# Patient Record
Sex: Male | Born: 1970 | Race: Black or African American | Hispanic: No | Marital: Married | Smoking: Former smoker
Health system: Southern US, Community
[De-identification: ages and names within clinical notes are randomized; demographics above are authoritative.]

## PROBLEM LIST (undated history)

## (undated) DIAGNOSIS — C801 Malignant (primary) neoplasm, unspecified: Secondary | ICD-10-CM

## (undated) DIAGNOSIS — Z9049 Acquired absence of other specified parts of digestive tract: Secondary | ICD-10-CM

## (undated) DIAGNOSIS — F329 Major depressive disorder, single episode, unspecified: Secondary | ICD-10-CM

## (undated) DIAGNOSIS — F32A Depression, unspecified: Secondary | ICD-10-CM

## (undated) DIAGNOSIS — D649 Anemia, unspecified: Secondary | ICD-10-CM

## (undated) DIAGNOSIS — C189 Malignant neoplasm of colon, unspecified: Secondary | ICD-10-CM

## (undated) HISTORY — PX: ABDOMINAL SURGERY: SHX537

## (undated) SURGERY — Surgical Case
Anesthesia: *Unknown

---

## 2002-02-07 ENCOUNTER — Emergency Department (HOSPITAL_COMMUNITY): Admission: EM | Admit: 2002-02-07 | Discharge: 2002-02-08 | Payer: Self-pay | Admitting: Emergency Medicine

## 2002-07-18 ENCOUNTER — Emergency Department (HOSPITAL_COMMUNITY): Admission: EM | Admit: 2002-07-18 | Discharge: 2002-07-19 | Payer: Self-pay

## 2012-06-14 ENCOUNTER — Inpatient Hospital Stay (HOSPITAL_COMMUNITY)
Admission: EM | Admit: 2012-06-14 | Discharge: 2012-06-25 | DRG: 331 | Disposition: A | Discharge status: Home / Self Care | Payer: Medicaid Other | Attending: Internal Medicine | Admitting: Internal Medicine

## 2012-06-14 ENCOUNTER — Emergency Department (HOSPITAL_COMMUNITY): Payer: Medicaid Other

## 2012-06-14 ENCOUNTER — Encounter (HOSPITAL_COMMUNITY): Payer: Self-pay | Admitting: Adult Health

## 2012-06-14 DIAGNOSIS — Z800 Family history of malignant neoplasm of digestive organs: Secondary | ICD-10-CM

## 2012-06-14 DIAGNOSIS — D630 Anemia in neoplastic disease: Secondary | ICD-10-CM | POA: Diagnosis present

## 2012-06-14 DIAGNOSIS — D509 Iron deficiency anemia, unspecified: Secondary | ICD-10-CM | POA: Diagnosis present

## 2012-06-14 DIAGNOSIS — C189 Malignant neoplasm of colon, unspecified: Secondary | ICD-10-CM

## 2012-06-14 DIAGNOSIS — J029 Acute pharyngitis, unspecified: Secondary | ICD-10-CM | POA: Diagnosis not present

## 2012-06-14 DIAGNOSIS — Z720 Tobacco use: Secondary | ICD-10-CM | POA: Diagnosis present

## 2012-06-14 DIAGNOSIS — K6389 Other specified diseases of intestine: Secondary | ICD-10-CM

## 2012-06-14 DIAGNOSIS — R911 Solitary pulmonary nodule: Secondary | ICD-10-CM | POA: Diagnosis present

## 2012-06-14 DIAGNOSIS — C182 Malignant neoplasm of ascending colon: Principal | ICD-10-CM | POA: Diagnosis present

## 2012-06-14 DIAGNOSIS — D649 Anemia, unspecified: Secondary | ICD-10-CM | POA: Diagnosis present

## 2012-06-14 DIAGNOSIS — E876 Hypokalemia: Secondary | ICD-10-CM | POA: Diagnosis not present

## 2012-06-14 DIAGNOSIS — F17200 Nicotine dependence, unspecified, uncomplicated: Secondary | ICD-10-CM | POA: Diagnosis present

## 2012-06-14 DIAGNOSIS — R0609 Other forms of dyspnea: Secondary | ICD-10-CM | POA: Diagnosis present

## 2012-06-14 HISTORY — DX: Anemia, unspecified: D64.9

## 2012-06-14 LAB — URINALYSIS, MICROSCOPIC ONLY
Bilirubin Urine: NEGATIVE
Hgb urine dipstick: NEGATIVE
Ketones, ur: 40 mg/dL — AB
Nitrite: NEGATIVE
Protein, ur: NEGATIVE mg/dL
Specific Gravity, Urine: 1.027 (ref 1.005–1.030)
Urobilinogen, UA: 1 mg/dL (ref 0.0–1.0)

## 2012-06-14 LAB — CBC WITH DIFFERENTIAL/PLATELET
Basophils Absolute: 0 10*3/uL (ref 0.0–0.1)
Eosinophils Absolute: 0.5 10*3/uL (ref 0.0–0.7)
HCT: 34.6 % — ABNORMAL LOW (ref 39.0–52.0)
Lymphs Abs: 1.6 10*3/uL (ref 0.7–4.0)
MCH: 20 pg — ABNORMAL LOW (ref 26.0–34.0)
MCHC: 30.6 g/dL (ref 30.0–36.0)
MCV: 65.3 fL — ABNORMAL LOW (ref 78.0–100.0)
Monocytes Absolute: 1 10*3/uL (ref 0.1–1.0)
Neutro Abs: 4.3 10*3/uL (ref 1.7–7.7)
Platelets: 438 10*3/uL — ABNORMAL HIGH (ref 150–400)
RDW: 17.3 % — ABNORMAL HIGH (ref 11.5–15.5)
WBC: 7.4 10*3/uL (ref 4.0–10.5)

## 2012-06-14 LAB — COMPREHENSIVE METABOLIC PANEL
ALT: 14 U/L (ref 0–53)
Albumin: 3.5 g/dL (ref 3.5–5.2)
Alkaline Phosphatase: 81 U/L (ref 39–117)
BUN: 12 mg/dL (ref 6–23)
Chloride: 98 mEq/L (ref 96–112)
Potassium: 3.5 mEq/L (ref 3.5–5.1)
Sodium: 135 mEq/L (ref 135–145)
Total Bilirubin: 0.3 mg/dL (ref 0.3–1.2)
Total Protein: 8 g/dL (ref 6.0–8.3)

## 2012-06-14 MED ORDER — ONDANSETRON HCL 4 MG/2ML IJ SOLN
4.0000 mg | Freq: Once | INTRAMUSCULAR | Status: AC
  Administered 2012-06-14: 4 mg via INTRAVENOUS
  Filled 2012-06-14: qty 2

## 2012-06-14 MED ORDER — SODIUM CHLORIDE 0.9 % IV SOLN
1000.0000 mL | Freq: Once | INTRAVENOUS | Status: AC
  Administered 2012-06-14: 1000 mL via INTRAVENOUS

## 2012-06-14 MED ORDER — IOHEXOL 300 MG/ML  SOLN
100.0000 mL | Freq: Once | INTRAMUSCULAR | Status: AC | PRN
  Administered 2012-06-14: 100 mL via INTRAVENOUS

## 2012-06-14 MED ORDER — HYDROMORPHONE HCL PF 1 MG/ML IJ SOLN
1.0000 mg | INTRAMUSCULAR | Status: DC | PRN
  Administered 2012-06-14: 1 mg via INTRAVENOUS
  Filled 2012-06-14: qty 1

## 2012-06-14 MED ORDER — SODIUM CHLORIDE 0.9 % IV SOLN
1000.0000 mL | INTRAVENOUS | Status: DC
  Administered 2012-06-14 – 2012-06-17 (×5): 1000 mL via INTRAVENOUS

## 2012-06-14 NOTE — ED Notes (Signed)
Returned from CT.

## 2012-06-14 NOTE — ED Provider Notes (Signed)
History     CSN: [id number] Arrival date & time 06/14/12  1911 First MD Initiated Contact with Patient 06/14/12 2000     Chief Complaint  Patient presents with  . Flank Pain    Patient is a 41 y.o. male presenting with flank pain. The history is provided by the patient.  Flank Pain This is a recurrent problem. Episode onset: one year ago. The problem occurs daily. The problem has been gradually worsening. Associated symptoms include abdominal pain. Pertinent negatives include no chest pain, no headaches and no shortness of breath. The symptoms are aggravated by eating (pasta, fried foods). Nothing relieves the symptoms. He has tried ASA for the symptoms. The treatment provided mild relief.  Pt has noticed decreased caliber of his stools and constipation.  He has not seen anyone for this problem.  History reviewed. No pertinent past medical history.  History reviewed. No pertinent past surgical history.  History reviewed. No pertinent family history.  History  Substance Use Topics  . Smoking status: Current Every Day Smoker  . Smokeless tobacco: Not on file  . Alcohol Use: No      Review of Systems  Respiratory: Negative for shortness of breath.   Cardiovascular: Negative for chest pain.  Gastrointestinal: Positive for abdominal pain and constipation. Negative for vomiting and diarrhea.  Genitourinary: Positive for flank pain.  Neurological: Negative for headaches.    Allergies  Review of patient's allergies indicates no known allergies.  Home Medications  No current outpatient prescriptions on file.  BP 122/82  Pulse 107  Temp 98.6 F (37 C) (Oral)  Resp 16  SpO2 99%  Physical Exam  Nursing note and vitals reviewed. Constitutional: He appears well-developed and well-nourished. No distress.  HENT:  Head: Normocephalic and atraumatic.  Right Ear: External ear normal.  Left Ear: External ear normal.  Eyes: Conjunctivae normal are normal. Right eye exhibits no  discharge. Left eye exhibits no discharge. No scleral icterus.  Neck: Neck supple. No tracheal deviation present.  Cardiovascular: Normal rate, regular rhythm and intact distal pulses.   Pulmonary/Chest: Effort normal and breath sounds normal. No stridor. No respiratory distress. He has no wheezes. He has no rales.  Abdominal: Soft. Bowel sounds are normal. He exhibits no distension, no ascites and no mass. There is tenderness. There is guarding. There is no rigidity and no rebound.  Musculoskeletal: He exhibits no edema and no tenderness.  Neurological: He is alert. He has normal strength. No sensory deficit. Cranial nerve deficit:  no gross defecits noted. He exhibits normal muscle tone. He displays no seizure activity. Coordination normal.  Skin: Skin is warm and dry. No rash noted.  Psychiatric: He has a normal mood and affect.    ED Course  Procedures (including critical care time)  Labs Reviewed  CBC WITH DIFFERENTIAL - Abnormal; Notable for the following:    Hemoglobin 10.6 (*)     HCT 34.6 (*)     MCV 65.3 (*)     MCH 20.0 (*)     RDW 17.3 (*)     Platelets 438 (*)     Monocytes Relative 13 (*)     Eosinophils Relative 7 (*)     All other components within normal limits  URINALYSIS, MICROSCOPIC ONLY - Abnormal; Notable for the following:    APPearance TURBID (*)     Ketones, ur 40 (*)     All other components within normal limits  LIPASE, BLOOD  COMPREHENSIVE METABOLIC PANEL   Ct Abdomen  Pelvis W Contrast  06/14/2012  *RADIOLOGY REPORT*  Clinical Data: Right abdominal pain  CT ABDOMEN AND PELVIS WITH CONTRAST  Technique:  Multidetector CT imaging of the abdomen and pelvis was performed following the standard protocol during bolus administration of intravenous contrast.  Contrast: OMNIPAQUE IOHEXOL 300 MG/ML  SOLN  Comparison: None.  Findings: Linear scarring/atelectasis in the bilateral lower lobes.  11 mm probable cyst in the posterior segment right hepatic lobe  (series 2/image 28).  Spleen, pancreas, and adrenal glands are within normal limits.  Gallbladder is unremarkable.  No intrahepatic or extrahepatic ductal dilatation.  Kidneys are within normal limits.  No hydronephrosis.  No evidence of bowel obstruction.  7.8 x 5.6 cm right colonic mass (series 2/image 43).  Suspected local extension into the surrounding pericolonic fat.  Associated matted ileocolic lymphadenopathy measuring up to 3.8 x 6.3 cm (series 2/image 47).  No evidence of abdominal aortic aneurysm.  No abdominopelvic ascites.  Prostate is top normal in size and indents the base of the bladder.  Bladder is unremarkable.  Mild degenerative changes of the visualized thoracolumbar spine.  IMPRESSION: 7.8 x 5.6 cm right colonic mass, compatible with primary colonic adenocarcinoma.  Suspected local extension into the surrounding pericolonic fat.  Associated ileocolic lymphadenopathy measuring up to 3.8 x 6.3 cm.  These results were called by telephone on 06/14/2012 at 1144 hrs to Dr. Linwood Dibbles, who verbally acknowledged these results.   Original Report Authenticated By: Charline Bills, M.D.      1. Colon cancer       MDM  Pt with unfortunate finding of colonic mass most likely colon ca.  He has no outpatient follow up.  Will admit for observation to arrange for pain management and further workup treatment of his probable colon ca.        Celene Kras, MD 06/14/12 [phone]

## 2012-06-14 NOTE — ED Notes (Signed)
CT notified pt finished drinking contrast  

## 2012-06-14 NOTE — ED Notes (Addendum)
Pt reports one year of right side pain that is located on right flank and around to right side and generalized abdominal pain reports constipation denies nausea, diarrhea. Denies difficulty urinating.  Pain reliever has made the pain better, certain foods make pain worse such as fried foods and pasta.

## 2012-06-15 ENCOUNTER — Observation Stay (HOSPITAL_COMMUNITY): Payer: Medicaid Other

## 2012-06-15 ENCOUNTER — Encounter (HOSPITAL_COMMUNITY): Payer: Self-pay | Admitting: Internal Medicine

## 2012-06-15 DIAGNOSIS — F17200 Nicotine dependence, unspecified, uncomplicated: Secondary | ICD-10-CM

## 2012-06-15 DIAGNOSIS — C182 Malignant neoplasm of ascending colon: Secondary | ICD-10-CM | POA: Diagnosis present

## 2012-06-15 DIAGNOSIS — D649 Anemia, unspecified: Secondary | ICD-10-CM | POA: Diagnosis present

## 2012-06-15 DIAGNOSIS — C189 Malignant neoplasm of colon, unspecified: Secondary | ICD-10-CM

## 2012-06-15 DIAGNOSIS — Z720 Tobacco use: Secondary | ICD-10-CM | POA: Diagnosis present

## 2012-06-15 DIAGNOSIS — K6389 Other specified diseases of intestine: Secondary | ICD-10-CM

## 2012-06-15 DIAGNOSIS — I428 Other cardiomyopathies: Secondary | ICD-10-CM

## 2012-06-15 DIAGNOSIS — R0609 Other forms of dyspnea: Secondary | ICD-10-CM | POA: Diagnosis present

## 2012-06-15 LAB — CEA: CEA: 77.4 ng/mL — ABNORMAL HIGH (ref 0.0–5.0)

## 2012-06-15 LAB — OCCULT BLOOD X 1 CARD TO LAB, STOOL
Fecal Occult Bld: NEGATIVE
Fecal Occult Bld: NEGATIVE

## 2012-06-15 LAB — COMPREHENSIVE METABOLIC PANEL
Albumin: 2.9 g/dL — ABNORMAL LOW (ref 3.5–5.2)
BUN: 10 mg/dL (ref 6–23)
Chloride: 102 mEq/L (ref 96–112)
Creatinine, Ser: 0.9 mg/dL (ref 0.50–1.35)
GFR calc Af Amer: >90 mL/min (ref >90)
GFR calc non Af Amer: >90 mL/min (ref >90)
Glucose, Bld: 89 mg/dL (ref 70–99)
Total Bilirubin: 0.3 mg/dL (ref 0.3–1.2)

## 2012-06-15 LAB — PHOSPHORUS: Phosphorus: 3.5 mg/dL (ref 2.3–4.6)

## 2012-06-15 LAB — RETICULOCYTES: Retic Count, Absolute: 39.5 10*3/uL (ref 19.0–186.0)

## 2012-06-15 LAB — CBC
MCV: 65 fL — ABNORMAL LOW (ref 78.0–100.0)
Platelets: 443 10*3/uL — ABNORMAL HIGH (ref 150–400)
RBC: 4.94 MIL/uL (ref 4.22–5.81)
RDW: 17.4 % — ABNORMAL HIGH (ref 11.5–15.5)
WBC: 7 10*3/uL (ref 4.0–10.5)

## 2012-06-15 LAB — IRON AND TIBC
Iron: 13 ug/dL — ABNORMAL LOW (ref 42–135)
TIBC: 273 ug/dL (ref 215–435)

## 2012-06-15 LAB — FOLATE: Folate: 19.9 ng/mL

## 2012-06-15 LAB — D-DIMER, QUANTITATIVE: D-Dimer, Quant: 2.51 ug/mL-FEU — ABNORMAL HIGH (ref 0.00–0.48)

## 2012-06-15 LAB — VITAMIN B12: Vitamin B-12: 524 pg/mL (ref 211–911)

## 2012-06-15 MED ORDER — ENOXAPARIN SODIUM 40 MG/0.4ML ~~LOC~~ SOLN
40.0000 mg | SUBCUTANEOUS | Status: DC
  Filled 2012-06-15 (×6): qty 0.4

## 2012-06-15 MED ORDER — IOHEXOL 350 MG/ML SOLN
80.0000 mL | Freq: Once | INTRAVENOUS | Status: AC | PRN
  Administered 2012-06-15: 80 mL via INTRAVENOUS

## 2012-06-15 MED ORDER — PEG 3350-KCL-NA BICARB-NACL 420 G PO SOLR
4000.0000 mL | Freq: Once | ORAL | Status: AC
  Administered 2012-06-15: 4000 mL via ORAL
  Filled 2012-06-15: qty 4000

## 2012-06-15 MED ORDER — ONDANSETRON HCL 4 MG/2ML IJ SOLN: 4.0000 mg | Freq: Three times a day (TID) | INTRAMUSCULAR | Status: AC | PRN

## 2012-06-15 MED ORDER — HYDROMORPHONE HCL PF 1 MG/ML IJ SOLN
1.0000 mg | INTRAMUSCULAR | Status: DC | PRN
Start: 2012-06-15 — End: 2012-06-15
  Administered 2012-06-15 (×2): 1 mg via INTRAVENOUS
  Filled 2012-06-15 (×2): qty 1

## 2012-06-15 MED ORDER — ONDANSETRON HCL 4 MG/2ML IJ SOLN: 4.0000 mg | Freq: Four times a day (QID) | INTRAMUSCULAR | Status: DC | PRN

## 2012-06-15 MED ORDER — HYDROCODONE-ACETAMINOPHEN 5-325 MG PO TABS
1.0000 | ORAL_TABLET | ORAL | Status: DC | PRN
  Administered 2012-06-15 – 2012-06-16 (×4): 1 via ORAL
  Administered 2012-06-18: 2 via ORAL
  Filled 2012-06-15 (×2): qty 1
  Filled 2012-06-15: qty 2
  Filled 2012-06-15: qty 1
  Filled 2012-06-15: qty 2

## 2012-06-15 MED ORDER — DOCUSATE SODIUM 100 MG PO CAPS
100.0000 mg | ORAL_CAPSULE | Freq: Two times a day (BID) | ORAL | Status: DC
  Administered 2012-06-15 – 2012-06-16 (×3): 100 mg via ORAL
  Filled 2012-06-15 (×4): qty 1

## 2012-06-15 MED ORDER — ZOLPIDEM TARTRATE 5 MG PO TABS
5.0000 mg | ORAL_TABLET | Freq: Every evening | ORAL | Status: DC | PRN
  Administered 2012-06-18 (×2): 5 mg via ORAL
  Filled 2012-06-15 (×2): qty 1

## 2012-06-15 MED ORDER — HYDROMORPHONE HCL PF 1 MG/ML IJ SOLN: 0.5000 mg | INTRAMUSCULAR | Status: DC | PRN

## 2012-06-15 MED ORDER — ONDANSETRON HCL 4 MG PO TABS
4.0000 mg | ORAL_TABLET | Freq: Four times a day (QID) | ORAL | Status: DC | PRN
  Administered 2012-06-15 – 2012-06-16 (×2): 4 mg via ORAL
  Filled 2012-06-15 (×2): qty 1

## 2012-06-15 MED ORDER — PANTOPRAZOLE SODIUM 40 MG PO TBEC
40.0000 mg | DELAYED_RELEASE_TABLET | Freq: Every day | ORAL | Status: DC
  Administered 2012-06-15 – 2012-06-18 (×4): 40 mg via ORAL
  Filled 2012-06-15 (×4): qty 1

## 2012-06-15 MED ORDER — ACETAMINOPHEN 325 MG PO TABS: 650.0000 mg | ORAL_TABLET | Freq: Four times a day (QID) | ORAL | Status: DC | PRN

## 2012-06-15 MED ORDER — NICOTINE 14 MG/24HR TD PT24
14.0000 mg | MEDICATED_PATCH | Freq: Every day | TRANSDERMAL | Status: DC
  Administered 2012-06-15 – 2012-06-25 (×10): 14 mg via TRANSDERMAL
  Filled 2012-06-15 (×11): qty 1

## 2012-06-15 MED ORDER — ACETAMINOPHEN 650 MG RE SUPP: 650.0000 mg | Freq: Four times a day (QID) | RECTAL | Status: DC | PRN

## 2012-06-15 NOTE — Progress Notes (Signed)
  Echocardiogram 2D Echocardiogram has been performed.  Georgian Co 06/15/2012, 2:45 PM

## 2012-06-15 NOTE — H&P (Signed)
PCP:  NONE   Chief Complaint:   Abdominal pain fatigue  HPI: David Conrad is a 41 y.o. male   has no past medical history on file.   Presented with  1 year hx of intermittent abdominal worse after eating. He also noted thinner stools. He has had fatigue and some dyspnea on exertion. No chest pain. NO blood in stool. What brought him into ER was severe fatigue and worsening abdominal pain. He denies any nausea vomiting. Patient's denying any family history of colonic cancer.  Review of Systems:     Pertinent positives include: fatigue, weight loss abdominal pain,  Constitutional:  No weight loss, night sweats, Fevers, chills,  HEENT:  No headaches, Difficulty swallowing,Tooth/dental problems,Sore throat,  No sneezing, itching, ear ache, nasal congestion, post nasal drip,  Cardio-vascular:  No chest pain, Orthopnea, PND, anasarca, dizziness, palpitations.no Bilateral lower extremity swelling  GI:  No heartburn, indigestion,  nausea, vomiting, diarrhea, change in bowel habits, loss of appetite, melena, blood in stool, hematemesis Resp:  no shortness of breath at rest. No dyspnea on exertion, No excess mucus, no productive cough, No non-productive cough, No coughing up of blood.No change in color of mucus.No wheezing. Skin:  no rash or lesions. No jaundice GU:  no dysuria, change in color of urine, no urgency or frequency. No straining to urinate.  No flank pain.  Musculoskeletal:  No joint pain or no joint swelling. No decreased range of motion. No back pain.  Psych:  No change in mood or affect. No depression or anxiety. No memory loss.  Neuro: no localizing neurological complaints, no tingling, no weakness, no double vision, no gait abnormality, no slurred speech, no confusion  Otherwise ROS are negative except for above, 10 systems were reviewed  Past Medical History: History reviewed. No pertinent past medical history. History reviewed. No pertinent past surgical  history.   Medications: Prior to Admission medications   Not on File    Allergies:  No Known Allergies  Social History:  Ambulatory independently   Lives at   Home with 2 young children his wife is currently pregnant with their third.    reports that he has been smoking.  He does not have any smokeless tobacco history on file. He reports that he does not drink alcohol or use illicit drugs.   Family History: family history includes Alcohol abuse in his father.    Physical Exam: Patient Vitals for the past 24 hrs:  BP Temp Temp src Pulse Resp SpO2  06/14/12 2245 144/69 mmHg - - 88  - 99 %  06/14/12 2230 108/63 mmHg - - 82  - 100 %  06/14/12 2215 120/70 mmHg - - 81  - 100 %  06/14/12 2200 106/62 mmHg - - 85  - 100 %  06/14/12 2145 128/84 mmHg - - 91  - 99 %  06/14/12 2130 121/77 mmHg - - 86  - 99 %  06/14/12 2115 124/76 mmHg - - 86  - 99 %  06/14/12 2100 127/69 mmHg - - 84  - 99 %  06/14/12 1936 122/82 mmHg 98.6 F (37 C) Oral 107  16  99 %    1. General:  in No Acute distress but tearful 2. Psychological: Alert and   Oriented 3. Head/ENT:    Dry Mucous Membranes                          Head Non traumatic, neck supple  Normal Dentition 4. SKIN: normal   Skin turgor,  Skin clean Dry and intact no rash 5. Heart: Regular rate and rhythm no Murmur, Rub or gallop 6. Lungs:  no wheezes bibasilar crackles  noted 7. Abdomen: Soft, and right upper quadrant tenderness and some fullness, Non distended 8. Lower extremities: no clubbing, cyanosis, or edema 9. Neurologically Grossly intact, moving all 4 extremities equally 10. MSK: Normal range of motion  body mass index is unknown because there is no height or weight on file.   Labs on Admission:   [facility] 06/14/12 2018  NA 135  K 3.5  CL 98  CO2 26  GLUCOSE 94  BUN 12  CREATININE 0.95  CALCIUM 9.7  MG --  PHOS --    Basename 06/14/12 2018  AST 14  ALT 14  ALKPHOS 81  BILITOT 0.3    PROT 8.0  ALBUMIN 3.5    Basename 06/14/12 2018  LIPASE 17  AMYLASE --    Basename 06/14/12 2018  WBC 7.4  NEUTROABS 4.3  HGB 10.6*  HCT 34.6*  MCV 65.3*  PLT 438*   No results found for this basename: CKTOTAL:3,CKMB:3,CKMBINDEX:3,TROPONINI:3 in the last 72 hours No results found for this basename: TSH,T4TOTAL,FREET3,T3FREE,THYROIDAB in the last 72 hours No results found for this basename: VITAMINB12:2,FOLATE:2,FERRITIN:2,TIBC:2,IRON:2,RETICCTPCT:2 in the last 72 hours No results found for this basename: HGBA1C    CrCl is unknown because there is no height on file for the current visit. ABG No results found for this basename: phart, pco2, po2, hco3, tco2, acidbasedef, o2sat     No results found for this basename: DDIMER     UA no evidence of infection  Cultures: No results found for this basename: sdes, specrequest, cult, reptstatus       Radiological Exams on Admission: Ct Abdomen Pelvis W Contrast  06/14/2012  *RADIOLOGY REPORT*  Clinical Data: Right abdominal pain  CT ABDOMEN AND PELVIS WITH CONTRAST  Technique:  Multidetector CT imaging of the abdomen and pelvis was performed following the standard protocol during bolus administration of intravenous contrast.  Contrast: OMNIPAQUE IOHEXOL 300 MG/ML  SOLN  Comparison: None.  Findings: Linear scarring/atelectasis in the bilateral lower lobes.  11 mm probable cyst in the posterior segment right hepatic lobe (series 2/image 28).  Spleen, pancreas, and adrenal glands are within normal limits.  Gallbladder is unremarkable.  No intrahepatic or extrahepatic ductal dilatation.  Kidneys are within normal limits.  No hydronephrosis.  No evidence of bowel obstruction.  7.8 x 5.6 cm right colonic mass (series 2/image 43).  Suspected local extension into the surrounding pericolonic fat.  Associated matted ileocolic lymphadenopathy measuring up to 3.8 x 6.3 cm (series 2/image 47).  No evidence of abdominal aortic aneurysm.  No  abdominopelvic ascites.  Prostate is top normal in size and indents the base of the bladder.  Bladder is unremarkable.  Mild degenerative changes of the visualized thoracolumbar spine.  IMPRESSION: 7.8 x 5.6 cm right colonic mass, compatible with primary colonic adenocarcinoma.  Suspected local extension into the surrounding pericolonic fat.  Associated ileocolic lymphadenopathy measuring up to 3.8 x 6.3 cm.  These results were called by telephone on 06/14/2012 at 1144 hrs to Dr. Linwood Dibbles, who verbally acknowledged these results.   Original Report Authenticated By: Charline Bills, M.D.     Chart has been reviewed  Assessment/Plan  41 year old gentleman with pneumonia diagnosed likely primary colonic adenocarcinoma by CAT scan is being admitted to father workup.  Present on Admission:  .Colonic mass -  we'll admit for observation would recommend GI consult in a.m. keep clear liquid diet for now. Tissue biopsy would be prudent to obtain.  .Anemia - likely secondary to anemia of chronic disease versus iron deficiency anemia secondary to slow blood loss to obtain anemia panel and Hemoccult stool  .Dyspnea on exertion - possibly secondary to anemia but will also obtain  chest films. Denies any chest pain which would suggest PE. No leg swelling. Patient is not hypoxic or tachycardic.   Prophylaxis:  Lovenox, Protonix  CODE STATUS: FULL CODE  Other plan as per orders.  I have spent a total of 55 min on this admission  David Conrad 06/15/2012, 12:44 AM

## 2012-06-15 NOTE — Consult Note (Signed)
Reason for Consult:Right colon mass on CT. Referring Physician: [facility]-Dr. Ricke Hey  David Conrad is an 41 y.o. male.  HPI: Patient gives a 1 year history of anorexia with abdominal pain, change in bowel habits and weight loss. His symptoms were intermittent at first but worsened over the last few months. He claims he weighs about 220 lbs at his baseline but no one has weighed him since admission. He denies having any melena or hematochezia. CT scan on admission revealed a large right colon mass and lymphadenopathy.    History reviewed. No pertinent past medical history.  History reviewed. No pertinent past surgical history.  Family History  Problem Relation Age of Onset  . Alcohol abuse Father   His paternal grandfather was diagnosed with colon cancer at the age of 76.   Social History:  reports that he has been smoking.  He has never used smokeless tobacco. He reports that he does not drink alcohol or use illicit drugs.  Allergies: No Known Allergies  Medications: I have reviewed the patient's current medications.  Results for orders placed during the hospital encounter of 06/14/12 (from the past 48 hour(s))  LIPASE, BLOOD     Status: Normal   Collection Time   06/14/12  8:18 PM      Component Value Range Comment   Lipase 17  11 - 59 U/L   COMPREHENSIVE METABOLIC PANEL     Status: Normal   Collection Time   06/14/12  8:18 PM      Component Value Range Comment   Sodium 135  135 - 145 mEq/L    Potassium 3.5  3.5 - 5.1 mEq/L    Chloride 98  96 - 112 mEq/L    CO2 26  19 - 32 mEq/L    Glucose, Bld 94  70 - 99 mg/dL    BUN 12  6 - 23 mg/dL    Creatinine, Ser 4.78  0.50 - 1.35 mg/dL    Calcium 9.7  8.4 - 29.5 mg/dL    Total Protein 8.0  6.0 - 8.3 g/dL    Albumin 3.5  3.5 - 5.2 g/dL    AST 14  0 - 37 U/L    ALT 14  0 - 53 U/L    Alkaline Phosphatase 81  39 - 117 U/L    Total Bilirubin 0.3  0.3 - 1.2 mg/dL    GFR calc non Af Amer >90  >90 mL/min    GFR calc Af Amer >90  >90  mL/min   CBC WITH DIFFERENTIAL     Status: Abnormal   Collection Time   06/14/12  8:18 PM      Component Value Range Comment   WBC 7.4  4.0 - 10.5 K/uL    RBC 5.30  4.22 - 5.81 MIL/uL    Hemoglobin 10.6 (*) 13.0 - 17.0 g/dL    HCT 62.1 (*) 30.8 - 52.0 %    MCV 65.3 (*) 78.0 - 100.0 fL    MCH 20.0 (*) 26.0 - 34.0 pg    MCHC 30.6  30.0 - 36.0 g/dL    RDW 65.7 (*) 84.6 - 15.5 %    Platelets 438 (*) 150 - 400 K/uL    Neutrophils Relative 58  43 - 77 %    Lymphocytes Relative 22  12 - 46 %    Monocytes Relative 13 (*) 3 - 12 %    Eosinophils Relative 7 (*) 0 - 5 %    Basophils Relative  0  0 - 1 %    Neutro Abs 4.3  1.7 - 7.7 K/uL    Lymphs Abs 1.6  0.7 - 4.0 K/uL    Monocytes Absolute 1.0  0.1 - 1.0 K/uL    Eosinophils Absolute 0.5  0.0 - 0.7 K/uL    Basophils Absolute 0.0  0.0 - 0.1 K/uL    RBC Morphology POLYCHROMASIA PRESENT     URINALYSIS, MICROSCOPIC ONLY     Status: Abnormal   Collection Time   06/14/12 10:50 PM      Component Value Range Comment   Color, Urine YELLOW  YELLOW    APPearance TURBID (*) CLEAR    Specific Gravity, Urine 1.027  1.005 - 1.030    pH 5.5  5.0 - 8.0    Glucose, UA NEGATIVE  NEGATIVE mg/dL    Hgb urine dipstick NEGATIVE  NEGATIVE    Bilirubin Urine NEGATIVE  NEGATIVE    Ketones, ur 40 (*) NEGATIVE mg/dL    Protein, ur NEGATIVE  NEGATIVE mg/dL    Urobilinogen, UA 1.0  0.0 - 1.0 mg/dL    Nitrite NEGATIVE  NEGATIVE    Leukocytes, UA NEGATIVE  NEGATIVE    WBC, UA 0-2  <3 WBC/hpf    RBC / HPF 0-2  <3 RBC/hpf    Bacteria, UA RARE  RARE    Urine-Other MUCOUS PRESENT     MAGNESIUM     Status: Normal   Collection Time   06/15/12  5:00 AM      Component Value Range Comment   Magnesium 2.0  1.5 - 2.5 mg/dL   PHOSPHORUS     Status: Normal   Collection Time   06/15/12  5:00 AM      Component Value Range Comment   Phosphorus 3.5  2.3 - 4.6 mg/dL   TSH     Status: Normal   Collection Time   06/15/12  5:00 AM      Component Value Range Comment   TSH  3.655  0.350 - 4.500 uIU/mL   COMPREHENSIVE METABOLIC PANEL     Status: Abnormal   Collection Time   06/15/12  5:00 AM      Component Value Range Comment   Sodium 138  135 - 145 mEq/L    Potassium 3.9  3.5 - 5.1 mEq/L    Chloride 102  96 - 112 mEq/L    CO2 25  19 - 32 mEq/L    Glucose, Bld 89  70 - 99 mg/dL    BUN 10  6 - 23 mg/dL    Creatinine, Ser 1.61  0.50 - 1.35 mg/dL    Calcium 9.3  8.4 - 09.6 mg/dL    Total Protein 7.1  6.0 - 8.3 g/dL    Albumin 2.9 (*) 3.5 - 5.2 g/dL    AST 13  0 - 37 U/L    ALT 11  0 - 53 U/L    Alkaline Phosphatase 72  39 - 117 U/L    Total Bilirubin 0.3  0.3 - 1.2 mg/dL    GFR calc non Af Amer >90  >90 mL/min    GFR calc Af Amer >90  >90 mL/min   CBC     Status: Abnormal   Collection Time   06/15/12  5:00 AM      Component Value Range Comment   WBC 7.0  4.0 - 10.5 K/uL    RBC 4.94  4.22 - 5.81 MIL/uL    Hemoglobin 10.0 (*)  13.0 - 17.0 g/dL    HCT 16.1 (*) 09.6 - 52.0 %    MCV 65.0 (*) 78.0 - 100.0 fL    MCH 20.2 (*) 26.0 - 34.0 pg    MCHC 31.2  30.0 - 36.0 g/dL    RDW 04.5 (*) 40.9 - 15.5 %    Platelets 443 (*) 150 - 400 K/uL   VITAMIN B12     Status: Normal   Collection Time   06/15/12  5:00 AM      Component Value Range Comment   Vitamin B-12 524  211 - 911 pg/mL   FOLATE     Status: Normal   Collection Time   06/15/12  5:00 AM      Component Value Range Comment   Folate 19.9     IRON AND TIBC     Status: Abnormal   Collection Time   06/15/12  5:00 AM      Component Value Range Comment   Iron 13 (*) 42 - 135 ug/dL    TIBC 811  914 - 782 ug/dL    Saturation Ratios 5 (*) 20 - 55 %    UIBC 260  125 - 400 ug/dL   FERRITIN     Status: Normal   Collection Time   06/15/12  5:00 AM      Component Value Range Comment   Ferritin 22  22 - 322 ng/mL   RETICULOCYTES     Status: Normal   Collection Time   06/15/12  5:00 AM      Component Value Range Comment   Retic Ct Pct 0.8  0.4 - 3.1 %    RBC. 4.94  4.22 - 5.81 MIL/uL    Retic Count,  Manual 39.5  19.0 - 186.0 K/uL   CEA     Status: Abnormal   Collection Time   06/15/12  9:28 AM      Component Value Range Comment   CEA 77.4 (*) 0.0 - 5.0 ng/mL   D-DIMER, QUANTITATIVE     Status: Abnormal   Collection Time   06/15/12  9:28 AM      Component Value Range Comment   D-Dimer, Quant 2.51 (*) 0.00 - 0.48 ug/mL-FEU   OCCULT BLOOD X 1 CARD TO LAB, STOOL     Status: Normal   Collection Time   06/15/12 11:21 AM      Component Value Range Comment   Fecal Occult Bld NEGATIVE     OCCULT BLOOD X 1 CARD TO LAB, STOOL     Status: Normal   Collection Time   06/15/12  4:19 PM      Component Value Range Comment   Fecal Occult Bld NEGATIVE       Dg Chest 2 View  06/15/2012  *RADIOLOGY REPORT*  Clinical Data: Shortness of breath.  CHEST - 2 VIEW  Comparison: None.  Findings: Shallow inspiration.  Normal heart size and pulmonary vascularity.  Infiltration or atelectasis in the left lung base. No blunting of costophrenic angles.  No pneumothorax.  Mediastinal contours appear intact.  IMPRESSION: Shallow inspiration with infiltration or atelectasis in the left lung base.   Original Report Authenticated By: Marlon Pel, M.D.    Ct Angio Chest Pe W/cm &/or Wo Cm  06/15/2012  *RADIOLOGY REPORT*  Clinical Data: Dyspnea on exertion, elevated D-dimer  CT ANGIOGRAPHY CHEST  Technique:  Multidetector CT imaging of the chest using the standard protocol during bolus administration of intravenous contrast. Multiplanar reconstructed images  including MIPs were obtained and reviewed to evaluate the vascular anatomy.  Contrast:  80 ml Omnipaque-300 IV  Comparison: Chest radiographs dated 06/15/2012  Findings: No evidence of pulmonary embolism.  Linear scarring/atelectasis in the bilateral lower lobes.   3 mm nodule along the right major fissure (series 5/image 38).  No pleural effusion or pneumothorax.  Visualized thyroid is unremarkable.  The heart is normal in size.  No pericardial effusion.  No  suspicious mediastinal, hilar, or axillary lymphadenopathy.  Visualized upper abdomen is unremarkable.  Visualized osseous structures are within normal limits.  IMPRESSION: No evidence of pulmonary embolism.  No evidence of acute cardiopulmonary disease.  3 mm nodule along the right major fissure, likely benign.  In the setting of suspected colonic malignancy, attention on follow-up is suggested.   Original Report Authenticated By: Charline Bills, M.D.    Ct Abdomen Pelvis W Contrast  06/14/2012  *RADIOLOGY REPORT*  Clinical Data: Right abdominal pain  CT ABDOMEN AND PELVIS WITH CONTRAST  Technique:  Multidetector CT imaging of the abdomen and pelvis was performed following the standard protocol during bolus administration of intravenous contrast.  Contrast: OMNIPAQUE IOHEXOL 300 MG/ML  SOLN  Comparison: None.  Findings: Linear scarring/atelectasis in the bilateral lower lobes.  11 mm probable cyst in the posterior segment right hepatic lobe (series 2/image 28).  Spleen, pancreas, and adrenal glands are within normal limits.  Gallbladder is unremarkable.  No intrahepatic or extrahepatic ductal dilatation.  Kidneys are within normal limits.  No hydronephrosis.  No evidence of bowel obstruction.  7.8 x 5.6 cm right colonic mass (series 2/image 43).  Suspected local extension into the surrounding pericolonic fat.  Associated matted ileocolic lymphadenopathy measuring up to 3.8 x 6.3 cm (series 2/image 47).  No evidence of abdominal aortic aneurysm.  No abdominopelvic ascites.  Prostate is top normal in size and indents the base of the bladder.  Bladder is unremarkable.  Mild degenerative changes of the visualized thoracolumbar spine.  IMPRESSION: 7.8 x 5.6 cm right colonic mass, compatible with primary colonic adenocarcinoma.  Suspected local extension into the surrounding pericolonic fat.  Associated ileocolic lymphadenopathy measuring up to 3.8 x 6.3 cm.  These results were called by telephone on  06/14/2012 at 1144 hrs to Dr. Linwood Dibbles, who verbally acknowledged these results.   Original Report Authenticated By: Charline Bills, M.D.     Review of Systems  Constitutional: Positive for weight loss and malaise/fatigue. Negative for fever, chills and diaphoresis.  HENT: Negative.   Eyes: Negative.   Respiratory: Positive for shortness of breath. Negative for cough, hemoptysis, sputum production and wheezing.   Cardiovascular: Negative for chest pain, palpitations, orthopnea, claudication, leg swelling and PND.  Gastrointestinal: Positive for abdominal pain. Negative for heartburn, nausea, vomiting, blood in stool and melena.  Genitourinary: Negative.   Musculoskeletal: Negative.   Skin: Negative.   Neurological: Positive for weakness.  Psychiatric/Behavioral: Negative.    Blood pressure 117/70, pulse 88, temperature 97.7 F (36.5 C), temperature source Oral, resp. rate 18, height 5\' 7"  (1.702 m), weight 79.3 kg (174 lb 13.2 oz), SpO2 99.00%. Physical Exam  Constitutional: He is oriented to person, place, and time. He appears well-developed and well-nourished.  HENT:  Head: Normocephalic and atraumatic.  Eyes: Conjunctivae normal and EOM are normal. Pupils are equal, round, and reactive to light.  Neck: Normal range of motion. Neck supple.  Cardiovascular: Normal rate and regular rhythm.   Respiratory: Effort normal and breath sounds normal.  GI: Soft. He exhibits no  mass. There is tenderness. There is guarding. There is no rebound.  Musculoskeletal: Normal range of motion.  Neurological: He is alert and oriented to person, place, and time.  Skin: Skin is warm and dry.  Psychiatric: He has a normal mood and affect. His behavior is normal. Judgment and thought content normal.    Assessment/Plan: 1) Severe iron deficiency anemia with a mass on CT and an elevated CEA at 77.4 ng/ml is very worrisome for metastatic colon cancer. A colonoscopy is planned for tomorrow. Will prep  tonight.  David Conrad 06/15/2012, 5:54 PM

## 2012-06-15 NOTE — ED Notes (Signed)
Returned from x-ray.  Pt transported to floor by Candise Bowens, EMT.

## 2012-06-15 NOTE — Progress Notes (Signed)
[facility] PROGRESS NOTE  David Conrad ZOX:[id number] DOB: 1971-03-10 DOA: 06/14/2012 PCP: Sheila Oats, MD  Assessment/Plan: Principal Problem:  *Colonic mass Active Problems:  Anemia  Dyspnea on exertion    1. Colonic mass: Patient presented with gradually progressive abdominal pain, change in bowel habit and fatigue. Abdominal/pelvic CT demonstrated a 7.8 x 5.6 cm right colonic mass, compatible with primary colonic adenocarcinoma, and suspected local extension into the surrounding pericolonic fat. There was associated ileocolic lymphadenopathy measuring up to 3.8 x 6.3 cm. GI consultation has been requested for endoscopic evaluation and biopsy. Tumor markers are pending. Surgical consultation will be needed in due course.  2. Anemia: This is mild, microcytic, likely due to chronic disease versus iron deficiency anemia from occult GI blood loss. Anemia panel and FOBT are pending.  3. Dyspnea on exertion: Possibly secondary to anemia. CXR Shows possible atelectasis vs infiltrate left base. Patient is apyrexial, wcc is normal, ad he has no respiratory tract symptoms. Patient is not hypoxic or tachycardic. Will check D-Dimer and do 2D Echocardiogram.  4. Tobacco abuse: Patient smokes about a half-pack of cigarettes per day. He has been counseled appropriately, and placed on Nicoderm CQ.   Code Status: Full Code.  Family Communication:  Disposition Plan: To be determined.    Brief narrative: 41 y.o. male smoker, presenting with 1 year history of intermittent abdominal pain, worse after eating, thinner stools, fatigue and dyspnea on exertion. He came to ED because of worsening symptoms, and abdominal imaging revealed a colonic mass. His grandfather had colonic cancer at age 40 rears. .   Consultants:  GI.  Procedures:  Abdominal/Pelvic CT scan  Antibiotics:  N/A.  HPI/Subjective: No new issues.   Objective: Vital signs in last 24 hours: Temp:  [98.4 F (36.9  C)-98.8 F (37.1 C)] 98.8 F (37.1 C) (10/26 0508) Pulse Rate:  [81-107] 88  (10/26 0508) Resp:  [16-20] 18  (10/26 0508) BP: (105-144)/(62-84) 105/66 mmHg (10/26 0508) SpO2:  [98 %-100 %] 98 % (10/26 0508) Weight:  [79.3 kg (174 lb 13.2 oz)] 79.3 kg (174 lb 13.2 oz) (10/26 0248) Weight change:  Last BM Date: 06/14/12  Intake/Output from previous day:       Physical Exam: General: Comfortable, alert, communicative, fully oriented, not short of breath at rest.  HEENT:  Mild clinical pallor, no jaundice, no conjunctival injection or discharge. NECK:  Supple, JVP not seen, no carotid bruits, no palpable lymphadenopathy, no palpable goiter. CHEST:  Clinically clear to auscultation, no wheezes, no crackles. HEART:  Sounds 1 and 2 heard, normal, regular, no murmurs. ABDOMEN:  Full, soft, non-tender, no palpable organomegaly, no palpable masses, normal bowel sounds. GENITALIA:  Not examined. LOWER EXTREMITIES:  No pitting edema, palpable peripheral pulses. MUSCULOSKELETAL SYSTEM:  Unremarkable. CENTRAL NERVOUS SYSTEM:  No focal neurologic deficit on gross examination.  Lab Results:  Basename 06/15/12 0500 06/14/12 2018  WBC 7.0 7.4  HGB 10.0* 10.6*  HCT 32.1* 34.6*  PLT 443* 438*    Basename 06/15/12 0500 06/14/12 2018  NA 138 135  K 3.9 3.5  CL 102 98  CO2 25 26  GLUCOSE 89 94  BUN 10 12  CREATININE 0.90 0.95  CALCIUM 9.3 9.7   No results found for this or any previous visit (from the past 240 hour(s)).   Studies/Results: Dg Chest 2 View  06/15/2012  *RADIOLOGY REPORT*  Clinical Data: Shortness of breath.  CHEST - 2 VIEW  Comparison: None.  Findings: Shallow inspiration.  Normal heart size and pulmonary  vascularity.  Infiltration or atelectasis in the left lung base. No blunting of costophrenic angles.  No pneumothorax.  Mediastinal contours appear intact.  IMPRESSION: Shallow inspiration with infiltration or atelectasis in the left lung base.   Original Report  Authenticated By: Marlon Pel, M.D.    Ct Abdomen Pelvis W Contrast  06/14/2012  *RADIOLOGY REPORT*  Clinical Data: Right abdominal pain  CT ABDOMEN AND PELVIS WITH CONTRAST  Technique:  Multidetector CT imaging of the abdomen and pelvis was performed following the standard protocol during bolus administration of intravenous contrast.  Contrast: OMNIPAQUE IOHEXOL 300 MG/ML  SOLN  Comparison: None.  Findings: Linear scarring/atelectasis in the bilateral lower lobes.  11 mm probable cyst in the posterior segment right hepatic lobe (series 2/image 28).  Spleen, pancreas, and adrenal glands are within normal limits.  Gallbladder is unremarkable.  No intrahepatic or extrahepatic ductal dilatation.  Kidneys are within normal limits.  No hydronephrosis.  No evidence of bowel obstruction.  7.8 x 5.6 cm right colonic mass (series 2/image 43).  Suspected local extension into the surrounding pericolonic fat.  Associated matted ileocolic lymphadenopathy measuring up to 3.8 x 6.3 cm (series 2/image 47).  No evidence of abdominal aortic aneurysm.  No abdominopelvic ascites.  Prostate is top normal in size and indents the base of the bladder.  Bladder is unremarkable.  Mild degenerative changes of the visualized thoracolumbar spine.  IMPRESSION: 7.8 x 5.6 cm right colonic mass, compatible with primary colonic adenocarcinoma.  Suspected local extension into the surrounding pericolonic fat.  Associated ileocolic lymphadenopathy measuring up to 3.8 x 6.3 cm.  These results were called by telephone on 06/14/2012 at 1144 hrs to Dr. Linwood Dibbles, who verbally acknowledged these results.   Original Report Authenticated By: Charline Bills, M.D.     Medications: Scheduled Meds:   . sodium chloride  1,000 mL Intravenous Once  . docusate sodium  100 mg Oral BID  . enoxaparin (LOVENOX) injection  40 mg Subcutaneous Q24H  . ondansetron  4 mg Intravenous Once  . pantoprazole  40 mg Oral Q1200   Continuous Infusions:     . sodium chloride 1,000 mL (06/14/12 2127)   PRN Meds:.acetaminophen, acetaminophen, HYDROcodone-acetaminophen, HYDROmorphone (DILAUDID) injection, iohexol, ondansetron (ZOFRAN) IV, ondansetron, zolpidem, DISCONTD: HYDROmorphone, DISCONTD: ondansetron (ZOFRAN) IV    LOS: 1 day   Magdalen Cabana,CHRISTOPHER  [facility] Pager [phone]. If 8PM-8AM, please contact night-coverage at [email or web address], password [facility] 06/15/2012, 8:08 AM  LOS: 1 day

## 2012-06-15 NOTE — ED Notes (Signed)
Pt ambulatory to bathroom for BM.  X-ray here to transport pt for CXR.  Pt to go to CT and then to floor.  RN notified of same.

## 2012-06-15 NOTE — Plan of Care (Signed)
Problem: Phase II Progression Outcomes Goal: Other Phase II Outcomes/Goals Outcome: Progressing Bowel prep for colonoscopy on 06/16/12

## 2012-06-15 NOTE — Progress Notes (Signed)
David Conrad [id number] Code Status: FULL Admission Data: 06/15/2012 3:44 AM Attending Provider:  Adela Glimpse WUJ:[id number], MD Consults/ Treatment Team:    David Conrad is a 41 y.o. male patient admitted from ED awake, alert - oriented  X 3 - no acute distress noted.  VSS - Blood pressure 120/76, pulse 81, temperature 98.4 F (36.9 C), temperature source Oral, resp. rate 20, height 5\' 7"  (1.702 m), weight 79.3 kg (174 lb 13.2 oz), SpO2 99.00%.  no c/o shortness of breath, no c/o chest pain. IV Fluids:  IV in place, occlusive dsg intact without redness, IV cath antecubital right, condition patent and no redness normal saline.  Allergies:  No Known Allergies   History reviewed. No pertinent past medical history. No prescriptions prior to admission   History:  obtained from the patient. Tobacco/alcohol: Smoked 1 packs per day for 15 years none  Orientation to room, and floor completed with information packet given to patient/family.  Patient declined safety video at this time.  Admission INP armband ID verified with patient/family, and in place.   SR up x 2, fall assessment complete, with patient and family able to verbalize understanding of risk associated with falls, and verbalized understanding to call nsg before up out of bed.  Call light within reach, patient able to voice, and demonstrate understanding.  Skin, clean-dry- intact without evidence of bruising, or skin tears.   No evidence of skin break down noted on exam.     Will cont to eval and treat per MD orders.  Orvan Seen, RN 06/15/2012 3:44 AM

## 2012-06-16 ENCOUNTER — Encounter (HOSPITAL_COMMUNITY)
Admission: EM | Disposition: A | Discharge status: Home / Self Care | Payer: Self-pay | Source: Home / Self Care | Attending: Internal Medicine

## 2012-06-16 LAB — COMPREHENSIVE METABOLIC PANEL
ALT: 8 U/L (ref 0–53)
AST: 10 U/L (ref 0–37)
Albumin: 2.7 g/dL — ABNORMAL LOW (ref 3.5–5.2)
Alkaline Phosphatase: 72 U/L (ref 39–117)
Calcium: 8.9 mg/dL (ref 8.4–10.5)
GFR calc Af Amer: >90 mL/min (ref >90)
Glucose, Bld: 76 mg/dL (ref 70–99)
Potassium: 3.8 mEq/L (ref 3.5–5.1)
Sodium: 138 mEq/L (ref 135–145)
Total Protein: 6.5 g/dL (ref 6.0–8.3)

## 2012-06-16 LAB — CBC
Hemoglobin: 9.3 g/dL — ABNORMAL LOW (ref 13.0–17.0)
MCH: 20.1 pg — ABNORMAL LOW (ref 26.0–34.0)
MCHC: 30.7 g/dL (ref 30.0–36.0)
Platelets: 415 10*3/uL — ABNORMAL HIGH (ref 150–400)
RDW: 17.5 % — ABNORMAL HIGH (ref 11.5–15.5)

## 2012-06-16 SURGERY — COLONOSCOPY
Anesthesia: Moderate Sedation

## 2012-06-16 MED ORDER — LORAZEPAM 2 MG/ML IJ SOLN
1.0000 mg | Freq: Once | INTRAMUSCULAR | Status: AC
  Administered 2012-06-16: 1 mg via INTRAVENOUS
  Filled 2012-06-16: qty 1

## 2012-06-16 MED ORDER — DOCUSATE SODIUM 50 MG/5ML PO LIQD
100.0000 mg | Freq: Two times a day (BID) | ORAL | Status: DC
  Administered 2012-06-16 – 2012-06-18 (×4): 100 mg via ORAL
  Filled 2012-06-16 (×7): qty 10

## 2012-06-16 MED ORDER — PEG 3350-KCL-NA BICARB-NACL 420 G PO SOLR
4000.0000 mL | Freq: Once | ORAL | Status: AC
  Administered 2012-06-16: 4000 mL via ORAL
  Filled 2012-06-16: qty 4000

## 2012-06-16 MED ORDER — ONDANSETRON HCL 4 MG/2ML IJ SOLN
4.0000 mg | Freq: Four times a day (QID) | INTRAMUSCULAR | Status: DC | PRN
  Administered 2012-06-16: 4 mg via INTRAVENOUS

## 2012-06-16 MED ORDER — PHENOL 1.4 % MT LIQD
1.0000 | OROMUCOSAL | Status: DC | PRN
  Administered 2012-06-16 – 2012-06-20 (×2): 1 via OROMUCOSAL
  Filled 2012-06-16 (×3): qty 177

## 2012-06-16 MED ORDER — ONDANSETRON HCL 4 MG/2ML IJ SOLN
INTRAMUSCULAR | Status: AC
  Administered 2012-06-16: 4 mg via INTRAVENOUS
  Filled 2012-06-16: qty 2

## 2012-06-16 NOTE — Progress Notes (Signed)
[facility] PROGRESS NOTE  David Conrad ZOX:[id number] DOB: 1970/11/02 DOA: 06/14/2012 PCP: Sheila Oats, MD  Assessment/Plan: Principal Problem:  *Colonic mass Active Problems:  Anemia  Dyspnea on exertion  Tobacco abuse    1. Colonic mass: Patient presented with gradually progressive abdominal pain, change in bowel habit and fatigue. Abdominal/pelvic CT demonstrated a 7.8 x 5.6 cm right colonic mass, compatible with primary colonic adenocarcinoma, and suspected local extension into the surrounding pericolonic fat. There was associated ileocolic lymphadenopathy measuring up to 3.8 x 6.3 cm. Dr Charna Elizabeth provided GI consultation, and colonoscopic evaluation/biopsy is scheduled for 06/17/12. CEA tumor marker is elevated at 77. Surgical consultation will be needed in due course.  2. Anemia: This is mild, microcytic, likely due to chronic disease/iron deficiency anemia from occult GI blood loss. Anemia panel has confirmed iron deficiency, with iron of 13 and ferritin of 5. FOBT is positive.  3. Dyspnea on exertion: Likely secondary to anemia. CXR Shows possible atelectasis vs infiltrate left base. D-Dimer was elevated at 2.51, but CTA showed no pulmonary embolism or pneumonic consolidation. Patient is apyrexial, wcc is normal, he has no respiratory tract symptoms and is not hypoxic or tachycardic. 2D Echocardiogram revealed normal LV cavity size, EF of 55% to 60% and no regional wall motion abnormalities. PA peak pressure: 35mm Hg (S). Patient has been reassured accordingly.  4. Tobacco abuse: Patient smokes about a half-pack of cigarettes per day. He has been counseled appropriately, and placed on Nicoderm CQ.   Code Status: Full Code.  Family Communication:  Disposition Plan: To be determined.    Brief narrative: 41 y.o. male smoker, presenting with 1 year history of intermittent abdominal pain, worse after eating, thinner stools, fatigue and dyspnea on exertion. He came to  ED because of worsening symptoms, and abdominal imaging revealed a colonic mass. His grandfather had colonic cancer at age 40 rears. .   Consultants:  GI.  Procedures:  Abdominal/Pelvic CT scan  Antibiotics:  N/A.  HPI/Subjective: No new issues.   Objective: Vital signs in last 24 hours: Temp:  [97.5 F (36.4 C)-99.1 F (37.3 C)] 97.5 F (36.4 C) (10/27 1342) Pulse Rate:  [77-89] 77  (10/27 1342) Resp:  [16] 16  (10/27 1342) BP: (97-117)/(62-80) 117/80 mmHg (10/27 1342) SpO2:  [98 %-99 %] 99 % (10/27 1342) Weight:  [79 kg (174 lb 2.6 oz)] 79 kg (174 lb 2.6 oz) (10/27 0536) Weight change: -0.3 kg (-10.6 oz) Last BM Date: 06/16/12  Intake/Output from previous day: 10/26 0701 - 10/27 0700 In: 2197.9 [P.O.:700; I.V.:1497.9] Out: -      Physical Exam: General: Comfortable, alert, communicative, fully oriented, not short of breath at rest.  HEENT:  Mild clinical pallor, no jaundice, no conjunctival injection or discharge. NECK:  Supple, JVP not seen, no carotid bruits, no palpable lymphadenopathy, no palpable goiter. CHEST:  Clinically clear to auscultation, no wheezes, no crackles. HEART:  Sounds 1 and 2 heard, normal, regular, no murmurs. ABDOMEN:  Full, soft, non-tender, no palpable organomegaly, no palpable masses, normal bowel sounds. GENITALIA:  Not examined. LOWER EXTREMITIES:  No pitting edema, palpable peripheral pulses. MUSCULOSKELETAL SYSTEM:  Unremarkable. CENTRAL NERVOUS SYSTEM:  No focal neurologic deficit on gross examination.  Lab Results:  Basename 06/16/12 0555 06/15/12 0500  WBC 6.6 7.0  HGB 9.3* 10.0*  HCT 30.3* 32.1*  PLT 415* 443*    Basename 06/16/12 0555 06/15/12 0500  NA 138 138  K 3.8 3.9  CL 102 102  CO2 25 25  GLUCOSE  76 89  BUN 6 10  CREATININE 1.04 0.90  CALCIUM 8.9 9.3   No results found for this or any previous visit (from the past 240 hour(s)).   Studies/Results: Dg Chest 2 View  06/15/2012  *RADIOLOGY REPORT*   Clinical Data: Shortness of breath.  CHEST - 2 VIEW  Comparison: None.  Findings: Shallow inspiration.  Normal heart size and pulmonary vascularity.  Infiltration or atelectasis in the left lung base. No blunting of costophrenic angles.  No pneumothorax.  Mediastinal contours appear intact.  IMPRESSION: Shallow inspiration with infiltration or atelectasis in the left lung base.   Original Report Authenticated By: Marlon Pel, M.D.    Ct Angio Chest Pe W/cm &/or Wo Cm  06/15/2012  *RADIOLOGY REPORT*  Clinical Data: Dyspnea on exertion, elevated D-dimer  CT ANGIOGRAPHY CHEST  Technique:  Multidetector CT imaging of the chest using the standard protocol during bolus administration of intravenous contrast. Multiplanar reconstructed images including MIPs were obtained and reviewed to evaluate the vascular anatomy.  Contrast:  80 ml Omnipaque-300 IV  Comparison: Chest radiographs dated 06/15/2012  Findings: No evidence of pulmonary embolism.  Linear scarring/atelectasis in the bilateral lower lobes.   3 mm nodule along the right major fissure (series 5/image 38).  No pleural effusion or pneumothorax.  Visualized thyroid is unremarkable.  The heart is normal in size.  No pericardial effusion.  No suspicious mediastinal, hilar, or axillary lymphadenopathy.  Visualized upper abdomen is unremarkable.  Visualized osseous structures are within normal limits.  IMPRESSION: No evidence of pulmonary embolism.  No evidence of acute cardiopulmonary disease.  3 mm nodule along the right major fissure, likely benign.  In the setting of suspected colonic malignancy, attention on follow-up is suggested.   Original Report Authenticated By: Charline Bills, M.D.    Ct Abdomen Pelvis W Contrast  06/14/2012  *RADIOLOGY REPORT*  Clinical Data: Right abdominal pain  CT ABDOMEN AND PELVIS WITH CONTRAST  Technique:  Multidetector CT imaging of the abdomen and pelvis was performed following the standard protocol during bolus  administration of intravenous contrast.  Contrast: OMNIPAQUE IOHEXOL 300 MG/ML  SOLN  Comparison: None.  Findings: Linear scarring/atelectasis in the bilateral lower lobes.  11 mm probable cyst in the posterior segment right hepatic lobe (series 2/image 28).  Spleen, pancreas, and adrenal glands are within normal limits.  Gallbladder is unremarkable.  No intrahepatic or extrahepatic ductal dilatation.  Kidneys are within normal limits.  No hydronephrosis.  No evidence of bowel obstruction.  7.8 x 5.6 cm right colonic mass (series 2/image 43).  Suspected local extension into the surrounding pericolonic fat.  Associated matted ileocolic lymphadenopathy measuring up to 3.8 x 6.3 cm (series 2/image 47).  No evidence of abdominal aortic aneurysm.  No abdominopelvic ascites.  Prostate is top normal in size and indents the base of the bladder.  Bladder is unremarkable.  Mild degenerative changes of the visualized thoracolumbar spine.  IMPRESSION: 7.8 x 5.6 cm right colonic mass, compatible with primary colonic adenocarcinoma.  Suspected local extension into the surrounding pericolonic fat.  Associated ileocolic lymphadenopathy measuring up to 3.8 x 6.3 cm.  These results were called by telephone on 06/14/2012 at 1144 hrs to Dr. Linwood Dibbles, who verbally acknowledged these results.   Original Report Authenticated By: Charline Bills, M.D.     Medications: Scheduled Meds:    . docusate sodium  100 mg Oral BID  . enoxaparin (LOVENOX) injection  40 mg Subcutaneous Q24H  . LORazepam  1 mg Intravenous  Once  . nicotine  14 mg Transdermal Daily  . pantoprazole  40 mg Oral Q1200  . polyethylene glycol-electrolytes  4,000 mL Oral Once  . polyethylene glycol-electrolytes  4,000 mL Oral Once  . DISCONTD: ondansetron       Continuous Infusions:    . sodium chloride 1,000 mL (06/16/12 0122)   PRN Meds:.acetaminophen, acetaminophen, HYDROcodone-acetaminophen, HYDROmorphone (DILAUDID) injection, iohexol,  ondansetron, zolpidem, DISCONTD: ondansetron    LOS: 2 days   David Conrad,David Conrad  [facility] Pager [phone]. If 8PM-8AM, please contact night-coverage at [email or web address], password [facility] 06/16/2012, 3:59 PM  LOS: 2 days

## 2012-06-16 NOTE — Progress Notes (Signed)
Provider contacted RN. RN made Provider aware that patient was not clear and was unable to finish prep throughout the night due to getting nauseous. Provider ordered pt to finish the rest of the current prep and for another prep to be completed since patient is not clear and pieces of formed bowel can still be seen coming out. Will continue to monitor.   Marcelyn Bruins RN BSN

## 2012-06-17 ENCOUNTER — Encounter (HOSPITAL_COMMUNITY)
Admission: EM | Disposition: A | Discharge status: Home / Self Care | Payer: Self-pay | Source: Home / Self Care | Attending: Internal Medicine

## 2012-06-17 ENCOUNTER — Encounter (HOSPITAL_COMMUNITY): Payer: Self-pay | Admitting: Gastroenterology

## 2012-06-17 HISTORY — PX: COLONOSCOPY: SHX5424

## 2012-06-17 LAB — COMPREHENSIVE METABOLIC PANEL
ALT: 8 U/L (ref 0–53)
AST: 10 U/L (ref 0–37)
CO2: 22 mEq/L (ref 19–32)
Chloride: 103 mEq/L (ref 96–112)
Creatinine, Ser: 0.97 mg/dL (ref 0.50–1.35)
GFR calc Af Amer: >90 mL/min (ref >90)
GFR calc non Af Amer: >90 mL/min (ref >90)
Glucose, Bld: 64 mg/dL — ABNORMAL LOW (ref 70–99)
Total Bilirubin: 0.3 mg/dL (ref 0.3–1.2)

## 2012-06-17 LAB — CBC
Hemoglobin: 9.3 g/dL — ABNORMAL LOW (ref 13.0–17.0)
MCH: 19.5 pg — ABNORMAL LOW (ref 26.0–34.0)
MCV: 65.1 fL — ABNORMAL LOW (ref 78.0–100.0)
Platelets: 409 10*3/uL — ABNORMAL HIGH (ref 150–400)
RBC: 4.78 MIL/uL (ref 4.22–5.81)
WBC: 7.2 10*3/uL (ref 4.0–10.5)

## 2012-06-17 SURGERY — COLONOSCOPY
Anesthesia: Moderate Sedation

## 2012-06-17 MED ORDER — MIDAZOLAM HCL 5 MG/5ML IJ SOLN
INTRAMUSCULAR | Status: DC | PRN
  Administered 2012-06-17 (×3): 2 mg via INTRAVENOUS

## 2012-06-17 MED ORDER — MIDAZOLAM HCL 5 MG/ML IJ SOLN
INTRAMUSCULAR | Status: AC
  Filled 2012-06-17: qty 2

## 2012-06-17 MED ORDER — DIPHENHYDRAMINE HCL 50 MG/ML IJ SOLN
INTRAMUSCULAR | Status: DC | PRN
  Administered 2012-06-17: 12.5 mg via INTRAVENOUS

## 2012-06-17 MED ORDER — DIPHENHYDRAMINE HCL 50 MG/ML IJ SOLN
INTRAMUSCULAR | Status: AC
  Filled 2012-06-17: qty 1

## 2012-06-17 MED ORDER — FENTANYL CITRATE 0.05 MG/ML IJ SOLN
INTRAMUSCULAR | Status: AC
  Filled 2012-06-17: qty 2

## 2012-06-17 MED ORDER — FENTANYL CITRATE 0.05 MG/ML IJ SOLN
INTRAMUSCULAR | Status: DC | PRN
  Administered 2012-06-17 (×4): 25 ug via INTRAVENOUS

## 2012-06-17 NOTE — Op Note (Signed)
[facility] [address] [place], [address]   OPERATIVE PROCEDURE REPORT  PATIENT: David Conrad, David Conrad  MR#: [id number] BIRTHDATE: Dec 18, 1970 GENDER: Male ENDOSCOPIST: Dr.  Lorenza Burton, MD ASSISTANT:   Karie Soda, technician & Darral Dash, RN. PROCEDURE DATE: 06/17/2012  PRE-PROCEDURE PREPARATION: The patient was prepped with 2 gallons of N[place] the night prior to the procedure.  The patient was fasted for 8 hours prior to the procedure. PRE-PROCEDURE PHYSICAL: Patient has stable vital signs.  Neck is supple.  There is no JVD, thyromegaly or LAD.  Chest clear to auscultation.  S1 and S2 regular.  Abdomen soft, non-distended, non-tender with NABS. PROCEDURE:     Colonoscopy with multiple cold biopsies. ASA CLASS:     Class II INDICATIONS:     1.  Change in bowel habits.   2.  Iron deficiency anemia 3) Melena 4) Abnormal weight loss 5) Family history of colon cancer.Marland Kitchen MEDICATIONS:     Fentanyl 100 mcg, Versed 6 mg, and Benadryl 12.5 gm IV.  DESCRIPTION OF PROCEDURE: After the risks, benefits, and alternatives of the procedure were thoroughly explained [including a 10% missed rate of cancer and polyps], informed consent was obtained.  Digital rectal exam was performed.  The Pentax Colonoscope [id number]  was introduced through the anus  and advanced to the cecum, which was identified by both the appendix and ileocecal valve , limited by No adverse events experienced. The quality of the prep was fair. . Multiple washes were done. Small lesions could be missed. The instrument was then slowly withdrawn as the colon was fully examined.     COLON FINDINGS: A large 7 cm circumferential mass was noted in the proximal right colon-multiple biopsies were done. The rest of the colonic mucosa appeared healthy with a normal vascular pattern. No polyps, diverticula or AVMs were noted.  The ICV were identified and photographed. The base of the cecum was  not visualized. Retroflexed views revealed small internal hemorrhoids. The patient tolerated the procedure without immediate complications.  The scope was then withdrawn from the patient and the procedure terminated.  TIME TO CECUM:   8 minutes 0 seconds WITHDRAW TIME:  8 minutes 0 seconds  IMPRESSION:      Large 7 cm circumferential mass in the proximal right colon-biopsies done.      RECOMMENDATIONS:     1.  Avoid all NSAIDS for the now. 2.  Await pathology results. 3) Surgical evaluation ASAP-[facility] called.   REPEAT EXAM:      Not decided yet; will be planned depending on the surgical pathology.  If the patient has any abnormal GI symptoms in the interim, they have been advised to contact the office as soon as possible for further recommendations.   CPT CODES:     [id number], Colonoscopy with Biopsy   DIAGNOSIS CODES:     569.3, 783.21, V16.0, V76.51   REFERRED BY: [facility]-Dr. Ricke Hey.  eSigned:  Dr. Lorenza Burton, MD 06/17/2012 4:36 PM   PATIENT NAME:  Jaedin, Vanderschaaf MR#: [id number]

## 2012-06-17 NOTE — Progress Notes (Signed)
[facility] PROGRESS NOTE  David Conrad ZOX:[id number] DOB: 1971/01/05 DOA: 06/14/2012 PCP: David Oats, MD  Assessment/Plan: Principal Problem:  *Colonic mass Active Problems:  Anemia  Dyspnea on exertion  Tobacco abuse    1. Colonic mass: Patient presented with gradually progressive abdominal pain, change in bowel habit and fatigue. Abdominal/pelvic CT demonstrated a 7.8 x 5.6 cm right colonic mass, compatible with primary colonic adenocarcinoma, and suspected local extension into the surrounding pericolonic fat. There was associated ileocolic lymphadenopathy measuring up to 3.8 x 6.3 cm. CEA tumor marker is elevated at 77. Dr Charna Elizabeth provided GI consultation, and colonoscopic evaluation/biopsy was carried out on 06/17/12. Surgical consultation has been requested by Dr Loreta Ave.  2. Anemia: This is mild, microcytic, likely due to chronic disease/iron deficiency anemia from occult GI blood loss. Anemia panel has confirmed iron deficiency, with iron of 13 and ferritin of 5. FOBT is positive.  3. Dyspnea on exertion: Likely secondary to anemia. CXR Shows possible atelectasis vs infiltrate left base. D-Dimer was elevated at 2.51, but CTA showed no pulmonary embolism or pneumonic consolidation. Patient is apyrexial, wcc is normal, he has no respiratory tract symptoms and is not hypoxic or tachycardic. 2D Echocardiogram revealed normal LV cavity size, EF of 55% to 60% and no regional wall motion abnormalities. PA peak pressure: 35mm Hg (S). Patient has been reassured accordingly.  4. Tobacco abuse: Patient smokes about a half-pack of cigarettes per day. He has been counseled appropriately, and placed on Nicoderm CQ.   Code Status: Full Code.  Family Communication:  Disposition Plan: To be determined.    Brief narrative: 41 y.o. male smoker, presenting with 1 year history of intermittent abdominal pain, worse after eating, thinner stools, fatigue and dyspnea on exertion. He came  to ED because of worsening symptoms, and abdominal imaging revealed a colonic mass. His grandfather had colonic cancer at age 74 rears. .   Consultants:  GI.  Procedures:  Abdominal/Pelvic CT scan  Antibiotics:  N/A.  HPI/Subjective: No new issues.   Objective: Vital signs in last 24 hours: Temp:  [98 F (36.7 C)-98.9 F (37.2 C)] 98.8 F (37.1 C) (10/28 1722) Pulse Rate:  [68-85] 85  (10/28 1617) Resp:  [10-87] 20  (10/28 1722) BP: (103-142)/(61-96) 125/85 mmHg (10/28 1722) SpO2:  [96 %-100 %] 98 % (10/28 1722) Weight:  [79.9 kg (176 lb 2.4 oz)] 79.9 kg (176 lb 2.4 oz) (10/28 0436) Weight change: 0.9 kg (1 lb 15.8 oz) Last BM Date: 06/17/12  Intake/Output from previous day: 10/27 0701 - 10/28 0700 In: 4156.7 [I.V.:1466.7; NG/GT:2690] Out: 1600 [Urine:1600]     Physical Exam: General: Comfortable, alert, communicative, not short of breath at rest.  HEENT:  Mild clinical pallor, no jaundice, no conjunctival injection or discharge. NECK:  Supple, JVP not seen, no carotid bruits, no palpable lymphadenopathy, no palpable goiter. CHEST:  Clinically clear to auscultation, no wheezes, no crackles. HEART:  Sounds 1 and 2 heard, normal, regular, no murmurs. ABDOMEN:  Full, soft, non-tender, no palpable organomegaly, no palpable masses, normal bowel sounds. GENITALIA:  Not examined. LOWER EXTREMITIES:  No pitting edema, palpable peripheral pulses. MUSCULOSKELETAL SYSTEM:  Unremarkable. CENTRAL NERVOUS SYSTEM:  No focal neurologic deficit on gross examination.  Lab Results:  [facility] 06/17/12 0615 06/16/12 0555  WBC 7.2 6.6  HGB 9.3* 9.3*  HCT 31.1* 30.3*  PLT 409* 415*    Basename 06/17/12 0615 06/16/12 0555  NA 138 138  K 3.8 3.8  CL 103 102  CO2 22 25  GLUCOSE 64* 76  BUN 6 6  CREATININE 0.97 1.04  CALCIUM 9.1 8.9   No results found for this or any previous visit (from the past 240 hour(s)).   Studies/Results: No results  found.  Medications: Scheduled Meds:    . docusate  100 mg Oral BID  . enoxaparin (LOVENOX) injection  40 mg Subcutaneous Q24H  . nicotine  14 mg Transdermal Daily  . pantoprazole  40 mg Oral Q1200  . DISCONTD: docusate sodium  100 mg Oral BID   Continuous Infusions:    . sodium chloride 1,000 mL (06/17/12 1033)   PRN Meds:.acetaminophen, acetaminophen, diphenhydrAMINE, fentaNYL, HYDROcodone-acetaminophen, HYDROmorphone (DILAUDID) injection, midazolam, ondansetron, phenol, zolpidem    LOS: 3 days   Chenika Nevils,CHRISTOPHER  [facility] Pager [phone]. If 8PM-8AM, please contact night-coverage at [email or web address], password [facility] 06/17/2012, 6:10 PM  LOS: 3 days

## 2012-06-17 NOTE — Progress Notes (Signed)
   CARE MANAGEMENT NOTE 06/17/2012  Patient:  David Conrad, David Conrad   Account Number:  [id number]  Date Initiated:  06/17/2012  Documentation initiated by:  [facility]  Subjective/Objective Assessment:   colon mass, EGD with biopsy     Action/Plan:   lives at home with wife and small sons   Anticipated DC Date:     Anticipated DC Plan:  HOME/SELF CARE      DC Planning Services  CM consult  Medication Assistance  [facility]      Choice offered to / List presented to:             Status of service:  In process, will continue to follow Medicare Important Message given?   (If response is "NO", the following Medicare IM given date fields will be blank) Date Medicare IM given:   Date Additional Medicare IM given:    Discharge Disposition:    Per UR Regulation:    If discussed at Long Length of Stay Meetings, dates discussed:    Comments:  06/17/2012 1030 NCM spoke to pt and states he was working a temp job. The job had potential for full-time with benefits but it ended. He is currently not working. His wife is not working. His children have Medicaid but he has not applied. NCM contacted main pharmacy. Will need to verify pt did not live on [address] If he has not lived on [address], he is eligible. NCM contacted financial counselor, Arline Asp. They will follow up to see if pt qualifies for Medicaid. Isidoro Donning RN CCM Case Mgmt phone [phone]

## 2012-06-17 NOTE — Progress Notes (Signed)
Golytely completed at 2345, he stated " they told me the tube was only being put to give me the prep." MD Orders checked to verify and orders state to place NG to administer golytely due to intolerance of drinking the fluid. NG tube pulled at 2355 per patients request after completion of golytely. Pt tolerated golytely and stools are clear brownish-yellow this AM. Will continue to monitor and assist as needed. David Conrad, [facility]

## 2012-06-17 NOTE — Progress Notes (Signed)
Pt had another tan liquid stool this AM.

## 2012-06-17 NOTE — Progress Notes (Signed)
Utilization review completed.  

## 2012-06-18 ENCOUNTER — Encounter (HOSPITAL_COMMUNITY): Payer: Self-pay | Admitting: Gastroenterology

## 2012-06-18 ENCOUNTER — Encounter (HOSPITAL_COMMUNITY): Payer: Self-pay

## 2012-06-18 DIAGNOSIS — D490 Neoplasm of unspecified behavior of digestive system: Secondary | ICD-10-CM

## 2012-06-18 LAB — BASIC METABOLIC PANEL
BUN: 5 mg/dL — ABNORMAL LOW (ref 6–23)
Creatinine, Ser: 1.02 mg/dL (ref 0.50–1.35)
GFR calc Af Amer: >90 mL/min (ref >90)
GFR calc non Af Amer: >90 mL/min (ref >90)
Potassium: 4 mEq/L (ref 3.5–5.1)

## 2012-06-18 LAB — SURGICAL PCR SCREEN: MRSA, PCR: NEGATIVE

## 2012-06-18 MED ORDER — SODIUM CHLORIDE 0.9 % IV SOLN
1.0000 g | INTRAVENOUS | Status: AC
  Administered 2012-06-19: 1 g via INTRAVENOUS
  Filled 2012-06-18: qty 1

## 2012-06-18 MED ORDER — SODIUM CHLORIDE 0.9 % IV SOLN
INTRAVENOUS | Status: DC
  Administered 2012-06-18: 15:00:00 via INTRAVENOUS

## 2012-06-18 NOTE — Progress Notes (Signed)
Patient ID: David Conrad, male   DOB: 07/16/1971, 41 y.o.   MRN: [id number] 1 Day Post-Op  Subjective: No complaints, denies n/v, +flatus very little BM  Objective: Vital signs in last 24 hours: Temp:  [97.6 F (36.4 C)-99.5 F (37.5 C)] 97.6 F (36.4 C) (10/29 0444) Pulse Rate:  [76-85] 76  (10/29 0444) Resp:  [10-87] 18  (10/29 0444) BP: (99-142)/(61-96) 99/62 mmHg (10/29 0444) SpO2:  [96 %-100 %] 100 % (10/29 0444) Weight:  [174 lb 9.7 oz (79.2 kg)] 174 lb 9.7 oz (79.2 kg) (10/29 0444) Last BM Date: 06/17/12  Intake/Output from previous day:   Intake/Output this shift:    PE: Abd: soft, non tender, +BS  Lab Results:   [facility] 06/17/12 0615 06/16/12 0555  WBC 7.2 6.6  HGB 9.3* 9.3*  HCT 31.1* 30.3*  PLT 409* 415*   BMET  Basename 06/18/12 0555 06/17/12 0615  NA 138 138  K 4.0 3.8  CL 100 103  CO2 24 22  GLUCOSE 83 64*  BUN 5* 6  CREATININE 1.02 0.97  CALCIUM 9.6 9.1   PT/INR No results found for this basename: LABPROT:2,INR:2 in the last 72 hours CMP     Component Value Date/Time   NA 138 06/18/2012 0555   K 4.0 06/18/2012 0555   CL 100 06/18/2012 0555   CO2 24 06/18/2012 0555   GLUCOSE 83 06/18/2012 0555   BUN 5* 06/18/2012 0555   CREATININE 1.02 06/18/2012 0555   CALCIUM 9.6 06/18/2012 0555   PROT 6.7 06/17/2012 0615   ALBUMIN 2.9* 06/17/2012 0615   AST 10 06/17/2012 0615   ALT 8 06/17/2012 0615   ALKPHOS 69 06/17/2012 0615   BILITOT 0.3 06/17/2012 0615   GFRNONAA >90 06/18/2012 0555   GFRAA >90 06/18/2012 0555   Lipase     Component Value Date/Time   LIPASE 17 06/14/2012 2018       Studies/Results: No results found.  Anti-infectives: Anti-infectives    None       Assessment/Plan  1.  Partially obstructing colonic mass:   Pt will need surgical resection but will need timing for this.  Dr. Derrell Lolling will see the patient later today to discuss details of the surgery and timing  --keep on liquid diet for now   LOS: 4 days     WHITE, ELIZABETH 06/18/2012

## 2012-06-18 NOTE — Progress Notes (Signed)
Utilization review complete 

## 2012-06-18 NOTE — Progress Notes (Signed)
06/18/2012 0845 NCM spoke to financial counselor, Arline Asp. They will follow up with pt this am for possible Medicaid. Isidoro Donning RN CCM Case Mgmt phone [phone]

## 2012-06-18 NOTE — Progress Notes (Signed)
[facility] PROGRESS NOTE  David Conrad ZOX:[id number] DOB: May 04, 1971 DOA: 06/14/2012 PCP: Sheila Oats, MD  Assessment/Plan: Principal Problem:  *Colonic mass Active Problems:  Anemia  Dyspnea on exertion  Tobacco abuse    1. Colonic mass: Patient presented with gradually progressive abdominal pain, change in bowel habit and fatigue. Abdominal/pelvic CT demonstrated a 7.8 x 5.6 cm right colonic mass, compatible with primary colonic adenocarcinoma, and suspected local extension into the surrounding pericolonic fat. There was associated ileocolic lymphadenopathy measuring up to 3.8 x 6.3 cm. CEA tumor marker is elevated at 77. Dr Charna Elizabeth provided GI consultation, and colonoscopic evaluation/biopsy was carried out on 06/17/12. Pathology report is pending. Dr Axel Filler provided surgical consultation, and surgery is scheduled for 06/19/12. 2. Anemia: This is mild, microcytic, likely due to chronic disease/iron deficiency anemia from occult GI blood loss. Anemia panel has confirmed iron deficiency, with iron of 13 and ferritin of 5. FOBT is positive.  3. Dyspnea on exertion: Likely secondary to anemia. CXR Shows possible atelectasis vs infiltrate left base. D-Dimer was elevated at 2.51, but CTA showed no pulmonary embolism or pneumonic consolidation. Patient is apyrexial, wcc is normal, he has no respiratory tract symptoms and is not hypoxic or tachycardic. 2D Echocardiogram revealed normal LV cavity size, EF of 55% to 60% and no regional wall motion abnormalities. PA peak pressure: 35mm Hg (S). Patient has been reassured accordingly.  4. Tobacco abuse: Patient smokes about a half-pack of cigarettes per day. He has been counseled appropriately, and placed on Nicoderm CQ.   Code Status: Full Code.  Family Communication:  Disposition Plan: To be determined.    Brief narrative: 41 y.o. male smoker, presenting with 1 year history of intermittent abdominal pain, worse after  eating, thinner stools, fatigue and dyspnea on exertion. He came to ED because of worsening symptoms, and abdominal imaging revealed a colonic mass. His grandfather had colonic cancer at age 81 rears. .   Consultants:  GI.  Procedures:  Abdominal/Pelvic CT scan  Antibiotics:  N/A.  HPI/Subjective: No new issues.   Objective: Vital signs in last 24 hours: Temp:  [97.6 F (36.4 C)-99.5 F (37.5 C)] 97.6 F (36.4 C) (10/29 0444) Pulse Rate:  [76-85] 76  (10/29 0444) Resp:  [10-87] 18  (10/29 0444) BP: (99-142)/(61-96) 99/62 mmHg (10/29 0444) SpO2:  [96 %-100 %] 100 % (10/29 0444) Weight:  [79.2 kg (174 lb 9.7 oz)] 79.2 kg (174 lb 9.7 oz) (10/29 0444) Weight change: -0.7 kg (-1 lb 8.7 oz) Last BM Date: 06/17/12  Intake/Output from previous day:       Physical Exam: General: Comfortable, alert, communicative, not short of breath at rest.  HEENT:  Mild clinical pallor, no jaundice, no conjunctival injection or discharge. NECK:  Supple, JVP not seen, no carotid bruits, no palpable lymphadenopathy, no palpable goiter. CHEST:  Clinically clear to auscultation, no wheezes, no crackles. HEART:  Sounds 1 and 2 heard, normal, regular, no murmurs. ABDOMEN:  Full, soft, non-tender, no palpable organomegaly, no palpable masses, normal bowel sounds. GENITALIA:  Not examined. LOWER EXTREMITIES:  No pitting edema, palpable peripheral pulses. MUSCULOSKELETAL SYSTEM:  Unremarkable. CENTRAL NERVOUS SYSTEM:  No focal neurologic deficit on gross examination.  Lab Results:  [facility] 06/17/12 0615 06/16/12 0555  WBC 7.2 6.6  HGB 9.3* 9.3*  HCT 31.1* 30.3*  PLT 409* 415*    Basename 06/18/12 0555 06/17/12 0615  NA 138 138  K 4.0 3.8  CL 100 103  CO2 24 22  GLUCOSE 83 64*  BUN 5* 6  CREATININE 1.02 0.97  CALCIUM 9.6 9.1   No results found for this or any previous visit (from the past 240 hour(s)).   Studies/Results: No results found.  Medications: Scheduled Meds:    .  docusate  100 mg Oral BID  . enoxaparin (LOVENOX) injection  40 mg Subcutaneous Q24H  . ertapenem  1 g Intravenous On Call to OR  . nicotine  14 mg Transdermal Daily  . pantoprazole  40 mg Oral Q1200   Continuous Infusions:    . DISCONTD: sodium chloride 1,000 mL (06/17/12 1033)   PRN Meds:.acetaminophen, acetaminophen, diphenhydrAMINE, fentaNYL, HYDROcodone-acetaminophen, HYDROmorphone (DILAUDID) injection, midazolam, ondansetron, phenol, zolpidem    LOS: 4 days   David Conrad,David Conrad  [facility] Pager [phone]. If 8PM-8AM, please contact night-coverage at [email or web address], password [facility] 06/18/2012, 1:38 PM  LOS: 4 days

## 2012-06-18 NOTE — Progress Notes (Signed)
Agree with above.  D/w the patient the need for surgery.  Will post for Wed.  i d/w pt all risks and benefits and pt understand and wishes to proceed.

## 2012-06-19 ENCOUNTER — Inpatient Hospital Stay (HOSPITAL_COMMUNITY): Payer: Medicaid Other | Admitting: Anesthesiology

## 2012-06-19 ENCOUNTER — Encounter (HOSPITAL_COMMUNITY): Payer: Self-pay | Admitting: Anesthesiology

## 2012-06-19 ENCOUNTER — Encounter (HOSPITAL_COMMUNITY)
Admission: EM | Disposition: A | Discharge status: Home / Self Care | Payer: Self-pay | Source: Home / Self Care | Attending: Internal Medicine

## 2012-06-19 DIAGNOSIS — C189 Malignant neoplasm of colon, unspecified: Secondary | ICD-10-CM

## 2012-06-19 HISTORY — PX: COLOSTOMY REVISION: SHX5232

## 2012-06-19 HISTORY — PX: COLON RESECTION: SHX5231

## 2012-06-19 LAB — CBC
Hemoglobin: 10 g/dL — ABNORMAL LOW (ref 13.0–17.0)
MCH: 19.7 pg — ABNORMAL LOW (ref 26.0–34.0)
Platelets: 394 10*3/uL (ref 150–400)
RBC: 5.08 MIL/uL (ref 4.22–5.81)
WBC: 10.2 10*3/uL (ref 4.0–10.5)

## 2012-06-19 LAB — BASIC METABOLIC PANEL
BUN: 6 mg/dL (ref 6–23)
Calcium: 9.4 mg/dL (ref 8.4–10.5)
GFR calc non Af Amer: >90 mL/min (ref >90)
Glucose, Bld: 86 mg/dL (ref 70–99)

## 2012-06-19 LAB — CREATININE, SERUM
Creatinine, Ser: 0.78 mg/dL (ref 0.50–1.35)
GFR calc Af Amer: >90 mL/min (ref >90)

## 2012-06-19 SURGERY — COLON RESECTION LAPAROSCOPIC
Anesthesia: General | Site: Abdomen | Laterality: Right | Wound class: Clean Contaminated

## 2012-06-19 MED ORDER — NALOXONE HCL 0.4 MG/ML IJ SOLN: 0.4000 mg | INTRAMUSCULAR | Status: DC | PRN

## 2012-06-19 MED ORDER — FENTANYL CITRATE 0.05 MG/ML IJ SOLN
INTRAMUSCULAR | Status: DC | PRN
  Administered 2012-06-19: 50 ug via INTRAVENOUS
  Administered 2012-06-19: 250 ug via INTRAVENOUS

## 2012-06-19 MED ORDER — GLYCOPYRROLATE 0.2 MG/ML IJ SOLN
INTRAMUSCULAR | Status: DC | PRN
  Administered 2012-06-19: .8 mg via INTRAVENOUS

## 2012-06-19 MED ORDER — OXYCODONE HCL 5 MG PO TABS: 5.0000 mg | ORAL_TABLET | Freq: Once | ORAL | Status: DC | PRN

## 2012-06-19 MED ORDER — OXYCODONE HCL 5 MG/5ML PO SOLN: 5.0000 mg | Freq: Once | ORAL | Status: DC | PRN

## 2012-06-19 MED ORDER — BUPIVACAINE-EPINEPHRINE (PF) 0.25% -1:200000 IJ SOLN
INTRAMUSCULAR | Status: DC | PRN
  Administered 2012-06-19: 8 mL

## 2012-06-19 MED ORDER — DIPHENHYDRAMINE HCL 50 MG/ML IJ SOLN: 12.5000 mg | Freq: Four times a day (QID) | INTRAMUSCULAR | Status: DC | PRN

## 2012-06-19 MED ORDER — LIDOCAINE HCL (CARDIAC) 20 MG/ML IV SOLN
INTRAVENOUS | Status: DC | PRN
  Administered 2012-06-19: 70 mg via INTRAVENOUS

## 2012-06-19 MED ORDER — 0.9 % SODIUM CHLORIDE (POUR BTL) OPTIME
TOPICAL | Status: DC | PRN
  Administered 2012-06-19 (×2): 1000 mL

## 2012-06-19 MED ORDER — ONDANSETRON HCL 4 MG/2ML IJ SOLN
INTRAMUSCULAR | Status: DC | PRN
  Administered 2012-06-19: 4 mg via INTRAVENOUS

## 2012-06-19 MED ORDER — DEXTROSE IN LACTATED RINGERS 5 % IV SOLN
INTRAVENOUS | Status: DC
  Administered 2012-06-19 (×2): via INTRAVENOUS
  Administered 2012-06-20: 1000 mL via INTRAVENOUS
  Administered 2012-06-20 (×2): via INTRAVENOUS
  Administered 2012-06-21: 1000 mL via INTRAVENOUS

## 2012-06-19 MED ORDER — HYDROMORPHONE HCL PF 1 MG/ML IJ SOLN
INTRAMUSCULAR | Status: AC
  Filled 2012-06-19: qty 1

## 2012-06-19 MED ORDER — LACTATED RINGERS IV SOLN
INTRAVENOUS | Status: DC | PRN
  Administered 2012-06-19 (×3): via INTRAVENOUS

## 2012-06-19 MED ORDER — MEPERIDINE HCL 25 MG/ML IJ SOLN: 6.2500 mg | INTRAMUSCULAR | Status: DC | PRN

## 2012-06-19 MED ORDER — NEOSTIGMINE METHYLSULFATE 1 MG/ML IJ SOLN
INTRAMUSCULAR | Status: DC | PRN
  Administered 2012-06-19: 4 mg via INTRAVENOUS

## 2012-06-19 MED ORDER — PROMETHAZINE HCL 25 MG/ML IJ SOLN: 6.2500 mg | INTRAMUSCULAR | Status: DC | PRN

## 2012-06-19 MED ORDER — VECURONIUM BROMIDE 10 MG IV SOLR
INTRAVENOUS | Status: DC | PRN
  Administered 2012-06-19: 2 mg via INTRAVENOUS

## 2012-06-19 MED ORDER — HYDROMORPHONE 0.3 MG/ML IV SOLN
INTRAVENOUS | Status: DC
  Administered 2012-06-19: 0.6 mg via INTRAVENOUS
  Administered 2012-06-19: 21:00:00 via INTRAVENOUS
  Administered 2012-06-19: 1.8 mg via INTRAVENOUS
  Administered 2012-06-19: 13:00:00 via INTRAVENOUS
  Administered 2012-06-20: 2.1 mg via INTRAVENOUS
  Administered 2012-06-20: 10:00:00 via INTRAVENOUS
  Administered 2012-06-20: 0.9 mg via INTRAVENOUS
  Administered 2012-06-20: 2.4 mg via INTRAVENOUS
  Administered 2012-06-20: 2.1 mg via INTRAVENOUS
  Administered 2012-06-20: 1.5 mg via INTRAVENOUS
  Administered 2012-06-20 – 2012-06-21 (×2): 0.9 mg via INTRAVENOUS
  Administered 2012-06-21: 1.5 mg via INTRAVENOUS
  Filled 2012-06-19 (×2): qty 25

## 2012-06-19 MED ORDER — DIPHENHYDRAMINE HCL 12.5 MG/5ML PO ELIX
12.5000 mg | ORAL_SOLUTION | Freq: Four times a day (QID) | ORAL | Status: DC | PRN
  Filled 2012-06-19: qty 5

## 2012-06-19 MED ORDER — ONDANSETRON HCL 4 MG/2ML IJ SOLN: 4.0000 mg | Freq: Four times a day (QID) | INTRAMUSCULAR | Status: DC | PRN

## 2012-06-19 MED ORDER — MIDAZOLAM HCL 2 MG/2ML IJ SOLN: 0.5000 mg | Freq: Once | INTRAMUSCULAR | Status: DC | PRN

## 2012-06-19 MED ORDER — SODIUM CHLORIDE 0.9 % IR SOLN
Status: DC | PRN
  Administered 2012-06-19: 1000 mL

## 2012-06-19 MED ORDER — BUPIVACAINE HCL (PF) 0.25 % IJ SOLN
INTRAMUSCULAR | Status: AC
  Filled 2012-06-19: qty 30

## 2012-06-19 MED ORDER — ENOXAPARIN SODIUM 40 MG/0.4ML ~~LOC~~ SOLN
40.0000 mg | SUBCUTANEOUS | Status: DC
  Administered 2012-06-20 – 2012-06-22 (×3): 40 mg via SUBCUTANEOUS
  Filled 2012-06-19 (×4): qty 0.4

## 2012-06-19 MED ORDER — ROCURONIUM BROMIDE 100 MG/10ML IV SOLN
INTRAVENOUS | Status: DC | PRN
  Administered 2012-06-19: 50 mg via INTRAVENOUS
  Administered 2012-06-19 (×2): 10 mg via INTRAVENOUS
  Administered 2012-06-19: 20 mg via INTRAVENOUS

## 2012-06-19 MED ORDER — PHENYLEPHRINE HCL 10 MG/ML IJ SOLN
INTRAMUSCULAR | Status: DC | PRN
  Administered 2012-06-19 (×2): 80 ug via INTRAVENOUS
  Administered 2012-06-19: 120 ug via INTRAVENOUS
  Administered 2012-06-19: 80 ug via INTRAVENOUS
  Administered 2012-06-19: 40 ug via INTRAVENOUS

## 2012-06-19 MED ORDER — SODIUM CHLORIDE 0.9 % IJ SOLN: 9.0000 mL | INTRAMUSCULAR | Status: DC | PRN

## 2012-06-19 MED ORDER — ALBUMIN HUMAN 5 % IV SOLN
INTRAVENOUS | Status: DC | PRN
  Administered 2012-06-19: 10:00:00 via INTRAVENOUS

## 2012-06-19 MED ORDER — HYDROMORPHONE 0.3 MG/ML IV SOLN
INTRAVENOUS | Status: AC
  Filled 2012-06-19: qty 25

## 2012-06-19 MED ORDER — PROPOFOL 10 MG/ML IV BOLUS
INTRAVENOUS | Status: DC | PRN
  Administered 2012-06-19: 200 mg via INTRAVENOUS

## 2012-06-19 MED ORDER — MIDAZOLAM HCL 5 MG/5ML IJ SOLN
INTRAMUSCULAR | Status: DC | PRN
  Administered 2012-06-19: 2 mg via INTRAVENOUS

## 2012-06-19 MED ORDER — HYDROMORPHONE HCL PF 1 MG/ML IJ SOLN
0.2500 mg | INTRAMUSCULAR | Status: DC | PRN
  Administered 2012-06-19 (×2): 0.5 mg via INTRAVENOUS

## 2012-06-19 MED ORDER — WHITE PETROLATUM GEL
Status: AC
  Administered 2012-06-19: 21:00:00
  Filled 2012-06-19: qty 5

## 2012-06-19 MED ORDER — SODIUM CHLORIDE 0.9 % IV SOLN
10.0000 mg | INTRAVENOUS | Status: DC | PRN
  Administered 2012-06-19: 40 ug/min via INTRAVENOUS

## 2012-06-19 SURGICAL SUPPLY — 76 items
APPLIER CLIP ROT 10 11.4 M/L (STAPLE)
BLADE SURG 10 STRL SS (BLADE) ×2 IMPLANT
BLADE SURG ROTATE 9660 (MISCELLANEOUS) ×2 IMPLANT
CANISTER SUCTION 2500CC (MISCELLANEOUS) ×2 IMPLANT
CELLS DAT CNTRL 66122 CELL SVR (MISCELLANEOUS) IMPLANT
CHLORAPREP W/TINT 26ML (MISCELLANEOUS) ×2 IMPLANT
CLIP APPLIE ROT 10 11.4 M/L (STAPLE) IMPLANT
CLOTH BEACON ORANGE TIMEOUT ST (SAFETY) ×2 IMPLANT
COVER SURGICAL LIGHT HANDLE (MISCELLANEOUS) ×2 IMPLANT
DECANTER SPIKE VIAL GLASS SM (MISCELLANEOUS) IMPLANT
DEVICE TROCAR PUNCTURE CLOSURE (ENDOMECHANICALS) ×2 IMPLANT
DRAPE PROXIMA HALF (DRAPES) ×4 IMPLANT
DRAPE UTILITY 15X26 W/TAPE STR (DRAPE) ×4 IMPLANT
DRAPE WARM FLUID 44X44 (DRAPE) ×4 IMPLANT
ELECT BLADE 6.5 EXT (BLADE) ×2 IMPLANT
ELECT CAUTERY BLADE 6.4 (BLADE) ×2 IMPLANT
ELECT REM PT RETURN 9FT ADLT (ELECTROSURGICAL) ×2
ELECTRODE REM PT RTRN 9FT ADLT (ELECTROSURGICAL) ×1 IMPLANT
GEL ULTRASOUND 20GR AQUASONIC (MISCELLANEOUS) IMPLANT
GLOVE BIO SURGEON STRL SZ7.5 (GLOVE) ×4 IMPLANT
GLOVE BIOGEL PI IND STRL 7.0 (GLOVE) ×2 IMPLANT
GLOVE BIOGEL PI IND STRL 7.5 (GLOVE) ×2 IMPLANT
GLOVE BIOGEL PI IND STRL 8 (GLOVE) ×2 IMPLANT
GLOVE BIOGEL PI INDICATOR 7.0 (GLOVE) ×2
GLOVE BIOGEL PI INDICATOR 7.5 (GLOVE) ×2
GLOVE BIOGEL PI INDICATOR 8 (GLOVE) ×2
GLOVE ECLIPSE 7.5 STRL STRAW (GLOVE) ×8 IMPLANT
GLOVE SURG SS PI 7.0 STRL IVOR (GLOVE) ×6 IMPLANT
GOWN STRL NON-REIN LRG LVL3 (GOWN DISPOSABLE) ×8 IMPLANT
HANDLE UNIV ENDO GIA (ENDOMECHANICALS) ×4 IMPLANT
KIT BASIN OR (CUSTOM PROCEDURE TRAY) ×2 IMPLANT
KIT ROOM TURNOVER OR (KITS) ×2 IMPLANT
LEGGING LITHOTOMY PAIR STRL (DRAPES) ×2 IMPLANT
LIGASURE IMPACT 36 18CM CVD LR (INSTRUMENTS) IMPLANT
LIGASURE LAP ATLAS 10MM 37CM (INSTRUMENTS) ×2 IMPLANT
NEEDLE INSUFFLATION 14GA 120MM (NEEDLE) ×2 IMPLANT
NS IRRIG 1000ML POUR BTL (IV SOLUTION) ×4 IMPLANT
PAD ARMBOARD 7.5X6 YLW CONV (MISCELLANEOUS) ×4 IMPLANT
PENCIL BUTTON HOLSTER BLD 10FT (ELECTRODE) ×2 IMPLANT
RELOAD EGIA 45 MED/THCK PURPLE (STAPLE) IMPLANT
RELOAD EGIA 60 MED/THCK PURPLE (STAPLE) ×6 IMPLANT
RTRCTR WOUND ALEXIS 18CM MED (MISCELLANEOUS)
SCISSORS LAP 5X35 DISP (ENDOMECHANICALS) ×2 IMPLANT
SET IRRIG TUBING LAPAROSCOPIC (IRRIGATION / IRRIGATOR) ×2 IMPLANT
SLEEVE ENDOPATH XCEL 5M (ENDOMECHANICALS) ×4 IMPLANT
SPECIMEN JAR LARGE (MISCELLANEOUS) ×2 IMPLANT
SPONGE GAUZE 4X4 12PLY (GAUZE/BANDAGES/DRESSINGS) ×2 IMPLANT
SPONGE LAP 18X18 X RAY DECT (DISPOSABLE) ×6 IMPLANT
STAPLER VISISTAT 35W (STAPLE) ×2 IMPLANT
SURGILUBE 2OZ TUBE FLIPTOP (MISCELLANEOUS) IMPLANT
SUT PDS AB 1 CT  36 (SUTURE)
SUT PDS AB 1 CT 36 (SUTURE) IMPLANT
SUT PDS AB 1 TP1 96 (SUTURE) ×4 IMPLANT
SUT PROLENE 2 0 CT2 30 (SUTURE) IMPLANT
SUT SILK 0 TIES 10X30 (SUTURE) ×2 IMPLANT
SUT SILK 2 0 (SUTURE) ×1
SUT SILK 2-0 18XBRD TIE 12 (SUTURE) ×1 IMPLANT
SUT SILK 3 0 (SUTURE) ×1
SUT SILK 3-0 18XBRD TIE 12 (SUTURE) ×1 IMPLANT
SUT VIC AB 1 CT1 27 (SUTURE) ×1
SUT VIC AB 1 CT1 27XBRD ANBCTR (SUTURE) ×1 IMPLANT
SYS LAPSCP GELPORT 120MM (MISCELLANEOUS)
SYSTEM LAPSCP GELPORT 120MM (MISCELLANEOUS) IMPLANT
TAPE CLOTH SURG 6X10 WHT LF (GAUZE/BANDAGES/DRESSINGS) ×2 IMPLANT
TOWEL OR 17X24 6PK STRL BLUE (TOWEL DISPOSABLE) ×2 IMPLANT
TOWEL OR 17X26 10 PK STRL BLUE (TOWEL DISPOSABLE) ×2 IMPLANT
TRAY FOLEY CATH 14FRSI W/METER (CATHETERS) ×2 IMPLANT
TRAY LAPAROSCOPIC (CUSTOM PROCEDURE TRAY) ×2 IMPLANT
TRAY PROCTOSCOPIC FIBER OPTIC (SET/KITS/TRAYS/PACK) IMPLANT
TROCAR XCEL 12X100 BLDLESS (ENDOMECHANICALS) ×2 IMPLANT
TROCAR XCEL NON-BLD 11X100MML (ENDOMECHANICALS) ×2 IMPLANT
TROCAR XCEL NON-BLD 5MMX100MML (ENDOMECHANICALS) ×2 IMPLANT
TUBE CONNECTING 12X1/4 (SUCTIONS) ×2 IMPLANT
TUBING FILTER THERMOFLATOR (ELECTROSURGICAL) ×2 IMPLANT
WATER STERILE IRR 1000ML POUR (IV SOLUTION) IMPLANT
YANKAUER SUCT BULB TIP NO VENT (SUCTIONS) ×2 IMPLANT

## 2012-06-19 NOTE — Progress Notes (Signed)
[facility] PROGRESS NOTE  David Conrad [id number]:[id number] DOB: Jul 15, 1971 DOA: 06/14/2012 PCP: Sheila Oats, MD  Assessment/Plan: 1. Colonic mass: Patient presented with gradually progressive abdominal pain, change in bowel habit and fatigue. Abdominal/pelvic CT demonstrated a 7.8 x 5.6 cm right colonic mass, compatible with primary colonic adenocarcinoma, and suspected local extension into the surrounding pericolonic fat. There was associated ileocolic lymphadenopathy measuring up to 3.8 x 6.3 cm. CEA tumor marker is elevated at 77. Dr Charna Elizabeth provided GI consultation, and colonoscopic evaluation/biopsy was carried out on 06/17/12. Pathology report is pending. Dr Axel Filler provided surgical consultation, and surgery is scheduled for today . 2. Anemia: This is mild, microcytic, likely due to chronic disease/iron deficiency anemia from occult GI blood loss. Anemia panel has confirmed iron deficiency, with iron of 13 and ferritin of 5. FOBT is positive.   3. Dyspnea on exertion: Likely secondary to anemia. Improved somewhat.  CXR Shows possible atelectasis vs infiltrate left base. D-Dimer was elevated at 2.51, but CTA showed no pulmonary embolism or pneumonic consolidation. Patient is apyrexial, wcc is normal, he has no respiratory tract symptoms and is not hypoxic or tachycardic. 2D Echocardiogram revealed normal LV cavity size, EF of 55% to 60% and no regional wall motion abnormalities. PA peak pressure: 35mm Hg (S). Patient has been reassured accordingly.   4. Tobacco abuse: Patient smokes about a half-pack of cigarettes per day. He has been counseled appropriately, and placed on Nicoderm CQ.     Code Status: full Family Communication: pt at bedside Disposition Plan: home when stable   Consultants:  Surgery  GI  Procedures:  Abdominal/pelvic CT scan  Antibiotics:  N/A  HPI/Subjective: Awake, ambulating in room. Verbalizes anxiety about procedure. Several  questions. Encouraged him to speak to surgeon prior to procedure.   Objective: Filed Vitals:   06/18/12 0444 06/18/12 1407 06/18/12 2244 06/19/12 0451  BP: 99/62 115/73 126/75 114/70  Pulse: 76 82 81 84  Temp: 97.6 F (36.4 C) 97.9 F (36.6 C) 98.5 F (36.9 C) 98.6 F (37 C)  TempSrc: Oral Oral Oral Oral  Resp: 18 18 18 20   Height:      Weight: 79.2 kg (174 lb 9.7 oz)   78.5 kg (173 lb 1 oz)  SpO2: 100% 100% 98% 99%    Intake/Output Summary (Last 24 hours) at 06/19/12 0736 Last data filed at 06/18/12 2100  Gross per 24 hour  Intake    440 ml  Output      0 ml  Net    440 ml   Filed Weights   06/17/12 0436 06/18/12 0444 06/19/12 0451  Weight: 79.9 kg (176 lb 2.4 oz) 79.2 kg (174 lb 9.7 oz) 78.5 kg (173 lb 1 oz)    Exam:   General:  Awake, alert oriented  Cardiovascular: RRR No MGR No LEE PPP  Respiratory: normal effort. No wheeze No rhonchi  Abdomen: round, soft, non-tender to palpation  Data Reviewed: Basic Metabolic Panel:  Lab 06/18/12 6295 06/17/12 0615 06/16/12 0555 06/15/12 0500 06/14/12 2018  NA 138 138 138 138 135  K 4.0 3.8 3.8 3.9 3.5  CL 100 103 102 102 98  CO2 24 22 25 25 26   GLUCOSE 83 64* 76 89 94  BUN 5* 6 6 10 12   CREATININE 1.02 0.97 1.04 0.90 0.95  CALCIUM 9.6 9.1 8.9 9.3 9.7  MG -- -- -- 2.0 --  PHOS -- -- -- 3.5 --   Liver Function Tests:  Lab 06/17/12 0615 06/16/12 2841  06/15/12 0500 06/14/12 2018  AST 10 10 13 14   ALT 8 8 11 14   ALKPHOS 69 72 72 81  BILITOT 0.3 0.3 0.3 0.3  PROT 6.7 6.5 7.1 8.0  ALBUMIN 2.9* 2.7* 2.9* 3.5    Lab 06/14/12 2018  LIPASE 17  AMYLASE --   No results found for this basename: AMMONIA:5 in the last 168 hours CBC:  Lab 06/17/12 0615 06/16/12 0555 06/15/12 0500 06/14/12 2018  WBC 7.2 6.6 7.0 7.4  NEUTROABS -- -- -- 4.3  HGB 9.3* 9.3* 10.0* 10.6*  HCT 31.1* 30.3* 32.1* 34.6*  MCV 65.1* 65.4* 65.0* 65.3*  PLT 409* 415* 443* 438*   Cardiac Enzymes: No results found for this basename:  CKTOTAL:5,CKMB:5,CKMBINDEX:5,TROPONINI:5 in the last 168 hours BNP (last 3 results) No results found for this basename: PROBNP:3 in the last 8760 hours CBG: No results found for this basename: GLUCAP:5 in the last 168 hours  Recent Results (from the past 240 hour(s))  SURGICAL PCR SCREEN     Status: Normal   Collection Time   06/18/12  3:40 PM      Component Value Range Status Comment   MRSA, PCR NEGATIVE  NEGATIVE Final    Staphylococcus aureus NEGATIVE  NEGATIVE Final      Studies: No results found.  Scheduled Meds:   . docusate  100 mg Oral BID  . enoxaparin (LOVENOX) injection  40 mg Subcutaneous Q24H  . ertapenem  1 g Intravenous On Call to OR  . nicotine  14 mg Transdermal Daily  . pantoprazole  40 mg Oral Q1200   Continuous Infusions:   . sodium chloride 100 mL/hr at 06/18/12 1516    Principal Problem:  *Colonic mass Active Problems:  Anemia  Dyspnea on exertion  Tobacco abuse    Time spent: 30 minutes    Gwenyth Bender NP [facility]  If 8PM-8AM, please contact night-coverage at [email or web address], password [facility] 06/19/2012, 7:36 AM  LOS: 5 days

## 2012-06-19 NOTE — Preoperative (Signed)
Beta Blockers   Reason not to administer Beta Blockers:Not Applicable 

## 2012-06-19 NOTE — Anesthesia Postprocedure Evaluation (Signed)
  Anesthesia Post-op Note  Patient: David Conrad  Procedure(s) Performed: Procedure(s) (LRB) with comments: COLON RESECTION LAPAROSCOPIC (Right) COLON RESECTION RIGHT (Right)  Patient Location: PACU  Anesthesia Type:General  Level of Consciousness: awake, alert , oriented and patient cooperative  Airway and Oxygen Therapy: Patient Spontanous Breathing and Patient connected to nasal cannula oxygen  Post-op Pain: mild  Post-op Assessment: Post-op Vital signs reviewed, Patient's Cardiovascular Status Stable, Respiratory Function Stable, Patent Airway, No signs of Nausea or vomiting and Pain level controlled  Post-op Vital Signs: Reviewed and stable  Complications: No apparent anesthesia complications

## 2012-06-19 NOTE — Op Note (Signed)
Pre Operative Diagnosis:  Right colon mass  Post Operative Diagnosis: same  Procedure: Diagnostic laparoscopy, exploratory laparotomy, lysis of adhesions, right hemicolectomy with ileotransverse colon anastomosis.  Surgeon: Dr. Axel Filler  Assistant: Dr. Lindie Spruce  Anesthesia: GETA  EBL: 75 cc  Complications: none  Counts: reported as correct x 2  Findings:  The patient had a large colonic mass. The lateral abdominal wall. There was a plane between the kidney and the mass as well as the duodenum and the mass. There is no palpable nodules palpated.  Indications for procedure:  The patient is a 41 year old male with a history of right colonic pain. Patient was evaluated CT scan found to have a large right-sided colon mass with associated lymphadenopathy. His hematocrit for right colectomy.  Details of the procedure: after the patient was consented the patient was taken back to the operating room. The patient was placed in supine position bilateral SCDs in place. After appropriate antibiotics were confirmed a timeout was called all facts were verified.  A new peritoneum a 14 mm of mercury Veress needletechnique in the left subcostal margin Palmer's point. After this was obtained afebrile and her trochars and placed intra-abdominally as was a 5 mm trocar there was no injury to any intra-abdominal organs. A working port in the left lower quadrant as well as infraumbilical and right lower quadrant and placed in correct position. The mass was seen in the right colon seemed to be very adherent to the lateral abdominal wall. Patient had a mobilized lateral attachments of the mass as well as the colon and was not able to secondary to adhesions and mass effect. At this time I decided to proceed with exploratory laparotomy. A 10 blade was used to make a midline incision superior to the umbilicus both are used to make a hemostasis dissection was carried down the fascia the fascia was incised and was  entered. A Bookwalter system was then placed to help with retraction. The right colonic mass was palpated and seen to be very bulky as well as very bulky lymph nodes. I proceeded to release the right colon from its retroperitoneal via the white line of Toldt.  A portion of the hepatic flexure was involved the abdominal wall perineum and this was resected en bloc.  At this point and reflecting the colon medially the splenic and palpated between the kidney and the mesentery of the colon this was dissected. The duodenum was also freed from the mass this was also dissected away. At this point we were able to find good margins proximally and distally the transverse colon was divided using a GIA stapler in a standard fashion. A portion of the terminal ileum was identified and transected the Endo GIA stapler standard fashion. Mesentery was then taken with the LigaSure. The right ileocolonic vessels were doubly ligated. The specimen was sent off to pathology. At this point we created our anastomosis to be a standard stapled technique. There was no bleeding within the lumen. The lumen was very adequate. We then reapproximated the mesenteric peritoneum with interrupted 3-0 silk in a figure-of-eight fashion. The abdomen was then irrigated out to a total square. The fascias are reapproximated using #1 PDS in a running standard fashion. The skin was then stapled all port sites as well as the midline with staples. The patient was awakened from general anesthesia was taken to recovery in stable condition.

## 2012-06-19 NOTE — Anesthesia Procedure Notes (Signed)
Procedure Name: Intubation Date/Time: 06/19/2012 8:55 AM Performed by: Rogelia Boga Pre-anesthesia Checklist: Patient identified, Emergency Drugs available, Suction available, Patient being monitored and Timeout performed Patient Re-evaluated:Patient Re-evaluated prior to inductionOxygen Delivery Method: Circle system utilized Preoxygenation: Pre-oxygenation with 100% oxygen Intubation Type: IV induction Ventilation: Mask ventilation without difficulty Laryngoscope Size: Mac and 4 Grade View: Grade I Tube type: Oral Tube size: 7.5 mm Number of attempts: 1 Airway Equipment and Method: Stylet Placement Confirmation: ETT inserted through vocal cords under direct vision,  positive ETCO2 and breath sounds checked- equal and bilateral Secured at: 21 cm Tube secured with: Tape Dental Injury: Teeth and Oropharynx as per pre-operative assessment

## 2012-06-19 NOTE — Progress Notes (Signed)
repopr tgiven to [employer] as caregiver

## 2012-06-19 NOTE — Care Management Note (Unsigned)
    Page 1 of 1   06/19/2012     10:47:59 AM   CARE MANAGEMENT NOTE 06/19/2012  Patient:  David Conrad, David Conrad   Account Number:  [id number]  Date Initiated:  06/17/2012  Documentation initiated by:  [facility]  Subjective/Objective Assessment:   colon mass, EGD with biopsy     Action/Plan:   lives at home with wife, Kenna Gilbert # [phone] and small sons   Anticipated DC Date:  06/24/2012   Anticipated DC Plan:  HOME/SELF CARE      DC Planning Services  CM consult  Medication Assistance  [facility]      Choice offered to / List presented to:             Status of service:  In process, will continue to follow Medicare Important Message given?   (If response is "NO", the following Medicare IM given date fields will be blank) Date Medicare IM given:   Date Additional Medicare IM given:    Discharge Disposition:    Per UR Regulation:  Reviewed for med. necessity/level of care/duration of stay  If discussed at Long Length of Stay Meetings, dates discussed:    Comments:  06/19/12 110:45 Letha Cape RN, BSN [phone] patient in surgery for colon mass for colon resection today.  NCM will continue to follow for dc needs.  06/18/2012 0845 NCM spoke to financial counselor, Arline Asp. They will follow up with pt this am for possible Medicaid. Isidoro Donning RN CCM Case Mgmt phone [phone]  06/17/2012 1030 NCM spoke to pt and states he was working a temp job. The job had potential for full-time with benefits but it ended. He is currently not working. His wife is not working. His children have Medicaid but he has not applied. NCM contacted main pharmacy. Will need to verify pt did not live on [address] If he has not lived on [place], he is eligible. NCM contacted financial counselor, Arline Asp. They will follow up to see if pt qualifies for Medicaid. Isidoro Donning RN CCM Case Mgmt phone [phone]

## 2012-06-19 NOTE — Transfer of Care (Signed)
Immediate Anesthesia Transfer of Care Note  Patient: David Conrad  Procedure(s) Performed: Procedure(s) (LRB) with comments: COLON RESECTION LAPAROSCOPIC (Right) COLON RESECTION RIGHT (Right)  Patient Location: PACU  Anesthesia Type:General  Level of Consciousness: awake, alert , oriented and patient cooperative  Airway & Oxygen Therapy: Patient Spontanous Breathing and Patient connected to nasal cannula oxygen  Post-op Assessment: Report given to PACU RN, Post -op Vital signs reviewed and stable and Patient moving all extremities X 4  Post vital signs: Reviewed and stable  Complications: No apparent anesthesia complications

## 2012-06-19 NOTE — Anesthesia Preprocedure Evaluation (Addendum)
Anesthesia Evaluation  Patient identified by MRN, date of birth, ID band Patient awake    Reviewed: Allergy & Precautions, H&P , NPO status , Patient's Chart, lab work & pertinent test results  History of Anesthesia Complications Negative for: history of anesthetic complications  Airway Mallampati: II TM Distance: >3 FB Neck ROM: Full    Dental No notable dental hx. (+) Teeth Intact and Dental Advisory Given   Pulmonary Current Smoker,  breath sounds clear to auscultation  Pulmonary exam normal       Cardiovascular negative cardio ROS  Rhythm:Regular Rate:Normal     Neuro/Psych negative neurological ROS     GI/Hepatic negative GI ROS, Neg liver ROS,   Endo/Other    Renal/GU negative Renal ROS     Musculoskeletal   Abdominal   Peds  Hematology  (+) Blood dyscrasia, anemia ,   Anesthesia Other Findings   Reproductive/Obstetrics                           Anesthesia Physical Anesthesia Plan  ASA: II  Anesthesia Plan: General   Post-op Pain Management:    Induction: Intravenous  Airway Management Planned: Oral ETT  Additional Equipment:   Intra-op Plan:   Post-operative Plan: Extubation in OR  Informed Consent: I have reviewed the patients History and Physical, chart, labs and discussed the procedure including the risks, benefits and alternatives for the proposed anesthesia with the patient or authorized representative who has indicated his/her understanding and acceptance.   Dental advisory given  Plan Discussed with: CRNA, Surgeon and Anesthesiologist  Anesthesia Plan Comments: (Plan routine monitors, GETA)       Anesthesia Quick Evaluation

## 2012-06-19 NOTE — Progress Notes (Signed)
Patient seen and agree with plan.  Just back from surgery.  Sleepy.    Marlin Canary DO

## 2012-06-20 ENCOUNTER — Encounter (HOSPITAL_COMMUNITY): Payer: Self-pay | Admitting: General Surgery

## 2012-06-20 LAB — CBC
Platelets: 440 10*3/uL — ABNORMAL HIGH (ref 150–400)
RBC: 5.02 MIL/uL (ref 4.22–5.81)
RDW: 17.2 % — ABNORMAL HIGH (ref 11.5–15.5)
WBC: 9.1 10*3/uL (ref 4.0–10.5)

## 2012-06-20 LAB — BASIC METABOLIC PANEL
CO2: 30 mEq/L (ref 19–32)
Chloride: 96 mEq/L (ref 96–112)
Creatinine, Ser: 0.75 mg/dL (ref 0.50–1.35)
GFR calc Af Amer: >90 mL/min (ref >90)
Potassium: 3.6 mEq/L (ref 3.5–5.1)
Sodium: 134 mEq/L — ABNORMAL LOW (ref 135–145)

## 2012-06-20 MED ORDER — PANTOPRAZOLE SODIUM 40 MG IV SOLR
40.0000 mg | INTRAVENOUS | Status: DC
  Administered 2012-06-20 – 2012-06-21 (×2): 40 mg via INTRAVENOUS
  Filled 2012-06-20 (×3): qty 40

## 2012-06-20 MED ORDER — CHLORHEXIDINE GLUCONATE 0.12 % MT SOLN
15.0000 mL | Freq: Two times a day (BID) | OROMUCOSAL | Status: DC
  Administered 2012-06-21 – 2012-06-23 (×5): 15 mL via OROMUCOSAL
  Filled 2012-06-20 (×7): qty 15

## 2012-06-20 MED ORDER — MENTHOL 3 MG MT LOZG
1.0000 | LOZENGE | OROMUCOSAL | Status: DC | PRN
  Filled 2012-06-20: qty 9

## 2012-06-20 MED ORDER — BIOTENE DRY MOUTH MT LIQD
15.0000 mL | Freq: Two times a day (BID) | OROMUCOSAL | Status: DC
  Administered 2012-06-20 – 2012-06-21 (×3): 15 mL via OROMUCOSAL

## 2012-06-20 NOTE — Progress Notes (Signed)
Agree with PA-Osborne's PN -Con't NGT -Dc foley -await bowel function

## 2012-06-20 NOTE — Progress Notes (Signed)
Patient ID: David Conrad, male   DOB: March 31, 1971, 41 y.o.   MRN: [id number] 1 Day Post-Op  Subjective: Pt c/o sore and hurts to cough.  Otherwise no flatus yet.  Objective: Vital signs in last 24 hours: Temp:  [97.7 F (36.5 C)-99.1 F (37.3 C)] 98.4 F (36.9 C) (10/31 0612) Pulse Rate:  [68-92] 88  (10/31 0612) Resp:  [6-27] 18  (10/31 0805) BP: (97-174)/(64-160) 109/68 mmHg (10/31 0612) SpO2:  [98 %-100 %] 100 % (10/31 0805) Last BM Date: 06/19/12  Intake/Output from previous day: 10/30 0701 - 10/31 0700 In: 5189.9 [I.V.:4879.9; NG/GT:60; IV Piggyback:250] Out: 2750 [Urine:2600; Emesis/NG output:150] Intake/Output this shift:    PE: Abd: soft, appropriately tender, few BS, NGT with minimal watered down output.  Incisions are c/d/i with minimal old drainage at superior aspect of midline wound.  Lab Results:   Basename 06/20/12 0510 06/19/12 1518  WBC 9.1 10.2  HGB 9.6* 10.0*  HCT 31.9* 32.3*  PLT 440* 394   BMET  Basename 06/20/12 0510 06/19/12 1518 06/19/12 0640  NA 134* -- 132*  K 3.6 -- 3.5  CL 96 -- 95*  CO2 30 -- 21  GLUCOSE 169* -- 86  BUN 3* -- 6  CREATININE 0.75 0.78 --  CALCIUM 9.0 -- 9.4   PT/INR No results found for this basename: LABPROT:2,INR:2 in the last 72 hours CMP     Component Value Date/Time   NA 134* 06/20/2012 0510   K 3.6 06/20/2012 0510   CL 96 06/20/2012 0510   CO2 30 06/20/2012 0510   GLUCOSE 169* 06/20/2012 0510   BUN 3* 06/20/2012 0510   CREATININE 0.75 06/20/2012 0510   CALCIUM 9.0 06/20/2012 0510   PROT 6.7 06/17/2012 0615   ALBUMIN 2.9* 06/17/2012 0615   AST 10 06/17/2012 0615   ALT 8 06/17/2012 0615   ALKPHOS 69 06/17/2012 0615   BILITOT 0.3 06/17/2012 0615   GFRNONAA >90 06/20/2012 0510   GFRAA >90 06/20/2012 0510   Lipase     Component Value Date/Time   LIPASE 17 06/14/2012 2018       Studies/Results: No results found.  Anti-infectives: Anti-infectives     Start     Dose/Rate Route Frequency  Ordered Stop   06/19/12 0600   ertapenem (INVANZ) 1 g in sodium chloride 0.9 % 50 mL IVPB        1 g 100 mL/hr over 30 Minutes Intravenous On call to O.R. 06/18/12 1305 06/19/12 4098           Assessment/Plan  1. S/p ileocecectomy with anastomosis  Plan: 1. Cont NGT and NPO, although, I think this may be able to be dc tomorrow as he does have a few BS and minimal output. 2. Cont PCA 3. Dc foley today 4. Mobilize and pulm toilet   LOS: 6 days    Deeanne Deininger E 06/20/2012, 8:13 AM Pager: [phone]

## 2012-06-20 NOTE — Progress Notes (Signed)
[facility] PROGRESS NOTE  David Conrad ZOX:[id number] DOB: Jul 31, 1971 DOA: 06/14/2012 PCP: Sheila Oats, MD  Assessment/Plan: 1. Colonic mass: Patient presented with gradually progressive abdominal pain, change in bowel habit and fatigue. Abdominal/pelvic CT demonstrated a 7.8 x 5.6 cm right colonic mass, compatible with primary colonic adenocarcinoma, and suspected local extension into the surrounding pericolonic fat. There was associated ileocolic lymphadenopathy measuring up to 3.8 x 6.3 cm. CEA tumor marker is elevated at 77. Dr Charna Elizabeth provided GI consultation, and colonoscopic evaluation/biopsy was carried out on 06/17/12. Pathology report is pending. Dr Axel Filler provided surgical consultation, and surgery done on 10/30- on PCA pump for pain control . 2. Anemia: This is mild, microcytic, likely due to chronic disease/iron deficiency anemia from occult GI blood loss. Anemia panel has confirmed iron deficiency, with iron of 13 and ferritin of 5. FOBT is positive.   3. Dyspnea on exertion: Likely secondary to anemia. Improved somewhat.  CXR Shows possible atelectasis vs infiltrate left base. D-Dimer was elevated at 2.51, but CTA showed no pulmonary embolism or pneumonic consolidation. Patient is apyrexial, wcc is normal, he has no respiratory tract symptoms and is not hypoxic or tachycardic. 2D Echocardiogram revealed normal LV cavity size, EF of 55% to 60% and no regional wall motion abnormalities. PA peak pressure: 35mm Hg (S). Patient has been reassured accordingly.   4. Tobacco abuse: Patient smokes about a half-pack of cigarettes per day. He has been counseled appropriately, and placed on Nicoderm CQ.     Code Status: full Family Communication: pt at bedside Disposition Plan: home when stable   Consultants:  Surgery  GI  Procedures:  Abdominal/pelvic CT scan -    right hemicolectomy with ileotransverse colon anastomosis.  10/30   Antibiotics:  N/A  HPI/Subjective: Sleepy, no nausea, no vomiting   Objective: Filed Vitals:   06/20/12 0150 06/20/12 0350 06/20/12 0612 06/20/12 0805  BP: 97/66  109/68   Pulse: 86  88   Temp: 98.4 F (36.9 C)  98.4 F (36.9 C)   TempSrc: Oral  Oral   Resp: 20 19 20 18   Height:      Weight:      SpO2: 100% 100% 100% 100%    Intake/Output Summary (Last 24 hours) at 06/20/12 0911 Last data filed at 06/20/12 0649  Gross per 24 hour  Intake 5189.86 ml  Output   2750 ml  Net 2439.86 ml   Filed Weights   06/17/12 0436 06/18/12 0444 06/19/12 0451  Weight: 79.9 kg (176 lb 2.4 oz) 79.2 kg (174 lb 9.7 oz) 78.5 kg (173 lb 1 oz)    Exam:   General:   alert oriented  Cardiovascular: RRR No MGR No LEE PPP  Respiratory: normal effort. No wheeze No rhonchi  Abdomen: covered, tender, dec BS  Data Reviewed: Basic Metabolic Panel:  Lab 06/20/12 8119 06/19/12 1518 06/19/12 0640 06/18/12 0555 06/17/12 0615 06/16/12 0555 06/15/12 0500  NA 134* -- 132* 138 138 138 --  K 3.6 -- 3.5 4.0 3.8 3.8 --  CL 96 -- 95* 100 103 102 --  CO2 30 -- 21 24 22 25  --  GLUCOSE 169* -- 86 83 64* 76 --  BUN 3* -- 6 5* 6 6 --  CREATININE 0.75 0.78 0.89 1.02 0.97 -- --  CALCIUM 9.0 -- 9.4 9.6 9.1 8.9 --  MG -- -- -- -- -- -- 2.0  PHOS -- -- -- -- -- -- 3.5   Liver Function Tests:  Lab 06/17/12 0615 06/16/12  4540 06/15/12 0500 06/14/12 2018  AST 10 10 13 14   ALT 8 8 11 14   ALKPHOS 69 72 72 81  BILITOT 0.3 0.3 0.3 0.3  PROT 6.7 6.5 7.1 8.0  ALBUMIN 2.9* 2.7* 2.9* 3.5    Lab 06/14/12 2018  LIPASE 17  AMYLASE --   No results found for this basename: AMMONIA:5 in the last 168 hours CBC:  Lab 06/20/12 0510 06/19/12 1518 06/17/12 0615 06/16/12 0555 06/15/12 0500 06/14/12 2018  WBC 9.1 10.2 7.2 6.6 7.0 --  NEUTROABS -- -- -- -- -- 4.3  HGB 9.6* 10.0* 9.3* 9.3* 10.0* --  HCT 31.9* 32.3* 31.1* 30.3* 32.1* --  MCV 63.5* 63.6* 65.1* 65.4* 65.0* --  PLT 440* 394 409* 415* 443*  --   Cardiac Enzymes: No results found for this basename: CKTOTAL:5,CKMB:5,CKMBINDEX:5,TROPONINI:5 in the last 168 hours BNP (last 3 results) No results found for this basename: PROBNP:3 in the last 8760 hours CBG: No results found for this basename: GLUCAP:5 in the last 168 hours  Recent Results (from the past 240 hour(s))  SURGICAL PCR SCREEN     Status: Normal   Collection Time   06/18/12  3:40 PM      Component Value Range Status Comment   MRSA, PCR NEGATIVE  NEGATIVE Final    Staphylococcus aureus NEGATIVE  NEGATIVE Final      Studies: No results found.  Scheduled Meds:    . enoxaparin  40 mg Subcutaneous Q24H  . HYDROmorphone      . HYDROmorphone PCA 0.3 mg/mL   Intravenous Q4H  . nicotine  14 mg Transdermal Daily  . pantoprazole  40 mg Oral Q1200  . white petrolatum      . DISCONTD: docusate  100 mg Oral BID  . DISCONTD: enoxaparin (LOVENOX) injection  40 mg Subcutaneous Q24H   Continuous Infusions:    . dextrose 5% lactated ringers 1,000 mL (06/20/12 0804)  . DISCONTD: sodium chloride 100 mL/hr at 06/18/12 1516    Principal Problem:  *Colonic mass Active Problems:  Anemia  Dyspnea on exertion  Tobacco abuse    Time spent: 30 minutes    Ramondo Dietze  [facility] [phone]  If 8PM-8AM, please contact night-coverage at [email or web address], password [facility] 06/20/2012, 9:11 AM  LOS: 6 days

## 2012-06-21 DIAGNOSIS — R911 Solitary pulmonary nodule: Secondary | ICD-10-CM

## 2012-06-21 DIAGNOSIS — C182 Malignant neoplasm of ascending colon: Principal | ICD-10-CM

## 2012-06-21 MED ORDER — DIPHENHYDRAMINE HCL 50 MG/ML IJ SOLN
12.5000 mg | Freq: Four times a day (QID) | INTRAMUSCULAR | Status: DC | PRN
  Administered 2012-06-24: 02:00:00 via INTRAVENOUS
  Filled 2012-06-21: qty 1

## 2012-06-21 MED ORDER — ONDANSETRON HCL 4 MG/2ML IJ SOLN: 4.0000 mg | Freq: Four times a day (QID) | INTRAMUSCULAR | Status: DC | PRN

## 2012-06-21 MED ORDER — SODIUM CHLORIDE 0.9 % IJ SOLN: 9.0000 mL | INTRAMUSCULAR | Status: DC | PRN

## 2012-06-21 MED ORDER — HYDROMORPHONE 0.3 MG/ML IV SOLN
INTRAVENOUS | Status: DC
  Administered 2012-06-21: 1.59 mg via INTRAVENOUS
  Administered 2012-06-21: 0.8 mg via INTRAVENOUS
  Administered 2012-06-21: 0.2 mg via INTRAVENOUS
  Administered 2012-06-22: 0.599 mg via INTRAVENOUS
  Administered 2012-06-22: 0.999 mg via INTRAVENOUS
  Filled 2012-06-21: qty 25

## 2012-06-21 MED ORDER — DIPHENHYDRAMINE HCL 12.5 MG/5ML PO ELIX
12.5000 mg | ORAL_SOLUTION | Freq: Four times a day (QID) | ORAL | Status: DC | PRN
  Filled 2012-06-21: qty 5

## 2012-06-21 MED ORDER — NALOXONE HCL 0.4 MG/ML IJ SOLN: 0.4000 mg | INTRAMUSCULAR | Status: DC | PRN

## 2012-06-21 NOTE — Consult Note (Signed)
HEMATOLOGY ONCOLOGY CONSULTATION   David Conrad  DOB: 1971/06/28  MR#: [id number]  CSN#: [id number]    Requesting Physician: [facility] Dr. Derrell Lolling Primary MD: none  History of present illness: Colon Cancer        41 y.o. AA  male admitted on 06/14/2012 via ED with one year history of postprandial abdominal pain, fatigue and dyspnea on exertion, worse upon presentation. Workup included a CBC, remarkable for H/H 10.6/34.6, low MCV at 65.3, with platelets at 438 and normal WBC. CMET was normal. FOB was positive. CT  of the abdomen and pelvis with contrast on 10 / 26 /2013 showed a  7.8 x 5.6 cm right colonic mass, compatible with primary colonic adenocarcinoma, with  suspected local extension into the surrounding pericolonic fat. Associated ileocolic lymphadenopathy measuring up to 3.8 x 6.3 cm was noted. A11 mm probable cyst in the posterior segment right hepatic lobe was seen.  Colonoscopy performed on 10 /28  /2013 by Dr. Loreta Ave showed a large  7 cm circumferential mass at the proximal Right colon, with multiple samples biopsied, with results consistent with invasive adenocarcinoma. CEA  Was 77.4 He underwent R ileocolectomy on 06/19/2012 by Dr. 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 Negative proximal and distal mucosal margins. Lymphovascular invasion identified. Negative perineural invasion.  6 of 18 lymph nodes positive. Incidental benign lymphoid hyperplasia of the terminal ileum seen. G1, low grade. He is  pT3, pN2a, pMX . Based on these findings, we were kindly requested to evaluate the patient, anticipating that he will need oncological follow up. Of note, patient las limited understanding of the diagnosis due to limited education, thus, his clinical status has  been explained to him at elementary school level, with ease of his anxiety.   Past medical history:      Past Medical History  Diagnosis Date  . Anemia     Tobacco Habituation  Past surgical history:      Past Surgical History  Procedure Date  . Colonoscopy 06/17/2012    Procedure: COLONOSCOPY;  Surgeon: Charna Elizabeth, MD;  Location: [facility] ENDOSCOPY;  Service: Endoscopy;  Laterality: N/A;  . Colon resection 06/19/2012    Procedure: COLON RESECTION LAPAROSCOPIC;  Surgeon: Axel Filler, MD;  Location: [facility] OR;  Service: General;  Laterality: Right;  . Colostomy revision 06/19/2012    Procedure: COLON RESECTION RIGHT;  Surgeon: Axel Filler, MD;  Location: [facility] OR;  Service: General;  Laterality: Right;    Medications:  Prior to Admission:  No prescriptions prior to admission    PPI:[id number], diphenhydrAMINE, diphenhydrAMINE, diphenhydrAMINE, menthol-cetylpyridinium, naloxone, naloxone, ondansetron, ondansetron (ZOFRAN) IV, ondansetron (ZOFRAN) IV, phenol, sodium chloride, sodium chloride  Allergies: No Known Allergies  Family history:     Family History  Problem Relation Age of Onset  . Alcohol abuse Father     Mother alive and well. His paternal grandfather was diagnosed with colon cancer at the age of 58, deceased..                                           Social history:   Married . Children, wife expecting third child. Lives in [place]. [employer].Elementary school education.  Smokes 1 Ppd for 15  years.  No  ETOH.  No Recreational drugs. Full Code. 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 He had another short lived job . He is worried about inability to pay the bills associated with his new diagnosis. Care Management is in the process of arranging this.    Review of systems:  See HPI for significant positives.  Constitutional: Positive  for  45 lb unintentional weight loss over the last year as well as fatigue.  Negative for fever,night sweats, or chills  Eyes: Negative for blurred vision and double vision.  Respiratory:Positive for cough, afraid to expectorate due to fear that "stitches will come out" . No hemoptysis Positive for shortness of breath on exertion.  Cardiovascular: Negative for chest pain. Negative for palpitations.  GI:  As per HPI. No nausea, vomiting, diarrhea, or constipation. Change in bowel caliber, thinner stools. Incisional pain JY:NWGNFAOZ for hematuria. No loss of urinary control. No Urinary retention.  Skin: Negative for itching. No rash. No petechia. No bruising.  Neurological: No headaches. No motor or sensory deficits.No confusion.  Psych: patient is very anxious about diagnosis, agitated at times.   Allergies:    No Known Allergies  Physical exam:      Filed Vitals:   06/21/12 0820  BP:   Pulse:   Temp:   Resp: 15     Body mass index is 27.76 kg/(m^2).  Filed Weights   06/18/12 0444 06/19/12 0451 06/21/12 0607  Weight: 174 lb 9.7 oz (79.2 kg) 173 lb 1 oz (78.5 kg) 177 lb 4 oz (80.4 kg)    General: 41 y.o. male  in no acute distress A. and O. x3  well-developed, ill appearing.  HEENT: Normocephalic, atraumatic, PERRLA. Oral cavity without thrush or lesions. NG tube present. NO blood visible.  Neck supple. no thyromegaly, no cervical or supraclavicular adenopathy  Lungs clear bilaterally . No wheezing, rhonchi or rales. No axillary masses. Breasts: not examined. Cardiac regular rate and rhythm normal S1-S2, no murmur , rubs or gallops Abdomen: incisional tenderness, with the presence of staples.  bowel sounds x4. No HSM. No masses palpable.  GU/rectal: deferred. Extremities no clubbing, no  cyanosis or edema. No bruising or petechial rash Musculoskeletal: no spinal tenderness.  Neuro: Non Focal   Lab results:       CBC  Lab 06/20/12 0510 06/19/12 1518 06/17/12 0615 06/16/12 0555 06/15/12 0500 06/14/12 2018  WBC 9.1 10.2 7.2 6.6 7.0 --  HGB 9.6* 10.0*  9.3* 9.3* 10.0* --  HCT 31.9* 32.3* 31.1* 30.3* 32.1* --  PLT 440* 394 409* 415* 443* --  MCV 63.5* 63.6* 65.1* 65.4* 65.0* --  MCH 19.1* 19.7* 19.5* 20.1* 20.2* --  MCHC 30.1 31.0 29.9* 30.7 31.2 --  RDW 17.2* 17.0* 17.1* 17.5* 17.4* --  LYMPHSABS -- -- -- -- -- 1.6  MONOABS -- -- -- -- -- 1.0  EOSABS -- -- -- -- -- 0.5  BASOSABS -- -- -- -- -- 0.0  BANDABS -- -- -- -- -- --    Anemia panel:  No results found for this basename: VITAMINB12:2,FOLATE:2,FERRITIN:2,TIBC:2,IRON:2,RETICCTPCT:2 in the last 72 hours   Chemistries   Lab 06/20/12 0510 06/19/12 1518 06/19/12 0640 06/18/12 0555 06/17/12 0615 06/16/12 0555 06/15/12 0500  NA 134* -- 132* 138 138 138 --  K 3.6 -- 3.5 4.0 3.8 3.8 --  CL 96 -- 95* 100 103 102 --  CO2 30 -- 21 24 22 25  --  GLUCOSE 169* -- 86 83 64*  76 --  BUN 3* -- 6 5* 6 6 --  CREATININE 0.75 0.78 0.89 1.02 0.97 -- --  CALCIUM 9.0 -- 9.4 9.6 9.1 8.9 --  MG -- -- -- -- -- -- 2.0     Studies:      Dg Chest 2 View  06/15/2012  Comparison: None.  Findings: Shallow inspiration.  Normal heart size and pulmonary vascularity.  Infiltration or atelectasis in the left lung base. No blunting of costophrenic angles.  No pneumothorax.  Mediastinal contours appear intact.  IMPRESSION: Shallow inspiration with infiltration or atelectasis in the left lung base.   Original Report Authenticated By: Marlon Pel, M.D.    Ct Angio Chest Pe W/cm &/or Wo Cm  06/15/2012 Comparison: Chest radiographs dated 06/15/2012  Findings: No evidence of pulmonary embolism.  Linear scarring/atelectasis in the bilateral lower lobes.   3 mm nodule along the right major fissure (series 5/image 38).  No pleural effusion or pneumothorax.  Visualized thyroid is unremarkable.  The heart is normal in size.  No pericardial effusion.  No suspicious mediastinal, hilar, or axillary lymphadenopathy.  Visualized upper abdomen is unremarkable.  Visualized osseous structures are within normal limits.   IMPRESSION: No evidence of pulmonary embolism.  No evidence of acute cardiopulmonary disease.  3 mm nodule along the right major fissure, likely benign.  In the setting of suspected colonic malignancy, attention on follow-up is suggested.   Original Report Authenticated By: Charline Bills, M.D.    Ct Abdomen Pelvis W Contrast  06/14/2012 Comparison: None.  Findings: Linear scarring/atelectasis in the bilateral lower lobes.  11 mm probable cyst in the posterior segment right hepatic lobe (series 2/image 28).  Spleen, pancreas, and adrenal glands are within normal limits.  Gallbladder is unremarkable.  No intrahepatic or extrahepatic ductal dilatation.  Kidneys are within normal limits.  No hydronephrosis.  No evidence of bowel obstruction.  7.8 x 5.6 cm right colonic mass (series 2/image 43).  Suspected local extension into the surrounding pericolonic fat.  Associated matted ileocolic lymphadenopathy measuring up to 3.8 x 6.3 cm (series 2/image 47).  No evidence of abdominal aortic aneurysm.  No abdominopelvic ascites.  Prostate is top normal in size and indents the base of the bladder.  Bladder is unremarkable.  Mild degenerative changes of the visualized thoracolumbar spine.  IMPRESSION: 7.8 x 5.6 cm right colonic mass, compatible with primary colonic adenocarcinoma.  Suspected local extension into the surrounding pericolonic fat.  Associated ileocolic lymphadenopathy measuring up to 3.8 x 6.3 cm.  These results were called by telephone on 06/14/2012 at 1144 hrs to Dr. Linwood Dibbles, who verbally acknowledged these results.   Original Report Authenticated By: Charline Bills, M.D.     Assessmnent/Plan:40 y.o. male found to have a large colonic mass requiring  R ileocolectomy on 06/19/2012 by Dr. Derrell Lolling, with pathology positive for  pT3, pN2a, pMX  ulcerated and mucinous adenocarcinoma, with 6 of 18 lymp hnodes positive.    We were requested to see the patient with recommendations. Dr.Marsh Heckler is to see the  patient following this consult with recommendations regarding diagnosis, treatment options and further workup studies.  An addendum to this note is to be written. Thank you for the referral.   [facility] 06/21/2012    ADDENDUM:  I agree with the assessment by Huntley Dec.  He had IIIC colon ca.  Very young for this.  No FHx except his grandfather.  Had 6 (+) nodes.  Large primary.  Surprisingly. Not a lot of symptoms.  He CLEARLY will need adjuvant chemo for this.  In my mind, this cancer is a "bad actor."  Is mucinous in nature.  I think the "gray area" is whether he will need XRT given the path report with 0.24mm from visceral peritoneum.  The primary was 10cm.  I will speak with Rad Onc about this.  He will need a port-a-cath, but he does NOT want this put in while in the hospital.  He feels overwhelmed by everything and couldn't handle another surgical procedure right now.  I would plan for chemo to tentatively start within 4 weeks.  I would use FOLFOX.  Again, the "?" is whether we will need to incorporate XRT into the "mix."  If so, I would do a "sandwhich" approach with 4 cycles of FOLFOX - 5/FU + XRT - 4 cycles of FOLFOX.  I told he and his wife that without any therapy, I felt that the cancer would have 100% chance of recurrence.  Again, I really think that this is a nasty cancer and that he requires aggressive adjuvant therapy the improve his chances. Even with aggressive adjuvant therapy, there is still a 50-60% of recurrence.  I did not see where a CEA was drawn pre-op.  The CT scan shows a 3mm nodule in the lung, but I think this is NOT a met as he has no liver mets.  He is a real nice guy. I will pray hard for him!!!   Thanks for the consult!!!!  Pete E.  Isaiah 41:10

## 2012-06-21 NOTE — Progress Notes (Signed)
[facility] PROGRESS NOTE  David Conrad WUJ:[id number] DOB: Oct 30, 1970 DOA: 06/14/2012 PCP: Sheila Oats, MD  Assessment/Plan: 1. Colonic mass: Patient presented with gradually progressive abdominal pain, change in bowel habit and fatigue. Abdominal/pelvic CT demonstrated a 7.8 x 5.6 cm right colonic mass, compatible with primary colonic adenocarcinoma, and suspected local extension into the surrounding pericolonic fat. There was associated ileocolic lymphadenopathy measuring up to 3.8 x 6.3 cm. CEA tumor marker is elevated at 77. Dr Charna Elizabeth provided GI consultation, and colonoscopic evaluation/biopsy was carried out on 06/17/12. Pathology shows adenocarcinoma. Dr Axel Filler provided surgical consultation, and surgery done on 10/30.  Heme onc consulted for chemo . 2. Anemia: This is mild, microcytic, likely due to chronic disease/iron deficiency anemia from occult GI blood loss. Anemia panel has confirmed iron deficiency, with iron of 13 and ferritin of 5. FOBT is positive.   3. Dyspnea on exertion: Likely secondary to anemia. Improved somewhat.  CXR Shows possible atelectasis vs infiltrate left base. D-Dimer was elevated at 2.51, but CTA showed no pulmonary embolism or pneumonic consolidation. Patient is apyrexial, wcc is normal, he has no respiratory tract symptoms and is not hypoxic or tachycardic. 2D Echocardiogram revealed normal LV cavity size, EF of 55% to 60% and no regional wall motion abnormalities. PA peak pressure: 35mm Hg (S). Patient has been reassured accordingly.   4. Tobacco abuse: Patient smokes about a half-pack of cigarettes per day. He has been counseled appropriately, and placed on Nicoderm CQ.     Code Status: full Family Communication: pt at bedside Disposition Plan: home when stable   Consultants:  Surgery  GI  Procedures:  Abdominal/pelvic CT scan -    right hemicolectomy with ileotransverse colon anastomosis.  10/30   Antibiotics:  N/A  HPI/Subjective: Up getting a bath No flatus but good UOP   Objective: Filed Vitals:   06/21/12 0820 06/21/12 0918 06/21/12 1053 06/21/12 1116  BP:  115/70    Pulse:  96    Temp:  97.4 F (36.3 C)    TempSrc:  Oral    Resp: 15 17 29 24   Height:      Weight:      SpO2: 95% 96% 98% 100%    Intake/Output Summary (Last 24 hours) at 06/21/12 1140 Last data filed at 06/21/12 9562  Gross per 24 hour  Intake 1853.1 ml  Output   2975 ml  Net -1121.9 ml   Filed Weights   06/18/12 0444 06/19/12 0451 06/21/12 0607  Weight: 79.2 kg (174 lb 9.7 oz) 78.5 kg (173 lb 1 oz) 80.4 kg (177 lb 4 oz)    Exam:   General:   alert oriented  Cardiovascular: RRR No MGR No LEE PPP  Respiratory: normal effort. No wheeze No rhonchi  Abdomen: covered, tender, dec BS  Data Reviewed: Basic Metabolic Panel:  Lab 06/20/12 1308 06/19/12 1518 06/19/12 0640 06/18/12 0555 06/17/12 0615 06/16/12 0555 06/15/12 0500  NA 134* -- 132* 138 138 138 --  K 3.6 -- 3.5 4.0 3.8 3.8 --  CL 96 -- 95* 100 103 102 --  CO2 30 -- 21 24 22 25  --  GLUCOSE 169* -- 86 83 64* 76 --  BUN 3* -- 6 5* 6 6 --  CREATININE 0.75 0.78 0.89 1.02 0.97 -- --  CALCIUM 9.0 -- 9.4 9.6 9.1 8.9 --  MG -- -- -- -- -- -- 2.0  PHOS -- -- -- -- -- -- 3.5   Liver Function Tests:  Lab 06/17/12 0615 06/16/12  9811 06/15/12 0500 06/14/12 2018  AST 10 10 13 14   ALT 8 8 11 14   ALKPHOS 69 72 72 81  BILITOT 0.3 0.3 0.3 0.3  PROT 6.7 6.5 7.1 8.0  ALBUMIN 2.9* 2.7* 2.9* 3.5    Lab 06/14/12 2018  LIPASE 17  AMYLASE --   No results found for this basename: AMMONIA:5 in the last 168 hours CBC:  Lab 06/20/12 0510 06/19/12 1518 06/17/12 0615 06/16/12 0555 06/15/12 0500 06/14/12 2018  WBC 9.1 10.2 7.2 6.6 7.0 --  NEUTROABS -- -- -- -- -- 4.3  HGB 9.6* 10.0* 9.3* 9.3* 10.0* --  HCT 31.9* 32.3* 31.1* 30.3* 32.1* --  MCV 63.5* 63.6* 65.1* 65.4* 65.0* --  PLT 440* 394 409* 415* 443* --   Cardiac  Enzymes: No results found for this basename: CKTOTAL:5,CKMB:5,CKMBINDEX:5,TROPONINI:5 in the last 168 hours BNP (last 3 results) No results found for this basename: PROBNP:3 in the last 8760 hours CBG: No results found for this basename: GLUCAP:5 in the last 168 hours  Recent Results (from the past 240 hour(s))  SURGICAL PCR SCREEN     Status: Normal   Collection Time   06/18/12  3:40 PM      Component Value Range Status Comment   MRSA, PCR NEGATIVE  NEGATIVE Final    Staphylococcus aureus NEGATIVE  NEGATIVE Final      Studies: No results found.  Scheduled Meds:    . antiseptic oral rinse  15 mL Mouth Rinse q12n4p  . chlorhexidine  15 mL Mouth Rinse BID  . enoxaparin  40 mg Subcutaneous Q24H  . HYDROmorphone PCA 0.3 mg/mL   Intravenous Q4H  . nicotine  14 mg Transdermal Daily  . pantoprazole (PROTONIX) IV  40 mg Intravenous Q24H  . DISCONTD: HYDROmorphone PCA 0.3 mg/mL   Intravenous Q4H   Continuous Infusions:    . dextrose 5% lactated ringers 1,000 mL (06/21/12 0826)    Principal Problem:  *Colonic mass Active Problems:  Anemia  Dyspnea on exertion  Tobacco abuse    Time spent: 30 minutes    Debbra Digiulio  [facility] [phone]  If 8PM-8AM, please contact night-coverage at [email or web address], password [facility] 06/21/2012, 11:40 AM  LOS: 7 days

## 2012-06-21 NOTE — Progress Notes (Signed)
Patient ID: Germaine Shenker, male   DOB: [birth year], 41 y.o.   MRN: [id number] 2 Days Post-Op  Subjective: Pt sore, but feeling ok.  No flatus yet.  Ambulated yesterday  Objective: Vital signs in last 24 hours: Temp:  [97.9 F (36.6 C)-99.7 F (37.6 C)] 97.9 F (36.6 C) (11/01 0607) Pulse Rate:  [84-106] 95  (11/01 0607) Resp:  [15-22] 15  (11/01 0820) BP: (96-123)/(51-82) 113/56 mmHg (11/01 0607) SpO2:  [95 %-100 %] 95 % (11/01 0820) Weight:  [177 lb 4 oz (80.4 kg)] 177 lb 4 oz (80.4 kg) (11/01 0607) Last BM Date: 06/19/12  Intake/Output from previous day: 10/31 0701 - 11/01 0700 In: 1853.1 [I.V.:1853.1] Out: 3725 [Urine:3075; Emesis/NG output:650] Intake/Output this shift:    PE: Abd: soft, hypoactive BS, NGT with only saliva-like output.  No bilious output, tender appropriately, incisions c/d/i  Lab Results:   Basename 06/20/12 0510 06/19/12 1518  WBC 9.1 10.2  HGB 9.6* 10.0*  HCT 31.9* 32.3*  PLT 440* 394   BMET  Basename 06/20/12 0510 06/19/12 1518 06/19/12 0640  NA 134* -- 132*  K 3.6 -- 3.5  CL 96 -- 95*  CO2 30 -- 21  GLUCOSE 169* -- 86  BUN 3* -- 6  CREATININE 0.75 0.78 --  CALCIUM 9.0 -- 9.4   PT/INR No results found for this basename: LABPROT:2,INR:2 in the last 72 hours CMP     Component Value Date/Time   NA 134* 06/20/2012 0510   K 3.6 06/20/2012 0510   CL 96 06/20/2012 0510   CO2 30 06/20/2012 0510   GLUCOSE 169* 06/20/2012 0510   BUN 3* 06/20/2012 0510   CREATININE 0.75 06/20/2012 0510   CALCIUM 9.0 06/20/2012 0510   PROT 6.7 06/17/2012 0615   ALBUMIN 2.9* 06/17/2012 0615   AST 10 06/17/2012 0615   ALT 8 06/17/2012 0615   ALKPHOS 69 06/17/2012 0615   BILITOT 0.3 06/17/2012 0615   GFRNONAA >90 06/20/2012 0510   GFRAA >90 06/20/2012 0510   Lipase     Component Value Date/Time   LIPASE 17 06/14/2012 2018       Studies/Results: No results found.  Anti-infectives: Anti-infectives     Start     Dose/Rate Route Frequency  Ordered Stop   06/19/12 0600   ertapenem (INVANZ) 1 g in sodium chloride 0.9 % 50 mL IVPB        1 g 100 mL/hr over 30 Minutes Intravenous On call to O.R. 06/18/12 1305 06/19/12 1478           Assessment/Plan  1. S/p ileocecectomy for colon CA 2. Stage 3C adenoCA of colon  Plan: 1. Cont NGT per MD, will plan on DC tomorrow 2. RN reports patient with some slight confusion on regular dose PCA, will decrease to reduced dose 3. Cont mobilization and pulm toilet 4. Have spoken with Dr. Myna Hidalgo who will come see the patient.    LOS: 7 days    Shermaine Brigham E 06/21/2012, 8:23 AM Pager: [phone]

## 2012-06-21 NOTE — Progress Notes (Addendum)
INITIAL ADULT NUTRITION ASSESSMENT Date: 06/21/2012   Time: 11:45 AM Reason for Assessment: NPO x7 days  ASSESSMENT: Male 41 y.o.  Dx: Colonic mass  Hx:  Past Medical History  Diagnosis Date  . Anemia    Past Surgical History  Procedure Date  . Colonoscopy 06/17/2012    Procedure: COLONOSCOPY;  Surgeon: Charna Elizabeth, MD;  Location: [facility] ENDOSCOPY;  Service: Endoscopy;  Laterality: N/A;  . Colon resection 06/19/2012    Procedure: COLON RESECTION LAPAROSCOPIC;  Surgeon: Axel Filler, MD;  Location: [facility] OR;  Service: General;  Laterality: Right;  . Colostomy revision 06/19/2012    Procedure: COLON RESECTION RIGHT;  Surgeon: Axel Filler, MD;  Location: [facility] OR;  Service: General;  Laterality: Right;    Related Meds:  Scheduled Meds:   . antiseptic oral rinse  15 mL Mouth Rinse q12n4p  . chlorhexidine  15 mL Mouth Rinse BID  . enoxaparin  40 mg Subcutaneous Q24H  . HYDROmorphone PCA 0.3 mg/mL   Intravenous Q4H  . nicotine  14 mg Transdermal Daily  . pantoprazole (PROTONIX) IV  40 mg Intravenous Q24H  . DISCONTD: HYDROmorphone PCA 0.3 mg/mL   Intravenous Q4H   Continuous Infusions:   . dextrose 5% lactated ringers 1,000 mL (06/21/12 0826)   PRN Meds:.diphenhydrAMINE, diphenhydrAMINE, menthol-cetylpyridinium, naloxone, ondansetron, ondansetron (ZOFRAN) IV, phenol, sodium chloride, DISCONTD: diphenhydrAMINE, DISCONTD: diphenhydrAMINE, DISCONTD: naloxone, DISCONTD: ondansetron (ZOFRAN) IV, DISCONTD: sodium chloride   Ht: 5\' 7"  (170.2 cm)  Wt: 177 lb 4 oz (80.4 kg)  Ideal Wt: 67.2 kg % Ideal Wt: 119%  Usual Wt: 220 lbs % Usual Wt: 80%  Body mass index is 27.76 kg/(m^2).  Food/Nutrition Related Hx: wt loss PTA, fatigue  Labs:  CMP     Component Value Date/Time   NA 134* 06/20/2012 0510   K 3.6 06/20/2012 0510   CL 96 06/20/2012 0510   CO2 30 06/20/2012 0510   GLUCOSE 169* 06/20/2012 0510   BUN 3* 06/20/2012 0510   CREATININE 0.75 06/20/2012 0510   CALCIUM 9.0  06/20/2012 0510   PROT 6.7 06/17/2012 0615   ALBUMIN 2.9* 06/17/2012 0615   AST 10 06/17/2012 0615   ALT 8 06/17/2012 0615   ALKPHOS 69 06/17/2012 0615   BILITOT 0.3 06/17/2012 0615   GFRNONAA >90 06/20/2012 0510   GFRAA >90 06/20/2012 0510    CBC    Component Value Date/Time   WBC 9.1 06/20/2012 0510   RBC 5.02 06/20/2012 0510   HGB 9.6* 06/20/2012 0510   HCT 31.9* 06/20/2012 0510   PLT 440* 06/20/2012 0510   MCV 63.5* 06/20/2012 0510   MCH 19.1* 06/20/2012 0510   MCHC 30.1 06/20/2012 0510   RDW 17.2* 06/20/2012 0510   LYMPHSABS 1.6 06/14/2012 2018   MONOABS 1.0 06/14/2012 2018   EOSABS 0.5 06/14/2012 2018   BASOSABS 0.0 06/14/2012 2018    Intake: NPO, sips of liquid Output:   Intake/Output Summary (Last 24 hours) at 06/21/12 1202 Last data filed at 06/21/12 4098  Gross per 24 hour  Intake 1853.1 ml  Output   2975 ml  Net -1121.9 ml   No BM or flatus   Diet Order: NPO  Supplements/Tube Feeding:  None at this time  IVF:    dextrose 5% lactated ringers Last Rate: 1,000 mL (06/21/12 0826)    Estimated Nutritional Needs:   Kcal: 1191-4782 Protein: 91-105g Fluid: >2.1 L/day  Pt up walking in halls at time of visit. His usual wt is 220 lbs, currently weighs 177 lbs (  19.5% wt change).  Pt states this wt loss occurred over 2 years since his initial symptoms started. He also states that he lost from 210 to 190 lbs intentionally with exercise.  Pt reports he has been eating ~2 meals/day more recently- has not had an appetite for lunch. Pt with NGT, awaiting return of bowel function.  Pt has been NPO x7 days. Pt with 2 yr hx of abdominal pain and worsening fatigue.  Pt found to have Stage 3 colon cancer.  Pt underwent R ileocolectomy (10/30).  Pt to meet with oncology to discuss treatment options.  Wt loss is concerning.  Pt does not meet criteria for malnutrition at this time.  RD to follow for diet advancement and reassessments.    NUTRITION DIAGNOSIS: Inadequate  oral intake, ongoing  RELATED TO: omission of energy dense foods  AS EVIDENCE BY: pt NPO/clear liquids x7 days, post-op awaiting return of bowel function  MONITORING/EVALUATION(Goals): 1.  Food/Beverage; diet advancement with tolerance 2.  Wt/wt change; monitor trends  EDUCATION NEEDS: -No education needs identified at this time  INTERVENTION: 1.  Modify diet; per MD discretion and per pt tolerance. 2.  Nutrition support; if warranted please consult RD.  Parenteral nutrition not appropriate at this time as diet advancement expected within a few days and hopeful for removal of NGT tomorrow.  If care plans changes, or pt remains NPO >48 hrs without diet initiation, consider nutrition support.   DOCUMENTATION CODES Per approved criteria  -Not Applicable    Loyce Dys [facility] 06/21/2012, 11:45 AM

## 2012-06-21 NOTE — Progress Notes (Signed)
Pt with stg IIIC colon cancer.  Pt will require chemo.  WIll have H/O d/w pt options and answer questions. -Con't NGT -mobilize -await bowel function

## 2012-06-22 DIAGNOSIS — J029 Acute pharyngitis, unspecified: Secondary | ICD-10-CM | POA: Diagnosis not present

## 2012-06-22 LAB — CREATININE, SERUM
Creatinine, Ser: 0.74 mg/dL (ref 0.50–1.35)
GFR calc Af Amer: >90 mL/min (ref >90)
GFR calc non Af Amer: >90 mL/min (ref >90)

## 2012-06-22 MED ORDER — LACTATED RINGERS IV BOLUS (SEPSIS)
1000.0000 mL | Freq: Three times a day (TID) | INTRAVENOUS | Status: AC | PRN
  Administered 2012-06-22: 1000 mL via INTRAVENOUS

## 2012-06-22 MED ORDER — PROMETHAZINE HCL 25 MG/ML IJ SOLN: 12.5000 mg | Freq: Four times a day (QID) | INTRAMUSCULAR | Status: DC | PRN

## 2012-06-22 MED ORDER — PHENOL 1.4 % MT LIQD
2.0000 | OROMUCOSAL | Status: DC | PRN
  Filled 2012-06-22: qty 177

## 2012-06-22 MED ORDER — SODIUM CHLORIDE 0.9 % IJ SOLN: 9.0000 mL | INTRAMUSCULAR | Status: DC | PRN

## 2012-06-22 MED ORDER — DIPHENHYDRAMINE HCL 50 MG/ML IJ SOLN: 12.5000 mg | Freq: Four times a day (QID) | INTRAMUSCULAR | Status: DC | PRN

## 2012-06-22 MED ORDER — NALOXONE HCL 0.4 MG/ML IJ SOLN: 0.4000 mg | INTRAMUSCULAR | Status: DC | PRN

## 2012-06-22 MED ORDER — ONDANSETRON HCL 4 MG/2ML IJ SOLN: 4.0000 mg | Freq: Four times a day (QID) | INTRAMUSCULAR | Status: DC | PRN

## 2012-06-22 MED ORDER — MAGIC MOUTHWASH: 15.0000 mL | Freq: Four times a day (QID) | ORAL | Status: DC | PRN

## 2012-06-22 MED ORDER — MENTHOL 3 MG MT LOZG: 1.0000 | LOZENGE | OROMUCOSAL | Status: DC | PRN

## 2012-06-22 MED ORDER — KETOROLAC TROMETHAMINE 15 MG/ML IJ SOLN
INTRAMUSCULAR | Status: AC
  Filled 2012-06-22: qty 1

## 2012-06-22 MED ORDER — MAGIC MOUTHWASH
15.0000 mL | Freq: Four times a day (QID) | ORAL | Status: DC
  Administered 2012-06-22 – 2012-06-23 (×2): 15 mL via ORAL
  Filled 2012-06-22 (×21): qty 15

## 2012-06-22 MED ORDER — HYDROMORPHONE 0.3 MG/ML IV SOLN: INTRAVENOUS | Status: DC

## 2012-06-22 MED ORDER — KETOROLAC TROMETHAMINE 15 MG/ML IJ SOLN
30.0000 mg | Freq: Four times a day (QID) | INTRAMUSCULAR | Status: DC
  Administered 2012-06-22 – 2012-06-24 (×5): 30 mg via INTRAVENOUS
  Filled 2012-06-22: qty 1
  Filled 2012-06-22 (×6): qty 2
  Filled 2012-06-22: qty 1
  Filled 2012-06-22 (×6): qty 2

## 2012-06-22 MED ORDER — ALUM & MAG HYDROXIDE-SIMETH 200-200-20 MG/5ML PO SUSP: 30.0000 mL | Freq: Four times a day (QID) | ORAL | Status: DC | PRN

## 2012-06-22 MED ORDER — LIP MEDEX EX OINT
1.0000 "application " | TOPICAL_OINTMENT | Freq: Two times a day (BID) | CUTANEOUS | Status: DC
  Administered 2012-06-22 – 2012-06-25 (×6): 1 via TOPICAL
  Filled 2012-06-22 (×2): qty 7

## 2012-06-22 MED ORDER — DIPHENHYDRAMINE HCL 12.5 MG/5ML PO ELIX: 12.5000 mg | ORAL_SOLUTION | Freq: Four times a day (QID) | ORAL | Status: DC | PRN

## 2012-06-22 NOTE — Progress Notes (Signed)
[facility] PROGRESS NOTE  David Conrad NWG:[id number] DOB: April 26, 1971 DOA: 06/14/2012 PCP: Sheila Oats, MD  Assessment/Plan: 1. Colonic mass/adenocarcinoma: Patient presented with gradually progressive abdominal pain, change in bowel habit and fatigue. Abdominal/pelvic CT demonstrated a 7.8 x 5.6 cm right colonic mass, compatible with primary colonic adenocarcinoma, and suspected local extension into the surrounding pericolonic fat. There was associated ileocolic lymphadenopathy measuring up to 3.8 x 6.3 cm. CEA tumor marker is elevated at 77. Dr Charna Elizabeth provided GI consultation, and colonoscopic evaluation/biopsy was carried out on 06/17/12. Pathology shows adenocarcinoma. Dr Axel Filler provided surgical consultation, and surgery done on 10/30.  Heme onc consulted for chemo- appreciate Dr. Rexene Edison . 2. Anemia: This is mild, microcytic, likely due to chronic disease/iron deficiency anemia from occult GI blood loss. Anemia panel has confirmed iron deficiency, with iron of 13 and ferritin of 5. FOBT is positive.   3. Dyspnea on exertion: Likely secondary to anemia. Improved somewhat.  CXR Shows possible atelectasis vs infiltrate left base. D-Dimer was elevated at 2.51, but CTA showed no pulmonary embolism or pneumonic consolidation. Patient is apyrexial, wcc is normal, he has no respiratory tract symptoms and is not hypoxic or tachycardic. 2D Echocardiogram revealed normal LV cavity size, EF of 55% to 60% and no regional wall motion abnormalities. PA peak pressure: 35mm Hg (S). Patient has been reassured accordingly.   4. Tobacco abuse: Patient smokes about a half-pack of cigarettes per day. He has been counseled appropriately, and placed on Nicoderm CQ.     Code Status: full Family Communication: pt at bedside Disposition Plan: home when stable   Consultants:  Surgery  GI  Procedures:  Abdominal/pelvic CT scan -    right hemicolectomy with ileotransverse colon  anastomosis. 10/30   Antibiotics:  N/A  HPI/Subjective: Feels "gurgling in abdomen" No flatus No BM   Objective: Filed Vitals:   06/22/12 0137 06/22/12 0418 06/22/12 0556 06/22/12 0858  BP: 117/73  118/72   Pulse: 92  85   Temp: 98.6 F (37 C)  98.4 F (36.9 C)   TempSrc: Oral  Oral   Resp: 17 18 25 18   Height:      Weight:   79.878 kg (176 lb 1.6 oz)   SpO2: 97% 98% 97% 97%    Intake/Output Summary (Last 24 hours) at 06/22/12 0949 Last data filed at 06/22/12 0700  Gross per 24 hour  Intake   1400 ml  Output   2600 ml  Net  -1200 ml   Filed Weights   06/19/12 0451 06/21/12 0607 06/22/12 0556  Weight: 78.5 kg (173 lb 1 oz) 80.4 kg (177 lb 4 oz) 79.878 kg (176 lb 1.6 oz)    Exam:   General:   alert oriented  Cardiovascular: RRR No MGR No LEE PPP  Respiratory: normal effort. No wheeze No rhonchi  Abdomen: covered, tender, no BS  Data Reviewed: Basic Metabolic Panel:  Lab 06/22/12 5784 06/20/12 0510 06/19/12 1518 06/19/12 0640 06/18/12 0555 06/17/12 0615 06/16/12 0555  NA -- 134* -- 132* 138 138 138  K -- 3.6 -- 3.5 4.0 3.8 3.8  CL -- 96 -- 95* 100 103 102  CO2 -- 30 -- 21 24 22 25   GLUCOSE -- 169* -- 86 83 64* 76  BUN -- 3* -- 6 5* 6 6  CREATININE 0.74 0.75 0.78 0.89 1.02 -- --  CALCIUM -- 9.0 -- 9.4 9.6 9.1 8.9  MG -- -- -- -- -- -- --  PHOS -- -- -- -- -- -- --  Liver Function Tests:  Lab 06/17/12 0615 06/16/12 0555  AST 10 10  ALT 8 8  ALKPHOS 69 72  BILITOT 0.3 0.3  PROT 6.7 6.5  ALBUMIN 2.9* 2.7*   No results found for this basename: LIPASE:5,AMYLASE:5 in the last 168 hours No results found for this basename: AMMONIA:5 in the last 168 hours CBC:  Lab 06/20/12 0510 06/19/12 1518 06/17/12 0615 06/16/12 0555  WBC 9.1 10.2 7.2 6.6  NEUTROABS -- -- -- --  HGB 9.6* 10.0* 9.3* 9.3*  HCT 31.9* 32.3* 31.1* 30.3*  MCV 63.5* 63.6* 65.1* 65.4*  PLT 440* 394 409* 415*   Cardiac Enzymes: No results found for this basename:  CKTOTAL:5,CKMB:5,CKMBINDEX:5,TROPONINI:5 in the last 168 hours BNP (last 3 results) No results found for this basename: PROBNP:3 in the last 8760 hours CBG: No results found for this basename: GLUCAP:5 in the last 168 hours  Recent Results (from the past 240 hour(s))  SURGICAL PCR SCREEN     Status: Normal   Collection Time   06/18/12  3:40 PM      Component Value Range Status Comment   MRSA, PCR NEGATIVE  NEGATIVE Final    Staphylococcus aureus NEGATIVE  NEGATIVE Final      Studies: No results found.  Scheduled Meds:    . chlorhexidine  15 mL Mouth Rinse BID  . ketorolac  30 mg Intravenous Q6H  . ketorolac      . ketorolac      . lip balm  1 application Topical BID  . magic mouthwash  15 mL Oral QID  . nicotine  14 mg Transdermal Daily  . DISCONTD: antiseptic oral rinse  15 mL Mouth Rinse q12n4p  . DISCONTD: enoxaparin  40 mg Subcutaneous Q24H  . DISCONTD: HYDROmorphone PCA 0.3 mg/mL   Intravenous Q4H  . DISCONTD: HYDROmorphone PCA 0.3 mg/mL   Intravenous Q4H  . DISCONTD: pantoprazole (PROTONIX) IV  40 mg Intravenous Q24H   Continuous Infusions:    . dextrose 5% lactated ringers 50 mL/hr at 06/22/12 0857    Principal Problem:  *Colon cancer, ascending, pT3, pN2a (6/18 LN) , pMX  Active Problems:  Anemia  Dyspnea on exertion  Tobacco abuse  Pharyngitis    Time spent: 30 minutes    Ameir Faria  [facility] [phone]  If 8PM-8AM, please contact night-coverage at [email or web address], password [facility] 06/22/2012, 9:49 AM  LOS: 8 days

## 2012-06-22 NOTE — Progress Notes (Signed)
David Conrad [id number] 04-26-1971   Subjective:  Sore throat Pain controlled No events  Objective:  Vital signs:  Filed Vitals:   06/22/12 0030 06/22/12 0137 06/22/12 0418 06/22/12 0556  BP:  117/73  118/72  Pulse:  92  85  Temp:  98.6 F (37 C)  98.4 F (36.9 C)  TempSrc:  Oral  Oral  Resp: 21 17 18 25   Height:      Weight:    176 lb 1.6 oz (79.878 kg)  SpO2: 98% 97% 98% 97%    Last BM Date: 06/19/12  Intake/Output   Yesterday:  11/01 0701 - 11/02 0700 In: 1400 [I.V.:1400] Out: 2600 [Urine:1100; Emesis/NG output:1500] This shift:     Bowel function:  Flatus: n  BM: n  Physical Exam:  General: Pt awake/alert/oriented x4 in no acute distress Eyes: PERRL, normal EOM.  Sclera clear.  No icterus Neuro: CN II-XII intact w/o focal sensory/motor deficits. Lymph: No head/neck/groin lymphadenopathy Psych:  No delerium/psychosis/paranoia HENT: Normocephalic, Mucus membranes moist.  No thrush.  NGT in place - sensitive Neck: Supple, No tracheal deviation Chest: No chest wall pain w good excursion CV:  Pulses intact.  Regular rhythm Abdomen: Soft.  Flat Nondistended.  Mildly tender at incisions only.  No incarcerated hernias. Ext:  SCDs BLE.  No mjr edema.  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 male  3 Days Post-Op  Procedure(s): COLON RESECTION LAPAROSCOPIC COLON RESECTION RIGHT  Ileus persists  Plan:  -NGT until ileus resolves -palliate pharyngitis from NGT better -wean PCA and use IV toradol -VTE prophylaxis- SCDs, etc -mobilize as tolerated to help recovery  Ardeth Sportsman, M.D., F.A.C.S. Gastrointestinal and Minimally Invasive Surgery [facility] [address], [address] [place], [place] [address] [phone] Main /  Paging [phone] Voice Mail   06/22/2012  CARE TEAM:  PCP: David Oats, MD  Outpatient Care Team: Patient Care Team: Provider Default, MD as PCP - General Md Ccs as Consulting Physician (General Surgery)  Inpatient Treatment Team: Treatment Team: Attending Provider: Joseph Art, DO; Rounding Team: Barrie Dunker, MD; Technician: Maricela Curet, NT; Technician: Charlott Holler, NT; Registered Nurse: Wynn Maudlin, RN; Consulting Physician: Bishop Limbo, MD; Technician: Lowell Bouton, NT; Physician Assistant: Marcos Eke, PA; Consulting Physician: Josph Macho, MD; Technician: Star Age, NT; Registered Nurse: Rudene Anda, RN; Registered Nurse: Allene Pyo, RN; Technician: Carman Ching, NT   Results:   Labs: No results found for this or any previous visit (from the past 48 hour(s)).  Imaging / Studies: No results found.  Medications / Allergies: per chart  Antibiotics: Anti-infectives     Start     Dose/Rate Route Frequency Ordered Stop   06/19/12 0600   ertapenem (INVANZ) 1 g in sodium chloride 0.9 % 50 mL IVPB        1 g 100 mL/hr over 30 Minutes Intravenous On call to O.R. 06/18/12 1305 06/19/12 3474

## 2012-06-23 DIAGNOSIS — R911 Solitary pulmonary nodule: Secondary | ICD-10-CM | POA: Diagnosis present

## 2012-06-23 DIAGNOSIS — E876 Hypokalemia: Secondary | ICD-10-CM | POA: Diagnosis not present

## 2012-06-23 LAB — CBC
MCH: 19.1 pg — ABNORMAL LOW (ref 26.0–34.0)
MCHC: 29.6 g/dL — ABNORMAL LOW (ref 30.0–36.0)
MCV: 64.6 fL — ABNORMAL LOW (ref 78.0–100.0)
Platelets: 467 10*3/uL — ABNORMAL HIGH (ref 150–400)
RDW: 17.5 % — ABNORMAL HIGH (ref 11.5–15.5)
WBC: 6 10*3/uL (ref 4.0–10.5)

## 2012-06-23 LAB — MAGNESIUM: Magnesium: 1.7 mg/dL (ref 1.5–2.5)

## 2012-06-23 LAB — BASIC METABOLIC PANEL
Calcium: 9.5 mg/dL (ref 8.4–10.5)
Chloride: 101 mEq/L (ref 96–112)
Creatinine, Ser: 0.81 mg/dL (ref 0.50–1.35)
GFR calc Af Amer: >90 mL/min (ref >90)
GFR calc non Af Amer: >90 mL/min (ref >90)

## 2012-06-23 MED ORDER — POTASSIUM CHLORIDE CRYS ER 20 MEQ PO TBCR: 40.0000 meq | EXTENDED_RELEASE_TABLET | Freq: Two times a day (BID) | ORAL | Status: DC

## 2012-06-23 MED ORDER — POTASSIUM CHLORIDE 10 MEQ/100ML IV SOLN: 10.0000 meq | INTRAVENOUS | Status: DC

## 2012-06-23 MED ORDER — POTASSIUM CHLORIDE CRYS ER 20 MEQ PO TBCR
40.0000 meq | EXTENDED_RELEASE_TABLET | Freq: Two times a day (BID) | ORAL | Status: AC
  Administered 2012-06-23 – 2012-06-24 (×4): 40 meq via ORAL
  Filled 2012-06-23 (×4): qty 2

## 2012-06-23 MED ORDER — KCL IN DEXTROSE-NACL 40-5-0.9 MEQ/L-%-% IV SOLN
INTRAVENOUS | Status: DC
  Administered 2012-06-23 – 2012-06-25 (×3): via INTRAVENOUS
  Filled 2012-06-23 (×4): qty 1000

## 2012-06-23 NOTE — Progress Notes (Addendum)
[facility] PROGRESS NOTE  David Conrad ZOX:[id number] DOB: [birth year] DOA: 06/14/2012 PCP: David Oats, MD  Assessment/Plan: 1. Colonic mass/adenocarcinoma: Patient presented with gradually progressive abdominal pain, change in bowel habit and fatigue. Abdominal/pelvic CT demonstrated a 7.8 x 5.6 cm right colonic mass, compatible with primary colonic adenocarcinoma, and suspected local extension into the surrounding pericolonic fat. There was associated ileocolic lymphadenopathy measuring up to 3.8 x 6.3 cm. CEA tumor marker is elevated at 77. Dr David Conrad provided GI consultation, and colonoscopic evaluation/biopsy was carried out on 06/17/12. Pathology shows adenocarcinoma. Dr David Conrad provided surgical consultation, and surgery done on 10/30.  Surgery to allow sips of liquid, mobilize Heme onc consulted for chemo- appreciate Dr. Rexene Conrad- patient wants port-a cath as outpatient . 2. Anemia: This is mild, microcytic, likely due to chronic disease/iron deficiency anemia from occult GI blood loss. Anemia panel has confirmed iron deficiency, with iron of 13 and ferritin of 5. FOBT is positive.   3. Dyspnea on exertion: Likely secondary to anemia. Improved somewhat.  CXR Shows possible atelectasis vs infiltrate left base. D-Dimer was elevated at 2.51, but CTA showed no pulmonary embolism or pneumonic consolidation. Patient is apyrexial, wcc is normal, he has no respiratory tract symptoms and is not hypoxic or tachycardic. 2D Echocardiogram revealed normal LV cavity size, EF of 55% to 60% and no regional wall motion abnormalities. PA peak pressure: 35mm Hg (S). Patient has been reassured accordingly.   4. Tobacco abuse: Patient smokes about a half-pack of cigarettes per day. He has been counseled appropriately, and placed on Nicoderm CQ  5. Hypokalemia- replace.     Code Status: full Family Communication: pt at bedside Disposition Plan: home when  stable   Consultants:  Surgery  GI  Procedures:  Abdominal/pelvic CT scan -    right hemicolectomy with ileotransverse colon anastomosis. 10/30   Antibiotics:  N/A  HPI/Subjective: NGT tube out +BM Walking the halls  Objective: Filed Vitals:   06/22/12 0858 06/22/12 1130 06/22/12 2119 06/23/12 0533  BP:  124/78 134/87 105/66  Pulse:  92 100 105  Temp:  98.4 F (36.9 C) 99.1 F (37.3 C) 98.9 F (37.2 C)  TempSrc:  Oral Oral   Resp: 18 16 17 17   Height:      Weight:      SpO2: 97% 99% 100% 96%    Intake/Output Summary (Last 24 hours) at 06/23/12 0937 Last data filed at 06/22/12 2300  Gross per 24 hour  Intake 2948.34 ml  Output   1575 ml  Net 1373.34 ml   Filed Weights   06/19/12 0451 06/21/12 0607 06/22/12 0556  Weight: 78.5 kg (173 lb 1 oz) 80.4 kg (177 lb 4 oz) 79.878 kg (176 lb 1.6 oz)    Exam:   General:   alert oriented  Cardiovascular: RRR No MGR No LEE PPP  Respiratory: normal effort. No wheeze No rhonchi  Abdomen: covered, tender, no BS  Data Reviewed: Basic Metabolic Panel:  Lab 06/23/12 8119 06/22/12 0850 06/20/12 0510 06/19/12 1518 06/19/12 0640 06/18/12 0555 06/17/12 0615  NA 142 -- 134* -- 132* 138 138  K 3.0* -- 3.6 -- 3.5 4.0 3.8  CL 101 -- 96 -- 95* 100 103  CO2 33* -- 30 -- 21 24 22   GLUCOSE 114* -- 169* -- 86 83 64*  BUN 7 -- 3* -- 6 5* 6  CREATININE 0.81 0.74 0.75 0.78 0.89 -- --  CALCIUM 9.5 -- 9.0 -- 9.4 9.6 9.1  MG -- -- -- -- -- -- --  PHOS -- -- -- -- -- -- --   Liver Function Tests:  Lab 06/17/12 0615  AST 10  ALT 8  ALKPHOS 69  BILITOT 0.3  PROT 6.7  ALBUMIN 2.9*   No results found for this basename: LIPASE:5,AMYLASE:5 in the last 168 hours No results found for this basename: AMMONIA:5 in the last 168 hours CBC:  Lab 06/23/12 0640 06/20/12 0510 06/19/12 1518 06/17/12 0615  WBC 6.0 9.1 10.2 7.2  NEUTROABS -- -- -- --  HGB 8.8* 9.6* 10.0* 9.3*  HCT 29.7* 31.9* 32.3* 31.1*  MCV 64.6* 63.5* 63.6*  65.1*  PLT 467* 440* 394 409*   Cardiac Enzymes: No results found for this basename: CKTOTAL:5,CKMB:5,CKMBINDEX:5,TROPONINI:5 in the last 168 hours BNP (last 3 results) No results found for this basename: PROBNP:3 in the last 8760 hours CBG: No results found for this basename: GLUCAP:5 in the last 168 hours  Recent Results (from the past 240 hour(s))  SURGICAL PCR SCREEN     Status: Normal   Collection Time   06/18/12  3:40 PM      Component Value Range Status Comment   MRSA, PCR NEGATIVE  NEGATIVE Final    Staphylococcus aureus NEGATIVE  NEGATIVE Final      Studies: No results found.  Scheduled Meds:    . chlorhexidine  15 mL Mouth Rinse BID  . ketorolac  30 mg Intravenous Q6H  . [EXPIRED] ketorolac      . [EXPIRED] ketorolac      . lip balm  1 application Topical BID  . magic mouthwash  15 mL Oral QID  . nicotine  14 mg Transdermal Daily  . potassium chloride  40 mEq Oral BID  . [DISCONTINUED] potassium chloride  10 mEq Intravenous Q1 Hr x 4  . [DISCONTINUED] potassium chloride  40 mEq Oral BID   Continuous Infusions:    . dextrose 5 % and 0.9 % NaCl with KCl 40 mEq/L    . 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

## 2012-06-23 NOTE — Progress Notes (Signed)
Pt has tolerated having his NG tube out today without difficulty, no nausea or vomiting.  Pt has tolerated clear liquid diet well and will advance to full liquids for breakfast tomorrow am as per order.  Pt without c/o pain,has ambulated multiple times around the unit without difficulty.

## 2012-06-23 NOTE — Progress Notes (Signed)
David Conrad [id number] 03/12/1971   Subjective:  Pulled NGT out in middle of night on accident ("It was twisted around my arm when I woke up") Pain controlled Walking well  Objective:  Vital signs:  Filed Vitals:   06/22/12 0858 06/22/12 1130 06/22/12 2119 06/23/12 0533  BP:  124/78 134/87 105/66  Pulse:  92 100 105  Temp:  98.4 F (36.9 C) 99.1 F (37.3 C) 98.9 F (37.2 C)  TempSrc:  Oral Oral   Resp: 18 16 17 17   Height:      Weight:      SpO2: 97% 99% 100% 96%    Last BM Date: 06/19/12  Intake/Output   Yesterday:  11/02 0701 - 11/03 0700 In: 2948.3 [I.V.:2948.3] Out: 1575 [Urine:850; Emesis/NG output:725] This shift:     Bowel function:  Flatus: LOTS (witnessed by me)  BM: n  Physical Exam:  General: Pt awake/alert/oriented x4 in no acute distress.  Moves in room easily Eyes: PERRL, normal EOM.  Sclera clear.  No icterus Neuro: CN II-XII intact w/o focal sensory/motor deficits. Lymph: No head/neck/groin lymphadenopathy Psych:  No delerium/psychosis/paranoia HENT: Normocephalic, Mucus membranes moist.  No thrush.  NGT in place - sensitive Neck: Supple, No tracheal deviation Chest: No chest wall pain w good excursion CV:  Pulses intact.  Regular rhythm Abdomen: Soft.  Flat Nondistended.  Mildly tender at incisions only.  No incarcerated hernias. Ext:  SCDs BLE.  No mjr edema.  No cyanosis Skin: No petechiae / purpurae  Problem List:  Principal Problem:  *Colon cancer, ascending, pT3, pN2a (6/18 LN) , pMX  Active Problems:  Anemia  Dyspnea on exertion  Tobacco abuse  Pharyngitis   Assessment  David Conrad  41 y.o. male  4 Days Post-Op  Procedure(s): COLON RESECTION LAPAROSCOPIC COLON RESECTION RIGHT  Ileus resolving  Plan:  -hold replacing NGT  -try sips/liquids -max not narcotic pain control -low K - correct & check mag level -VTE prophylaxis- SCDs, etc -mobilize as tolerated to help recovery -port a cath as outpatient per  his wishes  Ardeth Sportsman, M.D., F.A.C.S. Gastrointestinal and Minimally Invasive Surgery [facility] [address] [place], [place] [address] [phone] Main / Paging [phone] Voice Mail   06/23/2012  CARE TEAM:  PCP: Sheila Oats, MD  Outpatient Care Team: Patient Care Team: Provider Default, MD as PCP - General Md Ccs as Consulting Physician (General Surgery)  Inpatient Treatment Team: Treatment Team: Attending Provider: Joseph Art, DO; Rounding Team: Barrie Dunker, MD; Technician: Charlott Holler, NT; Registered Nurse: Wynn Maudlin, RN; Consulting Physician: Bishop Limbo, MD; Technician: Lowell Bouton, NT; Physician Assistant: Marcos Eke, PA; Consulting Physician: Josph Macho, MD; Technician: Star Age, NT; Registered Nurse: Rudene Anda, RN; Technician: Carman Ching, NT; Technician: Chauncey Reading, [place]; Registered Nurse: Laureen Ochs, RN   Results:   Labs: Results for orders placed during the hospital encounter of 06/14/12 (from the past 48 hour(s))  CREATININE, SERUM     Status: Normal   Collection Time   06/22/12  8:50 AM      Component Value Range Comment   Creatinine, Ser 0.74  0.50 - 1.35 mg/dL    GFR calc non Af Amer >90  >90 mL/min    GFR calc Af Amer >90  >90 mL/min   CBC     Status: Abnormal   Collection Time   06/23/12  6:40 AM  Component Value Range Comment   WBC 6.0  4.0 - 10.5 K/uL    RBC 4.60  4.22 - 5.81 MIL/uL    Hemoglobin 8.8 (*) 13.0 - 17.0 g/dL    HCT 16.1 (*) 09.6 - 52.0 %    MCV 64.6 (*) 78.0 - 100.0 fL    MCH 19.1 (*) 26.0 - 34.0 pg    MCHC 29.6 (*) 30.0 - 36.0 g/dL    RDW 04.5 (*) 40.9 - 15.5 %    Platelets 467 (*) 150 - 400 K/uL   BASIC METABOLIC PANEL     Status: Abnormal   Collection Time   06/23/12  6:40 AM      Component Value Range Comment   Sodium 142  135 - 145 mEq/L    Potassium 3.0 (*) 3.5 - 5.1 mEq/L    Chloride 101  96 - 112 mEq/L     CO2 33 (*) 19 - 32 mEq/L    Glucose, Bld 114 (*) 70 - 99 mg/dL    BUN 7  6 - 23 mg/dL    Creatinine, Ser 8.11  0.50 - 1.35 mg/dL    Calcium 9.5  8.4 - 91.4 mg/dL    GFR calc non Af Amer >90  >90 mL/min    GFR calc Af Amer >90  >90 mL/min     Imaging / Studies: No results found.  Medications / Allergies: per chart  Antibiotics: Anti-infectives     Start     Dose/Rate Route Frequency Ordered Stop   06/19/12 0600   ertapenem (INVANZ) 1 g in sodium chloride 0.9 % 50 mL IVPB        1 g 100 mL/hr over 30 Minutes Intravenous On call to O.R. 06/18/12 1305 06/19/12 7829

## 2012-06-24 ENCOUNTER — Encounter (INDEPENDENT_AMBULATORY_CARE_PROVIDER_SITE_OTHER): Payer: Self-pay

## 2012-06-24 ENCOUNTER — Encounter (HOSPITAL_COMMUNITY): Payer: Self-pay

## 2012-06-24 MED ORDER — HYDROCODONE-ACETAMINOPHEN 5-325 MG PO TABS
1.0000 | ORAL_TABLET | ORAL | Status: DC | PRN
  Administered 2012-06-24 – 2012-06-25 (×2): 1 via ORAL
  Filled 2012-06-24 (×2): qty 1

## 2012-06-24 MED ORDER — MAGIC MOUTHWASH
15.0000 mL | ORAL | Status: DC | PRN
Start: 2012-06-24 — End: 2012-06-25
  Filled 2012-06-24: qty 15

## 2012-06-24 NOTE — Progress Notes (Signed)
Patient ID: David Conrad, male   DOB: January 21, 1971, 41 y.o.   MRN: [id number] 5 Days Post-Op  Subjective: Pt feels well this morning.  Tolerating full liquid diet without nausea.  Having flatus and BM.  Objective: Vital signs in last 24 hours: Temp:  [98.4 F (36.9 C)-98.7 F (37.1 C)] 98.5 F (36.9 C) (11/04 0641) Pulse Rate:  [89-92] 92  (11/04 0641) Resp:  [18] 18  (11/04 0641) BP: (117-122)/(68-73) 122/68 mmHg (11/04 0641) SpO2:  [99 %-100 %] 100 % (11/04 0641) Last BM Date: 06/23/12  Intake/Output from previous day: 11/03 0701 - 11/04 0700 In: 2842 [P.O.:1640; I.V.:1202] Out: 6 [Urine:3; Stool:3] Intake/Output this shift:    PE: Abd: soft, appropriately tender, +BS, ND, incision c/d/i with steri-strips intact.  Lab Results:   [facility] 06/23/12 0640  WBC 6.0  HGB 8.8*  HCT 29.7*  PLT 467*   BMET  Basename 06/23/12 0640 06/22/12 0850  NA 142 --  K 3.0* --  CL 101 --  CO2 33* --  GLUCOSE 114* --  BUN 7 --  CREATININE 0.81 0.74  CALCIUM 9.5 --   PT/INR No results found for this basename: LABPROT:2,INR:2 in the last 72 hours CMP     Component Value Date/Time   NA 142 06/23/2012 0640   K 3.0* 06/23/2012 0640   CL 101 06/23/2012 0640   CO2 33* 06/23/2012 0640   GLUCOSE 114* 06/23/2012 0640   BUN 7 06/23/2012 0640   CREATININE 0.81 06/23/2012 0640   CALCIUM 9.5 06/23/2012 0640   PROT 6.7 06/17/2012 0615   ALBUMIN 2.9* 06/17/2012 0615   AST 10 06/17/2012 0615   ALT 8 06/17/2012 0615   ALKPHOS 69 06/17/2012 0615   BILITOT 0.3 06/17/2012 0615   GFRNONAA >90 06/23/2012 0640   GFRAA >90 06/23/2012 0640   Lipase     Component Value Date/Time   LIPASE 17 06/14/2012 2018       Studies/Results: No results found.  Anti-infectives: Anti-infectives     Start     Dose/Rate Route Frequency Ordered Stop   06/19/12 0600   ertapenem (INVANZ) 1 g in sodium chloride 0.9 % 50 mL IVPB        1 g 100 mL/hr over 30 Minutes Intravenous On call to O.R. 06/18/12 1305  06/19/12 4098           Assessment/Plan  1. S/p ileocecectomy for adenoCA, stage 3  Plan: 1. Advance to regular diet today. 2. Patient would like to hold off on placement of PAC during this admission.  Would like to consider this as an outpatient. 3. Oral pain meds prn 4. Hopefully home tomorrow   LOS: 10 days    Aalani Aikens E 06/24/2012, 8:18 AM Pager: [phone]

## 2012-06-24 NOTE — Progress Notes (Signed)
[facility] PROGRESS NOTE  David Conrad ZOX:[id number] DOB: 03/28/1971 DOA: 06/14/2012 PCP: Sheila Oats, MD  Assessment/Plan: 1. Colonic mass/adenocarcinoma: Patient presented with gradually progressive abdominal pain, change in bowel habit and fatigue. Abdominal/pelvic CT demonstrated a 7.8 x 5.6 cm right colonic mass, compatible with primary colonic adenocarcinoma, and suspected local extension into the surrounding pericolonic fat. There was associated ileocolic lymphadenopathy measuring up to 3.8 x 6.3 cm. CEA tumor marker is elevated at 77. Dr Charna Elizabeth provided GI consultation, and colonoscopic evaluation/biopsy was carried out on 06/17/12. Pathology shows adenocarcinoma. Dr Axel Filler provided surgical consultation, and surgery done on 10/30.  Surgery to allow sips of liquid, mobilize: Heme onc consulted for chemo- appreciate Dr. Rexene Edison- patient wants port-a cath as outpatient . 2. Anemia: This is mild, microcytic, likely due to chronic disease/iron deficiency anemia from occult GI blood loss. Anemia panel has confirmed iron deficiency, with iron of 13 and ferritin of 5. FOBT is positive.   3. Dyspnea on exertion: Likely secondary to anemia. Improved somewhat.  CXR Shows possible atelectasis vs infiltrate left base. D-Dimer was elevated at 2.51, but CTA showed no pulmonary embolism or pneumonic consolidation. Patient is apyrexial, wcc is normal, he has no respiratory tract symptoms and is not hypoxic or tachycardic. 2D Echocardiogram revealed normal LV cavity size, EF of 55% to 60% and no regional wall motion abnormalities. PA peak pressure: 35mm Hg (S). Patient has been reassured accordingly- better   4. Tobacco abuse: Patient smokes about a half-pack of cigarettes per day. He has been counseled appropriately, and placed on Nicoderm CQ  5. Hypokalemia- replace.     Code Status: full Family Communication: pt at bedside Disposition Plan: home when  stable   Consultants:  Surgery  GI  Procedures:  Abdominal/pelvic CT scan -    right hemicolectomy with ileotransverse colon anastomosis. 10/30   Antibiotics:  N/A  HPI/Subjective: eating regular meals No new c/o   Objective: Filed Vitals:   06/23/12 0533 06/23/12 1405 06/23/12 2201 06/24/12 0641  BP: 105/66 117/73 120/70 122/68  Pulse: 105 91 89 92  Temp: 98.9 F (37.2 C) 98.4 F (36.9 C) 98.7 F (37.1 C) 98.5 F (36.9 C)  TempSrc:  Oral    Resp: 17 18 18 18   Height:      Weight:      SpO2: 96% 99% 100% 100%    Intake/Output Summary (Last 24 hours) at 06/24/12 0935 Last data filed at 06/24/12 0601  Gross per 24 hour  Intake   2842 ml  Output      6 ml  Net   2836 ml   Filed Weights   06/19/12 0451 06/21/12 0607 06/22/12 0556  Weight: 78.5 kg (173 lb 1 oz) 80.4 kg (177 lb 4 oz) 79.878 kg (176 lb 1.6 oz)    Exam:   General:   alert oriented  Cardiovascular: RRR No MGR No LEE PPP  Respiratory: normal effort. No wheeze No rhonchi  Abdomen: covered, tender, no BS  Data Reviewed: Basic Metabolic Panel:  Lab 06/23/12 8119 06/22/12 0850 06/20/12 0510 06/19/12 1518 06/19/12 0640 06/18/12 0555  NA 142 -- 134* -- 132* 138  K 3.0* -- 3.6 -- 3.5 4.0  CL 101 -- 96 -- 95* 100  CO2 33* -- 30 -- 21 24  GLUCOSE 114* -- 169* -- 86 83  BUN 7 -- 3* -- 6 5*  CREATININE 0.81 0.74 0.75 0.78 0.89 --  CALCIUM 9.5 -- 9.0 -- 9.4 9.6  MG 1.7 -- -- -- -- --  PHOS -- -- -- -- -- --   Liver Function Tests: No results found for this basename: AST:5,ALT:5,ALKPHOS:5,BILITOT:5,PROT:5,ALBUMIN:5 in the last 168 hours No results found for this basename: LIPASE:5,AMYLASE:5 in the last 168 hours No results found for this basename: AMMONIA:5 in the last 168 hours CBC:  Lab 06/23/12 0640 06/20/12 0510 06/19/12 1518  WBC 6.0 9.1 10.2  NEUTROABS -- -- --  HGB 8.8* 9.6* 10.0*  HCT 29.7* 31.9* 32.3*  MCV 64.6* 63.5* 63.6*  PLT 467* 440* 394   Cardiac Enzymes: No  results found for this basename: CKTOTAL:5,CKMB:5,CKMBINDEX:5,TROPONINI:5 in the last 168 hours BNP (last 3 results) No results found for this basename: PROBNP:3 in the last 8760 hours CBG: No results found for this basename: GLUCAP:5 in the last 168 hours  Recent Results (from the past 240 hour(s))  SURGICAL PCR SCREEN     Status: Normal   Collection Time   06/18/12  3:40 PM      Component Value Range Status Comment   MRSA, PCR NEGATIVE  NEGATIVE Final    Staphylococcus aureus NEGATIVE  NEGATIVE Final      Studies: No results found.  Scheduled Meds:    . ketorolac  30 mg Intravenous Q6H  . lip balm  1 application Topical BID  . nicotine  14 mg Transdermal Daily  . potassium chloride  40 mEq Oral BID  . [DISCONTINUED] chlorhexidine  15 mL Mouth Rinse BID  . [DISCONTINUED] magic mouthwash  15 mL Oral QID   Continuous Infusions:    . dextrose 5 % and 0.9 % NaCl with KCl 40 mEq/L 50 mL/hr at 06/24/12 0601    Principal Problem:  *Colon cancer, ascending, pT3, pN2a (6/18 LN) , pMX  Active Problems:  Anemia  Dyspnea on exertion  Tobacco abuse  Pharyngitis  Hypokalemia  Incidental lung nodule, less than or equal to 3mm, RLL    Time spent: 30 minutes    Musa Rewerts  [facility] [phone]  If 8PM-8AM, please contact night-coverage at [email or web address], password [facility] 06/24/2012, 9:35 AM  LOS: 10 days

## 2012-06-24 NOTE — Progress Notes (Signed)
D/C patient from hospital when patient meets criteria (hopefully tomorrow):  Tolerating oral intake well Ambulating in walkways Adequate pain control without IV medications Urinating  Having flatus  -continue colon cancer care as outpt per his wishes

## 2012-06-25 LAB — BASIC METABOLIC PANEL
BUN: 8 mg/dL (ref 6–23)
Calcium: 9.5 mg/dL (ref 8.4–10.5)
Chloride: 104 mEq/L (ref 96–112)
Creatinine, Ser: 0.94 mg/dL (ref 0.50–1.35)
GFR calc Af Amer: >90 mL/min (ref >90)
GFR calc non Af Amer: >90 mL/min (ref >90)

## 2012-06-25 LAB — CBC
MCH: 18.5 pg — ABNORMAL LOW (ref 26.0–34.0)
MCHC: 28.6 g/dL — ABNORMAL LOW (ref 30.0–36.0)
MCV: 64.7 fL — ABNORMAL LOW (ref 78.0–100.0)
Platelets: 474 10*3/uL — ABNORMAL HIGH (ref 150–400)
RDW: 17.8 % — ABNORMAL HIGH (ref 11.5–15.5)
WBC: 5 10*3/uL (ref 4.0–10.5)

## 2012-06-25 MED ORDER — NICOTINE 14 MG/24HR TD PT24: 1.0000 | MEDICATED_PATCH | Freq: Every day | TRANSDERMAL | Status: DC

## 2012-06-25 MED ORDER — HYDROCODONE-ACETAMINOPHEN 5-325 MG PO TABS: 1.0000 | ORAL_TABLET | ORAL | Status: DC | PRN

## 2012-06-25 NOTE — Progress Notes (Signed)
D/C patient from hospital since patient meets criteria:  Tolerating oral intake well Ambulating in walkways Adequate pain control without IV medications Urinating  Having flatus

## 2012-06-25 NOTE — Progress Notes (Signed)
Discharge home.

## 2012-06-25 NOTE — Discharge Summary (Signed)
Physician Discharge Summary  David Conrad ZOX:[id number] DOB: 1970/10/13 DOA: 06/14/2012  PCP: David Oats, MD  Admit date: 06/14/2012 Discharge date: 06/25/2012  Time spent: 35 minutes  Recommendations for Outpatient Follow-up:  1. Dr. Myna Conrad   Discharge Diagnoses:  Principal Problem:  *Colon cancer, ascending, pT3, pN2a (6/18 LN) , pMX  Active Problems:  Anemia  Dyspnea on exertion  Tobacco abuse  Pharyngitis  Hypokalemia  Incidental lung nodule, less than or equal to 3mm, RLL   Discharge Condition: improved  Diet recommendation: advance as tolerated  Filed Weights   06/22/12 0556 06/24/12 2100 06/25/12 0500  Weight: 79.878 kg (176 lb 1.6 oz) 79.47 kg (175 lb 3.2 oz) 79.878 kg (176 lb 1.6 oz)    History of present illness:  David Conrad is a 41 y.o. male  has no past medical history on file.  Presented with  1 year hx of intermittent abdominal worse after eating. He also noted thinner stools. He has had fatigue and some dyspnea on exertion. No chest pain. NO blood in stool. What brought him into ER was severe fatigue and worsening abdominal pain. He denies any nausea vomiting. Patient's denying any family history of colonic cancer.   Hospital Course:  1. Colonic mass/adenocarcinoma: Patient presented with gradually progressive abdominal pain, change in bowel habit and fatigue. Abdominal/pelvic CT demonstrated a 7.8 x 5.6 cm right colonic mass, compatible with primary colonic adenocarcinoma, and suspected local extension into the surrounding pericolonic fat. There was associated ileocolic lymphadenopathy measuring up to 3.8 x 6.3 cm. CEA tumor marker is elevated at 77. Dr David Conrad provided GI consultation, and colonoscopic evaluation/biopsy was carried out on 06/17/12. Pathology shows adenocarcinoma. Dr David Conrad provided surgical consultation, and surgery done on 10/30. Surgery to allow sips of liquid, mobilize: Heme onc consulted for chemo- appreciate  Dr. Rexene Conrad- patient wants port-a cath as outpatient  . 2. Anemia: This is mild, microcytic, likely due to chronic disease/iron deficiency anemia from occult GI blood loss. Anemia panel has confirmed iron deficiency, with iron of 13 and ferritin of 5. FOBT is positive.   3. Dyspnea on exertion: Likely secondary to anemia. Improved somewhat. CXR Shows possible atelectasis vs infiltrate left base. D-Dimer was elevated at 2.51, but CTA showed no pulmonary embolism or pneumonic consolidation. Patient is apyrexial, wcc is normal, he has no respiratory tract symptoms and is not hypoxic or tachycardic. 2D Echocardiogram revealed normal LV cavity size, EF of 55% to 60% and no regional wall motion abnormalities. PA peak pressure: 35mm Hg (S). Patient has been reassured accordingly- better   4. Tobacco abuse: Patient smokes about a half-pack of cigarettes per day. He has been counseled appropriately, and placed on Nicoderm CQ - will need to follow with PCP for taper  5. Hypokalemia- replace.       Procedures:  Colonoscopy -     exploratory laparotomy, lysis of adhesions, right hemicolectomy with ileotransverse colon anastomosis.   Consultations:  Surgery  Heme onc  Discharge Exam: Filed Vitals:   06/24/12 2100 06/24/12 2303 06/25/12 0500 06/25/12 0544  BP:  122/89  101/61  Pulse:  89  75  Temp:  98.4 F (36.9 C)  97.9 F (36.6 C)  TempSrc:  Oral  Oral  Resp:  18  18  Height: 5\' 7"  (1.702 m)     Weight: 79.47 kg (175 lb 3.2 oz)  79.878 kg (176 lb 1.6 oz)   SpO2:  100%  99%    General: A+Ox3, NAD Cardiovascular: rrr Respiratory: clear  b/L GI: +BS, soft  Discharge Instructions      Discharge Orders    Future Orders Please Complete By Expires   Diet general      Increase activity slowly      Call MD for:  temperature >100.4      Call MD for:  persistant nausea and vomiting      Call MD for:  severe uncontrolled pain      Call MD for:  redness, tenderness, or signs of  infection (pain, swelling, redness, odor or green/yellow discharge around incision site)      Discharge instructions      Comments:   Follow up with Dr. Rexene Conrad       Medication List     As of 06/25/2012 11:15 AM    TAKE these medications         HYDROcodone-acetaminophen 5-325 MG per tablet   Commonly known as: NORCO/VICODIN   Take 1-2 tablets by mouth every 4 (four) hours as needed.      nicotine 14 mg/24hr patch   Commonly known as: NICODERM CQ - dosed in mg/24 hours   Place 1 patch onto the skin daily.         Follow-up Information    Follow up with David Macho, MD. In 3 weeks.   Contact information:   [address] [place] [address] [phone]       Please follow up. (will need to establish with a PCP)       Follow up with David Saver, MD. Schedule an appointment as soon as possible for a visit in 3 weeks.   Contact information:   [address] [place] [address] [phone]           The results of significant diagnostics from this hospitalization (including imaging, microbiology, ancillary and laboratory) are listed below for reference.    Significant Diagnostic Studies: Dg Chest 2 View  06/15/2012  *RADIOLOGY REPORT*  Clinical Data: Shortness of breath.  CHEST - 2 VIEW  Comparison: None.  Findings: Shallow inspiration.  Normal heart size and pulmonary vascularity.  Infiltration or atelectasis in the left lung base. No blunting of costophrenic angles.  No pneumothorax.  Mediastinal contours appear intact.  IMPRESSION: Shallow inspiration with infiltration or atelectasis in the left lung base.   Original Report Authenticated By: Marlon Pel, M.D.    Ct Angio Chest Pe W/cm &/or Wo Cm  06/15/2012  *RADIOLOGY REPORT*  Clinical Data: Dyspnea on exertion, elevated D-dimer  CT ANGIOGRAPHY CHEST  Technique:  Multidetector CT imaging of the chest using the standard protocol during bolus administration of  intravenous contrast. Multiplanar reconstructed images including MIPs were obtained and reviewed to evaluate the vascular anatomy.  Contrast:  80 ml Omnipaque-300 IV  Comparison: Chest radiographs dated 06/15/2012  Findings: No evidence of pulmonary embolism.  Linear scarring/atelectasis in the bilateral lower lobes.   3 mm nodule along the right major fissure (series 5/image 38).  No pleural effusion or pneumothorax.  Visualized thyroid is unremarkable.  The heart is normal in size.  No pericardial effusion.  No suspicious mediastinal, hilar, or axillary lymphadenopathy.  Visualized upper abdomen is unremarkable.  Visualized osseous structures are within normal limits.  IMPRESSION: No evidence of pulmonary embolism.  No evidence of acute cardiopulmonary disease.  3 mm nodule along the right major fissure, likely benign.  In the setting of suspected colonic malignancy, attention on follow-up is suggested.   Original Report Authenticated  By: Charline Bills, M.D.    Ct Abdomen Pelvis W Contrast  06/14/2012  *RADIOLOGY REPORT*  Clinical Data: Right abdominal pain  CT ABDOMEN AND PELVIS WITH CONTRAST  Technique:  Multidetector CT imaging of the abdomen and pelvis was performed following the standard protocol during bolus administration of intravenous contrast.  Contrast: OMNIPAQUE IOHEXOL 300 MG/ML  SOLN  Comparison: None.  Findings: Linear scarring/atelectasis in the bilateral lower lobes.  11 mm probable cyst in the posterior segment right hepatic lobe (series 2/image 28).  Spleen, pancreas, and adrenal glands are within normal limits.  Gallbladder is unremarkable.  No intrahepatic or extrahepatic ductal dilatation.  Kidneys are within normal limits.  No hydronephrosis.  No evidence of bowel obstruction.  7.8 x 5.6 cm right colonic mass (series 2/image 43).  Suspected local extension into the surrounding pericolonic fat.  Associated matted ileocolic lymphadenopathy measuring up to 3.8 x 6.3 cm (series  2/image 47).  No evidence of abdominal aortic aneurysm.  No abdominopelvic ascites.  Prostate is top normal in size and indents the base of the bladder.  Bladder is unremarkable.  Mild degenerative changes of the visualized thoracolumbar spine.  IMPRESSION: 7.8 x 5.6 cm right colonic mass, compatible with primary colonic adenocarcinoma.  Suspected local extension into the surrounding pericolonic fat.  Associated ileocolic lymphadenopathy measuring up to 3.8 x 6.3 cm.  These results were called by telephone on 06/14/2012 at 1144 hrs to Dr. Linwood Dibbles, who verbally acknowledged these results.   Original Report Authenticated By: Charline Bills, M.D.     Microbiology: Recent Results (from the past 240 hour(s))  SURGICAL PCR SCREEN     Status: Normal   Collection Time   06/18/12  3:40 PM      Component Value Range Status Comment   MRSA, PCR NEGATIVE  NEGATIVE Final    Staphylococcus aureus NEGATIVE  NEGATIVE Final      Labs: Basic Metabolic Panel:  Lab 06/25/12 4540 06/23/12 0640 06/22/12 0850 06/20/12 0510 06/19/12 1518 06/19/12 0640  NA 140 142 -- 134* -- 132*  K 4.0 3.0* -- 3.6 -- 3.5  CL 104 101 -- 96 -- 95*  CO2 28 33* -- 30 -- 21  GLUCOSE 105* 114* -- 169* -- 86  BUN 8 7 -- 3* -- 6  CREATININE 0.94 0.81 0.74 0.75 0.78 --  CALCIUM 9.5 9.5 -- 9.0 -- 9.4  MG -- 1.7 -- -- -- --  PHOS -- -- -- -- -- --   Liver Function Tests: No results found for this basename: AST:5,ALT:5,ALKPHOS:5,BILITOT:5,PROT:5,ALBUMIN:5 in the last 168 hours No results found for this basename: LIPASE:5,AMYLASE:5 in the last 168 hours No results found for this basename: AMMONIA:5 in the last 168 hours CBC:  Lab 06/25/12 0555 06/23/12 0640 06/20/12 0510 06/19/12 1518  WBC 5.0 6.0 9.1 10.2  NEUTROABS -- -- -- --  HGB 9.0* 8.8* 9.6* 10.0*  HCT 31.5* 29.7* 31.9* 32.3*  MCV 64.7* 64.6* 63.5* 63.6*  PLT 474* 467* 440* 394   Cardiac Enzymes: No results found for this basename:  CKTOTAL:5,CKMB:5,CKMBINDEX:5,TROPONINI:5 in the last 168 hours BNP: BNP (last 3 results) No results found for this basename: PROBNP:3 in the last 8760 hours CBG: No results found for this basename: GLUCAP:5 in the last 168 hours     Signed:  Marlin Canary  [facility] 06/25/2012, 11:15 AM

## 2012-06-25 NOTE — Progress Notes (Signed)
Patient ID: David Conrad, male   DOB: [birth year], 41 y.o.   MRN: [id number] 6 Days Post-Op  Subjective: Pt feels great today.  Ready to go home.  Tolerating solid diet.  Having several BMs.  Objective: Vital signs in last 24 hours: Temp:  [97.8 F (36.6 C)-98.4 F (36.9 C)] 97.9 F (36.6 C) (11/05 0544) Pulse Rate:  [75-102] 75  (11/05 0544) Resp:  [18-20] 18  (11/05 0544) BP: (101-122)/(61-89) 101/61 mmHg (11/05 0544) SpO2:  [99 %-100 %] 99 % (11/05 0544) Weight:  [175 lb 3.2 oz (79.47 kg)-176 lb 1.6 oz (79.878 kg)] 176 lb 1.6 oz (79.878 kg) (11/05 0500) Last BM Date: 06/24/12  Intake/Output from previous day: 11/04 0701 - 11/05 0700 In: 1169.2 [P.O.:720; I.V.:449.2] Out: 1 [Stool:1] Intake/Output this shift:    PE: Abd: soft, appropriately tender, +BS, incisions c/d/i with steri strips  Lab Results:   Basename 06/25/12 0555 06/23/12 0640  WBC 5.0 6.0  HGB 9.0* 8.8*  HCT 31.5* 29.7*  PLT 474* 467*   BMET  Basename 06/25/12 0555 06/23/12 0640  NA 140 142  K 4.0 3.0*  CL 104 101  CO2 28 33*  GLUCOSE 105* 114*  BUN 8 7  CREATININE 0.94 0.81  CALCIUM 9.5 9.5   PT/INR No results found for this basename: LABPROT:2,INR:2 in the last 72 hours CMP     Component Value Date/Time   NA 140 06/25/2012 0555   K 4.0 06/25/2012 0555   CL 104 06/25/2012 0555   CO2 28 06/25/2012 0555   GLUCOSE 105* 06/25/2012 0555   BUN 8 06/25/2012 0555   CREATININE 0.94 06/25/2012 0555   CALCIUM 9.5 06/25/2012 0555   PROT 6.7 06/17/2012 0615   ALBUMIN 2.9* 06/17/2012 0615   AST 10 06/17/2012 0615   ALT 8 06/17/2012 0615   ALKPHOS 69 06/17/2012 0615   BILITOT 0.3 06/17/2012 0615   GFRNONAA >90 06/25/2012 0555   GFRAA >90 06/25/2012 0555   Lipase     Component Value Date/Time   LIPASE 17 06/14/2012 2018       Studies/Results: No results found.  Anti-infectives: Anti-infectives     Start     Dose/Rate Route Frequency Ordered Stop   06/19/12 0600   ertapenem (INVANZ) 1 g in  sodium chloride 0.9 % 50 mL IVPB        1 g 100 mL/hr over 30 Minutes Intravenous On call to O.R. 06/18/12 1305 06/19/12 4098           Assessment/Plan  1. S/p ileocecectomy for adenoCA of colon, stage 3  Plan: 1. Plan for dc home today.  Follow up with Dr. Derrell Lolling in 3 weeks in our office. 2. Follow up with Dr. Myna Hidalgo as outpatient as well.   LOS: 11 days    David Conrad 06/25/2012, 9:56 AM Pager: [phone]

## 2012-07-01 ENCOUNTER — Telehealth: Payer: Self-pay | Admitting: Hematology & Oncology

## 2012-07-01 ENCOUNTER — Other Ambulatory Visit: Payer: Self-pay | Admitting: Hematology & Oncology

## 2012-07-01 DIAGNOSIS — C182 Malignant neoplasm of ascending colon: Secondary | ICD-10-CM

## 2012-07-01 DIAGNOSIS — D649 Anemia, unspecified: Secondary | ICD-10-CM

## 2012-07-01 NOTE — Telephone Encounter (Signed)
Pt aware of 07-08-12 appointment

## 2012-07-08 ENCOUNTER — Ambulatory Visit (HOSPITAL_BASED_OUTPATIENT_CLINIC_OR_DEPARTMENT_OTHER): Payer: Medicaid Other | Admitting: Hematology & Oncology

## 2012-07-08 ENCOUNTER — Other Ambulatory Visit (HOSPITAL_BASED_OUTPATIENT_CLINIC_OR_DEPARTMENT_OTHER): Payer: Medicaid Other | Admitting: Lab

## 2012-07-08 ENCOUNTER — Other Ambulatory Visit: Payer: Self-pay | Admitting: Hematology & Oncology

## 2012-07-08 ENCOUNTER — Other Ambulatory Visit: Payer: Self-pay | Admitting: *Deleted

## 2012-07-08 ENCOUNTER — Other Ambulatory Visit: Payer: Self-pay | Admitting: Radiology

## 2012-07-08 ENCOUNTER — Ambulatory Visit: Payer: Medicaid Other

## 2012-07-08 VITALS — BP 118/68 | HR 76 | Temp 98.1°F | Resp 18 | Ht 67.0 in | Wt 166.0 lb

## 2012-07-08 DIAGNOSIS — C779 Secondary and unspecified malignant neoplasm of lymph node, unspecified: Secondary | ICD-10-CM

## 2012-07-08 DIAGNOSIS — C182 Malignant neoplasm of ascending colon: Secondary | ICD-10-CM

## 2012-07-08 DIAGNOSIS — C189 Malignant neoplasm of colon, unspecified: Secondary | ICD-10-CM

## 2012-07-08 DIAGNOSIS — D649 Anemia, unspecified: Secondary | ICD-10-CM

## 2012-07-08 LAB — CBC WITH DIFFERENTIAL (CANCER CENTER ONLY)
BASO#: 0 10*3/uL (ref 0.0–0.2)
Eosinophils Absolute: 0.2 10*3/uL (ref 0.0–0.5)
HCT: 35.1 % — ABNORMAL LOW (ref 38.7–49.9)
HGB: 10.6 g/dL — ABNORMAL LOW (ref 13.0–17.1)
LYMPH%: 36 % (ref 14.0–48.0)
MCH: 19.4 pg — ABNORMAL LOW (ref 28.0–33.4)
MCV: 64 fL — ABNORMAL LOW (ref 82–98)
MONO#: 0.5 10*3/uL (ref 0.1–0.9)
MONO%: 13.5 % — ABNORMAL HIGH (ref 0.0–13.0)
RBC: 5.45 10*6/uL (ref 4.20–5.70)
WBC: 4 10*3/uL (ref 4.0–10.0)

## 2012-07-08 MED ORDER — ONDANSETRON HCL 8 MG PO TABS: 8.0000 mg | ORAL_TABLET | Freq: Two times a day (BID) | ORAL | Status: DC

## 2012-07-08 MED ORDER — PROCHLORPERAZINE MALEATE 10 MG PO TABS: 10.0000 mg | ORAL_TABLET | Freq: Four times a day (QID) | ORAL | Status: DC | PRN

## 2012-07-08 MED ORDER — LORAZEPAM 0.5 MG PO TABS: 0.5000 mg | ORAL_TABLET | Freq: Four times a day (QID) | ORAL | Status: DC | PRN

## 2012-07-08 MED ORDER — LIDOCAINE-PRILOCAINE 2.5-2.5 % EX CREA: TOPICAL_CREAM | CUTANEOUS | Status: DC | PRN

## 2012-07-08 MED ORDER — DEXAMETHASONE 4 MG PO TABS: 8.0000 mg | ORAL_TABLET | Freq: Two times a day (BID) | ORAL | Status: DC

## 2012-07-08 NOTE — Progress Notes (Signed)
This office note has been dictated.

## 2012-07-08 NOTE — Progress Notes (Signed)
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 He had 6 positive lymph nodes.  His tumor was fairly large but not invasion into local structures.  Of note, his tumor was micro satellite stable, which is a more negative prognostic marker.  The K-RAS has not been done yet.  He still has some discomfort.  His appetite is coming back.  Of note, he did have a preop CEA which was elevated at 77.  When he was initially staged, he did not have any obvious metastatic disease.  He did have a 3 mm nodule along the right major fissure. Everything else looked okay.  There is no evidence of hepatic metastases.  Everything is overwhelming him so far.  He is just having a tough time trying to cope with what is going on.  There is no family history.  He has no obvious risk factors as far as I can tell.  PHYSICAL EXAMINATION:  This is a thin black gentleman in no obvious distress.  Vital signs:  98.1, pulse 76, respiratory rate 18, blood pressure 118/68.  Weight is 166.  Head/neck:  Normocephalic, atraumatic skull.  There are no ocular or oral lesions.  He has no cervical or supraclavicular lymph nodes.  Lungs:  Clear bilaterally.  Cardiac: Regular rate and rhythm with a normal S1 and S2.  There are no murmurs, rubs or bruits.  Abdomen:  Soft with good bowel sounds.  There is no palpable abdominal mass.  There is no palpable hepatosplenomegaly.  He has a laparotomy scar that is healing.  Extremities:  No clubbing, cyanosis or edema.  Neurologic:  No focal neurological deficit.  LABORATORY:  White count 4, hemoglobin 10.6, hematocrit 35.1, platelet count 427.  MCV is 64.  IMPRESSION:  Mr. Gladwin is a 41 year old gentleman with stage IIIC colon cancer.  He had 6 positive lymph nodes.  His tumor is microsatellite stable.  Clearly, I believe that without adjuvant chemotherapy, his cancer will come back.  The fact that his preop CEA is so high is also an  ominous factor.  We will have to see what his CEA level is now.  His surgery was back on the 28th of October, so hopefully we will find that his CEA is normalized.  If his CEA is still elevated, we may have to do a PET scan on him to see if we can find any areas of occult metastasis.  He is a candidate for his adjuvant chemotherapy.  I think FolFox would certainly be favorable for him.  I think he could tolerate FolFox.  He will need to have a Port-A-Cath in.  We did arrange for him to have this done tomorrow.  I want to get him started on treatment on the 20th of November.  I want to try and get his treatment started before 4 weeks from the surgery.  Again, everything is quite overwhelming for Mr. Callicott.  His wife is with him.  She is trying to help him through this as much possible.  They have 2 boys already.  She is now expecting twins.  Again, this weighs heavy on Mr. Droke.  I told patient that we will certainly be as aggressive as we can.  It is still unclear if he has any obvious metastatic disease.  One would think, however, that he is clearly at a high risk for metastatic disease, given the number of positive lymph nodes, the size of the tumor, and the preop CEA being  so elevated.  I went over the side effects of chemotherapy with Mr. Schueler and his wife.  I explained to them the nature of chemotherapy.  I think that having the Port-A-Cath in will just make life easier for him.  We will, again, try to get started on the 28th.  He wants to try to feel good for Thanksgiving.  I think if we get started this week, he will feel better.  It would not surprise me if his K-RAS is mutated.  We will plan to get him back to see [place] in another 2 weeks.  We will try to move him along every 2 weeks on schedule with maintenance of a good dose level.    ______________________________ Josph Macho, M.D. PRE/MEDQ  D:  07/08/2012  T:  07/08/2012  Job:  [id number]

## 2012-07-09 ENCOUNTER — Ambulatory Visit (HOSPITAL_COMMUNITY)
Admission: RE | Admit: 2012-07-09 | Discharge: 2012-07-09 | Disposition: A | Discharge status: Home / Self Care | Payer: Medicaid Other | Source: Ambulatory Visit | Attending: Hematology & Oncology | Admitting: Hematology & Oncology

## 2012-07-09 ENCOUNTER — Encounter (HOSPITAL_COMMUNITY): Payer: Self-pay

## 2012-07-09 ENCOUNTER — Other Ambulatory Visit: Payer: Self-pay | Admitting: Hematology & Oncology

## 2012-07-09 DIAGNOSIS — C189 Malignant neoplasm of colon, unspecified: Secondary | ICD-10-CM

## 2012-07-09 DIAGNOSIS — F17200 Nicotine dependence, unspecified, uncomplicated: Secondary | ICD-10-CM | POA: Insufficient documentation

## 2012-07-09 DIAGNOSIS — Z9049 Acquired absence of other specified parts of digestive tract: Secondary | ICD-10-CM | POA: Insufficient documentation

## 2012-07-09 LAB — IRON AND TIBC: Iron: 45 ug/dL (ref 42–165)

## 2012-07-09 LAB — COMPREHENSIVE METABOLIC PANEL
Albumin: 4.1 g/dL (ref 3.5–5.2)
Alkaline Phosphatase: 89 U/L (ref 39–117)
BUN: 10 mg/dL (ref 6–23)
CO2: 28 mEq/L (ref 19–32)
Glucose, Bld: 88 mg/dL (ref 70–99)
Potassium: 4.7 mEq/L (ref 3.5–5.3)
Sodium: 140 mEq/L (ref 135–145)
Total Bilirubin: 0.4 mg/dL (ref 0.3–1.2)
Total Protein: 6.9 g/dL (ref 6.0–8.3)

## 2012-07-09 LAB — PROTIME-INR: INR: 1.06 (ref 0.00–1.49)

## 2012-07-09 LAB — PREALBUMIN: Prealbumin: 32.4 mg/dL (ref 17.0–34.0)

## 2012-07-09 MED ORDER — CEFAZOLIN SODIUM 1-5 GM-% IV SOLN
1.0000 g | INTRAVENOUS | Status: AC
  Administered 2012-07-09: 1 g via INTRAVENOUS
  Filled 2012-07-09: qty 50

## 2012-07-09 MED ORDER — MIDAZOLAM HCL 2 MG/2ML IJ SOLN
INTRAMUSCULAR | Status: AC
  Filled 2012-07-09: qty 4

## 2012-07-09 MED ORDER — HEPARIN SOD (PORK) LOCK FLUSH 100 UNIT/ML IV SOLN
500.0000 [IU] | Freq: Once | INTRAVENOUS | Status: AC
  Administered 2012-07-09: 500 [IU] via INTRAVENOUS

## 2012-07-09 MED ORDER — DIAZEPAM 5 MG PO TABS
ORAL_TABLET | ORAL | Status: AC
  Filled 2012-07-09: qty 1

## 2012-07-09 MED ORDER — DIAZEPAM 5 MG PO TABS
5.0000 mg | ORAL_TABLET | Freq: Once | ORAL | Status: AC
  Administered 2012-07-09: 5 mg via ORAL

## 2012-07-09 MED ORDER — LIDOCAINE HCL 1 % IJ SOLN
INTRAMUSCULAR | Status: AC
  Filled 2012-07-09: qty 20

## 2012-07-09 MED ORDER — FENTANYL CITRATE 0.05 MG/ML IJ SOLN
INTRAMUSCULAR | Status: AC | PRN
  Administered 2012-07-09: 50 ug via INTRAVENOUS
  Administered 2012-07-09: 100 ug via INTRAVENOUS
  Administered 2012-07-09: 50 ug via INTRAVENOUS

## 2012-07-09 MED ORDER — SODIUM CHLORIDE 0.9 % IV SOLN: INTRAVENOUS | Status: DC

## 2012-07-09 MED ORDER — MIDAZOLAM HCL 2 MG/2ML IJ SOLN
INTRAMUSCULAR | Status: AC | PRN
  Administered 2012-07-09 (×4): 1 mg via INTRAVENOUS

## 2012-07-09 MED ORDER — FENTANYL CITRATE 0.05 MG/ML IJ SOLN
INTRAMUSCULAR | Status: AC
  Filled 2012-07-09: qty 4

## 2012-07-09 NOTE — H&P (Signed)
Agree with PA note.  Will proceed with placement of a right IJ portacatheter.   Signed,  Sterling Big, MD Vascular & Interventional Radiologist [facility]

## 2012-07-09 NOTE — Procedures (Signed)
Interventional Radiology Procedure Note  Procedure: Placement of a right IJ approach single lumen PowerPort.  Tip is positioned at the superior cavoatrial junction and catheter is ready for immediate use.  Complications: No immediate Recommendations:  - Ok to shower tomorrow - Do not submerge for 7 days - Routine line care   Signed,  Heath K. McCullough, MD Vascular & Interventional Radiologist [facility]  

## 2012-07-09 NOTE — H&P (Signed)
Chief Complaint: Colon cancer Referring Physician:Ennever HPI: David Conrad is an 41 y.o. male who was found to have a colon cancer. He is s/p resection and has seen Oncology, who has recommended adjuvant chemotherapy. He will need portacath for this and is scheduled today for procedure. He is otherwise healing well from his recent surgery, no infectious issues. PMHx and meds reviewed.  Past Medical History:  Past Medical History  Diagnosis Date  . Anemia     Past Surgical History:  Past Surgical History  Procedure Date  . Colonoscopy 06/17/2012    Procedure: COLONOSCOPY;  Surgeon: Charna Elizabeth, MD;  Location: [facility] ENDOSCOPY;  Service: Endoscopy;  Laterality: N/A;  . Colon resection 06/19/2012    Procedure: COLON RESECTION LAPAROSCOPIC;  Surgeon: Axel Filler, MD;  Location: [facility] OR;  Service: General;  Laterality: Right;  . Colostomy revision 06/19/2012    Procedure: COLON RESECTION RIGHT;  Surgeon: Axel Filler, MD;  Location: [facility] OR;  Service: General;  Laterality: Right;    Family History:  Family History  Problem Relation Age of Onset  . Alcohol abuse Father     Social History:  reports that he has been smoking.  He has never used smokeless tobacco. He reports that he does not drink alcohol or use illicit drugs.  Allergies: No Known Allergies  Medications: Ativan prn, Zofran prn, Norco prn  Please HPI for pertinent positives, otherwise complete 10 system ROS negative.  Physical Exam: Blood pressure 110/76, pulse 80, temperature 98.1 F (36.7 C), temperature source Oral, resp. rate 20, SpO2 98.00%. There is no height or weight on file to calculate BMI.   General Appearance:  Alert, cooperative, no distress, appears stated age  Head:  Normocephalic, without obvious abnormality, atraumatic  ENT: Unremarkable  Neck: Supple, symmetrical, trachea midline, no adenopathy, thyroid: not enlarged, symmetric, no tenderness/mass/nodules  Lungs:   Clear to auscultation  bilaterally, no w/r/r, respirations unlabored without use of accessory muscles.  Heart:  Regular rate and rhythm, S1, S2 normal, no murmur, rub or gallop. Carotids 2+ without bruit.  Abdomen:   Soft, non-tender, non distended. Midline incision healing well, no signs of infection  Neurologic: Normal affect, no gross deficits.   Results for orders placed in visit on 07/08/12 (from the past 48 hour(s))  IRON AND TIBC     Status: Abnormal   Collection Time   07/08/12  1:24 PM      Component Value Range Comment   Iron 45  42 - 165 ug/dL    UIBC 960  454 - 098 ug/dL    TIBC 119  147 - 829 ug/dL    %SAT 12 (*) 20 - 55 %   FERRITIN     Status: Abnormal   Collection Time   07/08/12  1:24 PM      Component Value Range Comment   Ferritin 17 (*) 22 - 322 ng/mL   CEA     Status: Abnormal   Collection Time   07/08/12  1:24 PM      Component Value Range Comment   CEA 10.0 (*) 0.0 - 5.0 ng/mL   CBC WITH DIFFERENTIAL ([facility])     Status: Abnormal   Collection Time   07/08/12  1:24 PM      Component Value Range Comment   WBC 4.0  4.0 - 10.0 10e3/uL    RBC 5.45  4.20 - 5.70 10e6/uL    HGB 10.6 (*) 13.0 - 17.1 g/dL    HCT 56.2 (*) 13.0 -  49.9 %    MCV 64 (*) 82 - 98 fL    MCH 19.4 (*) 28.0 - 33.4 pg    MCHC 30.2 (*) 32.0 - 35.9 g/dL    RDW 16.1 (*) 09.6 - 15.7 %    Platelets 427 (*) 145 - 400 10e3/uL    NEUT# 1.8  1.5 - 6.5 10e3/uL    LYMPH# 1.4  0.9 - 3.3 10e3/uL    MONO# 0.5  0.1 - 0.9 10e3/uL    Eosinophils Absolute 0.2  0.0 - 0.5 10e3/uL    BASO# 0.0  0.0 - 0.2 10e3/uL    NEUT% 44.2  40.0 - 80.0 %    LYMPH% 36.0  14.0 - 48.0 %    MONO% 13.5 (*) 0.0 - 13.0 %    EOS% 5.8  0.0 - 7.0 %    BASO% 0.5  0.0 - 2.0 %   COMPREHENSIVE METABOLIC PANEL     Status: Normal   Collection Time   07/08/12  1:24 PM      Component Value Range Comment   Sodium 140  135 - 145 mEq/L    Potassium 4.7  3.5 - 5.3 mEq/L    Chloride 102  96 - 112 mEq/L    CO2 28  19 - 32 mEq/L    Glucose, Bld 88  70  - 99 mg/dL    BUN 10  6 - 23 mg/dL    Creatinine, Ser 0.45  0.50 - 1.35 mg/dL    Total Bilirubin 0.4  0.3 - 1.2 mg/dL    Alkaline Phosphatase 89  39 - 117 U/L    AST 23  0 - 37 U/L    ALT 44  0 - 53 U/L    Total Protein 6.9  6.0 - 8.3 g/dL    Albumin 4.1  3.5 - 5.2 g/dL    Calcium 40.9  8.4 - 10.5 mg/dL   PREALBUMIN     Status: Normal   Collection Time   07/08/12  1:24 PM      Component Value Range Comment   Prealbumin 32.4  17.0 - 34.0 mg/dL    No results found.  Assessment/Plan Colon cancer For portacath placement. Discussed procedure and risks. Labs reviewed. Consent signed in chart  Brayton El PA-C 07/09/2012, 11:52 AM

## 2012-07-10 ENCOUNTER — Other Ambulatory Visit (HOSPITAL_COMMUNITY)
Admission: RE | Admit: 2012-07-10 | Discharge: 2012-07-10 | Disposition: A | Discharge status: Home / Self Care | Payer: Medicaid Other | Source: Ambulatory Visit | Attending: Hematology & Oncology | Admitting: Hematology & Oncology

## 2012-07-10 ENCOUNTER — Ambulatory Visit (HOSPITAL_BASED_OUTPATIENT_CLINIC_OR_DEPARTMENT_OTHER): Payer: Medicaid Other

## 2012-07-10 DIAGNOSIS — Z5111 Encounter for antineoplastic chemotherapy: Secondary | ICD-10-CM

## 2012-07-10 DIAGNOSIS — C189 Malignant neoplasm of colon, unspecified: Secondary | ICD-10-CM | POA: Insufficient documentation

## 2012-07-10 DIAGNOSIS — C182 Malignant neoplasm of ascending colon: Secondary | ICD-10-CM

## 2012-07-10 MED ORDER — SODIUM CHLORIDE 0.9 % IV SOLN
1020.0000 mg | Freq: Once | INTRAVENOUS | Status: DC
  Filled 2012-07-10: qty 34

## 2012-07-10 MED ORDER — SODIUM CHLORIDE 0.9 % IV SOLN
2400.0000 mg/m2 | INTRAVENOUS | Status: DC
  Administered 2012-07-10: 4550 mg via INTRAVENOUS
  Filled 2012-07-10: qty 91

## 2012-07-10 MED ORDER — LEUCOVORIN CALCIUM INJECTION 350 MG
400.0000 mg/m2 | Freq: Once | INTRAVENOUS | Status: AC
  Administered 2012-07-10: 756 mg via INTRAVENOUS
  Filled 2012-07-10: qty 37.8

## 2012-07-10 MED ORDER — SODIUM CHLORIDE 0.9 % IJ SOLN
10.0000 mL | INTRAMUSCULAR | Status: DC | PRN
  Filled 2012-07-10: qty 10

## 2012-07-10 MED ORDER — MORPHINE SULFATE 4 MG/ML IJ SOLN
5.0000 mg | Freq: Once | INTRAMUSCULAR | Status: AC
  Administered 2012-07-10: 5 mg via SUBCUTANEOUS

## 2012-07-10 MED ORDER — DEXAMETHASONE SODIUM PHOSPHATE 10 MG/ML IJ SOLN
10.0000 mg | Freq: Once | INTRAMUSCULAR | Status: AC
  Administered 2012-07-10: 10 mg via INTRAVENOUS

## 2012-07-10 MED ORDER — DEXTROSE 5 % IV SOLN
Freq: Once | INTRAVENOUS | Status: AC
  Administered 2012-07-10: 12:00:00 via INTRAVENOUS

## 2012-07-10 MED ORDER — PALONOSETRON HCL INJECTION 0.25 MG/5ML
0.2500 mg | Freq: Once | INTRAVENOUS | Status: AC
  Administered 2012-07-10: 0.25 mg via INTRAVENOUS

## 2012-07-10 MED ORDER — LORAZEPAM 1 MG PO TABS
1.0000 mg | ORAL_TABLET | Freq: Once | ORAL | Status: AC
  Administered 2012-07-10: 1 mg via ORAL

## 2012-07-10 MED ORDER — FLUOROURACIL CHEMO INJECTION 2.5 GM/50ML
400.0000 mg/m2 | Freq: Once | INTRAVENOUS | Status: AC
  Administered 2012-07-10: 750 mg via INTRAVENOUS
  Filled 2012-07-10: qty 15

## 2012-07-10 MED ORDER — ONDANSETRON 8 MG/50ML IVPB (CHCC): 8.0000 mg | Freq: Once | INTRAVENOUS | Status: DC

## 2012-07-10 MED ORDER — OXALIPLATIN CHEMO INJECTION 100 MG/20ML
85.0000 mg/m2 | Freq: Once | INTRAVENOUS | Status: AC
  Administered 2012-07-10: 160 mg via INTRAVENOUS
  Filled 2012-07-10: qty 32

## 2012-07-10 MED ORDER — HEPARIN SOD (PORK) LOCK FLUSH 100 UNIT/ML IV SOLN
500.0000 [IU] | Freq: Once | INTRAVENOUS | Status: DC | PRN
  Filled 2012-07-10: qty 5

## 2012-07-10 NOTE — Patient Instructions (Addendum)
[facility] Discharge Instructions for Patients Receiving Chemotherapy  Today you received the following chemotherapy agents Oxaliplatin, 5FU To help prevent nausea and vomiting after your treatment, we encourage you to take your nausea medication   1) Zofran  (Ondansetron) 8 mg by mouth one in am and one in pm beginning 3 days after chemotherapy (Saturday 07/13/12).   Take this for 3 days.  This is to help control nausea.  2)Compazine  (Prochlorperazine) Take 10 mg by mouth every 6 hours as needed for nausea or vomiting,  3) Decadron  (Dexamethasone) 4 mg.  Take 2 tablets by mouth 2 times daily with a meal.  Begin taking it the day after chemotherapy for 2 days.    4) Ativan (Lorazepam)- Take .5 mg by mouth or under tongue every 6 hours as needed for nausea, or for anxiety.     If you develop nausea and vomiting that is not controlled by your nausea medication, call the clinic [phone] If it is after clinic hours your family physician or the after hours number for the clinic or go to the Emergency Department.   BELOW ARE SYMPTOMS THAT SHOULD BE REPORTED IMMEDIATELY:  *FEVER GREATER THAN 100.5 F  *CHILLS WITH OR WITHOUT FEVER  NAUSEA AND VOMITING THAT IS NOT CONTROLLED WITH YOUR NAUSEA MEDICATION  *UNUSUAL SHORTNESS OF BREATH  *UNUSUAL BRUISING OR BLEEDING  TENDERNESS IN MOUTH AND THROAT WITH OR WITHOUT PRESENCE OF ULCERS  *URINARY PROBLEMS  *BOWEL PROBLEMS  UNUSUAL RASH Items with * indicate a potential emergency and should be followed up as soon as possible.  One of the nurses will contact you 24 hours after your treatment. Please let the nurse know about any problems that you may have experienced. Feel free to call the clinic you have any questions or concerns. The clinic phone number is [phone]   I have been informed and understand all the instructions given to me. I know to contact the clinic, my physician, or go to the Emergency Department if any  problems should occur. I do not have any questions at this time, but understand that I may call the clinic during office hours   should I have any questions or need assistance in obtaining follow up care.    __________________________________________  _____________  __________ Signature of Patient or Authorized Representative            Date                   Time    __________________________________________ Nurse's Signature         Leucovorin injection What is this medicine? LEUCOVORIN (loo koe VOR in) is used to prevent or treat the harmful effects of some medicines. This medicine is used to treat anemia caused by a low amount of folic acid in the body. It is also used with 5-fluorouracil (5-FU) to treat colon cancer. This medicine may be used for other purposes; ask your health care provider or pharmacist if you have questions. What should I tell my health care provider before I take this medicine? They need to know if you have any of these conditions: -anemia from low levels of vitamin B-12 in the blood -an unusual or allergic reaction to leucovorin, folic acid, other medicines, foods, dyes, or preservatives -pregnant or trying to get pregnant -breast-feeding How should I use this medicine? This medicine is for injection into a muscle or into a vein. It is given by a health care professional in a  hospital or clinic setting. Talk to your pediatrician regarding the use of this medicine in children. Special care may be needed. Overdosage: If you think you have taken too much of this medicine contact a poison control center or emergency room at once. NOTE: This medicine is only for you. Do not share this medicine with others. What if I miss a dose? This does not apply. What may interact with this medicine? -capecitabine -fluorouracil -phenobarbital -phenytoin -primidone -trimethoprim-sulfamethoxazole This list may not describe all possible interactions. Give your health  care provider a list of all the medicines, herbs, non-prescription drugs, or dietary supplements you use. Also tell them if you smoke, drink alcohol, or use illegal drugs. Some items may interact with your medicine. What should I watch for while using this medicine? Your condition will be monitored carefully while you are receiving this medicine. This medicine may increase the side effects of 5-fluorouracil, 5-FU. Tell your doctor or health care professional if you have diarrhea or mouth sores that do not get better or that get worse. What side effects may I notice from receiving this medicine? Side effects that you should report to your doctor or health care professional as soon as possible: -allergic reactions like skin rash, itching or hives, swelling of the face, lips, or tongue -breathing problems -fever, infection -mouth sores -unusual bleeding or bruising -unusually weak or tired Side effects that usually do not require medical attention (report to your doctor or health care professional if they continue or are bothersome): -constipation or diarrhea -loss of appetite -nausea, vomiting This list may not describe all possible side effects. Call your doctor for medical advice about side effects. You may report side effects to FDA at 1-800-FDA-1088. Where should I keep my medicine? This drug is given in a hospital or clinic and will not be stored at home. NOTE: This sheet is a summary. It may not cover all possible information. If you have questions about this medicine, talk to your doctor, pharmacist, or health care provider.  2012, [employer]vier/[employer] Standard. (02/11/2008 4:50:29 PM)Fluorouracil, 5-FU injection What is this medicine?       FLUOROURACIL, 5-FU (flure oh YOOR a sil) is a chemotherapy drug. It slows the growth of cancer cells. This medicine is used to treat many types of cancer like breast cancer, colon or rectal cancer, pancreatic cancer, and stomach cancer. This medicine  may be used for other purposes; ask your health care provider or pharmacist if you have questions. What should I tell my health care provider before I take this medicine? They need to know if you have any of these conditions: -blood disorders -dihydropyrimidine dehydrogenase (DPD) deficiency -infection (especially a virus infection such as chickenpox, cold sores, or herpes) -kidney disease -liver disease -malnourished, poor nutrition -recent or ongoing radiation therapy -an unusual or allergic reaction to fluorouracil, other chemotherapy, other medicines, foods, dyes, or preservatives -pregnant or trying to get pregnant -breast-feeding How should I use this medicine? This drug is given as an infusion or injection into a vein. It is administered in a hospital or clinic by a specially trained health care professional. Talk to your pediatrician regarding the use of this medicine in children. Special care may be needed. Overdosage: If you think you have taken too much of this medicine contact a poison control center or emergency room at once. NOTE: This medicine is only for you. Do not share this medicine with others. What if I miss a dose? It is important not to miss your dose.  Call your doctor or health care professional if you are unable to keep an appointment. What may interact with this medicine? -allopurinol -cimetidine -dapsone -digoxin -hydroxyurea -leucovorin -levamisole -medicines for seizures like ethotoin, fosphenytoin, phenytoin -medicines to increase blood counts like filgrastim, pegfilgrastim, sargramostim -medicines that treat or prevent blood clots like warfarin, enoxaparin, and dalteparin -methotrexate -metronidazole -pyrimethamine -some other chemotherapy drugs like busulfan, cisplatin, estramustine, vinblastine -trimethoprim -trimetrexate -vaccines Talk to your doctor or health care professional before taking any of these  medicines: -acetaminophen -aspirin -ibuprofen -ketoprofen -naproxen This list may not describe all possible interactions. Give your health care provider a list of all the medicines, herbs, non-prescription drugs, or dietary supplements you use. Also tell them if you smoke, drink alcohol, or use illegal drugs. Some items may interact with your medicine. What should I watch for while using this medicine? Visit your doctor for checks on your progress. This drug may make you feel generally unwell. This is not uncommon, as chemotherapy can affect healthy cells as well as cancer cells. Report any side effects. Continue your course of treatment even though you feel ill unless your doctor tells you to stop. In some cases, you may be given additional medicines to help with side effects. Follow all directions for their use. Call your doctor or health care professional for advice if you get a fever, chills or sore throat, or other symptoms of a cold or flu. Do not treat yourself. This drug decreases your body's ability to fight infections. Try to avoid being around people who are sick. This medicine may increase your risk to bruise or bleed. Call your doctor or health care professional if you notice any unusual bleeding. Be careful brushing and flossing your teeth or using a toothpick because you may get an infection or bleed more easily. If you have any dental work done, tell your dentist you are receiving this medicine. Avoid taking products that contain aspirin, acetaminophen, ibuprofen, naproxen, or ketoprofen unless instructed by your doctor. These medicines may hide a fever. Do not become pregnant while taking this medicine. Women should inform their doctor if they wish to become pregnant or think they might be pregnant. There is a potential for serious side effects to an unborn child. Talk to your health care professional or pharmacist for more information. Do not breast-feed an infant while taking this  medicine. Men should inform their doctor if they wish to father a child. This medicine may lower sperm counts. Do not treat diarrhea with over the counter products. Contact your doctor if you have diarrhea that lasts more than 2 days or if it is severe and watery. This medicine can make you more sensitive to the sun. Keep out of the sun. If you cannot avoid being in the sun, wear protective clothing and use sunscreen. Do not use sun lamps or tanning beds/booths. What side effects may I notice from receiving this medicine? Side effects that you should report to your doctor or health care professional as soon as possible: -allergic reactions like skin rash, itching or hives, swelling of the face, lips, or tongue -low blood counts - this medicine may decrease the number of white blood cells, red blood cells and platelets. You may be at increased risk for infections and bleeding. -signs of infection - fever or chills, cough, sore throat, pain or difficulty passing urine -signs of decreased platelets or bleeding - bruising, pinpoint red spots on the skin, black, tarry stools, blood in the urine -signs of decreased red  blood cells - unusually weak or tired, fainting spells, lightheadedness -breathing problems -changes in vision -chest pain -mouth sores -nausea and vomiting -pain, swelling, redness at site where injected -pain, tingling, numbness in the hands or feet -redness, swelling, or sores on hands or feet -stomach pain -unusual bleeding Side effects that usually do not require medical attention (report to your doctor or health care professional if they continue or are bothersome): -changes in finger or toe nails -diarrhea -dry or itchy skin -hair loss -headache -loss of appetite -sensitivity of eyes to the light -stomach upset -unusually teary eyes This list may not describe all possible side effects. Call your doctor for medical advice about side effects. You may report side effects  to FDA at 1-800-FDA-1088. Where should I keep my medicine? This drug is given in a hospital or clinic and will not be stored at home. NOTE: This sheet is a summary. It may not cover all possible information. If you have questions about this medicine, talk to your doctor, pharmacist, or health care provider.  2012, Elsevier/Gold Standard. (12/11/2007 1:53:16 PM)Oxaliplatin Injection What is this medicine?     OXALIPLATIN (ox AL i PLA tin) is a chemotherapy drug. It targets fast dividing cells, like cancer cells, and causes these cells to die. This medicine is used to treat cancers of the colon and rectum, and many other cancers. This medicine may be used for other purposes; ask your health care provider or pharmacist if you have questions. What should I tell my health care provider before I take this medicine? They need to know if you have any of these conditions: -kidney disease -an unusual or allergic reaction to oxaliplatin, other chemotherapy, other medicines, foods, dyes, or preservatives -pregnant or trying to get pregnant -breast-feeding How should I use this medicine? This drug is given as an infusion into a vein. It is administered in a hospital or clinic by a specially trained health care professional. Talk to your pediatrician regarding the use of this medicine in children. Special care may be needed. Overdosage: If you think you have taken too much of this medicine contact a poison control center or emergency room at once. NOTE: This medicine is only for you. Do not share this medicine with others. What if I miss a dose? It is important not to miss a dose. Call your doctor or health care professional if you are unable to keep an appointment. What may interact with this medicine? -medicines to increase blood counts like filgrastim, pegfilgrastim, sargramostim -probenecid -some antibiotics like amikacin, gentamicin, neomycin, polymyxin B, streptomycin,  tobramycin -zalcitabine Talk to your doctor or health care professional before taking any of these medicines: -acetaminophen -aspirin -ibuprofen -ketoprofen -naproxen This list may not describe all possible interactions. Give your health care provider a list of all the medicines, herbs, non-prescription drugs, or dietary supplements you use. Also tell them if you smoke, drink alcohol, or use illegal drugs. Some items may interact with your medicine. What should I watch for while using this medicine? Your condition will be monitored carefully while you are receiving this medicine. You will need important blood work done while you are taking this medicine. This medicine can make you more sensitive to cold. Do not drink cold drinks or use ice. Cover exposed skin before coming in contact with cold temperatures or cold objects. When out in cold weather wear warm clothing and cover your mouth and nose to warm the air that goes into your lungs. Tell your doctor if you  get sensitive to the cold. This drug may make you feel generally unwell. This is not uncommon, as chemotherapy can affect healthy cells as well as cancer cells. Report any side effects. Continue your course of treatment even though you feel ill unless your doctor tells you to stop. In some cases, you may be given additional medicines to help with side effects. Follow all directions for their use. Call your doctor or health care professional for advice if you get a fever, chills or sore throat, or other symptoms of a cold or flu. Do not treat yourself. This drug decreases your body's ability to fight infections. Try to avoid being around people who are sick. This medicine may increase your risk to bruise or bleed. Call your doctor or health care professional if you notice any unusual bleeding. Be careful brushing and flossing your teeth or using a toothpick because you may get an infection or bleed more easily. If you have any dental work done,  tell your dentist you are receiving this medicine. Avoid taking products that contain aspirin, acetaminophen, ibuprofen, naproxen, or ketoprofen unless instructed by your doctor. These medicines may hide a fever. Do not become pregnant while taking this medicine. Women should inform their doctor if they wish to become pregnant or think they might be pregnant. There is a potential for serious side effects to an unborn child. Talk to your health care professional or pharmacist for more information. Do not breast-feed an infant while taking this medicine. Call your doctor or health care professional if you get diarrhea. Do not treat yourself. What side effects may I notice from receiving this medicine? Side effects that you should report to your doctor or health care professional as soon as possible: -allergic reactions like skin rash, itching or hives, swelling of the face, lips, or tongue -low blood counts - This drug may decrease the number of white blood cells, red blood cells and platelets. You may be at increased risk for infections and bleeding. -signs of infection - fever or chills, cough, sore throat, pain or difficulty passing urine -signs of decreased platelets or bleeding - bruising, pinpoint red spots on the skin, black, tarry stools, nosebleeds -signs of decreased red blood cells - unusually weak or tired, fainting spells, lightheadedness -breathing problems -chest pain, pressure -cough -diarrhea -jaw tightness -mouth sores -nausea and vomiting -pain, swelling, redness or irritation at the injection site -pain, tingling, numbness in the hands or feet -problems with balance, talking, walking -redness, blistering, peeling or loosening of the skin, including inside the mouth -trouble passing urine or change in the amount of urine Side effects that usually do not require medical attention (report to your doctor or health care professional if they continue or are bothersome): -changes  in vision -constipation -hair loss -loss of appetite -metallic taste in the mouth or changes in taste -stomach pain This list may not describe all possible side effects. Call your doctor for medical advice about side effects. You may report side effects to FDA at 1-800-FDA-1088. Where should I keep my medicine? This drug is given in a hospital or clinic and will not be stored at home. NOTE: This sheet is a summary. It may not cover all possible information. If you have questions about this medicine, talk to your doctor, pharmacist, or health care provider.  2012, Elsevier/Gold Standard. (03/03/2008 5:22:47 PM)     Lidocaine; Prilocaine cream    What is this medicine? EMLA Cream LIDOCAINE; PRILOCAINE (LYE doe kane; PRIL oh kane)  is a topical anesthetic that causes loss of feeling in the skin and surrounding tissues. It is used to numb the skin before procedures or injections.We use it frequently to numb an implanted port before accessing one with a needle.   This medicine may be used for other purposes; ask your health care provider or pharmacist if you have questions. What should I tell my health care provider before I take this medicine? They need to know if you have any of these conditions: -glucose-6-phosphate deficiencies -heart disease -kidney or liver disease -methemoglobinemia -an unusual or allergic reaction to lidocaine, prilocaine, other medicines, foods, dyes, or preservatives -pregnant or trying to get pregnant -breast-feeding How should I use this medicine? For use with portacath squeeze EMLA cream in a thick layer to the site where the needle will be placed. For example, squeeze the cream onto the bump of the implanted port.  The EMLA cream should be applied to a circular area with a diameter of about a dime.  This is equal to about 2 g of cream of almost half of a 5 g tube.  Do not rub the cream in.  Cover the EMLA cream with saran wrap or press and seal or if you have  tegaderm dressings at home that is fine too.  No need to apply tape to area.  Leave in place for at least 60 minutes.  EMLA cream can be left on up to 4 hours and still be effect.  Your nurse will take the cream off just before they access your implanted port   This medicine is for external use only on the skin. Do not take by mouth. Follow the directions on the prescription label. Wash hands before and after use. Do not use more or leave in contact with the skin longer than directed. Do not apply to eyes or open wounds. It can cause irritation and blurred or temporary loss of vision. If this medicine comes in contact with your eyes, immediately rinse the eye with water. Do not touch or rub the eye. Contact your health care provider right away. Talk to your pediatrician regarding the use of this medicine in children. While this medicine may be prescribed for children for selected conditions, precautions do apply. Overdosage: If you think you have taken too much of this medicine contact a poison control center or emergency room at once. NOTE: This medicine is only for you. Do not share this medicine with others. What if I miss a dose? This medicine is usually only applied once prior to each procedure. It must be in contact with the skin for a period of time for it to work. If you applied this medicine later than directed, tell your health care professional before starting the procedure. What may interact with this medicine? -acetaminophen -chloroquine -dapsone -medicines to control heart rhythm -nitrates like nitroglycerin and nitroprusside -other ointments, creams, or sprays that may contain anesthetic medicine -phenobarbital -phenytoin -quinine -sulfonamides like sulfacetamide, sulfamethoxazole, sulfasalazine and others This list may not describe all possible interactions. Give your health care provider a list of all the medicines, herbs, non-prescription drugs, or dietary supplements you use.  Also tell them if you smoke, drink alcohol, or use illegal drugs. Some items may interact with your medicine. What should I watch for while using this medicine? Be careful to avoid injury to the treated area while it is numb and you are not aware of pain. Avoid scratching, rubbing, or exposing the treated area to hot or cold  temperatures until complete sensation has returned. The numb feeling will wear off a few hours after applying the cream. What side effects may I notice from receiving this medicine? Side effects that you should report to your doctor or health care professional as soon as possible: -blurred vision -chest pain -difficulty breathing -dizziness -drowsiness -fast or irregular heartbeat -skin rash or itching -swelling of your throat, lips, or face -trembling Side effects that usually do not require medical attention (report to your doctor or health care professional if they continue or are bothersome): -changes in ability to feel hot or cold -redness and swelling at the application site This list may not describe all possible side effects. Call your doctor for medical advice about side effects. You may report side effects to FDA at 1-800-FDA-1088. Where should I keep my medicine? Keep out of reach of children. Store at room temperature between 15 and 30 degrees C (59 and 86 degrees F). Keep container tightly closed. Throw away any unused medicine after the expiration date. NOTE: This sheet is a summary. It may not cover all possible information. If you have questions about this medicine, talk to your doctor, pharmacist, or health care provider.  2013, [employer]/Gold Standard. (02/10/2008 5:14:35 PM)     Implanted Port Instructions An implanted port is a central line that has a round shape and is placed under the skin. It is used for long-term IV (intravenous) access for:  Medicine.  Fluids.  Liquid nutrition, such as TPN (total parenteral nutrition).  Blood  samples. Ports can be placed:  In the chest area just below the collarbone (this is the most common place.)  In the arms.  In the belly (abdomen) area.  In the legs. PARTS OF THE PORT A port has 2 main parts:  The reservoir. The reservoir is round, disc-shaped, and will be a small, raised area under your skin.  The reservoir is the part where a needle is inserted (accessed) to either give medicines or to draw blood.  The catheter. The catheter is a long, slender tube that extends from the reservoir. The catheter is placed into a large vein.  Medicine that is inserted into the reservoir goes into the catheter and then into the vein. INSERTION OF THE PORT  The port is surgically placed in either an operating room or in a procedural area (interventional radiology).  Medicine may be given to help you relax during the procedure.  The skin where the port will be inserted is numbed (local anesthetic).  1 or 2 small cuts (incisions) will be made in the skin to insert the port.  The port can be used after it has been inserted. INCISION SITE CARE  The incision site may have small adhesive strips on it. This helps keep the incision site closed. Sometimes, no adhesive strips are placed. Instead of adhesive strips, a special kind of surgical glue is used to keep the incision closed.  If adhesive strips were placed on the incision sites, do not take them off. They will fall off on their own.  The incision site may be sore for 1 to 2 days. Pain medicine can help.  Do not get the incision site wet. Bathe or shower as directed by your caregiver.  The incision site should heal in 5 to 7 days. A small scar may form after the incision has healed. ACCESSING THE PORT Special steps must be taken to access the port:  Before the port is accessed, a numbing cream can be  placed on the skin. This helps numb the skin over the port site.  A sterile technique is used to access the port.  The port  is accessed with a needle. Only "non-coring" port needles should be used to access the port. Once the port is accessed, a blood return should be checked. This helps ensure the port is in the vein and is not clogged (clotted).  If your caregiver believes your port should remain accessed, a clear (transparent) bandage will be placed over the needle site. The bandage and needle will need to be changed every week or as directed by your caregiver.  Keep the bandage covering the needle clean and dry. Do not get it wet. Follow your caregiver's instructions on how to take a shower or bath when the port is accessed.  If your port does not need to stay accessed, no bandage is needed over the port. FLUSHING THE PORT Flushing the port keeps it from getting clogged. How often the port is flushed depends on:  If a constant infusion is running. If a constant infusion is running, the port may not need to be flushed.  If intermittent medicines are given.  If the port is not being used. For intermittent medicines:  The port will need to be flushed:  After medicines have been given.  After blood has been drawn.  As part of routine maintenance.  A port is normally flushed with:  Normal saline.  Heparin.  Follow your caregiver's advice on how often, how much, and the type of flush to use on your port. IMPORTANT PORT INFORMATION  Tell your caregiver if you are allergic to heparin.  After your port is placed, you will get a manufacturer's information card. The card has information about your port. Keep this card with you at all times.  There are many types of ports available. Know what kind of port you have.  In case of an emergency, it may be helpful to wear a medical alert bracelet. This can help alert health care workers that you have a port.  The port can stay in for as long as your caregiver believes it is necessary.  When it is time for the port to come out, surgery will be done to remove  it. The surgery will be similar to how the port was put in.  If you are in the hospital or clinic:  Your port will be taken care of and flushed by a nurse.  If you are at home:  A home health care nurse may give medicines and take care of the port.  You or a family member can get special training and directions for giving medicine and taking care of the port at home. SEEK IMMEDIATE MEDICAL CARE IF:   Your port does not flush or you are unable to get a blood return.  New drainage or pus is coming from the incision.  A bad smell is coming from the incision site.  You develop swelling or increased redness at the incision site.  You develop increased swelling or pain at the port site.  You develop swelling or pain in the surrounding skin near the port.  You have an oral temperature above 102 F (38.9 C), not controlled by medicine. MAKE SURE YOU:   Understand these instructions.  Will watch your condition.  Will get help right away if you are not doing well or get worse. Document Released: 08/07/2005 Document Revised: 10/30/2011 Document Reviewed: 10/29/2008 [facility] Patient Information 2013 [place]

## 2012-07-10 NOTE — Progress Notes (Signed)
Patient very anxious when he arrived here for chemo.  In extreme pain because of newly placed portacath.  Spoke with Dr Myna Hidalgo.  Gave Ativan sublingually and Morphine SQ.  Put EMLA cream on portacath while patient relaxed with family.

## 2012-07-11 ENCOUNTER — Ambulatory Visit (INDEPENDENT_AMBULATORY_CARE_PROVIDER_SITE_OTHER): Payer: Medicaid Other | Admitting: General Surgery

## 2012-07-11 ENCOUNTER — Encounter: Payer: Self-pay | Admitting: Hematology & Oncology

## 2012-07-11 ENCOUNTER — Encounter (INDEPENDENT_AMBULATORY_CARE_PROVIDER_SITE_OTHER): Payer: Self-pay | Admitting: General Surgery

## 2012-07-11 VITALS — BP 104/70 | HR 94 | Temp 97.8°F | Resp 20 | Ht 67.0 in | Wt 165.6 lb

## 2012-07-11 DIAGNOSIS — Z9889 Other specified postprocedural states: Secondary | ICD-10-CM

## 2012-07-11 DIAGNOSIS — Z9049 Acquired absence of other specified parts of digestive tract: Secondary | ICD-10-CM

## 2012-07-11 NOTE — Progress Notes (Signed)
Patient ID: David Conrad, male   DOB: 1970/08/30, 41 y.o.   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 Patient is do well postoperatively and follow up with Dr. Myna Hidalgo for chemotherapy. Patient has subsequently undergone Port-A-Cath placement and started undergoing chemotherapy.  For surgical standpoint patient has been doing well and is tolerating a diet. He does state there is some constipation at times and would like a prescription for stool softener.  On exam: His midline wound is clean dry and intact and healed. His abdomen is soft nontender nondistended with active bowel sounds.   Patient follow up x1 month. I prescribed Colace as stool softener with constipation.

## 2012-07-12 ENCOUNTER — Ambulatory Visit: Payer: Medicaid Other

## 2012-07-12 ENCOUNTER — Ambulatory Visit (HOSPITAL_BASED_OUTPATIENT_CLINIC_OR_DEPARTMENT_OTHER): Payer: Medicaid Other

## 2012-07-12 VITALS — BP 110/75 | HR 84 | Temp 97.5°F | Resp 20

## 2012-07-12 DIAGNOSIS — C182 Malignant neoplasm of ascending colon: Secondary | ICD-10-CM

## 2012-07-12 DIAGNOSIS — Z452 Encounter for adjustment and management of vascular access device: Secondary | ICD-10-CM

## 2012-07-12 MED ORDER — SODIUM CHLORIDE 0.9 % IJ SOLN
10.0000 mL | INTRAMUSCULAR | Status: DC | PRN
  Administered 2012-07-12: 10 mL
  Filled 2012-07-12: qty 10

## 2012-07-12 MED ORDER — HEPARIN SOD (PORK) LOCK FLUSH 100 UNIT/ML IV SOLN
500.0000 [IU] | Freq: Once | INTRAVENOUS | Status: AC | PRN
  Administered 2012-07-12: 500 [IU]
  Filled 2012-07-12: qty 5

## 2012-07-23 ENCOUNTER — Other Ambulatory Visit (HOSPITAL_BASED_OUTPATIENT_CLINIC_OR_DEPARTMENT_OTHER): Payer: Medicaid Other | Admitting: Lab

## 2012-07-23 ENCOUNTER — Ambulatory Visit (HOSPITAL_BASED_OUTPATIENT_CLINIC_OR_DEPARTMENT_OTHER): Payer: Medicaid Other

## 2012-07-23 ENCOUNTER — Ambulatory Visit: Payer: Medicaid Other

## 2012-07-23 ENCOUNTER — Ambulatory Visit (HOSPITAL_BASED_OUTPATIENT_CLINIC_OR_DEPARTMENT_OTHER): Payer: Medicaid Other | Admitting: Hematology & Oncology

## 2012-07-23 VITALS — BP 105/67 | HR 74 | Temp 98.0°F | Resp 18 | Ht 67.0 in | Wt 168.0 lb

## 2012-07-23 DIAGNOSIS — M25529 Pain in unspecified elbow: Secondary | ICD-10-CM

## 2012-07-23 DIAGNOSIS — D509 Iron deficiency anemia, unspecified: Secondary | ICD-10-CM

## 2012-07-23 DIAGNOSIS — Z720 Tobacco use: Secondary | ICD-10-CM

## 2012-07-23 DIAGNOSIS — C182 Malignant neoplasm of ascending colon: Secondary | ICD-10-CM

## 2012-07-23 DIAGNOSIS — D649 Anemia, unspecified: Secondary | ICD-10-CM

## 2012-07-23 DIAGNOSIS — Z5111 Encounter for antineoplastic chemotherapy: Secondary | ICD-10-CM

## 2012-07-23 DIAGNOSIS — C779 Secondary and unspecified malignant neoplasm of lymph node, unspecified: Secondary | ICD-10-CM

## 2012-07-23 LAB — CBC WITH DIFFERENTIAL (CANCER CENTER ONLY)
BASO#: 0 10*3/uL (ref 0.0–0.2)
Eosinophils Absolute: 0.2 10*3/uL (ref 0.0–0.5)
HGB: 9.9 g/dL — ABNORMAL LOW (ref 13.0–17.1)
MCH: 19.8 pg — ABNORMAL LOW (ref 28.0–33.4)
MONO#: 0.8 10*3/uL (ref 0.1–0.9)
MONO%: 19.9 % — ABNORMAL HIGH (ref 0.0–13.0)
NEUT#: 1.6 10*3/uL (ref 1.5–6.5)
Platelets: 201 10*3/uL (ref 145–400)
RBC: 4.99 10*6/uL (ref 4.20–5.70)
WBC: 3.9 10*3/uL — ABNORMAL LOW (ref 4.0–10.0)

## 2012-07-23 LAB — COMPREHENSIVE METABOLIC PANEL
Albumin: 3.7 g/dL (ref 3.5–5.2)
Alkaline Phosphatase: 78 U/L (ref 39–117)
BUN: 11 mg/dL (ref 6–23)
CO2: 25 mEq/L (ref 19–32)
Calcium: 9.4 mg/dL (ref 8.4–10.5)
Chloride: 106 mEq/L (ref 96–112)
Glucose, Bld: 93 mg/dL (ref 70–99)
Potassium: 4.1 mEq/L (ref 3.5–5.3)
Sodium: 141 mEq/L (ref 135–145)
Total Protein: 6.6 g/dL (ref 6.0–8.3)

## 2012-07-23 MED ORDER — OXALIPLATIN CHEMO INJECTION 100 MG/20ML
85.0000 mg/m2 | Freq: Once | INTRAVENOUS | Status: AC
  Administered 2012-07-23: 160 mg via INTRAVENOUS
  Filled 2012-07-23: qty 32

## 2012-07-23 MED ORDER — NICOTINE 14 MG/24HR TD PT24: 1.0000 | MEDICATED_PATCH | Freq: Every day | TRANSDERMAL | Status: DC

## 2012-07-23 MED ORDER — FLUOROURACIL CHEMO INJECTION 2.5 GM/50ML
400.0000 mg/m2 | Freq: Once | INTRAVENOUS | Status: AC
  Administered 2012-07-23: 750 mg via INTRAVENOUS
  Filled 2012-07-23: qty 15

## 2012-07-23 MED ORDER — HEPARIN SOD (PORK) LOCK FLUSH 100 UNIT/ML IV SOLN
500.0000 [IU] | Freq: Once | INTRAVENOUS | Status: DC | PRN
  Filled 2012-07-23: qty 5

## 2012-07-23 MED ORDER — DEXTROSE 5 % IV SOLN
Freq: Once | INTRAVENOUS | Status: AC
  Administered 2012-07-23: 10:00:00 via INTRAVENOUS

## 2012-07-23 MED ORDER — SODIUM CHLORIDE 0.9 % IJ SOLN
10.0000 mL | INTRAMUSCULAR | Status: DC | PRN
  Filled 2012-07-23: qty 10

## 2012-07-23 MED ORDER — SODIUM CHLORIDE 0.9 % IV SOLN
2400.0000 mg/m2 | INTRAVENOUS | Status: DC
  Administered 2012-07-23: 4550 mg via INTRAVENOUS
  Filled 2012-07-23: qty 91

## 2012-07-23 MED ORDER — LEUCOVORIN CALCIUM INJECTION 350 MG
402.0000 mg/m2 | Freq: Once | INTRAVENOUS | Status: AC
  Administered 2012-07-23: 760 mg via INTRAVENOUS
  Filled 2012-07-23: qty 38

## 2012-07-23 MED ORDER — DEXAMETHASONE SODIUM PHOSPHATE 10 MG/ML IJ SOLN
10.0000 mg | Freq: Once | INTRAMUSCULAR | Status: AC
  Administered 2012-07-23: 10 mg via INTRAVENOUS

## 2012-07-23 MED ORDER — PALONOSETRON HCL INJECTION 0.25 MG/5ML
0.2500 mg | Freq: Once | INTRAVENOUS | Status: AC
  Administered 2012-07-23: 0.25 mg via INTRAVENOUS

## 2012-07-23 NOTE — Addendum Note (Signed)
Addended by: Arlan Organ R on: 07/23/2012 01:48 PM   Modules accepted: Orders

## 2012-07-23 NOTE — Progress Notes (Signed)
This office note has been dictated.

## 2012-07-24 ENCOUNTER — Other Ambulatory Visit (HOSPITAL_COMMUNITY): Payer: Medicaid Other

## 2012-07-24 NOTE — Progress Notes (Signed)
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  CURRENT THERAPY:  The patient is status post cycle 1 of FOLFOX.  INTERIM HISTORY:  David Conrad comes in for followup.  He actually tolerated his 1st cycle of chemotherapy pretty well.  His main problem right now is the fact that he has a lot of pain with his left elbow.  He has a  hard time moving his left forearm.  There is some swelling with the elbow.  I suspect that there may be nerve compression.  He apparently had an IV when he was in the hospital in this area, and may have infiltrated.  Perhaps he is going to need to have an MRI to look for compression.  He says that he has numbness and occasional pain in the 4th and 5th digits.  Of note, his KRAS is negative; he does not have a KRAS mutation.  His last CEA was down to 10; a CEA was 71 prior to surgery.  Again, he seems to be doing pretty well with chemotherapy.  He is iron deficient.  We will probably need to give him IV iron at some point.  PHYSICAL EXAMINATION:  This is a well-developed, well-nourished, black gentleman in no obvious distress.  Vital Signs:  Temperature 98, pulse 74, respiratory rate 18, blood pressure 105/67.  Weight is 168.  Head and Neck:  Normocephalic, atraumatic skull.  There are no ocular or oral lesions.  There are no palpable cervical or supraclavicular lymph nodes. Lungs:  Clear bilaterally.  Cardiac:  Regular rate and rhythm with a normal S1, S2.  There are no murmurs, rubs, or bruits.  Abdomen:  Soft. His laparotomy scar is well healed.  There may be some slight tenderness on the right side.  No guarding is noted.  He has no fluid wave.  There is no palpable hepatosplenomegaly.  Back:  No tenderness over the spine, ribs, or hips.  Extremities:  He does show tenderness on the medial aspect of the left elbow.  There is some swelling in this area.  He does have some difficulty with moving the left forearm.  He has  decent strength in his hands.  LABORATORY STUDIES:  White cell count 3.9, hemoglobin 10, hematocrit 32.5, platelet count 201.  MCV is 65.  IMPRESSION:  David Conrad is a 41 year old gentleman with stage IIIC colon cancer.  He had 6 positive lymph nodes.  He is KRAS wild type.  He is microsatellite stable.  This is definitely more of a negative prognostic marker.  We will go ahead with his 2nd cycle of chemotherapy.  We do need to get him set up for an MRI.  We will try to get it done this week.  I told him to put a little ice on the area of the left elbow, and see if this does not help a little bit.  If there is any nerve compression, then we may have to consider surgical intervention to try to relieve the pressure.  We will plan to get him back in 2 weeks.  We will get him back sooner if we need to, depending on what we find with the MRI.    ______________________________ Josph Macho, M.D. PRE/MEDQ  D:  07/23/2012  T:  07/24/2012  Job:  [id number]

## 2012-07-25 ENCOUNTER — Ambulatory Visit (HOSPITAL_COMMUNITY)
Admission: RE | Admit: 2012-07-25 | Discharge: 2012-07-25 | Disposition: A | Discharge status: Home / Self Care | Payer: Medicaid Other | Source: Ambulatory Visit | Attending: Hematology & Oncology | Admitting: Hematology & Oncology

## 2012-07-25 ENCOUNTER — Telehealth: Payer: Self-pay | Admitting: *Deleted

## 2012-07-25 ENCOUNTER — Other Ambulatory Visit: Payer: Self-pay | Admitting: Hematology & Oncology

## 2012-07-25 ENCOUNTER — Ambulatory Visit (HOSPITAL_BASED_OUTPATIENT_CLINIC_OR_DEPARTMENT_OTHER): Payer: Medicaid Other

## 2012-07-25 VITALS — BP 114/79 | HR 68 | Temp 97.5°F | Resp 20

## 2012-07-25 DIAGNOSIS — M25529 Pain in unspecified elbow: Secondary | ICD-10-CM | POA: Insufficient documentation

## 2012-07-25 DIAGNOSIS — C182 Malignant neoplasm of ascending colon: Secondary | ICD-10-CM

## 2012-07-25 MED ORDER — SODIUM CHLORIDE 0.9 % IJ SOLN
10.0000 mL | INTRAMUSCULAR | Status: DC | PRN
  Administered 2012-07-25: 10 mL
  Filled 2012-07-25: qty 10

## 2012-07-25 MED ORDER — HEPARIN SOD (PORK) LOCK FLUSH 100 UNIT/ML IV SOLN
250.0000 [IU] | Freq: Once | INTRAVENOUS | Status: AC | PRN
  Administered 2012-07-25: 500 [IU]
  Filled 2012-07-25: qty 5

## 2012-07-25 NOTE — Telephone Encounter (Signed)
Called patient to let him know that his tumor marker came down from 10 to 4.  Patient ecstatic.

## 2012-07-25 NOTE — Patient Instructions (Addendum)
Fluorouracil, 5-FU injection What is this medicine? FLUOROURACIL, 5-FU (flure oh YOOR a sil) is a chemotherapy drug. It slows the growth of cancer cells. This medicine is used to treat many types of cancer like breast cancer, colon or rectal cancer, pancreatic cancer, and stomach cancer. This medicine may be used for other purposes; ask your health care provider or pharmacist if you have questions. What should I tell my health care provider before I take this medicine? They need to know if you have any of these conditions: -blood disorders -dihydropyrimidine dehydrogenase (DPD) deficiency -infection (especially a virus infection such as chickenpox, cold sores, or herpes) -kidney disease -liver disease -malnourished, poor nutrition -recent or ongoing radiation therapy -an unusual or allergic reaction to fluorouracil, other chemotherapy, other medicines, foods, dyes, or preservatives -pregnant or trying to get pregnant -breast-feeding How should I use this medicine? This drug is given as an infusion or injection into a vein. It is administered in a hospital or clinic by a specially trained health care professional. Talk to your pediatrician regarding the use of this medicine in children. Special care may be needed. Overdosage: If you think you have taken too much of this medicine contact a poison control center or emergency room at once. NOTE: This medicine is only for you. Do not share this medicine with others. What if I miss a dose? It is important not to miss your dose. Call your doctor or health care professional if you are unable to keep an appointment. What may interact with this medicine? -allopurinol -cimetidine -dapsone -digoxin -hydroxyurea -leucovorin -levamisole -medicines for seizures like ethotoin, fosphenytoin, phenytoin -medicines to increase blood counts like filgrastim, pegfilgrastim, sargramostim -medicines that treat or prevent blood clots like warfarin,  enoxaparin, and dalteparin -methotrexate -metronidazole -pyrimethamine -some other chemotherapy drugs like busulfan, cisplatin, estramustine, vinblastine -trimethoprim -trimetrexate -vaccines Talk to your doctor or health care professional before taking any of these medicines: -acetaminophen -aspirin -ibuprofen -ketoprofen -naproxen This list may not describe all possible interactions. Give your health care provider a list of all the medicines, herbs, non-prescription drugs, or dietary supplements you use. Also tell them if you smoke, drink alcohol, or use illegal drugs. Some items may interact with your medicine. What should I watch for while using this medicine? Visit your doctor for checks on your progress. This drug may make you feel generally unwell. This is not uncommon, as chemotherapy can affect healthy cells as well as cancer cells. Report any side effects. Continue your course of treatment even though you feel ill unless your doctor tells you to stop. In some cases, you may be given additional medicines to help with side effects. Follow all directions for their use. Call your doctor or health care professional for advice if you get a fever, chills or sore throat, or other symptoms of a cold or flu. Do not treat yourself. This drug decreases your body's ability to fight infections. Try to avoid being around people who are sick. This medicine may increase your risk to bruise or bleed. Call your doctor or health care professional if you notice any unusual bleeding. Be careful brushing and flossing your teeth or using a toothpick because you may get an infection or bleed more easily. If you have any dental work done, tell your dentist you are receiving this medicine. Avoid taking products that contain aspirin, acetaminophen, ibuprofen, naproxen, or ketoprofen unless instructed by your doctor. These medicines may hide a fever. Do not become pregnant while taking this medicine. Women should    inform their doctor if they wish to become pregnant or think they might be pregnant. There is a potential for serious side effects to an unborn child. Talk to your health care professional or pharmacist for more information. Do not breast-feed an infant while taking this medicine. Men should inform their doctor if they wish to father a child. This medicine may lower sperm counts. Do not treat diarrhea with over the counter products. Contact your doctor if you have diarrhea that lasts more than 2 days or if it is severe and watery. This medicine can make you more sensitive to the sun. Keep out of the sun. If you cannot avoid being in the sun, wear protective clothing and use sunscreen. Do not use sun lamps or tanning beds/booths. What side effects may I notice from receiving this medicine? Side effects that you should report to your doctor or health care professional as soon as possible: -allergic reactions like skin rash, itching or hives, swelling of the face, lips, or tongue -low blood counts - this medicine may decrease the number of white blood cells, red blood cells and platelets. You may be at increased risk for infections and bleeding. -signs of infection - fever or chills, cough, sore throat, pain or difficulty passing urine -signs of decreased platelets or bleeding - bruising, pinpoint red spots on the skin, black, tarry stools, blood in the urine -signs of decreased red blood cells - unusually weak or tired, fainting spells, lightheadedness -breathing problems -changes in vision -chest pain -mouth sores -nausea and vomiting -pain, swelling, redness at site where injected -pain, tingling, numbness in the hands or feet -redness, swelling, or sores on hands or feet -stomach pain -unusual bleeding Side effects that usually do not require medical attention (report to your doctor or health care professional if they continue or are bothersome): -changes in finger or toe  nails -diarrhea -dry or itchy skin -hair loss -headache -loss of appetite -sensitivity of eyes to the light -stomach upset -unusually teary eyes This list may not describe all possible side effects. Call your doctor for medical advice about side effects. You may report side effects to FDA at 1-800-FDA-1088. Where should I keep my medicine? This drug is given in a hospital or clinic and will not be stored at home. NOTE: This sheet is a summary. It may not cover all possible information. If you have questions about this medicine, talk to your doctor, pharmacist, or health care provider.  2012, [employer]. (12/11/2007 1:53:16 PM) 

## 2012-07-25 NOTE — Telephone Encounter (Signed)
Message copied by Anselm Jungling on Thu Jul 25, 2012  9:05 AM ------      Message from: Josph Macho      Created: Tue Jul 23, 2012  8:20 PM       Call -tumor marker coming down to 4 from 10!!!  Cindee Lame

## 2012-07-29 ENCOUNTER — Telehealth: Payer: Self-pay | Admitting: *Deleted

## 2012-07-29 NOTE — Telephone Encounter (Addendum)
Message copied by Wynonia Hazard on Mon Jul 29, 2012  2:19 PM ------      Message from: Josph Macho      Created: Fri Jul 26, 2012  5:03 PM       Call - MRI didn't show any obvious nerve compression.  He needs to see ortho surgery if this persists. I like Dr.Daldorf. Pete  07/29/12 - Spoke to pt. Gave him the above message with verbalized understanding. He stated that the pain doesn't really bother him at this time.

## 2012-08-07 ENCOUNTER — Ambulatory Visit (HOSPITAL_BASED_OUTPATIENT_CLINIC_OR_DEPARTMENT_OTHER): Payer: Medicaid Other | Admitting: Hematology & Oncology

## 2012-08-07 ENCOUNTER — Ambulatory Visit (HOSPITAL_BASED_OUTPATIENT_CLINIC_OR_DEPARTMENT_OTHER): Payer: Medicaid Other

## 2012-08-07 ENCOUNTER — Encounter (INDEPENDENT_AMBULATORY_CARE_PROVIDER_SITE_OTHER): Payer: Self-pay

## 2012-08-07 ENCOUNTER — Other Ambulatory Visit (HOSPITAL_BASED_OUTPATIENT_CLINIC_OR_DEPARTMENT_OTHER): Payer: Medicaid Other | Admitting: Lab

## 2012-08-07 VITALS — BP 121/80 | HR 102 | Temp 98.0°F | Resp 18 | Ht 67.0 in | Wt 172.0 lb

## 2012-08-07 DIAGNOSIS — Z5111 Encounter for antineoplastic chemotherapy: Secondary | ICD-10-CM

## 2012-08-07 DIAGNOSIS — C182 Malignant neoplasm of ascending colon: Secondary | ICD-10-CM

## 2012-08-07 LAB — CBC WITH DIFFERENTIAL (CANCER CENTER ONLY)
BASO#: 0 10*3/uL (ref 0.0–0.2)
EOS%: 1.1 % (ref 0.0–7.0)
Eosinophils Absolute: 0 10*3/uL (ref 0.0–0.5)
HGB: 10.2 g/dL — ABNORMAL LOW (ref 13.0–17.1)
LYMPH%: 37.9 % (ref 14.0–48.0)
MCH: 19.9 pg — ABNORMAL LOW (ref 28.0–33.4)
MCHC: 30.1 g/dL — ABNORMAL LOW (ref 32.0–35.9)
MCV: 66 fL — ABNORMAL LOW (ref 82–98)
MONO%: 20 % — ABNORMAL HIGH (ref 0.0–13.0)
NEUT#: 1.6 10*3/uL (ref 1.5–6.5)
NEUT%: 40.7 % (ref 40.0–80.0)
RBC: 5.12 10*6/uL (ref 4.20–5.70)

## 2012-08-07 LAB — COMPREHENSIVE METABOLIC PANEL
AST: 19 U/L (ref 0–37)
Albumin: 3.9 g/dL (ref 3.5–5.2)
Alkaline Phosphatase: 76 U/L (ref 39–117)
BUN: 13 mg/dL (ref 6–23)
Creatinine, Ser: 0.89 mg/dL (ref 0.50–1.35)
Glucose, Bld: 116 mg/dL — ABNORMAL HIGH (ref 70–99)
Potassium: 4 mEq/L (ref 3.5–5.3)
Total Bilirubin: 0.4 mg/dL (ref 0.3–1.2)

## 2012-08-07 MED ORDER — DEXTROSE 5 % IV SOLN
Freq: Once | INTRAVENOUS | Status: AC
  Administered 2012-08-07: 09:00:00 via INTRAVENOUS

## 2012-08-07 MED ORDER — HEPARIN SOD (PORK) LOCK FLUSH 100 UNIT/ML IV SOLN
500.0000 [IU] | Freq: Once | INTRAVENOUS | Status: DC | PRN
  Filled 2012-08-07: qty 5

## 2012-08-07 MED ORDER — SODIUM CHLORIDE 0.9 % IV SOLN
2400.0000 mg/m2 | INTRAVENOUS | Status: DC
  Administered 2012-08-07: 4550 mg via INTRAVENOUS
  Filled 2012-08-07: qty 91

## 2012-08-07 MED ORDER — PALONOSETRON HCL INJECTION 0.25 MG/5ML
0.2500 mg | Freq: Once | INTRAVENOUS | Status: AC
  Administered 2012-08-07: 0.25 mg via INTRAVENOUS

## 2012-08-07 MED ORDER — OXALIPLATIN CHEMO INJECTION 100 MG/20ML
85.0000 mg/m2 | Freq: Once | INTRAVENOUS | Status: AC
  Administered 2012-08-07: 160 mg via INTRAVENOUS
  Filled 2012-08-07: qty 32

## 2012-08-07 MED ORDER — FLUOROURACIL CHEMO INJECTION 2.5 GM/50ML
400.0000 mg/m2 | Freq: Once | INTRAVENOUS | Status: AC
  Administered 2012-08-07: 750 mg via INTRAVENOUS
  Filled 2012-08-07: qty 15

## 2012-08-07 MED ORDER — SODIUM CHLORIDE 0.9 % IJ SOLN
10.0000 mL | INTRAMUSCULAR | Status: DC | PRN
  Filled 2012-08-07: qty 10

## 2012-08-07 MED ORDER — LEUCOVORIN CALCIUM INJECTION 350 MG
400.0000 mg/m2 | Freq: Once | INTRAVENOUS | Status: AC
  Administered 2012-08-07: 756 mg via INTRAVENOUS
  Filled 2012-08-07: qty 37.8

## 2012-08-07 MED ORDER — DEXAMETHASONE SODIUM PHOSPHATE 10 MG/ML IJ SOLN
10.0000 mg | Freq: Once | INTRAMUSCULAR | Status: AC
  Administered 2012-08-07: 10 mg via INTRAVENOUS

## 2012-08-07 NOTE — Progress Notes (Signed)
This office note has been dictated.

## 2012-08-07 NOTE — Patient Instructions (Addendum)
[facility] Discharge Instructions for Patients Receiving Chemotherapy  Today you received the following chemotherapy agents Oxaliplatin, 5FU To help prevent nausea and vomiting after your treatment, we encourage you to take your nausea medication   1) Zofran  (Ondansetron) 8 mg by mouth one in am and one in pm beginning 3 days after chemotherapy (Saturday 07/13/12).   Take this for 3 days.  This is to help control nausea.  2)Compazine  (Prochlorperazine) Take 10 mg by mouth every 6 hours as needed for nausea or vomiting,  3) Decadron  (Dexamethasone) 4 mg.  Take 2 tablets by mouth 2 times daily with a meal.  Begin taking it the day after chemotherapy for 2 days.    4) Ativan (Lorazepam)- Take .5 mg by mouth or under tongue every 6 hours as needed for nausea, or for anxiety.     If you develop nausea and vomiting that is not controlled by your nausea medication, call the clinic [phone] If it is after clinic hours your family physician or the after hours number for the clinic or go to the Emergency Department.   BELOW ARE SYMPTOMS THAT SHOULD BE REPORTED IMMEDIATELY:  *FEVER GREATER THAN 100.5 F  *CHILLS WITH OR WITHOUT FEVER  NAUSEA AND VOMITING THAT IS NOT CONTROLLED WITH YOUR NAUSEA MEDICATION  *UNUSUAL SHORTNESS OF BREATH  *UNUSUAL BRUISING OR BLEEDING  TENDERNESS IN MOUTH AND THROAT WITH OR WITHOUT PRESENCE OF ULCERS  *URINARY PROBLEMS  *BOWEL PROBLEMS  UNUSUAL RASH Items with * indicate a potential emergency and should be followed up as soon as possible.  One of the nurses will contact you 24 hours after your treatment. Please let the nurse know about any problems that you may have experienced. Feel free to call the clinic you have any questions or concerns. The clinic phone number is [phone]   I have been informed and understand all the instructions given to me. I know to contact the clinic, my physician, or go to the Emergency Department if any  problems should occur. I do not have any questions at this time, but understand that I may call the clinic during office hours   should I have any questions or need assistance in obtaining follow up care.    __________________________________________  _____________  __________ Signature of Patient or Authorized Representative            Date                   Time    __________________________________________ Nurse's Signature         Leucovorin injection What is this medicine? LEUCOVORIN (loo koe VOR in) is used to prevent or treat the harmful effects of some medicines. This medicine is used to treat anemia caused by a low amount of folic acid in the body. It is also used with 5-fluorouracil (5-FU) to treat colon cancer. This medicine may be used for other purposes; ask your health care provider or pharmacist if you have questions. What should I tell my health care provider before I take this medicine? They need to know if you have any of these conditions: -anemia from low levels of vitamin B-12 in the blood -an unusual or allergic reaction to leucovorin, folic acid, other medicines, foods, dyes, or preservatives -pregnant or trying to get pregnant -breast-feeding How should I use this medicine? This medicine is for injection into a muscle or into a vein. It is given by a health care professional in a  hospital or clinic setting. Talk to your pediatrician regarding the use of this medicine in children. Special care may be needed. Overdosage: If you think you have taken too much of this medicine contact a poison control center or emergency room at once. NOTE: This medicine is only for you. Do not share this medicine with others. What if I miss a dose? This does not apply. What may interact with this medicine? -capecitabine -fluorouracil -phenobarbital -phenytoin -primidone -trimethoprim-sulfamethoxazole This list may not describe all possible interactions. Give your health  care provider a list of all the medicines, herbs, non-prescription drugs, or dietary supplements you use. Also tell them if you smoke, drink alcohol, or use illegal drugs. Some items may interact with your medicine. What should I watch for while using this medicine? Your condition will be monitored carefully while you are receiving this medicine. This medicine may increase the side effects of 5-fluorouracil, 5-FU. Tell your doctor or health care professional if you have diarrhea or mouth sores that do not get better or that get worse. What side effects may I notice from receiving this medicine? Side effects that you should report to your doctor or health care professional as soon as possible: -allergic reactions like skin rash, itching or hives, swelling of the face, lips, or tongue -breathing problems -fever, infection -mouth sores -unusual bleeding or bruising -unusually weak or tired Side effects that usually do not require medical attention (report to your doctor or health care professional if they continue or are bothersome): -constipation or diarrhea -loss of appetite -nausea, vomiting This list may not describe all possible side effects. Call your doctor for medical advice about side effects. You may report side effects to FDA at 1-800-FDA-1088. Where should I keep my medicine? This drug is given in a hospital or clinic and will not be stored at home. NOTE: This sheet is a summary. It may not cover all possible information. If you have questions about this medicine, talk to your doctor, pharmacist, or health care provider.  2012, [employer] Standard. (02/11/2008 4:50:29 PM)Fluorouracil, 5-FU injection What is this medicine?       FLUOROURACIL, 5-FU (flure oh YOOR a sil) is a chemotherapy drug. It slows the growth of cancer cells. This medicine is used to treat many types of cancer like breast cancer, colon or rectal cancer, pancreatic cancer, and stomach cancer. This medicine  may be used for other purposes; ask your health care provider or pharmacist if you have questions. What should I tell my health care provider before I take this medicine? They need to know if you have any of these conditions: -blood disorders -dihydropyrimidine dehydrogenase (DPD) deficiency -infection (especially a virus infection such as chickenpox, cold sores, or herpes) -kidney disease -liver disease -malnourished, poor nutrition -recent or ongoing radiation therapy -an unusual or allergic reaction to fluorouracil, other chemotherapy, other medicines, foods, dyes, or preservatives -pregnant or trying to get pregnant -breast-feeding How should I use this medicine? This drug is given as an infusion or injection into a vein. It is administered in a hospital or clinic by a specially trained health care professional. Talk to your pediatrician regarding the use of this medicine in children. Special care may be needed. Overdosage: If you think you have taken too much of this medicine contact a poison control center or emergency room at once. NOTE: This medicine is only for you. Do not share this medicine with others. What if I miss a dose? It is important not to miss your dose.  Call your doctor or health care professional if you are unable to keep an appointment. What may interact with this medicine? -allopurinol -cimetidine -dapsone -digoxin -hydroxyurea -leucovorin -levamisole -medicines for seizures like ethotoin, fosphenytoin, phenytoin -medicines to increase blood counts like filgrastim, pegfilgrastim, sargramostim -medicines that treat or prevent blood clots like warfarin, enoxaparin, and dalteparin -methotrexate -metronidazole -pyrimethamine -some other chemotherapy drugs like busulfan, cisplatin, estramustine, vinblastine -trimethoprim -trimetrexate -vaccines Talk to your doctor or health care professional before taking any of these  medicines: -acetaminophen -aspirin -ibuprofen -ketoprofen -naproxen This list may not describe all possible interactions. Give your health care provider a list of all the medicines, herbs, non-prescription drugs, or dietary supplements you use. Also tell them if you smoke, drink alcohol, or use illegal drugs. Some items may interact with your medicine. What should I watch for while using this medicine? Visit your doctor for checks on your progress. This drug may make you feel generally unwell. This is not uncommon, as chemotherapy can affect healthy cells as well as cancer cells. Report any side effects. Continue your course of treatment even though you feel ill unless your doctor tells you to stop. In some cases, you may be given additional medicines to help with side effects. Follow all directions for their use. Call your doctor or health care professional for advice if you get a fever, chills or sore throat, or other symptoms of a cold or flu. Do not treat yourself. This drug decreases your body's ability to fight infections. Try to avoid being around people who are sick. This medicine may increase your risk to bruise or bleed. Call your doctor or health care professional if you notice any unusual bleeding. Be careful brushing and flossing your teeth or using a toothpick because you may get an infection or bleed more easily. If you have any dental work done, tell your dentist you are receiving this medicine. Avoid taking products that contain aspirin, acetaminophen, ibuprofen, naproxen, or ketoprofen unless instructed by your doctor. These medicines may hide a fever. Do not become pregnant while taking this medicine. Women should inform their doctor if they wish to become pregnant or think they might be pregnant. There is a potential for serious side effects to an unborn child. Talk to your health care professional or pharmacist for more information. Do not breast-feed an infant while taking this  medicine. Men should inform their doctor if they wish to father a child. This medicine may lower sperm counts. Do not treat diarrhea with over the counter products. Contact your doctor if you have diarrhea that lasts more than 2 days or if it is severe and watery. This medicine can make you more sensitive to the sun. Keep out of the sun. If you cannot avoid being in the sun, wear protective clothing and use sunscreen. Do not use sun lamps or tanning beds/booths. What side effects may I notice from receiving this medicine? Side effects that you should report to your doctor or health care professional as soon as possible: -allergic reactions like skin rash, itching or hives, swelling of the face, lips, or tongue -low blood counts - this medicine may decrease the number of white blood cells, red blood cells and platelets. You may be at increased risk for infections and bleeding. -signs of infection - fever or chills, cough, sore throat, pain or difficulty passing urine -signs of decreased platelets or bleeding - bruising, pinpoint red spots on the skin, black, tarry stools, blood in the urine -signs of decreased red  blood cells - unusually weak or tired, fainting spells, lightheadedness -breathing problems -changes in vision -chest pain -mouth sores -nausea and vomiting -pain, swelling, redness at site where injected -pain, tingling, numbness in the hands or feet -redness, swelling, or sores on hands or feet -stomach pain -unusual bleeding Side effects that usually do not require medical attention (report to your doctor or health care professional if they continue or are bothersome): -changes in finger or toe nails -diarrhea -dry or itchy skin -hair loss -headache -loss of appetite -sensitivity of eyes to the light -stomach upset -unusually teary eyes This list may not describe all possible side effects. Call your doctor for medical advice about side effects. You may report side effects  to FDA at 1-800-FDA-1088. Where should I keep my medicine? This drug is given in a hospital or clinic and will not be stored at home. NOTE: This sheet is a summary. It may not cover all possible information. If you have questions about this medicine, talk to your doctor, pharmacist, or health care provider.  2012, Elsevier/Gold Standard. (12/11/2007 1:53:16 PM)Oxaliplatin Injection What is this medicine?     OXALIPLATIN (ox AL i PLA tin) is a chemotherapy drug. It targets fast dividing cells, like cancer cells, and causes these cells to die. This medicine is used to treat cancers of the colon and rectum, and many other cancers. This medicine may be used for other purposes; ask your health care provider or pharmacist if you have questions. What should I tell my health care provider before I take this medicine? They need to know if you have any of these conditions: -kidney disease -an unusual or allergic reaction to oxaliplatin, other chemotherapy, other medicines, foods, dyes, or preservatives -pregnant or trying to get pregnant -breast-feeding How should I use this medicine? This drug is given as an infusion into a vein. It is administered in a hospital or clinic by a specially trained health care professional. Talk to your pediatrician regarding the use of this medicine in children. Special care may be needed. Overdosage: If you think you have taken too much of this medicine contact a poison control center or emergency room at once. NOTE: This medicine is only for you. Do not share this medicine with others. What if I miss a dose? It is important not to miss a dose. Call your doctor or health care professional if you are unable to keep an appointment. What may interact with this medicine? -medicines to increase blood counts like filgrastim, pegfilgrastim, sargramostim -probenecid -some antibiotics like amikacin, gentamicin, neomycin, polymyxin B, streptomycin,  tobramycin -zalcitabine Talk to your doctor or health care professional before taking any of these medicines: -acetaminophen -aspirin -ibuprofen -ketoprofen -naproxen This list may not describe all possible interactions. Give your health care provider a list of all the medicines, herbs, non-prescription drugs, or dietary supplements you use. Also tell them if you smoke, drink alcohol, or use illegal drugs. Some items may interact with your medicine. What should I watch for while using this medicine? Your condition will be monitored carefully while you are receiving this medicine. You will need important blood work done while you are taking this medicine. This medicine can make you more sensitive to cold. Do not drink cold drinks or use ice. Cover exposed skin before coming in contact with cold temperatures or cold objects. When out in cold weather wear warm clothing and cover your mouth and nose to warm the air that goes into your lungs. Tell your doctor if you  get sensitive to the cold. This drug may make you feel generally unwell. This is not uncommon, as chemotherapy can affect healthy cells as well as cancer cells. Report any side effects. Continue your course of treatment even though you feel ill unless your doctor tells you to stop. In some cases, you may be given additional medicines to help with side effects. Follow all directions for their use. Call your doctor or health care professional for advice if you get a fever, chills or sore throat, or other symptoms of a cold or flu. Do not treat yourself. This drug decreases your body's ability to fight infections. Try to avoid being around people who are sick. This medicine may increase your risk to bruise or bleed. Call your doctor or health care professional if you notice any unusual bleeding. Be careful brushing and flossing your teeth or using a toothpick because you may get an infection or bleed more easily. If you have any dental work done,  tell your dentist you are receiving this medicine. Avoid taking products that contain aspirin, acetaminophen, ibuprofen, naproxen, or ketoprofen unless instructed by your doctor. These medicines may hide a fever. Do not become pregnant while taking this medicine. Women should inform their doctor if they wish to become pregnant or think they might be pregnant. There is a potential for serious side effects to an unborn child. Talk to your health care professional or pharmacist for more information. Do not breast-feed an infant while taking this medicine. Call your doctor or health care professional if you get diarrhea. Do not treat yourself. What side effects may I notice from receiving this medicine? Side effects that you should report to your doctor or health care professional as soon as possible: -allergic reactions like skin rash, itching or hives, swelling of the face, lips, or tongue -low blood counts - This drug may decrease the number of white blood cells, red blood cells and platelets. You may be at increased risk for infections and bleeding. -signs of infection - fever or chills, cough, sore throat, pain or difficulty passing urine -signs of decreased platelets or bleeding - bruising, pinpoint red spots on the skin, black, tarry stools, nosebleeds -signs of decreased red blood cells - unusually weak or tired, fainting spells, lightheadedness -breathing problems -chest pain, pressure -cough -diarrhea -jaw tightness -mouth sores -nausea and vomiting -pain, swelling, redness or irritation at the injection site -pain, tingling, numbness in the hands or feet -problems with balance, talking, walking -redness, blistering, peeling or loosening of the skin, including inside the mouth -trouble passing urine or change in the amount of urine Side effects that usually do not require medical attention (report to your doctor or health care professional if they continue or are bothersome): -changes  in vision -constipation -hair loss -loss of appetite -metallic taste in the mouth or changes in taste -stomach pain This list may not describe all possible side effects. Call your doctor for medical advice about side effects. You may report side effects to FDA at 1-800-FDA-1088. Where should I keep my medicine? This drug is given in a hospital or clinic and will not be stored at home. NOTE: This sheet is a summary. It may not cover all possible information. If you have questions about this medicine, talk to your doctor, pharmacist, or health care provider.  2012, Elsevier/Gold Standard. (03/03/2008 5:22:47 PM)     Lidocaine; Prilocaine cream    What is this medicine? EMLA Cream LIDOCAINE; PRILOCAINE (LYE doe kane; PRIL oh kane)  is a topical anesthetic that causes loss of feeling in the skin and surrounding tissues. It is used to numb the skin before procedures or injections.We use it frequently to numb an implanted port before accessing one with a needle.   This medicine may be used for other purposes; ask your health care provider or pharmacist if you have questions. What should I tell my health care provider before I take this medicine? They need to know if you have any of these conditions: -glucose-6-phosphate deficiencies -heart disease -kidney or liver disease -methemoglobinemia -an unusual or allergic reaction to lidocaine, prilocaine, other medicines, foods, dyes, or preservatives -pregnant or trying to get pregnant -breast-feeding How should I use this medicine? For use with portacath squeeze EMLA cream in a thick layer to the site where the needle will be placed. For example, squeeze the cream onto the bump of the implanted port.  The EMLA cream should be applied to a circular area with a diameter of about a dime.  This is equal to about 2 g of cream of almost half of a 5 g tube.  Do not rub the cream in.  Cover the EMLA cream with saran wrap or press and seal or if you have  tegaderm dressings at home that is fine too.  No need to apply tape to area.  Leave in place for at least 60 minutes.  EMLA cream can be left on up to 4 hours and still be effect.  Your nurse will take the cream off just before they access your implanted port   This medicine is for external use only on the skin. Do not take by mouth. Follow the directions on the prescription label. Wash hands before and after use. Do not use more or leave in contact with the skin longer than directed. Do not apply to eyes or open wounds. It can cause irritation and blurred or temporary loss of vision. If this medicine comes in contact with your eyes, immediately rinse the eye with water. Do not touch or rub the eye. Contact your health care provider right away. Talk to your pediatrician regarding the use of this medicine in children. While this medicine may be prescribed for children for selected conditions, precautions do apply. Overdosage: If you think you have taken too much of this medicine contact a poison control center or emergency room at once. NOTE: This medicine is only for you. Do not share this medicine with others. What if I miss a dose? This medicine is usually only applied once prior to each procedure. It must be in contact with the skin for a period of time for it to work. If you applied this medicine later than directed, tell your health care professional before starting the procedure. What may interact with this medicine? -acetaminophen -chloroquine -dapsone -medicines to control heart rhythm -nitrates like nitroglycerin and nitroprusside -other ointments, creams, or sprays that may contain anesthetic medicine -phenobarbital -phenytoin -quinine -sulfonamides like sulfacetamide, sulfamethoxazole, sulfasalazine and others This list may not describe all possible interactions. Give your health care provider a list of all the medicines, herbs, non-prescription drugs, or dietary supplements you use.  Also tell them if you smoke, drink alcohol, or use illegal drugs. Some items may interact with your medicine. What should I watch for while using this medicine? Be careful to avoid injury to the treated area while it is numb and you are not aware of pain. Avoid scratching, rubbing, or exposing the treated area to hot or cold  temperatures until complete sensation has returned. The numb feeling will wear off a few hours after applying the cream. What side effects may I notice from receiving this medicine? Side effects that you should report to your doctor or health care professional as soon as possible: -blurred vision -chest pain -difficulty breathing -dizziness -drowsiness -fast or irregular heartbeat -skin rash or itching -swelling of your throat, lips, or face -trembling Side effects that usually do not require medical attention (report to your doctor or health care professional if they continue or are bothersome): -changes in ability to feel hot or cold -redness and swelling at the application site This list may not describe all possible side effects. Call your doctor for medical advice about side effects. You may report side effects to FDA at 1-800-FDA-1088. Where should I keep my medicine? Keep out of reach of children. Store at room temperature between 15 and 30 degrees C (59 and 86 degrees F). Keep container tightly closed. Throw away any unused medicine after the expiration date. NOTE: This sheet is a summary. It may not cover all possible information. If you have questions about this medicine, talk to your doctor, pharmacist, or health care provider.  2013, [employer]/Gold Standard. (02/10/2008 5:14:35 PM)     Implanted Port Instructions An implanted port is a central line that has a round shape and is placed under the skin. It is used for long-term IV (intravenous) access for:  Medicine.  Fluids.  Liquid nutrition, such as TPN (total parenteral nutrition).  Blood  samples. Ports can be placed:  In the chest area just below the collarbone (this is the most common place.)  In the arms.  In the belly (abdomen) area.  In the legs. PARTS OF THE PORT A port has 2 main parts:  The reservoir. The reservoir is round, disc-shaped, and will be a small, raised area under your skin.  The reservoir is the part where a needle is inserted (accessed) to either give medicines or to draw blood.  The catheter. The catheter is a long, slender tube that extends from the reservoir. The catheter is placed into a large vein.  Medicine that is inserted into the reservoir goes into the catheter and then into the vein. INSERTION OF THE PORT  The port is surgically placed in either an operating room or in a procedural area (interventional radiology).  Medicine may be given to help you relax during the procedure.  The skin where the port will be inserted is numbed (local anesthetic).  1 or 2 small cuts (incisions) will be made in the skin to insert the port.  The port can be used after it has been inserted. INCISION SITE CARE  The incision site may have small adhesive strips on it. This helps keep the incision site closed. Sometimes, no adhesive strips are placed. Instead of adhesive strips, a special kind of surgical glue is used to keep the incision closed.  If adhesive strips were placed on the incision sites, do not take them off. They will fall off on their own.  The incision site may be sore for 1 to 2 days. Pain medicine can help.  Do not get the incision site wet. Bathe or shower as directed by your caregiver.  The incision site should heal in 5 to 7 days. A small scar may form after the incision has healed. ACCESSING THE PORT Special steps must be taken to access the port:  Before the port is accessed, a numbing cream can be  placed on the skin. This helps numb the skin over the port site.  A sterile technique is used to access the port.  The port  is accessed with a needle. Only "non-coring" port needles should be used to access the port. Once the port is accessed, a blood return should be checked. This helps ensure the port is in the vein and is not clogged (clotted).  If your caregiver believes your port should remain accessed, a clear (transparent) bandage will be placed over the needle site. The bandage and needle will need to be changed every week or as directed by your caregiver.  Keep the bandage covering the needle clean and dry. Do not get it wet. Follow your caregiver's instructions on how to take a shower or bath when the port is accessed.  If your port does not need to stay accessed, no bandage is needed over the port. FLUSHING THE PORT Flushing the port keeps it from getting clogged. How often the port is flushed depends on:  If a constant infusion is running. If a constant infusion is running, the port may not need to be flushed.  If intermittent medicines are given.  If the port is not being used. For intermittent medicines:  The port will need to be flushed:  After medicines have been given.  After blood has been drawn.  As part of routine maintenance.  A port is normally flushed with:  Normal saline.  Heparin.  Follow your caregiver's advice on how often, how much, and the type of flush to use on your port. IMPORTANT PORT INFORMATION  Tell your caregiver if you are allergic to heparin.  After your port is placed, you will get a manufacturer's information card. The card has information about your port. Keep this card with you at all times.  There are many types of ports available. Know what kind of port you have.  In case of an emergency, it may be helpful to wear a medical alert bracelet. This can help alert health care workers that you have a port.  The port can stay in for as long as your caregiver believes it is necessary.  When it is time for the port to come out, surgery will be done to remove  it. The surgery will be similar to how the port was put in.  If you are in the hospital or clinic:  Your port will be taken care of and flushed by a nurse.  If you are at home:  A home health care nurse may give medicines and take care of the port.  You or a family member can get special training and directions for giving medicine and taking care of the port at home. SEEK IMMEDIATE MEDICAL CARE IF:   Your port does not flush or you are unable to get a blood return.  New drainage or pus is coming from the incision.  A bad smell is coming from the incision site.  You develop swelling or increased redness at the incision site.  You develop increased swelling or pain at the port site.  You develop swelling or pain in the surrounding skin near the port.  You have an oral temperature above 102 F (38.9 C), not controlled by medicine. MAKE SURE YOU:   Understand these instructions.  Will watch your condition.  Will get help right away if you are not doing well or get worse. Document Released: 08/07/2005 Document Revised: 10/30/2011 Document Reviewed: 10/29/2008 [facility] Patient Information 2013 [place] [employer]   Cancer Center Discharge Instructions for Patients Receiving Chemotherapy  Today you received the following chemotherapy agents Oxaliplatin, 5FU, Leucovorin To help prevent nausea and vomiting after your treatment, we encourage you to take your nausea medication   1) Zofran  (Ondansetron) 8 mg by mouth one in am and one in pm beginning 3 days after chemotherapy.   Take this for 3 days.    2)Compazine  (Prochlorperazine)10 mg by mouth every 6 hours as needed for nausea or vomiting,  3) Decadron  (Dexamethasone 4 mg)  Take 2 tablets by mouth 2 times daily with a meal.  Begin taking it the day after chemotherapy for 3 days.    4) Ativan (Lorazepam)- Take .5 mg tablet by mouth or under tongue every 6 hours for nausea, or for anxiety.     If you develop  nausea and vomiting that is not controlled by your nausea medication, call the clinic [phone] If it is after clinic hours your family physician or the after hours number for the clinic or go to the Emergency Department.   BELOW ARE SYMPTOMS THAT SHOULD BE REPORTED IMMEDIATELY:  *FEVER GREATER THAN 100.5 F  *CHILLS WITH OR WITHOUT FEVER  NAUSEA AND VOMITING THAT IS NOT CONTROLLED WITH YOUR NAUSEA MEDICATION  *UNUSUAL SHORTNESS OF BREATH  *UNUSUAL BRUISING OR BLEEDING  TENDERNESS IN MOUTH AND THROAT WITH OR WITHOUT PRESENCE OF ULCERS  *URINARY PROBLEMS  *BOWEL PROBLEMS  UNUSUAL RASH Items with * indicate a potential emergency and should be followed up as soon as possible.  One of the nurses will contact you 24 hours after your treatment. Please let the nurse know about any problems that you may have experienced. Feel free to call the clinic you have any questions or concerns. The clinic phone number is [phone]   I have been informed and understand all the instructions given to me. I know to contact the clinic, my physician, or go to the Emergency Department if any problems should occur. I do not have any questions at this time, but understand that I may call the clinic during office hours   should I have any questions or need assistance in obtaining follow up care.    __________________________________________  _____________  __________ Signature of Patient or Authorized Representative            Date                   Time    __________________________________________ Nurse's Signature

## 2012-08-08 ENCOUNTER — Ambulatory Visit (INDEPENDENT_AMBULATORY_CARE_PROVIDER_SITE_OTHER): Payer: Medicaid Other | Admitting: General Surgery

## 2012-08-08 ENCOUNTER — Encounter (INDEPENDENT_AMBULATORY_CARE_PROVIDER_SITE_OTHER): Payer: Self-pay | Admitting: General Surgery

## 2012-08-08 VITALS — BP 100/70 | HR 68 | Temp 98.5°F | Resp 14 | Ht 67.0 in | Wt 176.2 lb

## 2012-08-08 DIAGNOSIS — Z9889 Other specified postprocedural states: Secondary | ICD-10-CM

## 2012-08-08 DIAGNOSIS — Z9049 Acquired absence of other specified parts of digestive tract: Secondary | ICD-10-CM

## 2012-08-08 NOTE — Progress Notes (Signed)
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  CURRENT THERAPY:  Patient is status post 2 cycles of FOLFOX.  INTERIM HISTORY:  Mr. Bristol comes in for his follow-up.  Every time I see him, he looks better.  His left forearm is doing better.  The last time I saw him he was having a tough time.  We did an MRI which did not show any obvious problems with the forearm.  Again, he is exercising better.  He is more active now.  His appetite is improved.  His weight is going up.  His last CEA was down to 4.4.  This continues to improve.  He has had no cough.  He has had no problems with diarrhea.  He has had no neuropathy in the hands or feet.  PHYSICAL EXAMINATION:  General:  This is a well-developed, well- nourished black gentleman in no obvious distress.  Vital signs: Temperature of 98, pulse 102, respiratory rate 18, blood pressure 121/80.  Weight is 172.  Head and neck:  Normocephalic, atraumatic skull.  There are no ocular or oral lesions.  There are no palpable cervical or supraclavicular lymph nodes.  Lungs:  Clear bilaterally. Cardiac:  Regular rate and rhythm with normal S1 and S2.  There are no murmurs, rubs, or bruits.  Abdomen:  Soft with good bowel sounds.  There is no palpable abdominal mass.  There is no palpable hepatosplenomegaly. His laparotomy scar is well healed.  Extremities:  No clubbing, cyanosis, or edema.  There is no tenderness to palpation with the left elbow.  Neurological:  No focal neurological deficits.  LABORATORY STUDIES:  White cell count 3.8, hemoglobin 10.2, hematocrit 33.9, platelet count is 220.  IMPRESSION:  Mr. Calcaterra is a 41 year old gentleman with stage IIIC adenocarcinoma of the ascending colon.  He had 6 positive lymph nodes.  He is KRAS negative.  We will go ahead with his third cycle of chemo.  We will try to move him right along.  His next cycle will be due on January 1st.  I think we can probably push him  back 1 week.  I do not see a problem with delaying his treatment 1 week.  I am just so glad that he has done as well as he has.  He has really come a long way since we first saw him.   ______________________________ Josph Macho, M.D. PRE/MEDQ  D:  08/07/2012  T:  08/08/2012  Job:  [id number]

## 2012-08-08 NOTE — Progress Notes (Signed)
Patient ID: David Conrad, male   DOB: 1970-09-02, 41 y.o.   MRN: [id number] Patient is a 41 year old male status post right hemicolectomy secondary to  colon cancer.  Patient has been seeing Dr. Myna Hidalgo and is currently undergoing his third cycle of chemotherapy. Patient has been doing well and gaining weight since his surgery. He continues to have good bowel function. His appetite as well. Currently has no complaints of pain from his incision. His last CEA was down to approximately 4.4.  On exam: Was clean dry and intact abdomen is soft nontender nondistended with active bowel sounds.  Assessment and plan: Patient is a 41 year old male status post right hemicolectomy for colon cancer. He is currently undergoing chemotherapy treatment With Dr. Myna Hidalgo in appreciated services.  I'm glad to see the patient is doing well from a health and nutrition standpoint.  We'll have the patient follow up in 3 months. Visual care followup with Dr. Myna Hidalgo as scheduled.

## 2012-08-09 ENCOUNTER — Telehealth: Payer: Self-pay | Admitting: Hematology & Oncology

## 2012-08-09 ENCOUNTER — Ambulatory Visit (HOSPITAL_BASED_OUTPATIENT_CLINIC_OR_DEPARTMENT_OTHER): Payer: Medicaid Other

## 2012-08-09 VITALS — BP 119/70 | HR 78 | Temp 97.0°F | Resp 18

## 2012-08-09 DIAGNOSIS — C182 Malignant neoplasm of ascending colon: Secondary | ICD-10-CM

## 2012-08-09 DIAGNOSIS — Z452 Encounter for adjustment and management of vascular access device: Secondary | ICD-10-CM

## 2012-08-09 MED ORDER — HEPARIN SOD (PORK) LOCK FLUSH 100 UNIT/ML IV SOLN
500.0000 [IU] | Freq: Once | INTRAVENOUS | Status: AC | PRN
  Administered 2012-08-09: 500 [IU]
  Filled 2012-08-09: qty 5

## 2012-08-09 MED ORDER — SODIUM CHLORIDE 0.9 % IJ SOLN
10.0000 mL | INTRAMUSCULAR | Status: DC | PRN
  Administered 2012-08-09: 10 mL
  Filled 2012-08-09: qty 10

## 2012-08-09 NOTE — Telephone Encounter (Signed)
January and February apt calendar was mailed out to patient's home

## 2012-08-09 NOTE — Patient Instructions (Signed)

## 2012-08-20 ENCOUNTER — Other Ambulatory Visit: Payer: Medicaid Other | Admitting: Lab

## 2012-08-20 ENCOUNTER — Ambulatory Visit: Payer: Medicaid Other

## 2012-08-28 ENCOUNTER — Ambulatory Visit (HOSPITAL_BASED_OUTPATIENT_CLINIC_OR_DEPARTMENT_OTHER): Payer: Medicaid Other | Admitting: Hematology & Oncology

## 2012-08-28 ENCOUNTER — Other Ambulatory Visit (HOSPITAL_BASED_OUTPATIENT_CLINIC_OR_DEPARTMENT_OTHER): Payer: Medicaid Other | Admitting: Lab

## 2012-08-28 ENCOUNTER — Ambulatory Visit (HOSPITAL_BASED_OUTPATIENT_CLINIC_OR_DEPARTMENT_OTHER): Payer: Medicaid Other

## 2012-08-28 ENCOUNTER — Telehealth: Payer: Self-pay | Admitting: Hematology & Oncology

## 2012-08-28 VITALS — BP 109/76 | HR 68 | Temp 97.9°F | Resp 18 | Ht 67.0 in | Wt 177.0 lb

## 2012-08-28 VITALS — BP 142/85 | HR 47 | Temp 97.0°F | Resp 20

## 2012-08-28 DIAGNOSIS — Z5111 Encounter for antineoplastic chemotherapy: Secondary | ICD-10-CM

## 2012-08-28 DIAGNOSIS — R5381 Other malaise: Secondary | ICD-10-CM

## 2012-08-28 DIAGNOSIS — C182 Malignant neoplasm of ascending colon: Secondary | ICD-10-CM

## 2012-08-28 DIAGNOSIS — C779 Secondary and unspecified malignant neoplasm of lymph node, unspecified: Secondary | ICD-10-CM

## 2012-08-28 DIAGNOSIS — R110 Nausea: Secondary | ICD-10-CM

## 2012-08-28 LAB — COMPREHENSIVE METABOLIC PANEL
Albumin: 4.1 g/dL (ref 3.5–5.2)
BUN: 11 mg/dL (ref 6–23)
CO2: 27 mEq/L (ref 19–32)
Glucose, Bld: 94 mg/dL (ref 70–99)
Potassium: 4.1 mEq/L (ref 3.5–5.3)
Sodium: 138 mEq/L (ref 135–145)
Total Bilirubin: 0.3 mg/dL (ref 0.3–1.2)
Total Protein: 6.6 g/dL (ref 6.0–8.3)

## 2012-08-28 LAB — CBC WITH DIFFERENTIAL (CANCER CENTER ONLY)
BASO#: 0 10*3/uL (ref 0.0–0.2)
EOS%: 1.7 % (ref 0.0–7.0)
Eosinophils Absolute: 0.1 10*3/uL (ref 0.0–0.5)
HCT: 36.8 % — ABNORMAL LOW (ref 38.7–49.9)
HGB: 11.3 g/dL — ABNORMAL LOW (ref 13.0–17.1)
MCH: 20.9 pg — ABNORMAL LOW (ref 28.0–33.4)
MCHC: 30.7 g/dL — ABNORMAL LOW (ref 32.0–35.9)
MONO%: 25.8 % — ABNORMAL HIGH (ref 0.0–13.0)
NEUT#: 1.1 10*3/uL — ABNORMAL LOW (ref 1.5–6.5)
NEUT%: 26.6 % — ABNORMAL LOW (ref 40.0–80.0)
RBC: 5.41 10*6/uL (ref 4.20–5.70)

## 2012-08-28 MED ORDER — DEXAMETHASONE SODIUM PHOSPHATE 10 MG/ML IJ SOLN
10.0000 mg | Freq: Once | INTRAMUSCULAR | Status: AC
  Administered 2012-08-28: 10 mg via INTRAVENOUS

## 2012-08-28 MED ORDER — SODIUM CHLORIDE 0.9 % IJ SOLN
10.0000 mL | INTRAMUSCULAR | Status: DC | PRN
  Filled 2012-08-28: qty 10

## 2012-08-28 MED ORDER — PALONOSETRON HCL INJECTION 0.25 MG/5ML
0.2500 mg | Freq: Once | INTRAVENOUS | Status: AC
  Administered 2012-08-28: 0.25 mg via INTRAVENOUS

## 2012-08-28 MED ORDER — DEXTROSE 5 % IV SOLN
Freq: Once | INTRAVENOUS | Status: AC
  Administered 2012-08-28: 11:00:00 via INTRAVENOUS

## 2012-08-28 MED ORDER — LEUCOVORIN CALCIUM INJECTION 350 MG
760.0000 mg | Freq: Once | INTRAVENOUS | Status: AC
  Administered 2012-08-28: 760 mg via INTRAVENOUS
  Filled 2012-08-28: qty 38

## 2012-08-28 MED ORDER — OXALIPLATIN CHEMO INJECTION 100 MG/20ML
85.0000 mg/m2 | Freq: Once | INTRAVENOUS | Status: AC
  Administered 2012-08-28: 160 mg via INTRAVENOUS
  Filled 2012-08-28: qty 32

## 2012-08-28 MED ORDER — HEPARIN SOD (PORK) LOCK FLUSH 100 UNIT/ML IV SOLN
500.0000 [IU] | Freq: Once | INTRAVENOUS | Status: DC | PRN
  Filled 2012-08-28: qty 5

## 2012-08-28 MED ORDER — FLUOROURACIL CHEMO INJECTION 2.5 GM/50ML
400.0000 mg/m2 | Freq: Once | INTRAVENOUS | Status: AC
  Administered 2012-08-28: 750 mg via INTRAVENOUS
  Filled 2012-08-28: qty 15

## 2012-08-28 MED ORDER — SODIUM CHLORIDE 0.9 % IV SOLN
2400.0000 mg/m2 | INTRAVENOUS | Status: DC
  Administered 2012-08-28: 4550 mg via INTRAVENOUS
  Filled 2012-08-28: qty 91

## 2012-08-28 NOTE — Telephone Encounter (Signed)
Pt aware when he comes in to get pump DC on 1-10 to get schedule and contrast for 1-20 CT

## 2012-08-28 NOTE — Patient Instructions (Addendum)
[facility] Discharge Instructions for Patients Receiving Chemotherapy  Today you received the following chemotherapy agents Oxaliplatin, 5FU, Leucovorin To help prevent nausea and vomiting after your treatment, we encourage you to take your nausea medication   1) Zofran  (Ondansetron) 8 mg by mouth one in am and one in pm beginning 3 days after chemotherapy.   Take this for 3 days.    2)Compazine  (Prochlorperazine)10 mg by mouth every 6 hours as needed for nausea or vomiting,  3) Decadron  (Dexamethasone 4 mg)  Take 2 tablets by mouth 2 times daily with a meal.  Begin taking it the day after chemotherapy for 3 days.    4) Ativan (Lorazepam)- Take .5 mg tablet by mouth or under tongue every 6 hours for nausea, or for anxiety.     If you develop nausea and vomiting that is not controlled by your nausea medication, call the clinic [phone] If it is after clinic hours your family physician or the after hours number for the clinic or go to the Emergency Department.   BELOW ARE SYMPTOMS THAT SHOULD BE REPORTED IMMEDIATELY:  *FEVER GREATER THAN 100.5 F  *CHILLS WITH OR WITHOUT FEVER  NAUSEA AND VOMITING THAT IS NOT CONTROLLED WITH YOUR NAUSEA MEDICATION  *UNUSUAL SHORTNESS OF BREATH  *UNUSUAL BRUISING OR BLEEDING  TENDERNESS IN MOUTH AND THROAT WITH OR WITHOUT PRESENCE OF ULCERS  *URINARY PROBLEMS  *BOWEL PROBLEMS  UNUSUAL RASH Items with * indicate a potential emergency and should be followed up as soon as possible.  One of the nurses will contact you 24 hours after your treatment. Please let the nurse know about any problems that you may have experienced. Feel free to call the clinic you have any questions or concerns. The clinic phone number is [phone]   I have been informed and understand all the instructions given to me. I know to contact the clinic, my physician, or go to the Emergency Department if any problems should occur. I do not have any questions  at this time, but understand that I may call the clinic during office hours   should I have any questions or need assistance in obtaining follow up care.    __________________________________________  _____________  __________ Signature of Patient or Authorized Representative            Date                   Time    __________________________________________ Nurse's Signature    

## 2012-08-28 NOTE — Progress Notes (Signed)
This office note has been dictated.

## 2012-08-29 ENCOUNTER — Other Ambulatory Visit: Payer: Self-pay | Admitting: *Deleted

## 2012-08-29 ENCOUNTER — Ambulatory Visit: Payer: Medicaid Other | Admitting: Hematology & Oncology

## 2012-08-29 ENCOUNTER — Ambulatory Visit (HOSPITAL_BASED_OUTPATIENT_CLINIC_OR_DEPARTMENT_OTHER)
Admission: RE | Admit: 2012-08-29 | Discharge: 2012-08-29 | Disposition: A | Discharge status: Home / Self Care | Payer: Medicaid Other | Source: Ambulatory Visit | Attending: Hematology & Oncology | Admitting: Hematology & Oncology

## 2012-08-29 DIAGNOSIS — C182 Malignant neoplasm of ascending colon: Secondary | ICD-10-CM

## 2012-08-29 DIAGNOSIS — C189 Malignant neoplasm of colon, unspecified: Secondary | ICD-10-CM | POA: Insufficient documentation

## 2012-08-29 DIAGNOSIS — R109 Unspecified abdominal pain: Secondary | ICD-10-CM

## 2012-08-29 DIAGNOSIS — K5900 Constipation, unspecified: Secondary | ICD-10-CM | POA: Insufficient documentation

## 2012-08-29 NOTE — Progress Notes (Signed)
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  CURRENT THERAPY:  The patient is status post 3 cycles of FOLFOX.  INTERIM HISTORY:  David Conrad comes in for his followup.  Again, he continues to improve every time we see him.  He is eating better.  He is having very little nausea.  He is fatigued.  He read a lot about chemotherapy.  He was told that he could not have any more kids.  I told him that this was not the case.  I told him that he had to be careful and use birth control if he was going to have intimate relationships.  His CEA continues to come down; it was 3.21 when last checked.  He has had no problems going to the bathroom.  He has had no leg swelling.  He has had no rashes.  He has had no numbness or tingling in the hands or feet.  He has had no headache.  PHYSICAL EXAMINATION:  General:  This is a well-developed, well- nourished black gentleman in no obvious distress.  Vital Signs: Temperature 97.9, pulse 68, respiratory rate 18, blood pressure 109/76. Weight is 177.  Head and Neck:  Normocephalic, atraumatic skull.  There are no ocular or oral lesions.  There are no palpable cervical or supraclavicular lymph nodes.  Lungs:  Clear bilaterally.  Cardiac: Regular rate and rhythm with normal S1 and S2.  There are no murmurs, rubs, or bruits.  Abdomen:  Soft with good bowel sounds.  There is no palpable abdominal mass.  He has a well-healed laparotomy scar.  There is no palpable hepatosplenomegaly.  Extremities:  No clubbing, cyanosis, or edema.  He has good range motion of his joints.  Back:  No tenderness over the spine, ribs, or hips.  LABORATORY STUDIES:  White cell count is 4.2, hemoglobin 11.3, hematocrit 36.8, platelet count 227.  IMPRESSION:  David Conrad is a 42 year old gentleman with locally advanced colon cancer.  He underwent resection.  He had 6 positive lymph nodes.  He is wild type for KRAS.  We will proceed with his 4th cycle  of chemotherapy.  We will go ahead and plan for a restaging CT scan to make sure everything is still looking good.  I believe that things are looking good because the CEA continues to normalize.  We will see him back in 2 more weeks.    ______________________________ Josph Macho, M.D. PRE/MEDQ  D:  08/28/2012  T:  08/29/2012  Job:  [id number]

## 2012-08-30 ENCOUNTER — Ambulatory Visit (HOSPITAL_BASED_OUTPATIENT_CLINIC_OR_DEPARTMENT_OTHER): Payer: Medicaid Other

## 2012-08-30 VITALS — BP 112/74 | HR 80 | Temp 97.3°F | Resp 20

## 2012-08-30 DIAGNOSIS — C182 Malignant neoplasm of ascending colon: Secondary | ICD-10-CM

## 2012-08-30 MED ORDER — SODIUM CHLORIDE 0.9 % IJ SOLN
10.0000 mL | INTRAMUSCULAR | Status: DC | PRN
  Administered 2012-08-30: 10 mL
  Filled 2012-08-30: qty 10

## 2012-08-30 MED ORDER — HEPARIN SOD (PORK) LOCK FLUSH 100 UNIT/ML IV SOLN
500.0000 [IU] | Freq: Once | INTRAVENOUS | Status: AC | PRN
  Administered 2012-08-30: 500 [IU]
  Filled 2012-08-30: qty 5

## 2012-08-30 NOTE — Patient Instructions (Signed)

## 2012-09-06 ENCOUNTER — Telehealth: Payer: Self-pay | Admitting: Hematology & Oncology

## 2012-09-06 ENCOUNTER — Other Ambulatory Visit: Payer: Self-pay | Admitting: Medical

## 2012-09-06 NOTE — Telephone Encounter (Signed)
Patient called stating he did not want to drink contrast for ct scan that is sch for 09/09/12.  i spoke with French Ana (PA) and she confirmed and approved patient to do IV for scan Monday.  i spoke with Misty Stanley [facility] CT tech) and she is aware of the change from contrast to IV.  I called patient back to inform him that he will do IV contrast for scan and should arrive at 7:45 for apt, nothing to eat or drink.  Patient stated ok.

## 2012-09-09 ENCOUNTER — Ambulatory Visit (HOSPITAL_COMMUNITY)
Admission: RE | Admit: 2012-09-09 | Discharge: 2012-09-09 | Disposition: A | Discharge status: Home / Self Care | Payer: Medicaid Other | Source: Ambulatory Visit | Attending: Hematology & Oncology | Admitting: Hematology & Oncology

## 2012-09-09 ENCOUNTER — Ambulatory Visit (HOSPITAL_COMMUNITY): Payer: Medicaid Other

## 2012-09-09 DIAGNOSIS — Z9049 Acquired absence of other specified parts of digestive tract: Secondary | ICD-10-CM | POA: Insufficient documentation

## 2012-09-09 DIAGNOSIS — C189 Malignant neoplasm of colon, unspecified: Secondary | ICD-10-CM | POA: Insufficient documentation

## 2012-09-09 DIAGNOSIS — R911 Solitary pulmonary nodule: Secondary | ICD-10-CM | POA: Insufficient documentation

## 2012-09-09 DIAGNOSIS — E279 Disorder of adrenal gland, unspecified: Secondary | ICD-10-CM | POA: Insufficient documentation

## 2012-09-09 DIAGNOSIS — K7689 Other specified diseases of liver: Secondary | ICD-10-CM | POA: Insufficient documentation

## 2012-09-09 DIAGNOSIS — Z79899 Other long term (current) drug therapy: Secondary | ICD-10-CM | POA: Insufficient documentation

## 2012-09-09 DIAGNOSIS — Z980 Intestinal bypass and anastomosis status: Secondary | ICD-10-CM | POA: Insufficient documentation

## 2012-09-09 DIAGNOSIS — C182 Malignant neoplasm of ascending colon: Secondary | ICD-10-CM

## 2012-09-09 MED ORDER — IOHEXOL 300 MG/ML  SOLN
100.0000 mL | Freq: Once | INTRAMUSCULAR | Status: AC | PRN
  Administered 2012-09-09: 100 mL via INTRAVENOUS

## 2012-09-11 ENCOUNTER — Other Ambulatory Visit (HOSPITAL_BASED_OUTPATIENT_CLINIC_OR_DEPARTMENT_OTHER): Payer: Medicaid Other | Admitting: Lab

## 2012-09-11 ENCOUNTER — Ambulatory Visit (HOSPITAL_BASED_OUTPATIENT_CLINIC_OR_DEPARTMENT_OTHER): Payer: Medicaid Other | Admitting: Hematology & Oncology

## 2012-09-11 ENCOUNTER — Ambulatory Visit (HOSPITAL_BASED_OUTPATIENT_CLINIC_OR_DEPARTMENT_OTHER): Payer: Medicaid Other

## 2012-09-11 VITALS — BP 92/69 | HR 86 | Temp 98.0°F | Resp 18 | Ht 67.0 in | Wt 175.0 lb

## 2012-09-11 DIAGNOSIS — C182 Malignant neoplasm of ascending colon: Secondary | ICD-10-CM

## 2012-09-11 DIAGNOSIS — Z5111 Encounter for antineoplastic chemotherapy: Secondary | ICD-10-CM

## 2012-09-11 LAB — CBC WITH DIFFERENTIAL (CANCER CENTER ONLY)
BASO#: 0 10*3/uL (ref 0.0–0.2)
Eosinophils Absolute: 0.1 10*3/uL (ref 0.0–0.5)
HGB: 11.4 g/dL — ABNORMAL LOW (ref 13.0–17.1)
MONO#: 1 10*3/uL — ABNORMAL HIGH (ref 0.1–0.9)
NEUT#: 1.9 10*3/uL (ref 1.5–6.5)
Platelets: 169 10*3/uL (ref 145–400)
RBC: 5.34 10*6/uL (ref 4.20–5.70)
WBC: 4.6 10*3/uL (ref 4.0–10.0)

## 2012-09-11 LAB — COMPREHENSIVE METABOLIC PANEL
Albumin: 4.1 g/dL (ref 3.5–5.2)
CO2: 28 mEq/L (ref 19–32)
Calcium: 9.6 mg/dL (ref 8.4–10.5)
Chloride: 106 mEq/L (ref 96–112)
Glucose, Bld: 99 mg/dL (ref 70–99)
Potassium: 4.4 mEq/L (ref 3.5–5.3)
Sodium: 141 mEq/L (ref 135–145)
Total Protein: 6.6 g/dL (ref 6.0–8.3)

## 2012-09-11 LAB — CEA: CEA: 1.9 ng/mL (ref 0.0–5.0)

## 2012-09-11 MED ORDER — DEXTROSE 5 % IV SOLN
Freq: Once | INTRAVENOUS | Status: AC
  Administered 2012-09-11: 09:00:00 via INTRAVENOUS

## 2012-09-11 MED ORDER — DEXAMETHASONE SODIUM PHOSPHATE 10 MG/ML IJ SOLN
10.0000 mg | Freq: Once | INTRAMUSCULAR | Status: AC
  Administered 2012-09-11: 10 mg via INTRAVENOUS

## 2012-09-11 MED ORDER — DEXTROSE 5 % IV SOLN
402.0000 mg/m2 | Freq: Once | INTRAVENOUS | Status: AC
  Administered 2012-09-11: 760 mg via INTRAVENOUS
  Filled 2012-09-11: qty 38

## 2012-09-11 MED ORDER — SODIUM CHLORIDE 0.9 % IJ SOLN
10.0000 mL | INTRAMUSCULAR | Status: DC | PRN
  Filled 2012-09-11: qty 10

## 2012-09-11 MED ORDER — PALONOSETRON HCL INJECTION 0.25 MG/5ML
0.2500 mg | Freq: Once | INTRAVENOUS | Status: AC
  Administered 2012-09-11: 0.25 mg via INTRAVENOUS

## 2012-09-11 MED ORDER — OXALIPLATIN CHEMO INJECTION 100 MG/20ML
85.0000 mg/m2 | Freq: Once | INTRAVENOUS | Status: AC
  Administered 2012-09-11: 160 mg via INTRAVENOUS
  Filled 2012-09-11: qty 32

## 2012-09-11 MED ORDER — FLUOROURACIL CHEMO INJECTION 2.5 GM/50ML
400.0000 mg/m2 | Freq: Once | INTRAVENOUS | Status: AC
  Administered 2012-09-11: 750 mg via INTRAVENOUS
  Filled 2012-09-11: qty 15

## 2012-09-11 MED ORDER — HEPARIN SOD (PORK) LOCK FLUSH 100 UNIT/ML IV SOLN
500.0000 [IU] | Freq: Once | INTRAVENOUS | Status: DC | PRN
  Filled 2012-09-11: qty 5

## 2012-09-11 MED ORDER — SODIUM CHLORIDE 0.9 % IV SOLN
2400.0000 mg/m2 | INTRAVENOUS | Status: DC
  Administered 2012-09-11: 4550 mg via INTRAVENOUS
  Filled 2012-09-11: qty 91

## 2012-09-11 NOTE — Progress Notes (Signed)
This office note has been dictated.

## 2012-09-12 NOTE — Progress Notes (Signed)
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  CURRENT THERAPY:  Patient is status post 4 cycles of FOLFOX.  INTERIM HISTORY:  David Conrad comes in for his followup.  He feels okay. He just does not like how the chemotherapy makes him feel for about a week.  Of note, his CEA continues to normalize.  His last CEA was 2.0 back in January.  We did repeat scans on him.  They were done on January 20th.  The CT scans did not show any evidence of recurrent/residual disease.  He is eating okay.  There is no bleeding.  He has had occasional abdominal discomfort at the laparotomy site.  There has been no shortness breath.  He has had no leg swelling.  There have been no rashes.  He has had no headache.  PHYSICAL EXAMINATION:  General:  This is a well-developed, well- nourished black gentleman in no obvious distress.  Vital signs: Temperature of 98, pulse 86, respiratory rate 18, blood pressure 92/69. Weight is 175.  Head and neck:  Normocephalic, atraumatic skull.  There are no ocular or oral lesions.  There are no palpable cervical or supraclavicular lymph nodes.  Lungs:  Clear to percussion and auscultation bilaterally.  Cardiac:  Regular rate and rhythm with a normal S1 and S2.  There are no murmurs, rubs, or bruits.  Abdomen: Soft with good bowel sounds.  There is no palpable abdominal mass. There is no palpable hepatosplenomegaly.  His laparotomy scar is well healed.  Extremities:  No clubbing, cyanosis, or edema.  LABORATORY STUDIES:  White cell count is 4.6, hemoglobin 11.4, hematocrit 37.1, platelet count 169.  IMPRESSION:  David Conrad is a 42 year old African gentleman with stage IIIC adenocarcinoma of the ascending colon.  He has done very well with adjuvant chemotherapy.  He is tolerating it pretty well.  I told him that he does need to complete all 12 cycles.  I just told him that his risk of recurrence is still significant.  We want to make  sure that we treat him aggressively so that we try to minimize his risk of recurrence.  We will go ahead and plan for followup in 2 more weeks.    ______________________________ Josph Macho, M.D. PRE/MEDQ  D:  09/11/2012  T:  09/12/2012  Job:  [id number]

## 2012-09-13 ENCOUNTER — Ambulatory Visit (HOSPITAL_BASED_OUTPATIENT_CLINIC_OR_DEPARTMENT_OTHER): Payer: Medicaid Other

## 2012-09-13 VITALS — BP 108/72 | HR 98 | Temp 97.7°F | Resp 16

## 2012-09-13 DIAGNOSIS — C182 Malignant neoplasm of ascending colon: Secondary | ICD-10-CM

## 2012-09-13 MED ORDER — SODIUM CHLORIDE 0.9 % IJ SOLN
10.0000 mL | INTRAMUSCULAR | Status: DC | PRN
  Filled 2012-09-13: qty 10

## 2012-09-13 MED ORDER — HEPARIN SOD (PORK) LOCK FLUSH 100 UNIT/ML IV SOLN
500.0000 [IU] | Freq: Once | INTRAVENOUS | Status: AC
  Administered 2012-09-13: 500 [IU] via INTRAVENOUS
  Filled 2012-09-13: qty 5

## 2012-09-13 MED ORDER — SODIUM CHLORIDE 0.9 % IJ SOLN
10.0000 mL | INTRAMUSCULAR | Status: DC | PRN
  Administered 2012-09-13: 10 mL via INTRAVENOUS
  Filled 2012-09-13: qty 10

## 2012-09-13 MED ORDER — HEPARIN SOD (PORK) LOCK FLUSH 100 UNIT/ML IV SOLN
500.0000 [IU] | Freq: Once | INTRAVENOUS | Status: DC | PRN
  Filled 2012-09-13: qty 5

## 2012-09-13 NOTE — Progress Notes (Signed)
Pt in today for Pump DC, c/o pain at [facility] insertion site.  Stars crying and states he has not slept all night because of the pain.  PAC dressing intact, no redness at insertion site.  PAC flushed easily with excellent blood return.  After dressing removed there is still no redness or edema site.  Asked pt if he bumped the PAC needle and he stated he might have.  Pt crying with pain with PAC removal.  Dr. Myna Hidalgo made aware.  Pt asking for pain medication and he was given Vicodin po.   This is what he takes at home.  Pt states he is going home and try to sleep.  Instructed to call us if the pain does not subside.  Pt voiced understanding.

## 2012-09-24 ENCOUNTER — Telehealth: Payer: Self-pay | Admitting: Hematology & Oncology

## 2012-09-24 NOTE — Telephone Encounter (Signed)
Patient called and cx 09/25/12 apt due to a family matter.  Patient stated, he did not want to resch, he will keep his 10/09/12 sch apt and come in on that day.

## 2012-09-25 ENCOUNTER — Other Ambulatory Visit: Payer: Medicaid Other | Admitting: Lab

## 2012-09-25 ENCOUNTER — Ambulatory Visit: Payer: Medicaid Other

## 2012-09-25 ENCOUNTER — Ambulatory Visit: Payer: Medicaid Other | Admitting: Medical

## 2012-10-07 ENCOUNTER — Encounter: Payer: Self-pay | Admitting: Pharmacist

## 2012-10-09 ENCOUNTER — Ambulatory Visit (HOSPITAL_BASED_OUTPATIENT_CLINIC_OR_DEPARTMENT_OTHER): Payer: Medicaid Other

## 2012-10-09 ENCOUNTER — Ambulatory Visit (HOSPITAL_BASED_OUTPATIENT_CLINIC_OR_DEPARTMENT_OTHER): Payer: Medicaid Other | Admitting: Hematology & Oncology

## 2012-10-09 ENCOUNTER — Other Ambulatory Visit: Payer: Medicaid Other | Admitting: Lab

## 2012-10-09 VITALS — BP 124/74 | HR 86 | Temp 97.8°F | Resp 18 | Ht 67.0 in | Wt 188.0 lb

## 2012-10-09 DIAGNOSIS — C779 Secondary and unspecified malignant neoplasm of lymph node, unspecified: Secondary | ICD-10-CM

## 2012-10-09 DIAGNOSIS — C182 Malignant neoplasm of ascending colon: Secondary | ICD-10-CM

## 2012-10-09 DIAGNOSIS — Z5111 Encounter for antineoplastic chemotherapy: Secondary | ICD-10-CM

## 2012-10-09 LAB — CBC WITH DIFFERENTIAL (CANCER CENTER ONLY)
BASO%: 0.4 % (ref 0.0–2.0)
HCT: 37.2 % — ABNORMAL LOW (ref 38.7–49.9)
LYMPH#: 1.8 10*3/uL (ref 0.9–3.3)
MONO#: 0.8 10*3/uL (ref 0.1–0.9)
Platelets: 195 10*3/uL (ref 145–400)
RBC: 5.15 10*6/uL (ref 4.20–5.70)
RDW: 25.1 % — ABNORMAL HIGH (ref 11.1–15.7)
WBC: 4.9 10*3/uL (ref 4.0–10.0)

## 2012-10-09 LAB — COMPREHENSIVE METABOLIC PANEL
ALT: 71 U/L — ABNORMAL HIGH (ref 0–53)
AST: 31 U/L (ref 0–37)
Albumin: 3.9 g/dL (ref 3.5–5.2)
Calcium: 9.2 mg/dL (ref 8.4–10.5)
Chloride: 103 mEq/L (ref 96–112)
Potassium: 3.8 mEq/L (ref 3.5–5.3)
Total Protein: 6 g/dL (ref 6.0–8.3)

## 2012-10-09 LAB — CEA: CEA: 1.4 ng/mL (ref 0.0–5.0)

## 2012-10-09 MED ORDER — LEUCOVORIN CALCIUM INJECTION 350 MG
760.0000 mg | Freq: Once | INTRAVENOUS | Status: AC
  Administered 2012-10-09: 760 mg via INTRAVENOUS
  Filled 2012-10-09: qty 38

## 2012-10-09 MED ORDER — SODIUM CHLORIDE 0.9 % IJ SOLN
10.0000 mL | INTRAMUSCULAR | Status: DC | PRN
  Filled 2012-10-09: qty 10

## 2012-10-09 MED ORDER — HEPARIN SOD (PORK) LOCK FLUSH 100 UNIT/ML IV SOLN
500.0000 [IU] | Freq: Once | INTRAVENOUS | Status: DC | PRN
  Filled 2012-10-09: qty 5

## 2012-10-09 MED ORDER — DEXTROSE 5 % IV SOLN
Freq: Once | INTRAVENOUS | Status: AC
  Administered 2012-10-09: 10:00:00 via INTRAVENOUS

## 2012-10-09 MED ORDER — FLUOROURACIL CHEMO INJECTION 2.5 GM/50ML
400.0000 mg/m2 | Freq: Once | INTRAVENOUS | Status: AC
  Administered 2012-10-09: 750 mg via INTRAVENOUS
  Filled 2012-10-09: qty 15

## 2012-10-09 MED ORDER — FLUOROURACIL CHEMO INJECTION 5 GM/100ML
2400.0000 mg/m2 | INTRAVENOUS | Status: DC
  Administered 2012-10-09: 4550 mg via INTRAVENOUS
  Filled 2012-10-09: qty 91

## 2012-10-09 MED ORDER — OXALIPLATIN CHEMO INJECTION 100 MG/20ML
85.0000 mg/m2 | Freq: Once | INTRAVENOUS | Status: AC
  Administered 2012-10-09: 160 mg via INTRAVENOUS
  Filled 2012-10-09: qty 32

## 2012-10-09 MED ORDER — PALONOSETRON HCL INJECTION 0.25 MG/5ML
0.2500 mg | Freq: Once | INTRAVENOUS | Status: AC
  Administered 2012-10-09: 0.25 mg via INTRAVENOUS

## 2012-10-09 MED ORDER — DEXAMETHASONE SODIUM PHOSPHATE 10 MG/ML IJ SOLN
10.0000 mg | Freq: Once | INTRAMUSCULAR | Status: AC
  Administered 2012-10-09: 10 mg via INTRAVENOUS

## 2012-10-09 NOTE — Patient Instructions (Addendum)
[facility] Discharge Instructions for Patients Receiving Chemotherapy  Today you received the following chemotherapy agents Oxaliplatin and  5FU To help prevent nausea and vomiting after your treatment, we encourage you to take your nausea medication  Begin taking it at  and take it as often as prescribed .  If you develop nausea and vomiting that is not controlled by your nausea medication, call the clinic. If it is after clinic hours your family physician or the after hours number for the clinic or go to the Emergency Department.   BELOW ARE SYMPTOMS THAT SHOULD BE REPORTED IMMEDIATELY:  *FEVER GREATER THAN 100.5 F  *CHILLS WITH OR WITHOUT FEVER  NAUSEA AND VOMITING THAT IS NOT CONTROLLED WITH YOUR NAUSEA MEDICATION  *UNUSUAL SHORTNESS OF BREATH  *UNUSUAL BRUISING OR BLEEDING  TENDERNESS IN MOUTH AND THROAT WITH OR WITHOUT PRESENCE OF ULCERS  *URINARY PROBLEMS  *BOWEL PROBLEMS  UNUSUAL RASH Items with * indicate a potential emergency and should be followed up as soon as possible.  One of the nurses will contact you 24 hours after your treatment. Please let the nurse know about any problems that you may have experienced. Feel free to call the clinic you have any questions or concerns. The clinic phone number is [phone].   I have been informed and understand all the instructions given to me. I know to contact the clinic, my physician, or go to the Emergency Department if any problems should occur. I do not have any questions at this time, but understand that I may call the clinic during office hours   should I have any questions or need assistance in obtaining follow up care.    __________________________________________  _____________  __________ Signature of Patient or Authorized Representative            Date                   Time    __________________________________________ Nurse's Signature

## 2012-10-09 NOTE — Progress Notes (Signed)
This office note has been dictated.

## 2012-10-10 NOTE — Progress Notes (Signed)
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  CURRENT THERAPY:  Patient is status post 5 cycles of FOLFOX.  INTERIM HISTORY:  Mr. Humble comes in for his followup.  He is doing okay.  Unfortunately, there are some issues at home.  One of his sons does have some psychological problems.  He apparently attacked his Run[employer], broadcasting/film/video.  He is going to have to go to court now.  Mr. Markovic seems to be hanging in pretty well with all this.  His last chemotherapy was back on January 22nd.  Again, he has done very well with treatment.  He is having no nausea or vomiting.  His last CEA was 1.9.  He has had no pain.  He has had no cough or shortness breath.  PHYSICAL EXAMINATION:  General:  This is a well-developed, well- nourished, black gentleman in no obvious distress.  Vital Signs: Temperature 97.8, pulse 86, respiratory rate 18, blood pressure 124/74, weight 188.  Head and Neck:  Normocephalic, atraumatic skull.  There are no ocular or oral lesions.  There are no palpable cervical or supraclavicular lymph nodes.  Lungs:  Clear bilaterally.  Cardiac: Regular rate and rhythm with normal S1 and S2.  Abdomen:  Soft. Laparotomy scar is well healed.  He has no fluid wave.  There is no palpable hepatosplenomegaly.  Extremities:  No clubbing, cyanosis, or edema.  Neurological:  No focal neurological deficits.  LABORATORY STUDIES:  White cell count 4.2, hemoglobin 11.5, hematocrit 37.3, platelet count 195, MCV 72.  IMPRESSION:  Mr. Kalas is a 42 year old black gentleman with stage IIIC adenocarcinoma of the ascending colon.  He is doing well.  He had 6 positive lymph nodes.  His tumor was microsatellite stable.  He was KRAS wild type.  We are being very aggressive with his adjuvant therapy.  We will go ahead and proceed with his 6th cycle today.  Will him come back to see Tracey.    ______________________________ Josph Macho, M.D. PRE/MEDQ  D:  10/09/2012  T:   10/10/2012  Job:  [id number]

## 2012-10-11 ENCOUNTER — Ambulatory Visit (HOSPITAL_BASED_OUTPATIENT_CLINIC_OR_DEPARTMENT_OTHER): Payer: Medicaid Other

## 2012-10-11 VITALS — BP 123/68 | HR 84 | Temp 97.9°F | Resp 20

## 2012-10-11 DIAGNOSIS — C182 Malignant neoplasm of ascending colon: Secondary | ICD-10-CM

## 2012-10-11 MED ORDER — HEPARIN SOD (PORK) LOCK FLUSH 100 UNIT/ML IV SOLN
500.0000 [IU] | Freq: Once | INTRAVENOUS | Status: AC | PRN
  Administered 2012-10-11: 500 [IU]
  Filled 2012-10-11: qty 5

## 2012-10-11 MED ORDER — SODIUM CHLORIDE 0.9 % IJ SOLN
10.0000 mL | INTRAMUSCULAR | Status: DC | PRN
  Administered 2012-10-11: 10 mL
  Filled 2012-10-11: qty 10

## 2012-10-11 NOTE — Patient Instructions (Signed)

## 2012-10-22 ENCOUNTER — Telehealth: Payer: Self-pay | Admitting: Hematology & Oncology

## 2012-10-22 NOTE — Telephone Encounter (Signed)
Patient called and cx 10/23/12 and 10/25/12 apt due to transportation issues.  He stated he will be here on his next apt 11/06/12

## 2012-10-23 ENCOUNTER — Ambulatory Visit: Payer: Self-pay | Admitting: Medical

## 2012-10-23 ENCOUNTER — Other Ambulatory Visit: Payer: Medicaid Other | Admitting: Lab

## 2012-10-23 ENCOUNTER — Ambulatory Visit: Payer: Medicaid Other

## 2012-11-05 ENCOUNTER — Other Ambulatory Visit: Payer: Self-pay | Admitting: Medical

## 2012-11-05 DIAGNOSIS — C182 Malignant neoplasm of ascending colon: Secondary | ICD-10-CM

## 2012-11-06 ENCOUNTER — Other Ambulatory Visit: Payer: Self-pay | Admitting: Medical

## 2012-11-06 ENCOUNTER — Other Ambulatory Visit (HOSPITAL_BASED_OUTPATIENT_CLINIC_OR_DEPARTMENT_OTHER): Payer: Medicaid Other | Admitting: Lab

## 2012-11-06 ENCOUNTER — Ambulatory Visit (HOSPITAL_BASED_OUTPATIENT_CLINIC_OR_DEPARTMENT_OTHER): Payer: Medicaid Other | Admitting: Medical

## 2012-11-06 ENCOUNTER — Ambulatory Visit (HOSPITAL_BASED_OUTPATIENT_CLINIC_OR_DEPARTMENT_OTHER): Payer: Medicaid Other

## 2012-11-06 ENCOUNTER — Encounter: Payer: Self-pay | Admitting: Medical

## 2012-11-06 VITALS — BP 106/69 | HR 87 | Temp 98.0°F | Resp 18 | Ht 67.0 in | Wt 197.0 lb

## 2012-11-06 DIAGNOSIS — C779 Secondary and unspecified malignant neoplasm of lymph node, unspecified: Secondary | ICD-10-CM

## 2012-11-06 DIAGNOSIS — C182 Malignant neoplasm of ascending colon: Secondary | ICD-10-CM

## 2012-11-06 DIAGNOSIS — Z5111 Encounter for antineoplastic chemotherapy: Secondary | ICD-10-CM

## 2012-11-06 DIAGNOSIS — R110 Nausea: Secondary | ICD-10-CM

## 2012-11-06 LAB — COMPREHENSIVE METABOLIC PANEL
ALT: 37 U/L (ref 0–53)
Albumin: 4.3 g/dL (ref 3.5–5.2)
CO2: 26 mEq/L (ref 19–32)
Calcium: 9.4 mg/dL (ref 8.4–10.5)
Chloride: 106 mEq/L (ref 96–112)
Sodium: 141 mEq/L (ref 135–145)
Total Protein: 6.6 g/dL (ref 6.0–8.3)

## 2012-11-06 LAB — CBC WITH DIFFERENTIAL (CANCER CENTER ONLY)
BASO%: 0.4 % (ref 0.0–2.0)
Eosinophils Absolute: 0.2 10*3/uL (ref 0.0–0.5)
HCT: 40.8 % (ref 38.7–49.9)
LYMPH#: 1.8 10*3/uL (ref 0.9–3.3)
MONO#: 1.2 10*3/uL — ABNORMAL HIGH (ref 0.1–0.9)
NEUT%: 38.5 % — ABNORMAL LOW (ref 40.0–80.0)
Platelets: 236 10*3/uL (ref 145–400)
RBC: 5.49 10*6/uL (ref 4.20–5.70)
RDW: 23.2 % — ABNORMAL HIGH (ref 11.1–15.7)
WBC: 5.1 10*3/uL (ref 4.0–10.0)

## 2012-11-06 MED ORDER — LEUCOVORIN CALCIUM INJECTION 350 MG
400.0000 mg/m2 | Freq: Once | INTRAVENOUS | Status: AC
  Administered 2012-11-06: 756 mg via INTRAVENOUS
  Filled 2012-11-06: qty 37.8

## 2012-11-06 MED ORDER — OXALIPLATIN CHEMO INJECTION 100 MG/20ML
85.0000 mg/m2 | Freq: Once | INTRAVENOUS | Status: AC
  Administered 2012-11-06: 160 mg via INTRAVENOUS
  Filled 2012-11-06: qty 32

## 2012-11-06 MED ORDER — FLUOROURACIL CHEMO INJECTION 5 GM/100ML
2400.0000 mg/m2 | INTRAVENOUS | Status: DC
  Administered 2012-11-06: 4550 mg via INTRAVENOUS
  Filled 2012-11-06: qty 91

## 2012-11-06 MED ORDER — DEXTROSE 5 % IV SOLN
Freq: Once | INTRAVENOUS | Status: AC
  Administered 2012-11-06: 10:00:00 via INTRAVENOUS

## 2012-11-06 MED ORDER — SODIUM CHLORIDE 0.9 % IJ SOLN
10.0000 mL | INTRAMUSCULAR | Status: DC | PRN
  Filled 2012-11-06: qty 10

## 2012-11-06 MED ORDER — PALONOSETRON HCL INJECTION 0.25 MG/5ML
0.2500 mg | Freq: Once | INTRAVENOUS | Status: AC
  Administered 2012-11-06: 0.25 mg via INTRAVENOUS

## 2012-11-06 MED ORDER — FLUOROURACIL CHEMO INJECTION 2.5 GM/50ML
400.0000 mg/m2 | Freq: Once | INTRAVENOUS | Status: AC
  Administered 2012-11-06: 750 mg via INTRAVENOUS
  Filled 2012-11-06: qty 15

## 2012-11-06 MED ORDER — DEXAMETHASONE SODIUM PHOSPHATE 10 MG/ML IJ SOLN
10.0000 mg | Freq: Once | INTRAMUSCULAR | Status: AC
  Administered 2012-11-06: 10 mg via INTRAVENOUS

## 2012-11-06 MED ORDER — HEPARIN SOD (PORK) LOCK FLUSH 100 UNIT/ML IV SOLN
500.0000 [IU] | Freq: Once | INTRAVENOUS | Status: DC | PRN
  Filled 2012-11-06: qty 5

## 2012-11-06 NOTE — Progress Notes (Signed)
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  CURRENT THERAPY:  Patient is status post 6 cycles of FOLFOX.  INTERIM HISTORY: David Conrad presents today for an office followup visit.  Overall, he, reports, that he is doing relatively well.  He really has responded nicely to the chemotherapy treatments.  His last CEA was 1.4.  4 months ago it was 77.  He is tolerating of FOLFOX well without any major toxicity.  He, reports, that he has a good appetite.  He does have some intermittent nausea in the mornings, but utilizes his anti-emetics.  He denies any active, vomiting.  He denies any diarrhea, constipation.  He denies any obvious, or abnormal bleeding.  He denies any melena, or hematochezia.  He denies any abdominal pain.  He denies any lower leg swelling.  He denies any chest pain, shortness of breath, or cough.  He denies any fevers, chills, or night sweats.  He denies any type of neuropathy.  He denies any headaches, visual changes, or rashes.  Review of Systems: Constitutional:Negative for malaise/fatigue, fever, chills, weight loss, diaphoresis, activity change, appetite change, and unexpected weight change.  HEENT: Negative for double vision, blurred vision, visual loss, ear pain, tinnitus, congestion, rhinorrhea, epistaxis sore throat or sinus disease, oral pain/lesion, tongue soreness Respiratory: Negative for cough, chest tightness, shortness of breath, wheezing and stridor.  Cardiovascular: Negative for chest pain, palpitations, leg swelling, orthopnea, PND, DOE or claudication Gastrointestinal: Negative for nausea, vomiting, abdominal pain, diarrhea, constipation, blood in stool, melena, hematochezia, abdominal distention, anal bleeding, rectal pain, anorexia and hematemesis.  Genitourinary: Negative for dysuria, frequency, hematuria,  Musculoskeletal: Negative for myalgias, back pain, joint swelling, arthralgias and gait problem.  Skin: Negative for rash, color  change, pallor and wound.  Neurological:. Negative for dizziness/light-headedness, tremors, seizures, syncope, facial asymmetry, speech difficulty, weakness, numbness, headaches and paresthesias.  Hematological: Negative for adenopathy. Does not bruise/bleed easily.  Psychiatric/Behavioral:  Negative for depression, no loss of interest in normal activity or change in sleep pattern.   Physical Exam: This is a pleasant, 42 year old, well-developed, well-nourished, African American, gentleman, in no obvious distress Vitals: Temperature 98.6 degrees, pulse 87, respirations 18, blood pressure 106/69, weight 197 pounds HEENT reveals a normocephalic, atraumatic skull, no scleral icterus, no oral lesions  Neck is supple without any cervical or supraclavicular adenopathy.  Lungs are clear to auscultation bilaterally. There are no wheezes, rales or rhonci Cardiac is regular rate and rhythm with a normal S1 and S2. There are no murmurs, rubs, or bruits.  Abdomen is soft with good bowel sounds, there is no palpable mass. There is no palpable hepatosplenomegaly. There is no palpable fluid wave.  Musculoskeletal no tenderness of the spine, ribs, or hips.  Extremities there are no clubbing, cyanosis, or edema.  Skin no petechia, purpura or ecchymosis Neurologic is nonfocal.  Laboratory Data:  white count 5.1, hemoglobin 13.1, hematocrit 40.8, platelets 236,000   Current Outpatient Prescriptions on File Prior to Visit  Medication Sig Dispense Refill  . dexamethasone (DECADRON) 4 MG tablet Take 2 tablets (8 mg total) by mouth 2 (two) times daily with a meal. Take daily starting the day after chemotherapy for 2 days. Take with food.  30 tablet  1  . HYDROcodone-acetaminophen (NORCO/VICODIN) 5-325 MG per tablet Take 1-2 tablets by mouth every 4 (four) hours as needed.  30 tablet  1  . lidocaine-prilocaine (EMLA) cream Apply topically as needed.  30 g  3  . LORazepam (ATIVAN) 0.5 MG  tablet Take 1 tablet (0.5 mg  total) by mouth every 6 (six) hours as needed (Nausea or vomiting).  30 tablet  0  . ondansetron (ZOFRAN) 8 MG tablet Take 8 mg by mouth 2 (two) times daily. Take two times a day starting the day after chemo for 2 days. Then take two times a day as needed for N/V.      . prochlorperazine (COMPAZINE) 10 MG tablet Take 1 tablet (10 mg total) by mouth every 6 (six) hours as needed (Nausea or vomiting).  30 tablet  1   No current facility-administered medications on file prior to visit.   Assessment/Plan: This is a pleasant, 42 year old, African American, gentleman, with the following issues:  #1.  Stage IIIc adenocarcinoma of the descending colon.  He had 6 positive lymph nodes.  His tumor was microsatellite stable.  He was KRAS wild type.  So far, he is tolerating FOLFOX. quite well.  He will go ahead and proceed with his seventh cycle today.  #2.  Intermittent nausea.  He will continue to utilize his anti-emetics.  #3.  Followup.  Mr. Vilar will follow back up with [place] in about one month, but before then should there be questions or concerns.

## 2012-11-06 NOTE — Patient Instructions (Addendum)
[facility] Discharge Instructions for Patients Receiving Chemotherapy  Today you received the following chemotherapy agents Oxaliplatin and 5FU  To help prevent nausea and vomiting after your treatment, we encourage you to take your nausea medication as prescribed   If you develop nausea and vomiting that is not controlled by your nausea medication, call the clinic. If it is after clinic hours your family physician or the after hours number for the clinic or go to the Emergency Department.   BELOW ARE SYMPTOMS THAT SHOULD BE REPORTED IMMEDIATELY:  *FEVER GREATER THAN 100.5 F  *CHILLS WITH OR WITHOUT FEVER  NAUSEA AND VOMITING THAT IS NOT CONTROLLED WITH YOUR NAUSEA MEDICATION  *UNUSUAL SHORTNESS OF BREATH  *UNUSUAL BRUISING OR BLEEDING  TENDERNESS IN MOUTH AND THROAT WITH OR WITHOUT PRESENCE OF ULCERS  *URINARY PROBLEMS  *BOWEL PROBLEMS  UNUSUAL RASH Items with * indicate a potential emergency and should be followed up as soon as possible.  One of the nurses will contact you 24 hours after your treatment. Please let the nurse know about any problems that you may have experienced. Feel free to call the clinic you have any questions or concerns. The clinic phone number is [phone].   I have been informed and understand all the instructions given to me. I know to contact the clinic, my physician, or go to the Emergency Department if any problems should occur. I do not have any questions at this time, but understand that I may call the clinic during office hours   should I have any questions or need assistance in obtaining follow up care.    __________________________________________  _____________  __________ Signature of Patient or Authorized Representative            Date                   Time    __________________________________________ Nurse's Signature    

## 2012-11-08 ENCOUNTER — Ambulatory Visit: Payer: Medicaid Other

## 2012-11-08 NOTE — Progress Notes (Signed)
Pt presents to have pump disconnected, has 12 hours left on infusion. Explained importance of receiving all of drug.  [name], Systems developer, made appointment at [facility] [place] for in the morning. [name], [name] [employer]

## 2012-11-09 ENCOUNTER — Ambulatory Visit (HOSPITAL_BASED_OUTPATIENT_CLINIC_OR_DEPARTMENT_OTHER): Payer: Medicaid Other

## 2012-11-09 DIAGNOSIS — C779 Secondary and unspecified malignant neoplasm of lymph node, unspecified: Secondary | ICD-10-CM

## 2012-11-09 DIAGNOSIS — C182 Malignant neoplasm of ascending colon: Secondary | ICD-10-CM

## 2012-11-09 MED ORDER — HEPARIN SOD (PORK) LOCK FLUSH 100 UNIT/ML IV SOLN
500.0000 [IU] | Freq: Once | INTRAVENOUS | Status: AC | PRN
  Administered 2012-11-09: 500 [IU]
  Filled 2012-11-09: qty 5

## 2012-11-09 MED ORDER — SODIUM CHLORIDE 0.9 % IJ SOLN
10.0000 mL | INTRAMUSCULAR | Status: DC | PRN
  Administered 2012-11-09: 10 mL
  Filled 2012-11-09: qty 10

## 2012-11-14 ENCOUNTER — Encounter (INDEPENDENT_AMBULATORY_CARE_PROVIDER_SITE_OTHER): Payer: Self-pay | Admitting: General Surgery

## 2012-11-14 ENCOUNTER — Ambulatory Visit (INDEPENDENT_AMBULATORY_CARE_PROVIDER_SITE_OTHER): Payer: Medicaid Other | Admitting: General Surgery

## 2012-11-14 VITALS — BP 116/72 | HR 78 | Temp 98.2°F | Resp 16 | Ht 68.0 in | Wt 198.8 lb

## 2012-11-14 DIAGNOSIS — C189 Malignant neoplasm of colon, unspecified: Secondary | ICD-10-CM

## 2012-11-14 DIAGNOSIS — Z9049 Acquired absence of other specified parts of digestive tract: Secondary | ICD-10-CM

## 2012-11-14 NOTE — Progress Notes (Signed)
Patient ID: David Conrad, male   DOB: 1970/12/26, 43 y.o.   MRN: [id number] Is a 42 year old male status post right hemicolectomy secondary to colon cancer, T3 N2 MX. The patient has been followed up with Dr. Myna Hidalgo and is on #6 of 12 cycles of chemotherapy.  The patient has been doing fairly well. He gained approximately 25 pounds since his last visit.  He has times of some depression as well as inactivity. I discussed with him the issues of fatigue and days that is doesn't get out of bed I would encourage him to the  Get outside and do some walking in a park when he does have energy.  His last CEA is 1.4.  On exam: His midline wound is clean dry and intact His abdomen is soft nontender nondistended with active bowel sounds, there's no masses  Assessment and plan: Patient is a 42 year old male status post right hemicolectomy secondary to colon cancer. Patient is to continue undergoing his chemotherapy regimen. The patient will follow up with me in 6 months. The patient will need a one year colonoscopy and follow up with CEA and CT scan.  I appreciate Dr. Gustavo Lah help with his chemotherapy he received followup.

## 2012-11-18 ENCOUNTER — Telehealth: Payer: Self-pay | Admitting: *Deleted

## 2012-11-18 DIAGNOSIS — J029 Acute pharyngitis, unspecified: Secondary | ICD-10-CM

## 2012-11-18 DIAGNOSIS — C182 Malignant neoplasm of ascending colon: Secondary | ICD-10-CM

## 2012-11-18 MED ORDER — AZITHROMYCIN 250 MG PO TABS: ORAL_TABLET | ORAL | Status: DC

## 2012-11-18 NOTE — Telephone Encounter (Signed)
Pt called with c/o sore throat and productive cough of yellow sputum. Denies any fevers. Reviewed with Dr Myna Hidalgo. To start Z-pack. The pt knows to call the office if his sx don't improve and will check in tomorrow afternoon to discuss options for tx this week.

## 2012-11-19 ENCOUNTER — Other Ambulatory Visit: Payer: Self-pay | Admitting: *Deleted

## 2012-11-20 ENCOUNTER — Ambulatory Visit: Payer: Medicaid Other

## 2012-11-20 ENCOUNTER — Other Ambulatory Visit: Payer: Self-pay | Admitting: *Deleted

## 2012-11-20 ENCOUNTER — Telehealth: Payer: Self-pay | Admitting: Hematology & Oncology

## 2012-11-20 ENCOUNTER — Other Ambulatory Visit: Payer: Medicaid Other | Admitting: Lab

## 2012-11-20 NOTE — Telephone Encounter (Signed)
Left pt message with 4-9 and 4-23 appointments

## 2012-11-25 ENCOUNTER — Encounter: Payer: Self-pay | Admitting: *Deleted

## 2012-11-25 NOTE — Progress Notes (Signed)
Pt's records from 07/11/12 to present faxed to [employer] in [place] as requested.

## 2012-11-26 ENCOUNTER — Telehealth: Payer: Self-pay | Admitting: Hematology & Oncology

## 2012-11-26 NOTE — Telephone Encounter (Signed)
Patient called and cx 11/27/12 apt due to transportation problems and resch for 12/03/12

## 2012-11-27 ENCOUNTER — Ambulatory Visit: Payer: Medicaid Other

## 2012-11-27 ENCOUNTER — Other Ambulatory Visit: Payer: Medicaid Other | Admitting: Lab

## 2012-12-03 ENCOUNTER — Other Ambulatory Visit (HOSPITAL_BASED_OUTPATIENT_CLINIC_OR_DEPARTMENT_OTHER): Payer: Medicaid Other | Admitting: Lab

## 2012-12-03 ENCOUNTER — Ambulatory Visit (HOSPITAL_BASED_OUTPATIENT_CLINIC_OR_DEPARTMENT_OTHER): Payer: Medicaid Other

## 2012-12-03 VITALS — BP 107/75 | HR 99 | Temp 98.6°F | Resp 20

## 2012-12-03 DIAGNOSIS — C779 Secondary and unspecified malignant neoplasm of lymph node, unspecified: Secondary | ICD-10-CM

## 2012-12-03 DIAGNOSIS — Z5111 Encounter for antineoplastic chemotherapy: Secondary | ICD-10-CM

## 2012-12-03 DIAGNOSIS — C182 Malignant neoplasm of ascending colon: Secondary | ICD-10-CM

## 2012-12-03 LAB — CBC WITH DIFFERENTIAL (CANCER CENTER ONLY)
BASO#: 0 10*3/uL (ref 0.0–0.2)
Eosinophils Absolute: 0.3 10*3/uL (ref 0.0–0.5)
HCT: 40.3 % (ref 38.7–49.9)
LYMPH%: 41.6 % (ref 14.0–48.0)
MCH: 24.6 pg — ABNORMAL LOW (ref 28.0–33.4)
MCV: 78 fL — ABNORMAL LOW (ref 82–98)
MONO#: 0.8 10*3/uL (ref 0.1–0.9)
NEUT%: 35.1 % — ABNORMAL LOW (ref 40.0–80.0)
RBC: 5.2 10*6/uL (ref 4.20–5.70)
WBC: 4.7 10*3/uL (ref 4.0–10.0)

## 2012-12-03 LAB — COMPREHENSIVE METABOLIC PANEL
BUN: 14 mg/dL (ref 6–23)
CO2: 23 mEq/L (ref 19–32)
Creatinine, Ser: 1.04 mg/dL (ref 0.50–1.35)
Glucose, Bld: 142 mg/dL — ABNORMAL HIGH (ref 70–99)
Total Bilirubin: 0.3 mg/dL (ref 0.3–1.2)
Total Protein: 6.5 g/dL (ref 6.0–8.3)

## 2012-12-03 LAB — CEA: CEA: 1.8 ng/mL (ref 0.0–5.0)

## 2012-12-03 MED ORDER — DEXTROSE 5 % IV SOLN
Freq: Once | INTRAVENOUS | Status: AC
  Administered 2012-12-03: 09:00:00 via INTRAVENOUS

## 2012-12-03 MED ORDER — OXALIPLATIN CHEMO INJECTION 100 MG/20ML
85.0000 mg/m2 | Freq: Once | INTRAVENOUS | Status: AC
  Administered 2012-12-03: 160 mg via INTRAVENOUS
  Filled 2012-12-03: qty 32

## 2012-12-03 MED ORDER — HEPARIN SOD (PORK) LOCK FLUSH 100 UNIT/ML IV SOLN
500.0000 [IU] | Freq: Once | INTRAVENOUS | Status: DC | PRN
  Filled 2012-12-03: qty 5

## 2012-12-03 MED ORDER — DEXAMETHASONE SODIUM PHOSPHATE 10 MG/ML IJ SOLN
10.0000 mg | Freq: Once | INTRAMUSCULAR | Status: AC
  Administered 2012-12-03: 10 mg via INTRAVENOUS

## 2012-12-03 MED ORDER — SODIUM CHLORIDE 0.9 % IV SOLN
2400.0000 mg/m2 | INTRAVENOUS | Status: DC
  Administered 2012-12-03: 4550 mg via INTRAVENOUS
  Filled 2012-12-03: qty 91

## 2012-12-03 MED ORDER — SODIUM CHLORIDE 0.9 % IJ SOLN
10.0000 mL | INTRAMUSCULAR | Status: DC | PRN
  Filled 2012-12-03: qty 10

## 2012-12-03 MED ORDER — PALONOSETRON HCL INJECTION 0.25 MG/5ML
0.2500 mg | Freq: Once | INTRAVENOUS | Status: AC
  Administered 2012-12-03: 0.25 mg via INTRAVENOUS

## 2012-12-03 MED ORDER — FLUOROURACIL CHEMO INJECTION 2.5 GM/50ML
400.0000 mg/m2 | Freq: Once | INTRAVENOUS | Status: AC
  Administered 2012-12-03: 750 mg via INTRAVENOUS
  Filled 2012-12-03: qty 15

## 2012-12-03 MED ORDER — LEUCOVORIN CALCIUM INJECTION 350 MG
400.0000 mg/m2 | Freq: Once | INTRAVENOUS | Status: AC
  Administered 2012-12-03: 756 mg via INTRAVENOUS
  Filled 2012-12-03: qty 37.8

## 2012-12-03 NOTE — Patient Instructions (Signed)
[facility] Discharge Instructions for Patients Receiving Chemotherapy  Today you received the following chemotherapy agents Oxaliplatin and 5FU  To help prevent nausea and vomiting after your treatment, we encourage you to take your nausea medication as prescribed   If you develop nausea and vomiting that is not controlled by your nausea medication, call the clinic. If it is after clinic hours your family physician or the after hours number for the clinic or go to the Emergency Department.   BELOW ARE SYMPTOMS THAT SHOULD BE REPORTED IMMEDIATELY:  *FEVER GREATER THAN 100.5 F  *CHILLS WITH OR WITHOUT FEVER  NAUSEA AND VOMITING THAT IS NOT CONTROLLED WITH YOUR NAUSEA MEDICATION  *UNUSUAL SHORTNESS OF BREATH  *UNUSUAL BRUISING OR BLEEDING  TENDERNESS IN MOUTH AND THROAT WITH OR WITHOUT PRESENCE OF ULCERS  *URINARY PROBLEMS  *BOWEL PROBLEMS  UNUSUAL RASH Items with * indicate a potential emergency and should be followed up as soon as possible.  One of the nurses will contact you 24 hours after your treatment. Please let the nurse know about any problems that you may have experienced. Feel free to call the clinic you have any questions or concerns. The clinic phone number is [phone].   I have been informed and understand all the instructions given to me. I know to contact the clinic, my physician, or go to the Emergency Department if any problems should occur. I do not have any questions at this time, but understand that I may call the clinic during office hours   should I have any questions or need assistance in obtaining follow up care.    __________________________________________  _____________  __________ Signature of Patient or Authorized Representative            Date                   Time    __________________________________________ Nurse's Signature

## 2012-12-04 ENCOUNTER — Ambulatory Visit: Payer: Medicaid Other

## 2012-12-04 ENCOUNTER — Other Ambulatory Visit: Payer: Medicaid Other | Admitting: Lab

## 2012-12-04 ENCOUNTER — Ambulatory Visit: Payer: Medicaid Other | Admitting: Hematology & Oncology

## 2012-12-05 ENCOUNTER — Ambulatory Visit (HOSPITAL_BASED_OUTPATIENT_CLINIC_OR_DEPARTMENT_OTHER): Payer: Medicaid Other

## 2012-12-05 VITALS — BP 124/91 | HR 90 | Temp 98.0°F | Resp 24

## 2012-12-05 DIAGNOSIS — G5790 Unspecified mononeuropathy of unspecified lower limb: Secondary | ICD-10-CM

## 2012-12-05 DIAGNOSIS — C182 Malignant neoplasm of ascending colon: Secondary | ICD-10-CM

## 2012-12-05 MED ORDER — HEPARIN SOD (PORK) LOCK FLUSH 100 UNIT/ML IV SOLN
500.0000 [IU] | Freq: Once | INTRAVENOUS | Status: AC | PRN
  Administered 2012-12-05: 500 [IU]
  Filled 2012-12-05: qty 5

## 2012-12-05 MED ORDER — VITAMIN B-6 250 MG PO TABS: 250.0000 mg | ORAL_TABLET | Freq: Every day | ORAL | Status: DC

## 2012-12-05 MED ORDER — DULOXETINE HCL 60 MG PO CPEP: 60.0000 mg | ORAL_CAPSULE | Freq: Every day | ORAL | Status: DC

## 2012-12-05 MED ORDER — SODIUM CHLORIDE 0.9 % IJ SOLN
3.0000 mL | INTRAMUSCULAR | Status: DC | PRN
Start: 2012-12-05 — End: 2012-12-05
  Filled 2012-12-05: qty 10

## 2012-12-05 MED ORDER — SODIUM CHLORIDE 0.9 % IJ SOLN
10.0000 mL | INTRAMUSCULAR | Status: DC | PRN
  Administered 2012-12-05: 10 mL
  Filled 2012-12-05: qty 10

## 2012-12-05 NOTE — Progress Notes (Signed)
I spoke to [name] in today. He is a 41 year old gentleman with stage IIIC COLON cancer. He's had a 7 cycles of FOLFOX. 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  Is very emotional. I spoke to her about this. I told him that his concerned happen with his chemotherapy.  Is a responded nicely to chemotherapy therapy. His CEA is normal. He's really had no other symptoms.  His mostly bothered by neuropathy in his feet.  He's had no fever. He's had no sweats. His appetite been pretty good. He's going to the bathroom okay.  There is some neuropathy in his hands.  I will try some Cymbalta on him. We'll try the 60 mg dose and see if that does not help.  I also told to take vitamin B 6 at 100 mg twice a day.  On his physical, his vital signs are all stable. There is no swelling in his legs. There is some increased sensitivity to touch in his feet. He has good pulses. Lungs are clear bilaterally. Cardiac exam regular rate and rhythm. Abdominal exam is soft. His laparotomy scar is well-healed.  We will just to omit the oxaliplatin from his next cycle of chemotherapy and possibly future cycles depending on his neuropathy.  I want to try to be aggressive with his adjuvant chemotherapy as his risk of recurrence is at significant.  David E.  Hebrews 12:12

## 2012-12-05 NOTE — Patient Instructions (Signed)
Peripheral Neuropathy Peripheral neuropathy is a common disorder of your nerves resulting from damage. CAUSES  This disorder may be caused by a disease of the nerves or illness. Many neuropathies have well known causes such as:  Diabetes. This is one of the most common causes.   Uremia.   AIDS.   Nutritional deficiencies.   Other causes include mechanical pressures. These may be from:   Compression.   Injury.   Contusions or bruises.   Fracture or dislocated bones.   Pressure involving the nerves close to the surface. Nerves such as the ulnar, or radial can be injured by prolonged use of crutches.  Other injuries may come from:  Tumor.   Hemorrhage or bleeding into a nerve.   Exposure to cold or radiation.   Certain medicines or toxic substances (rare).   Vascular or collagen disorders such as:   Atherosclerosis.   Systemic lupus erythematosus.   Scleroderma.   Sarcoidosis.   Rheumatoid arthritis.   Polyarteritis nodosa.   A large number of cases are of unknown cause.  SYMPTOMS  Common problems include:  Weakness.   Numbness.   Abnormal sensations (paresthesia) such as:   Burning.   Tickling.   Pricking.   Tingling.   Pain in the arms, hands, legs and/or feet.  TREATMENT  Therapy for this disorder differs depending on the cause. It may vary from medical treatment with medications or physical therapy among others.   For example, therapy for this disorder caused by diabetes involves control of the diabetes.   In cases where a tumor or ruptured disc is the cause, therapy may involve surgery. This would be to remove the tumor or to repair the ruptured disc.   In entrapment or compression neuropathy, treatment may consist of splinting or surgical decompression of the ulnar or median nerves. A common example of entrapment neuropathy is carpal tunnel syndrome. This has become more common because of the increasing use of computers.   Peroneal and  radial compression neuropathies may require avoidance of pressure.   Physical therapy and/or splints may be useful in preventing contractures. This is a condition in which shortened muscles around joints cause abnormal and sometimes painful positioning of the joints.  Document Released: 07/28/2002 Document Revised: 04/19/2011 Document Reviewed: 08/07/2005 [employer] Patient Information 2012 [employer]. 

## 2012-12-11 ENCOUNTER — Other Ambulatory Visit: Payer: Medicaid Other | Admitting: Lab

## 2012-12-11 ENCOUNTER — Ambulatory Visit: Payer: Medicaid Other

## 2012-12-11 ENCOUNTER — Ambulatory Visit: Payer: Medicaid Other | Admitting: Medical

## 2012-12-16 ENCOUNTER — Other Ambulatory Visit: Payer: Self-pay | Admitting: *Deleted

## 2012-12-16 DIAGNOSIS — C182 Malignant neoplasm of ascending colon: Secondary | ICD-10-CM

## 2012-12-16 MED ORDER — DEXAMETHASONE 4 MG PO TABS: 8.0000 mg | ORAL_TABLET | Freq: Two times a day (BID) | ORAL | Status: DC

## 2012-12-16 NOTE — Telephone Encounter (Signed)
Pt called the office to request a refill for Decadron. Due for chemo tomorrow. Sent to [facility] pharmacy as requested.

## 2012-12-17 ENCOUNTER — Ambulatory Visit (HOSPITAL_BASED_OUTPATIENT_CLINIC_OR_DEPARTMENT_OTHER): Payer: Medicaid Other | Admitting: Medical

## 2012-12-17 ENCOUNTER — Other Ambulatory Visit (HOSPITAL_BASED_OUTPATIENT_CLINIC_OR_DEPARTMENT_OTHER): Payer: Medicaid Other | Admitting: Lab

## 2012-12-17 ENCOUNTER — Ambulatory Visit (HOSPITAL_BASED_OUTPATIENT_CLINIC_OR_DEPARTMENT_OTHER): Payer: Medicaid Other

## 2012-12-17 VITALS — BP 116/81 | HR 76 | Temp 97.5°F | Resp 18 | Ht 68.0 in | Wt 203.0 lb

## 2012-12-17 DIAGNOSIS — G620 Drug-induced polyneuropathy: Secondary | ICD-10-CM

## 2012-12-17 DIAGNOSIS — C182 Malignant neoplasm of ascending colon: Secondary | ICD-10-CM

## 2012-12-17 DIAGNOSIS — R110 Nausea: Secondary | ICD-10-CM

## 2012-12-17 DIAGNOSIS — Z5111 Encounter for antineoplastic chemotherapy: Secondary | ICD-10-CM

## 2012-12-17 LAB — COMPREHENSIVE METABOLIC PANEL
ALT: 19 U/L (ref 0–53)
Alkaline Phosphatase: 68 U/L (ref 39–117)
CO2: 25 mEq/L (ref 19–32)
Creatinine, Ser: 1.07 mg/dL (ref 0.50–1.35)
Glucose, Bld: 91 mg/dL (ref 70–99)
Sodium: 139 mEq/L (ref 135–145)
Total Bilirubin: 0.5 mg/dL (ref 0.3–1.2)
Total Protein: 6.2 g/dL (ref 6.0–8.3)

## 2012-12-17 LAB — CBC WITH DIFFERENTIAL (CANCER CENTER ONLY)
BASO%: 0.2 % (ref 0.0–2.0)
HCT: 40.9 % (ref 38.7–49.9)
LYMPH%: 35 % (ref 14.0–48.0)
MCH: 25.2 pg — ABNORMAL LOW (ref 28.0–33.4)
MCHC: 31.8 g/dL — ABNORMAL LOW (ref 32.0–35.9)
MCV: 79 fL — ABNORMAL LOW (ref 82–98)
MONO%: 22.2 % — ABNORMAL HIGH (ref 0.0–13.0)
NEUT%: 38.6 % — ABNORMAL LOW (ref 40.0–80.0)
Platelets: 154 10*3/uL (ref 145–400)
RDW: 19.4 % — ABNORMAL HIGH (ref 11.1–15.7)

## 2012-12-17 MED ORDER — LEUCOVORIN CALCIUM INJECTION 350 MG
400.0000 mg/m2 | Freq: Once | INTRAVENOUS | Status: AC
  Administered 2012-12-17: 756 mg via INTRAVENOUS
  Filled 2012-12-17: qty 37.8

## 2012-12-17 MED ORDER — SODIUM CHLORIDE 0.9 % IJ SOLN
10.0000 mL | INTRAMUSCULAR | Status: DC | PRN
  Filled 2012-12-17: qty 10

## 2012-12-17 MED ORDER — PALONOSETRON HCL INJECTION 0.25 MG/5ML: 0.2500 mg | Freq: Once | INTRAVENOUS | Status: DC

## 2012-12-17 MED ORDER — SODIUM CHLORIDE 0.9 % IV SOLN
Freq: Once | INTRAVENOUS | Status: AC
  Administered 2012-12-17: 11:00:00 via INTRAVENOUS

## 2012-12-17 MED ORDER — FLUOROURACIL CHEMO INJECTION 2.5 GM/50ML
400.0000 mg/m2 | Freq: Once | INTRAVENOUS | Status: AC
  Administered 2012-12-17: 750 mg via INTRAVENOUS
  Filled 2012-12-17: qty 15

## 2012-12-17 MED ORDER — HEPARIN SOD (PORK) LOCK FLUSH 100 UNIT/ML IV SOLN
500.0000 [IU] | Freq: Once | INTRAVENOUS | Status: DC | PRN
  Filled 2012-12-17: qty 5

## 2012-12-17 MED ORDER — DEXAMETHASONE SODIUM PHOSPHATE 10 MG/ML IJ SOLN
10.0000 mg | Freq: Once | INTRAMUSCULAR | Status: AC
  Administered 2012-12-17: 10 mg via INTRAVENOUS

## 2012-12-17 MED ORDER — FLUOROURACIL CHEMO INJECTION 5 GM/100ML
2400.0000 mg/m2 | INTRAVENOUS | Status: DC
  Administered 2012-12-17: 4550 mg via INTRAVENOUS
  Filled 2012-12-17: qty 91

## 2012-12-17 NOTE — Progress Notes (Signed)
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  CURRENT THERAPY:  Patient is status post 7 cycles of FOLFOX.  INTERIM HISTORY: David Conrad presents today for an office followup visit.  Overall, he, reports, that he is doing relatively well.  He did start having some toxicity in the form of neuropathy.  He has been placed on Cymbalta and vitamin B6.  We also decided to omit, oxalic plan for the time being.  His neuropathy, is mostly in his feet, but also in his hands. He really has responded nicely to the chemotherapy treatments.  His last CEA was 1.8.  Six months ago it was 77.    He, reports, that he has a good appetite.  He does have some intermittent nausea in the mornings, but utilizes his anti-emetics.  He denies any active, vomiting.  He denies any diarrhea, constipation.  He denies any obvious, or abnormal bleeding.  He denies any melena, or hematochezia.  He denies any abdominal pain.  He denies any lower leg swelling.  He denies any chest pain, shortness of breath, or cough.  He denies any fevers, chills, or night sweats. He denies any headaches, visual changes, or rashes.  Review of Systems: Constitutional:Negative for malaise/fatigue, fever, chills, weight loss, diaphoresis, activity change, appetite change, and unexpected weight change.  HEENT: Negative for double vision, blurred vision, visual loss, ear pain, tinnitus, congestion, rhinorrhea, epistaxis sore throat or sinus disease, oral pain/lesion, tongue soreness Respiratory: Negative for cough, chest tightness, shortness of breath, wheezing and stridor.  Cardiovascular: Negative for chest pain, palpitations, leg swelling, orthopnea, PND, DOE or claudication Gastrointestinal: Negative for nausea, vomiting, abdominal pain, diarrhea, constipation, blood in stool, melena, hematochezia, abdominal distention, anal bleeding, rectal pain, anorexia and hematemesis.  Genitourinary: Negative for dysuria, frequency,  hematuria,  Musculoskeletal: Negative for myalgias, back pain, joint swelling, arthralgias and gait problem.  Skin: Negative for rash, color change, pallor and wound.  Neurological:. Negative for dizziness/light-headedness, tremors, seizures, syncope, facial asymmetry, speech difficulty, weakness, numbness, headaches and paresthesias.  Hematological: Negative for adenopathy. Does not bruise/bleed easily.  Psychiatric/Behavioral:  Negative for depression, no loss of interest in normal activity or change in sleep pattern.   Physical Exam: This is a pleasant, 42 year old, well-developed, well-nourished, African American, gentleman, in no obvious distress Vitals:  temperature 97.5 degrees, pulse 76, respirations 18, blood pressure 116/81, weight 203 pounds HEENT reveals a normocephalic, atraumatic skull, no scleral icterus, no oral lesions  Neck is supple without any cervical or supraclavicular adenopathy.  Lungs are clear to auscultation bilaterally. There are no wheezes, rales or rhonci Cardiac is regular rate and rhythm with a normal S1 and S2. There are no murmurs, rubs, or bruits.  Abdomen is soft with good bowel sounds, there is no palpable mass. There is no palpable hepatosplenomegaly. There is no palpable fluid wave.  Musculoskeletal no tenderness of the spine, ribs, or hips.  Extremities there are no clubbing, cyanosis, or edema.  Skin no petechia, purpura or ecchymosis Neurologic is nonfocal.  Laboratory Data:  White count 4.5, hemoglobin 13.0, hematocrit 40.9, platelets 154,000  Current Outpatient Prescriptions on File Prior to Visit  Medication Sig Dispense Refill  . dexamethasone (DECADRON) 4 MG tablet Take 2 tablets (8 mg total) by mouth 2 (two) times daily with a meal. Take daily starting the day after chemotherapy for 2 days. Take with food.  30 tablet  1  . HYDROcodone-acetaminophen (NORCO/VICODIN) 5-325 MG per tablet Take 1-2 tablets by mouth every 4 (  four) hours as needed.  30  tablet  1  . lidocaine-prilocaine (EMLA) cream Apply topically as needed.  30 g  3  . LORazepam (ATIVAN) 0.5 MG tablet Take 1 tablet (0.5 mg total) by mouth every 6 (six) hours as needed (Nausea or vomiting).  30 tablet  0  . ondansetron (ZOFRAN) 8 MG tablet Take 8 mg by mouth 2 (two) times daily. Take two times a day starting the day after chemo for 2 days. Then take two times a day as needed for N/V.      . prochlorperazine (COMPAZINE) 10 MG tablet Take 1 tablet (10 mg total) by mouth every 6 (six) hours as needed (Nausea or vomiting).  30 tablet  1   No current facility-administered medications on file prior to visit.   Assessment/Plan: This is a pleasant, 42 year old, African American, gentleman, with the following issues:  #1.  Stage IIIc adenocarcinoma of the descending colon.  He had 6 positive lymph nodes.  His tumor was microsatellite stable.  He was KRAS wild type.  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  We have decided to go ahead and omit, the oxaliplatin for the time being.he will proceed with his eighth cycle today.    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  This is mostly in his hands and feet.  He is on Cymbalta and vitamin B6.  For the time being.  We will discontinue the oxaliplatin.  #3.  Intermittent nausea.  He will continue to utilize his anti-emetics.  #4.  Followup.  David Conrad will follow back up with [place] in two weeks, but before then should there be questions or concerns.

## 2012-12-18 ENCOUNTER — Other Ambulatory Visit: Payer: Medicaid Other | Admitting: Lab

## 2012-12-18 ENCOUNTER — Ambulatory Visit: Payer: Medicaid Other

## 2012-12-19 ENCOUNTER — Ambulatory Visit (HOSPITAL_BASED_OUTPATIENT_CLINIC_OR_DEPARTMENT_OTHER): Payer: Medicaid Other

## 2012-12-19 DIAGNOSIS — C182 Malignant neoplasm of ascending colon: Secondary | ICD-10-CM

## 2012-12-19 MED ORDER — HEPARIN SOD (PORK) LOCK FLUSH 100 UNIT/ML IV SOLN
500.0000 [IU] | Freq: Once | INTRAVENOUS | Status: AC | PRN
  Administered 2012-12-19: 500 [IU]
  Filled 2012-12-19: qty 5

## 2012-12-19 MED ORDER — SODIUM CHLORIDE 0.9 % IJ SOLN
10.0000 mL | INTRAMUSCULAR | Status: DC | PRN
  Administered 2012-12-19: 10 mL
  Filled 2012-12-19: qty 10

## 2012-12-19 NOTE — Patient Instructions (Signed)

## 2012-12-19 NOTE — Progress Notes (Signed)
David Conrad presented for Portacath access and flush. Proper placement of portacath confirmed by CXR. Portacath located in the right chest wall accessed with  H 20 needle.  Good blood return present. Portacath flushed with 20ml NS and 500U/39ml Heparin per protocol and needle removed intact. Procedure without incident. Patient tolerated procedure well.

## 2012-12-25 ENCOUNTER — Other Ambulatory Visit: Payer: Medicaid Other | Admitting: Lab

## 2012-12-25 ENCOUNTER — Ambulatory Visit: Payer: Medicaid Other

## 2012-12-31 ENCOUNTER — Ambulatory Visit (HOSPITAL_BASED_OUTPATIENT_CLINIC_OR_DEPARTMENT_OTHER): Payer: Medicaid Other

## 2012-12-31 ENCOUNTER — Ambulatory Visit (HOSPITAL_BASED_OUTPATIENT_CLINIC_OR_DEPARTMENT_OTHER): Payer: Medicaid Other | Admitting: Hematology & Oncology

## 2012-12-31 ENCOUNTER — Other Ambulatory Visit (HOSPITAL_BASED_OUTPATIENT_CLINIC_OR_DEPARTMENT_OTHER): Payer: Medicaid Other | Admitting: Lab

## 2012-12-31 VITALS — BP 122/82 | HR 85 | Temp 98.8°F | Resp 20

## 2012-12-31 VITALS — BP 112/77 | HR 77 | Temp 98.1°F | Resp 18 | Ht 68.0 in | Wt 202.0 lb

## 2012-12-31 DIAGNOSIS — C182 Malignant neoplasm of ascending colon: Secondary | ICD-10-CM

## 2012-12-31 DIAGNOSIS — G589 Mononeuropathy, unspecified: Secondary | ICD-10-CM

## 2012-12-31 DIAGNOSIS — C779 Secondary and unspecified malignant neoplasm of lymph node, unspecified: Secondary | ICD-10-CM

## 2012-12-31 DIAGNOSIS — Z5111 Encounter for antineoplastic chemotherapy: Secondary | ICD-10-CM

## 2012-12-31 LAB — COMPREHENSIVE METABOLIC PANEL
ALT: 28 U/L (ref 0–53)
Albumin: 4.3 g/dL (ref 3.5–5.2)
CO2: 23 mEq/L (ref 19–32)
Calcium: 9.3 mg/dL (ref 8.4–10.5)
Chloride: 104 mEq/L (ref 96–112)
Potassium: 4.1 mEq/L (ref 3.5–5.3)
Sodium: 136 mEq/L (ref 135–145)
Total Bilirubin: 0.5 mg/dL (ref 0.3–1.2)
Total Protein: 6.6 g/dL (ref 6.0–8.3)

## 2012-12-31 LAB — CBC WITH DIFFERENTIAL (CANCER CENTER ONLY)
BASO%: 0.2 % (ref 0.0–2.0)
LYMPH#: 1.6 10*3/uL (ref 0.9–3.3)
MONO#: 0.9 10*3/uL (ref 0.1–0.9)
Platelets: 148 10*3/uL (ref 145–400)
RBC: 5.38 10*6/uL (ref 4.20–5.70)
RDW: 19.8 % — ABNORMAL HIGH (ref 11.1–15.7)
WBC: 4.8 10*3/uL (ref 4.0–10.0)

## 2012-12-31 MED ORDER — HEPARIN SOD (PORK) LOCK FLUSH 100 UNIT/ML IV SOLN
500.0000 [IU] | Freq: Once | INTRAVENOUS | Status: DC | PRN
  Filled 2012-12-31: qty 5

## 2012-12-31 MED ORDER — SODIUM CHLORIDE 0.9 % IJ SOLN
10.0000 mL | INTRAMUSCULAR | Status: DC | PRN
  Filled 2012-12-31: qty 10

## 2012-12-31 MED ORDER — DEXAMETHASONE SODIUM PHOSPHATE 10 MG/ML IJ SOLN: 10.0000 mg | Freq: Once | INTRAMUSCULAR | Status: DC

## 2012-12-31 MED ORDER — PROCHLORPERAZINE MALEATE 10 MG PO TABS
10.0000 mg | ORAL_TABLET | Freq: Once | ORAL | Status: AC
  Administered 2012-12-31: 10 mg via ORAL

## 2012-12-31 MED ORDER — FLUOROURACIL CHEMO INJECTION 2.5 GM/50ML
400.0000 mg/m2 | Freq: Once | INTRAVENOUS | Status: AC
  Administered 2012-12-31: 750 mg via INTRAVENOUS
  Filled 2012-12-31: qty 15

## 2012-12-31 MED ORDER — SODIUM CHLORIDE 0.9 % IV SOLN
INTRAVENOUS | Status: DC
  Administered 2012-12-31: 10:00:00 via INTRAVENOUS

## 2012-12-31 MED ORDER — PALONOSETRON HCL INJECTION 0.25 MG/5ML: 0.2500 mg | Freq: Once | INTRAVENOUS | Status: DC

## 2012-12-31 MED ORDER — DEXTROSE 5 % IV SOLN
400.0000 mg/m2 | Freq: Once | INTRAVENOUS | Status: AC
  Administered 2012-12-31: 756 mg via INTRAVENOUS
  Filled 2012-12-31: qty 37.8

## 2012-12-31 MED ORDER — SODIUM CHLORIDE 0.9 % IV SOLN
2400.0000 mg/m2 | INTRAVENOUS | Status: DC
  Administered 2012-12-31: 4550 mg via INTRAVENOUS
  Filled 2012-12-31: qty 91

## 2012-12-31 NOTE — Progress Notes (Signed)
This office note has been dictated.

## 2012-12-31 NOTE — Progress Notes (Signed)
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  CURRENT THERAPY:  The patient is status post 9 cycles of FOLFOX (oxaliplatin on hold secondary to neuropathy).  INTERIM HISTORY:  Mr. Mroczkowski comes in for his followup.  The chemo is starting to wear him down a little bit.  He is still bothered by neuropathy.  Again, we have held his oxaliplatin.  I suspect that we will have to hold oxaliplatin until he completes his treatments.  He has had some diarrhea.  I told him to try some over-the-counter Imodium or Pepto-Bismol.  If these do not work, then we can certainly use Lomotil.  His last CEA was 1.8 back in April.  This has been holding stable.  He has had no obvious bleeding.  There has been no abdominal pain.  He has had no headache.  He is quite emotional today.  We had some good fellowship.  His mom is an incredibly [place] woman that is helping him get through the chemotherapy.  He is having a lot of problems at home.  A son of his was kicked out of school.  His son has psychological issues that are trying to be addressed.  His twin babies are going to be born some time in May.  PHYSICAL EXAMINATION:  General:  This is a well-developed, well- nourished black gentleman in no obvious distress.  Vital signs:  Show temperature of 98.1, pulse 77, respiratory rate 18, blood pressure 112/77.  Weight is 202.  Head and neck:  Shows a normocephalic, atraumatic skull.  There are no ocular or oral lesions.  There are no palpable cervical or supraclavicular lymph nodes.  Lungs:  His lungs are clear bilaterally.  Cardiac:  Regular rate and rhythm with normal S1, S2.  There are no murmurs, rubs or bruits.  Abdomen:  Soft with good bowel sounds.  He has a well-healed laparotomy scar.  There is no fluid wave.  There is no guarding or rebound tenderness.  There is no palpable hepatosplenomegaly.  Back:  No tenderness of the spine, ribs or hips. Extremities:  Show no  clubbing, cyanosis or edema.  He may have some slight hypersensitivity to touch on his feet.  Neurological:  Shows the increased sensitivity to touch on his feet.  LABORATORY STUDIES:  White cell count is 4.8, hemoglobin 13.8, hematocrit 42.5, platelet count 148.  IMPRESSION:  Mr. Hoge is a 42 year old black gentleman with stage IIIC adenocarcinoma of the ascending colon.  He is doing quite nicely right now.  Again, we are holding the oxaliplatin.  He is on vitamin B6 to try to help with the neuropathy.  Of note, he had 6 lymph nodes that were positive.  His tumor was wild type for KRAS mutation.  We will go ahead and plan for his 10th cycle of chemo today.  I will plan to get him back in 2 more weeks for his 11th cycle.  Again, we will probably have to hold on the oxaliplatin when we see him back.    ______________________________ Josph Macho, M.D. PRE/MEDQ  D:  12/31/2012  T:  12/31/2012  Job:  [id number]

## 2013-01-02 ENCOUNTER — Ambulatory Visit (HOSPITAL_BASED_OUTPATIENT_CLINIC_OR_DEPARTMENT_OTHER): Payer: Medicaid Other

## 2013-01-02 DIAGNOSIS — C779 Secondary and unspecified malignant neoplasm of lymph node, unspecified: Secondary | ICD-10-CM

## 2013-01-02 DIAGNOSIS — C182 Malignant neoplasm of ascending colon: Secondary | ICD-10-CM

## 2013-01-02 MED ORDER — SODIUM CHLORIDE 0.9 % IJ SOLN
10.0000 mL | INTRAMUSCULAR | Status: DC | PRN
  Administered 2013-01-02: 10 mL
  Filled 2013-01-02: qty 10

## 2013-01-02 MED ORDER — HEPARIN SOD (PORK) LOCK FLUSH 100 UNIT/ML IV SOLN
500.0000 [IU] | Freq: Once | INTRAVENOUS | Status: AC | PRN
  Administered 2013-01-02: 500 [IU]
  Filled 2013-01-02: qty 5

## 2013-01-02 NOTE — Patient Instructions (Signed)
Fluorouracil, 5-FU injection What is this medicine? FLUOROURACIL, 5-FU (flure oh YOOR a sil) is a chemotherapy drug. It slows the growth of cancer cells. This medicine is used to treat many types of cancer like breast cancer, colon or rectal cancer, pancreatic cancer, and stomach cancer. This medicine may be used for other purposes; ask your health care provider or pharmacist if you have questions. What should I tell my health care provider before I take this medicine? They need to know if you have any of these conditions: -blood disorders -dihydropyrimidine dehydrogenase (DPD) deficiency -infection (especially a virus infection such as chickenpox, cold sores, or herpes) -kidney disease -liver disease -malnourished, poor nutrition -recent or ongoing radiation therapy -an unusual or allergic reaction to fluorouracil, other chemotherapy, other medicines, foods, dyes, or preservatives -pregnant or trying to get pregnant -breast-feeding How should I use this medicine? This drug is given as an infusion or injection into a vein. It is administered in a hospital or clinic by a specially trained health care professional. Talk to your pediatrician regarding the use of this medicine in children. Special care may be needed. Overdosage: If you think you have taken too much of this medicine contact a poison control center or emergency room at once. NOTE: This medicine is only for you. Do not share this medicine with others. What if I miss a dose? It is important not to miss your dose. Call your doctor or health care professional if you are unable to keep an appointment. What may interact with this medicine? -allopurinol -cimetidine -dapsone -digoxin -hydroxyurea -leucovorin -levamisole -medicines for seizures like ethotoin, fosphenytoin, phenytoin -medicines to increase blood counts like filgrastim, pegfilgrastim, sargramostim -medicines that treat or prevent blood clots like warfarin,  enoxaparin, and dalteparin -methotrexate -metronidazole -pyrimethamine -some other chemotherapy drugs like busulfan, cisplatin, estramustine, vinblastine -trimethoprim -trimetrexate -vaccines Talk to your doctor or health care professional before taking any of these medicines: -acetaminophen -aspirin -ibuprofen -ketoprofen -naproxen This list may not describe all possible interactions. Give your health care provider a list of all the medicines, herbs, non-prescription drugs, or dietary supplements you use. Also tell them if you smoke, drink alcohol, or use illegal drugs. Some items may interact with your medicine. What should I watch for while using this medicine? Visit your doctor for checks on your progress. This drug may make you feel generally unwell. This is not uncommon, as chemotherapy can affect healthy cells as well as cancer cells. Report any side effects. Continue your course of treatment even though you feel ill unless your doctor tells you to stop. In some cases, you may be given additional medicines to help with side effects. Follow all directions for their use. Call your doctor or health care professional for advice if you get a fever, chills or sore throat, or other symptoms of a cold or flu. Do not treat yourself. This drug decreases your body's ability to fight infections. Try to avoid being around people who are sick. This medicine may increase your risk to bruise or bleed. Call your doctor or health care professional if you notice any unusual bleeding. Be careful brushing and flossing your teeth or using a toothpick because you may get an infection or bleed more easily. If you have any dental work done, tell your dentist you are receiving this medicine. Avoid taking products that contain aspirin, acetaminophen, ibuprofen, naproxen, or ketoprofen unless instructed by your doctor. These medicines may hide a fever. Do not become pregnant while taking this medicine. Women should    inform their doctor if they wish to become pregnant or think they might be pregnant. There is a potential for serious side effects to an unborn child. Talk to your health care professional or pharmacist for more information. Do not breast-feed an infant while taking this medicine. Men should inform their doctor if they wish to father a child. This medicine may lower sperm counts. Do not treat diarrhea with over the counter products. Contact your doctor if you have diarrhea that lasts more than 2 days or if it is severe and watery. This medicine can make you more sensitive to the sun. Keep out of the sun. If you cannot avoid being in the sun, wear protective clothing and use sunscreen. Do not use sun lamps or tanning beds/booths. What side effects may I notice from receiving this medicine? Side effects that you should report to your doctor or health care professional as soon as possible: -allergic reactions like skin rash, itching or hives, swelling of the face, lips, or tongue -low blood counts - this medicine may decrease the number of white blood cells, red blood cells and platelets. You may be at increased risk for infections and bleeding. -signs of infection - fever or chills, cough, sore throat, pain or difficulty passing urine -signs of decreased platelets or bleeding - bruising, pinpoint red spots on the skin, black, tarry stools, blood in the urine -signs of decreased red blood cells - unusually weak or tired, fainting spells, lightheadedness -breathing problems -changes in vision -chest pain -mouth sores -nausea and vomiting -pain, swelling, redness at site where injected -pain, tingling, numbness in the hands or feet -redness, swelling, or sores on hands or feet -stomach pain -unusual bleeding Side effects that usually do not require medical attention (report to your doctor or health care professional if they continue or are bothersome): -changes in finger or toe  nails -diarrhea -dry or itchy skin -hair loss -headache -loss of appetite -sensitivity of eyes to the light -stomach upset -unusually teary eyes This list may not describe all possible side effects. Call your doctor for medical advice about side effects. You may report side effects to FDA at 1-800-FDA-1088. Where should I keep my medicine? This drug is given in a hospital or clinic and will not be stored at home. NOTE: This sheet is a summary. It may not cover all possible information. If you have questions about this medicine, talk to your doctor, pharmacist, or health care provider.  2012, [employer]. (12/11/2007 1:53:16 PM) 

## 2013-01-14 ENCOUNTER — Other Ambulatory Visit (HOSPITAL_BASED_OUTPATIENT_CLINIC_OR_DEPARTMENT_OTHER): Payer: Medicaid Other | Admitting: Lab

## 2013-01-14 ENCOUNTER — Ambulatory Visit (HOSPITAL_BASED_OUTPATIENT_CLINIC_OR_DEPARTMENT_OTHER): Payer: Medicaid Other

## 2013-01-14 ENCOUNTER — Other Ambulatory Visit: Payer: Medicaid Other | Admitting: Lab

## 2013-01-14 ENCOUNTER — Ambulatory Visit (HOSPITAL_BASED_OUTPATIENT_CLINIC_OR_DEPARTMENT_OTHER): Payer: Medicaid Other | Admitting: Hematology & Oncology

## 2013-01-14 ENCOUNTER — Ambulatory Visit: Payer: Medicaid Other

## 2013-01-14 VITALS — BP 107/72 | HR 98 | Temp 98.0°F | Resp 18 | Ht 68.0 in | Wt 206.0 lb

## 2013-01-14 DIAGNOSIS — C779 Secondary and unspecified malignant neoplasm of lymph node, unspecified: Secondary | ICD-10-CM

## 2013-01-14 DIAGNOSIS — Z5111 Encounter for antineoplastic chemotherapy: Secondary | ICD-10-CM

## 2013-01-14 DIAGNOSIS — C182 Malignant neoplasm of ascending colon: Secondary | ICD-10-CM

## 2013-01-14 LAB — CMP (CANCER CENTER ONLY)
Albumin: 3.5 g/dL (ref 3.3–5.5)
Alkaline Phosphatase: 64 U/L (ref 26–84)
CO2: 29 mEq/L (ref 18–33)
Chloride: 105 mEq/L (ref 98–108)
Glucose, Bld: 117 mg/dL (ref 73–118)
Potassium: 3.9 mEq/L (ref 3.3–4.7)
Sodium: 142 mEq/L (ref 128–145)
Total Protein: 7.1 g/dL (ref 6.4–8.1)

## 2013-01-14 LAB — CBC WITH DIFFERENTIAL (CANCER CENTER ONLY)
BASO#: 0 10*3/uL (ref 0.0–0.2)
EOS%: 3.5 % (ref 0.0–7.0)
Eosinophils Absolute: 0.2 10*3/uL (ref 0.0–0.5)
HGB: 13.7 g/dL (ref 13.0–17.1)
LYMPH#: 1.7 10*3/uL (ref 0.9–3.3)
MONO#: 1.1 10*3/uL — ABNORMAL HIGH (ref 0.1–0.9)
NEUT#: 1.8 10*3/uL (ref 1.5–6.5)
RBC: 5.24 10*6/uL (ref 4.20–5.70)
WBC: 4.8 10*3/uL (ref 4.0–10.0)

## 2013-01-14 MED ORDER — SODIUM CHLORIDE 0.9 % IV SOLN
2400.0000 mg/m2 | INTRAVENOUS | Status: DC
  Administered 2013-01-14: 5100 mg via INTRAVENOUS
  Filled 2013-01-14: qty 102

## 2013-01-14 MED ORDER — SODIUM CHLORIDE 0.9 % IV SOLN
Freq: Once | INTRAVENOUS | Status: AC
  Administered 2013-01-14: 11:00:00 via INTRAVENOUS

## 2013-01-14 MED ORDER — DEXTROSE 5 % IV SOLN
400.0000 mg/m2 | Freq: Once | INTRAVENOUS | Status: AC
  Administered 2013-01-14: 848 mg via INTRAVENOUS
  Filled 2013-01-14: qty 42.4

## 2013-01-14 MED ORDER — FLUOROURACIL CHEMO INJECTION 2.5 GM/50ML
400.0000 mg/m2 | Freq: Once | INTRAVENOUS | Status: AC
  Administered 2013-01-14: 850 mg via INTRAVENOUS
  Filled 2013-01-14: qty 17

## 2013-01-14 MED ORDER — DEXTROSE 5 % IV SOLN: Freq: Once | INTRAVENOUS | Status: DC

## 2013-01-14 MED ORDER — HEPARIN SOD (PORK) LOCK FLUSH 100 UNIT/ML IV SOLN
500.0000 [IU] | Freq: Once | INTRAVENOUS | Status: DC | PRN
  Filled 2013-01-14: qty 5

## 2013-01-14 MED ORDER — SODIUM CHLORIDE 0.9 % IJ SOLN
10.0000 mL | INTRAMUSCULAR | Status: DC | PRN
  Filled 2013-01-14: qty 10

## 2013-01-14 MED ORDER — PROCHLORPERAZINE MALEATE 10 MG PO TABS
10.0000 mg | ORAL_TABLET | Freq: Four times a day (QID) | ORAL | Status: DC | PRN
  Administered 2013-01-14: 10 mg via ORAL

## 2013-01-14 NOTE — Patient Instructions (Signed)
Fluorouracil, 5-FU injection What is this medicine? FLUOROURACIL, 5-FU (flure oh YOOR a sil) is a chemotherapy drug. It slows the growth of cancer cells. This medicine is used to treat many types of cancer like breast cancer, colon or rectal cancer, pancreatic cancer, and stomach cancer. This medicine may be used for other purposes; ask your health care provider or pharmacist if you have questions. What should I tell my health care provider before I take this medicine? They need to know if you have any of these conditions: -blood disorders -dihydropyrimidine dehydrogenase (DPD) deficiency -infection (especially a virus infection such as chickenpox, cold sores, or herpes) -kidney disease -liver disease -malnourished, poor nutrition -recent or ongoing radiation therapy -an unusual or allergic reaction to fluorouracil, other chemotherapy, other medicines, foods, dyes, or preservatives -pregnant or trying to get pregnant -breast-feeding How should I use this medicine? This drug is given as an infusion or injection into a vein. It is administered in a hospital or clinic by a specially trained health care professional. Talk to your pediatrician regarding the use of this medicine in children. Special care may be needed. Overdosage: If you think you have taken too much of this medicine contact a poison control center or emergency room at once. NOTE: This medicine is only for you. Do not share this medicine with others. What if I miss a dose? It is important not to miss your dose. Call your doctor or health care professional if you are unable to keep an appointment. What may interact with this medicine? -allopurinol -cimetidine -dapsone -digoxin -hydroxyurea -leucovorin -levamisole -medicines for seizures like ethotoin, fosphenytoin, phenytoin -medicines to increase blood counts like filgrastim, pegfilgrastim, sargramostim -medicines that treat or prevent blood clots like warfarin,  enoxaparin, and dalteparin -methotrexate -metronidazole -pyrimethamine -some other chemotherapy drugs like busulfan, cisplatin, estramustine, vinblastine -trimethoprim -trimetrexate -vaccines Talk to your doctor or health care professional before taking any of these medicines: -acetaminophen -aspirin -ibuprofen -ketoprofen -naproxen This list may not describe all possible interactions. Give your health care provider a list of all the medicines, herbs, non-prescription drugs, or dietary supplements you use. Also tell them if you smoke, drink alcohol, or use illegal drugs. Some items may interact with your medicine. What should I watch for while using this medicine? Visit your doctor for checks on your progress. This drug may make you feel generally unwell. This is not uncommon, as chemotherapy can affect healthy cells as well as cancer cells. Report any side effects. Continue your course of treatment even though you feel ill unless your doctor tells you to stop. In some cases, you may be given additional medicines to help with side effects. Follow all directions for their use. Call your doctor or health care professional for advice if you get a fever, chills or sore throat, or other symptoms of a cold or flu. Do not treat yourself. This drug decreases your body's ability to fight infections. Try to avoid being around people who are sick. This medicine may increase your risk to bruise or bleed. Call your doctor or health care professional if you notice any unusual bleeding. Be careful brushing and flossing your teeth or using a toothpick because you may get an infection or bleed more easily. If you have any dental work done, tell your dentist you are receiving this medicine. Avoid taking products that contain aspirin, acetaminophen, ibuprofen, naproxen, or ketoprofen unless instructed by your doctor. These medicines may hide a fever. Do not become pregnant while taking this medicine. Women should    inform their doctor if they wish to become pregnant or think they might be pregnant. There is a potential for serious side effects to an unborn child. Talk to your health care professional or pharmacist for more information. Do not breast-feed an infant while taking this medicine. Men should inform their doctor if they wish to father a child. This medicine may lower sperm counts. Do not treat diarrhea with over the counter products. Contact your doctor if you have diarrhea that lasts more than 2 days or if it is severe and watery. This medicine can make you more sensitive to the sun. Keep out of the sun. If you cannot avoid being in the sun, wear protective clothing and use sunscreen. Do not use sun lamps or tanning beds/booths. What side effects may I notice from receiving this medicine? Side effects that you should report to your doctor or health care professional as soon as possible: -allergic reactions like skin rash, itching or hives, swelling of the face, lips, or tongue -low blood counts - this medicine may decrease the number of white blood cells, red blood cells and platelets. You may be at increased risk for infections and bleeding. -signs of infection - fever or chills, cough, sore throat, pain or difficulty passing urine -signs of decreased platelets or bleeding - bruising, pinpoint red spots on the skin, black, tarry stools, blood in the urine -signs of decreased red blood cells - unusually weak or tired, fainting spells, lightheadedness -breathing problems -changes in vision -chest pain -mouth sores -nausea and vomiting -pain, swelling, redness at site where injected -pain, tingling, numbness in the hands or feet -redness, swelling, or sores on hands or feet -stomach pain -unusual bleeding Side effects that usually do not require medical attention (report to your doctor or health care professional if they continue or are bothersome): -changes in finger or toe  nails -diarrhea -dry or itchy skin -hair loss -headache -loss of appetite -sensitivity of eyes to the light -stomach upset -unusually teary eyes This list may not describe all possible side effects. Call your doctor for medical advice about side effects. You may report side effects to FDA at 1-800-FDA-1088. Where should I keep my medicine? This drug is given in a hospital or clinic and will not be stored at home. NOTE: This sheet is a summary. It may not cover all possible information. If you have questions about this medicine, talk to your doctor, pharmacist, or health care provider.  2012, [employer]. (12/11/2007 1:53:16 PM) 

## 2013-01-14 NOTE — Progress Notes (Signed)
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  INTERIM HISTORY:  Mr. Tagliaferro comes in for his followup.  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  He is able to walk better.  He is able to do more activities.  He has had some diarrhea.  He has had a couple mouth sores.  Overall, his performance status is excellent at ECOG 0-1.  We are measuring his CEA levels.  Last CEA level was 1.8 back in April.  His appetite has been okay.  He has had just a few bouts of diarrhea. He has had no rashes.  He has had no cough or shortness breath.  PHYSICAL EXAMINATION:  General:  This is a well-developed, well- nourished black gentleman in no obvious distress.  Vital signs: Temperature of 98, pulse 98, respiratory rate 18, blood pressure 107/72. Weight is 206.  Head and neck:  Normocephalic, atraumatic skull.  There are no ocular or oral lesions.  There are no palpable cervical or supraclavicular lymph nodes.  Lungs:  Clear bilaterally.  Cardiac: Regular rate and rhythm with normal S1, S2.  There are no murmurs, rubs or bruits.  Abdomen:  Soft with good bowel sounds.  There is no palpable abdominal mass.  There is no fluid wave.  There is no palpable hepatosplenomegaly.  He has well healed laparotomy scar.  Extremities: Show no clubbing, cyanosis or edema.  Neurological:  Shows no focal neurological deficits.  LABORATORY STUDIES:  White cell count is 4.8, hemoglobin 13.7, hematocrit 42.3, platelet count 160.  IMPRESSION:  Mr. Whitefield is a 42 year old gentleman with stage IIIC adenocarcinoma of the ascending colon.  He had resection.  He had 6 lymph nodes positive.  His tumor is wild type for KRAS.  Again, we will hold his oxaliplatin at this time.  I will give, probably his final dose, of chemo in 2 weeks.  I will see Mr. Boer back in 2  weeks.    ______________________________ Josph Macho, M.D. PRE/MEDQ  D:  01/14/2013  T:  01/14/2013  Job:  [id number]

## 2013-01-14 NOTE — Progress Notes (Signed)
This office note has been dictated.

## 2013-01-16 ENCOUNTER — Ambulatory Visit (HOSPITAL_BASED_OUTPATIENT_CLINIC_OR_DEPARTMENT_OTHER): Payer: Medicaid Other

## 2013-01-16 VITALS — BP 133/92 | HR 101 | Temp 97.1°F | Resp 20

## 2013-01-16 DIAGNOSIS — C779 Secondary and unspecified malignant neoplasm of lymph node, unspecified: Secondary | ICD-10-CM

## 2013-01-16 DIAGNOSIS — C182 Malignant neoplasm of ascending colon: Secondary | ICD-10-CM

## 2013-01-16 DIAGNOSIS — Z452 Encounter for adjustment and management of vascular access device: Secondary | ICD-10-CM

## 2013-01-16 MED ORDER — SODIUM CHLORIDE 0.9 % IJ SOLN
10.0000 mL | INTRAMUSCULAR | Status: DC | PRN
  Administered 2013-01-16: 10 mL
  Filled 2013-01-16: qty 10

## 2013-01-16 MED ORDER — HEPARIN SOD (PORK) LOCK FLUSH 100 UNIT/ML IV SOLN
500.0000 [IU] | Freq: Once | INTRAVENOUS | Status: AC | PRN
  Administered 2013-01-16: 500 [IU]
  Filled 2013-01-16: qty 5

## 2013-01-16 NOTE — Patient Instructions (Signed)
Fluorouracil, 5-FU injection What is this medicine? FLUOROURACIL, 5-FU (flure oh YOOR a sil) is a chemotherapy drug. It slows the growth of cancer cells. This medicine is used to treat many types of cancer like breast cancer, colon or rectal cancer, pancreatic cancer, and stomach cancer. This medicine may be used for other purposes; ask your health care provider or pharmacist if you have questions. What should I tell my health care provider before I take this medicine? They need to know if you have any of these conditions: -blood disorders -dihydropyrimidine dehydrogenase (DPD) deficiency -infection (especially a virus infection such as chickenpox, cold sores, or herpes) -kidney disease -liver disease -malnourished, poor nutrition -recent or ongoing radiation therapy -an unusual or allergic reaction to fluorouracil, other chemotherapy, other medicines, foods, dyes, or preservatives -pregnant or trying to get pregnant -breast-feeding How should I use this medicine? This drug is given as an infusion or injection into a vein. It is administered in a hospital or clinic by a specially trained health care professional. Talk to your pediatrician regarding the use of this medicine in children. Special care may be needed. Overdosage: If you think you have taken too much of this medicine contact a poison control center or emergency room at once. NOTE: This medicine is only for you. Do not share this medicine with others. What if I miss a dose? It is important not to miss your dose. Call your doctor or health care professional if you are unable to keep an appointment. What may interact with this medicine? -allopurinol -cimetidine -dapsone -digoxin -hydroxyurea -leucovorin -levamisole -medicines for seizures like ethotoin, fosphenytoin, phenytoin -medicines to increase blood counts like filgrastim, pegfilgrastim, sargramostim -medicines that treat or prevent blood clots like warfarin,  enoxaparin, and dalteparin -methotrexate -metronidazole -pyrimethamine -some other chemotherapy drugs like busulfan, cisplatin, estramustine, vinblastine -trimethoprim -trimetrexate -vaccines Talk to your doctor or health care professional before taking any of these medicines: -acetaminophen -aspirin -ibuprofen -ketoprofen -naproxen This list may not describe all possible interactions. Give your health care provider a list of all the medicines, herbs, non-prescription drugs, or dietary supplements you use. Also tell them if you smoke, drink alcohol, or use illegal drugs. Some items may interact with your medicine. What should I watch for while using this medicine? Visit your doctor for checks on your progress. This drug may make you feel generally unwell. This is not uncommon, as chemotherapy can affect healthy cells as well as cancer cells. Report any side effects. Continue your course of treatment even though you feel ill unless your doctor tells you to stop. In some cases, you may be given additional medicines to help with side effects. Follow all directions for their use. Call your doctor or health care professional for advice if you get a fever, chills or sore throat, or other symptoms of a cold or flu. Do not treat yourself. This drug decreases your body's ability to fight infections. Try to avoid being around people who are sick. This medicine may increase your risk to bruise or bleed. Call your doctor or health care professional if you notice any unusual bleeding. Be careful brushing and flossing your teeth or using a toothpick because you may get an infection or bleed more easily. If you have any dental work done, tell your dentist you are receiving this medicine. Avoid taking products that contain aspirin, acetaminophen, ibuprofen, naproxen, or ketoprofen unless instructed by your doctor. These medicines may hide a fever. Do not become pregnant while taking this medicine. Women should    inform their doctor if they wish to become pregnant or think they might be pregnant. There is a potential for serious side effects to an unborn child. Talk to your health care professional or pharmacist for more information. Do not breast-feed an infant while taking this medicine. Men should inform their doctor if they wish to father a child. This medicine may lower sperm counts. Do not treat diarrhea with over the counter products. Contact your doctor if you have diarrhea that lasts more than 2 days or if it is severe and watery. This medicine can make you more sensitive to the sun. Keep out of the sun. If you cannot avoid being in the sun, wear protective clothing and use sunscreen. Do not use sun lamps or tanning beds/booths. What side effects may I notice from receiving this medicine? Side effects that you should report to your doctor or health care professional as soon as possible: -allergic reactions like skin rash, itching or hives, swelling of the face, lips, or tongue -low blood counts - this medicine may decrease the number of white blood cells, red blood cells and platelets. You may be at increased risk for infections and bleeding. -signs of infection - fever or chills, cough, sore throat, pain or difficulty passing urine -signs of decreased platelets or bleeding - bruising, pinpoint red spots on the skin, black, tarry stools, blood in the urine -signs of decreased red blood cells - unusually weak or tired, fainting spells, lightheadedness -breathing problems -changes in vision -chest pain -mouth sores -nausea and vomiting -pain, swelling, redness at site where injected -pain, tingling, numbness in the hands or feet -redness, swelling, or sores on hands or feet -stomach pain -unusual bleeding Side effects that usually do not require medical attention (report to your doctor or health care professional if they continue or are bothersome): -changes in finger or toe  nails -diarrhea -dry or itchy skin -hair loss -headache -loss of appetite -sensitivity of eyes to the light -stomach upset -unusually teary eyes This list may not describe all possible side effects. Call your doctor for medical advice about side effects. You may report side effects to FDA at 1-800-FDA-1088. Where should I keep my medicine? This drug is given in a hospital or clinic and will not be stored at home. NOTE: This sheet is a summary. It may not cover all possible information. If you have questions about this medicine, talk to your doctor, pharmacist, or health care provider.  2012, [employer]. (12/11/2007 1:53:16 PM)

## 2013-01-28 ENCOUNTER — Other Ambulatory Visit: Payer: Medicaid Other | Admitting: Lab

## 2013-01-28 ENCOUNTER — Ambulatory Visit: Payer: Medicaid Other | Admitting: Hematology & Oncology

## 2013-01-28 ENCOUNTER — Ambulatory Visit: Payer: Medicaid Other

## 2013-02-03 ENCOUNTER — Other Ambulatory Visit (HOSPITAL_BASED_OUTPATIENT_CLINIC_OR_DEPARTMENT_OTHER): Payer: Medicaid Other | Admitting: Lab

## 2013-02-03 ENCOUNTER — Ambulatory Visit (HOSPITAL_BASED_OUTPATIENT_CLINIC_OR_DEPARTMENT_OTHER): Payer: Medicaid Other

## 2013-02-03 ENCOUNTER — Ambulatory Visit (HOSPITAL_BASED_OUTPATIENT_CLINIC_OR_DEPARTMENT_OTHER): Payer: Medicaid Other | Admitting: Hematology & Oncology

## 2013-02-03 VITALS — BP 123/80 | HR 107 | Temp 98.0°F | Resp 18 | Ht 68.0 in | Wt 206.0 lb

## 2013-02-03 DIAGNOSIS — C182 Malignant neoplasm of ascending colon: Secondary | ICD-10-CM

## 2013-02-03 DIAGNOSIS — C779 Secondary and unspecified malignant neoplasm of lymph node, unspecified: Secondary | ICD-10-CM

## 2013-02-03 DIAGNOSIS — Z5111 Encounter for antineoplastic chemotherapy: Secondary | ICD-10-CM

## 2013-02-03 LAB — CMP (CANCER CENTER ONLY)
ALT(SGPT): 30 U/L (ref 10–47)
AST: 24 U/L (ref 11–38)
Albumin: 3.5 g/dL (ref 3.3–5.5)
Calcium: 10.1 mg/dL (ref 8.0–10.3)
Chloride: 104 mEq/L (ref 98–108)
Potassium: 4.6 mEq/L (ref 3.3–4.7)

## 2013-02-03 LAB — CBC WITH DIFFERENTIAL (CANCER CENTER ONLY)
BASO#: 0 10*3/uL (ref 0.0–0.2)
Eosinophils Absolute: 0.1 10*3/uL (ref 0.0–0.5)
HGB: 14.4 g/dL (ref 13.0–17.1)
MCH: 27.2 pg — ABNORMAL LOW (ref 28.0–33.4)
MONO#: 1.1 10*3/uL — ABNORMAL HIGH (ref 0.1–0.9)
MONO%: 21.6 % — ABNORMAL HIGH (ref 0.0–13.0)
NEUT#: 2.2 10*3/uL (ref 1.5–6.5)
RBC: 5.3 10*6/uL (ref 4.20–5.70)

## 2013-02-03 MED ORDER — SODIUM CHLORIDE 0.9 % IV SOLN
2400.0000 mg/m2 | INTRAVENOUS | Status: DC
  Administered 2013-02-03: 5100 mg via INTRAVENOUS
  Filled 2013-02-03: qty 102

## 2013-02-03 MED ORDER — LEUCOVORIN CALCIUM INJECTION 350 MG
400.0000 mg/m2 | Freq: Once | INTRAVENOUS | Status: AC
  Administered 2013-02-03: 848 mg via INTRAVENOUS
  Filled 2013-02-03: qty 42.4

## 2013-02-03 MED ORDER — FLUOROURACIL CHEMO INJECTION 2.5 GM/50ML
400.0000 mg/m2 | Freq: Once | INTRAVENOUS | Status: AC
  Administered 2013-02-03: 850 mg via INTRAVENOUS
  Filled 2013-02-03: qty 17

## 2013-02-03 MED ORDER — OXALIPLATIN CHEMO INJECTION 100 MG/20ML
63.7500 mg/m2 | Freq: Once | INTRAVENOUS | Status: AC
  Administered 2013-02-03: 135 mg via INTRAVENOUS
  Filled 2013-02-03: qty 27

## 2013-02-03 MED ORDER — DEXTROSE 5 % IV SOLN
Freq: Once | INTRAVENOUS | Status: AC
  Administered 2013-02-03: 09:00:00 via INTRAVENOUS

## 2013-02-03 MED ORDER — PALONOSETRON HCL INJECTION 0.25 MG/5ML
0.2500 mg | Freq: Once | INTRAVENOUS | Status: AC
  Administered 2013-02-03: 0.25 mg via INTRAVENOUS

## 2013-02-03 MED ORDER — DEXAMETHASONE SODIUM PHOSPHATE 10 MG/ML IJ SOLN
10.0000 mg | Freq: Once | INTRAMUSCULAR | Status: AC
  Administered 2013-02-03: 10 mg via INTRAVENOUS

## 2013-02-03 NOTE — Progress Notes (Signed)
This office note has been dictated.

## 2013-02-03 NOTE — Patient Instructions (Signed)
Dexamethasone injection What is this medicine? DEXAMETHASONE (dex a METH a sone) is a corticosteroid. It is used to treat inflammation of the skin, joints, lungs, and other organs. Common conditions treated include asthma, allergies, and arthritis. It is also used for other conditions, like blood disorders and diseases of the adrenal glands. This medicine may be used for other purposes; ask your health care provider or pharmacist if you have questions. What should I tell my health care provider before I take this medicine? They need to know if you have any of these conditions: -blood clotting problems -Cushing's syndrome -diabetes -glaucoma -heart problems or disease -high blood pressure -infection like herpes, measles, tuberculosis, or chickenpox -kidney disease -liver disease -mental problems -myasthenia gravis -osteoporosis -previous heart attack -seizures -stomach, ulcer or intestine disease including colitis and diverticulitis -thyroid problem -an unusual or allergic reaction to dexamethasone, corticosteroids, other medicines, lactose, foods, dyes, or preservatives -pregnant or trying to get pregnant -breast-feeding How should I use this medicine? This medicine is for injection into a muscle, joint, lesion, soft tissue, or vein. It is given by a health care professional in a hospital or clinic setting. Talk to your pediatrician regarding the use of this medicine in children. Special care may be needed. Overdosage: If you think you have taken too much of this medicine contact a poison control center or emergency room at once. NOTE: This medicine is only for you. Do not share this medicine with others. What if I miss a dose? This may not apply. If you are having a series of injections over a prolonged period, try not to miss an appointment. Call your doctor or health care professional to reschedule if you are unable to keep an appointment. What may interact with this medicine? Do  not take this medicine with any of the following medications: -mifepristone, RU-486 -vaccines This medicine may also interact with the following medications: -amphotericin B -antibiotics like clarithromycin, erythromycin, and troleandomycin -aspirin and aspirin-like drugs -barbiturates like phenobarbital -carbamazepine -cholestyramine -cholinesterase inhibitors like donepezil, galantamine, rivastigmine, and tacrine -cyclosporine -digoxin -diuretics -ephedrine -male hormones, like estrogens or progestins and birth control pills -indinavir -isoniazid -ketoconazole -medicines for diabetes -medicines that improve muscle tone or strength for conditions like myasthenia gravis -NSAIDs, medicines for pain and inflammation, like ibuprofen or naproxen -phenytoin -rifampin -thalidomide -warfarin This list may not describe all possible interactions. Give your health care provider a list of all the medicines, herbs, non-prescription drugs, or dietary supplements you use. Also tell them if you smoke, drink alcohol, or use illegal drugs. Some items may interact with your medicine. What should I watch for while using this medicine? Your condition will be monitored carefully while you are receiving this medicine. If you are taking this medicine for a long time, carry an identification card with your name and address, the type and dose of your medicine, and your doctor's name and address. This medicine may increase your risk of getting an infection. Stay away from people who are sick. Tell your doctor or health care professional if you are around anyone with measles or chickenpox. Talk to your health care provider before you get any vaccines that you take this medicine. If you are going to have surgery, tell your doctor or health care professional that you have taken this medicine within the last twelve months. Ask your doctor or health care professional about your diet. You may need to lower the  amount of salt you eat. The medicine can increase your blood sugar. If you  are a diabetic check with your doctor if you need help adjusting the dose of your diabetic medicine. What side effects may I notice from receiving this medicine? Side effects that you should report to your doctor or health care professional as soon as possible: -allergic reactions like skin rash, itching or hives, swelling of the face, lips, or tongue -black or tarry stools -change in the amount of urine -changes in vision -confusion, excitement, restlessness, a false sense of well-being -fever, sore throat, sneezing, cough, or other signs of infection, wounds that will not heal -hallucinations -increased thirst -mental depression, mood swings, mistaken feelings of self importance or of being mistreated -pain in hips, back, ribs, arms, shoulders, or legs -pain, redness, or irritation at the injection site -redness, blistering, peeling or loosening of the skin, including inside the mouth -rounding out of face -swelling of feet or lower legs -unusual bleeding or bruising -unusual tired or weak -wounds that do not heal Side effects that usually do not require medical attention (report to your doctor or health care professional if they continue or are bothersome): -diarrhea or constipation -change in taste -headache -nausea, vomiting -skin problems, acne, thin and shiny skin -touble sleeping -unusual growth of hair on the face or body -weight gain This list may not describe all possible side effects. Call your doctor for medical advice about side effects. You may report side effects to FDA at 1-800-FDA-1088. Where should I keep my medicine? This drug is given in a hospital or clinic and will not be stored at home. NOTE: This sheet is a summary. It may not cover all possible information. If you have questions about this medicine, talk to your doctor, pharmacist, or health care provider.  2012, Elsevier/Gold  Standard. (11/28/2007 2:04:12 PM)  Palonosetron Injection What is this medicine? PALONOSETRON (pal oh NOE se tron) is used to prevent nausea and vomiting caused by chemotherapy. It also helps prevent delayed nausea and vomiting that may occur a few days after your treatment. This medicine may be used for other purposes; ask your health care provider or pharmacist if you have questions. What should I tell my health care provider before I take this medicine? They need to know if you have any of these conditions: -irregular heart rhythm -an unusual or allergic reaction to palonosetron, dolasetron, granisetron, ondansetron, other medicines, foods, dyes, or preservatives -pregnant or trying to get pregnant -breast-feeding How should I use this medicine? This medicine is for infusion into a vein. It is given by a health care professional in a hospital or clinic setting. Talk to your pediatrician regarding the use of this medicine in children. Special care may be needed. Overdosage: If you think you have taken too much of this medicine contact a poison control center or emergency room at once. NOTE: This medicine is only for you. Do not share this medicine with others. What if I miss a dose? This does not apply. What may interact with this medicine? Do not take this medicine with any of the following medications: -cisapride -droperidol -ziprasidone This list may not describe all possible interactions. Give your health care provider a list of all the medicines, herbs, non-prescription drugs, or dietary supplements you use. Also tell them if you smoke, drink alcohol, or use illegal drugs. Some items may interact with your medicine. What should I watch for while using this medicine? Your condition will be monitored carefully while you are receiving this medicine. What side effects may I notice from receiving this medicine? Side  effects that you should report to your doctor or health care professional  as soon as possible: -allergic reactions like skin rash, itching or hives, swelling of the face, lips, or tongue -breathing problems -fast or irregular heartbeat -fever and chills -swelling of the hands and feet -tightness in the chest Side effects that usually do not require medical attention (report to your doctor or health care professional if they continue or are bothersome): -constipation or diarrhea -dizziness -headache This list may not describe all possible side effects. Call your doctor for medical advice about side effects. You may report side effects to FDA at 1-800-FDA-1088. Where should I keep my medicine? This drug is given in a hospital or clinic and will not be stored at home. NOTE: This sheet is a summary. It may not cover all possible information. If you have questions about this medicine, talk to your doctor, pharmacist, or health care provider.  2013, Elsevier/Gold Standard. (03/10/2008 1:15:39 PM)  Leucovorin injection What is this medicine? LEUCOVORIN (loo koe VOR in) is used to prevent or treat the harmful effects of some medicines. This medicine is used to treat anemia caused by a low amount of folic acid in the body. It is also used with 5-fluorouracil (5-FU) to treat colon cancer. This medicine may be used for other purposes; ask your health care provider or pharmacist if you have questions. What should I tell my health care provider before I take this medicine? They need to know if you have any of these conditions: -anemia from low levels of vitamin B-12 in the blood -an unusual or allergic reaction to leucovorin, folic acid, other medicines, foods, dyes, or preservatives -pregnant or trying to get pregnant -breast-feeding How should I use this medicine? This medicine is for injection into a muscle or into a vein. It is given by a health care professional in a hospital or clinic setting. Talk to your pediatrician regarding the use of this medicine in children.  Special care may be needed. Overdosage: If you think you have taken too much of this medicine contact a poison control center or emergency room at once. NOTE: This medicine is only for you. Do not share this medicine with others. What if I miss a dose? This does not apply. What may interact with this medicine? -capecitabine -fluorouracil -phenobarbital -phenytoin -primidone -trimethoprim-sulfamethoxazole This list may not describe all possible interactions. Give your health care provider a list of all the medicines, herbs, non-prescription drugs, or dietary supplements you use. Also tell them if you smoke, drink alcohol, or use illegal drugs. Some items may interact with your medicine. What should I watch for while using this medicine? Your condition will be monitored carefully while you are receiving this medicine. This medicine may increase the side effects of 5-fluorouracil, 5-FU. Tell your doctor or health care professional if you have diarrhea or mouth sores that do not get better or that get worse. What side effects may I notice from receiving this medicine? Side effects that you should report to your doctor or health care professional as soon as possible: -allergic reactions like skin rash, itching or hives, swelling of the face, lips, or tongue -breathing problems -fever, infection -mouth sores -unusual bleeding or bruising -unusually weak or tired Side effects that usually do not require medical attention (report to your doctor or health care professional if they continue or are bothersome): -constipation or diarrhea -loss of appetite -nausea, vomiting This list may not describe all possible side effects. Call your doctor for  medical advice about side effects. You may report side effects to FDA at 1-800-FDA-1088. Where should I keep my medicine? This drug is given in a hospital or clinic and will not be stored at home. NOTE: This sheet is a summary. It may not cover all  possible information. If you have questions about this medicine, talk to your doctor, pharmacist, or health care provider.  2012, Elsevier/Gold Standard. (02/11/2008 4:50:29 PM) Oxaliplatin Injection What is this medicine? OXALIPLATIN (ox AL i PLA tin) is a chemotherapy drug. It targets fast dividing cells, like cancer cells, and causes these cells to die. This medicine is used to treat cancers of the colon and rectum, and many other cancers. This medicine may be used for other purposes; ask your health care provider or pharmacist if you have questions. What should I tell my health care provider before I take this medicine? They need to know if you have any of these conditions: -kidney disease -an unusual or allergic reaction to oxaliplatin, other chemotherapy, other medicines, foods, dyes, or preservatives -pregnant or trying to get pregnant -breast-feeding How should I use this medicine? This drug is given as an infusion into a vein. It is administered in a hospital or clinic by a specially trained health care professional. Talk to your pediatrician regarding the use of this medicine in children. Special care may be needed. Overdosage: If you think you have taken too much of this medicine contact a poison control center or emergency room at once. NOTE: This medicine is only for you. Do not share this medicine with others. What if I miss a dose? It is important not to miss a dose. Call your doctor or health care professional if you are unable to keep an appointment. What may interact with this medicine? -medicines to increase blood counts like filgrastim, pegfilgrastim, sargramostim -probenecid -some antibiotics like amikacin, gentamicin, neomycin, polymyxin B, streptomycin, tobramycin -zalcitabine Talk to your doctor or health care professional before taking any of these medicines: -acetaminophen -aspirin -ibuprofen -ketoprofen -naproxen This list may not describe all possible  interactions. Give your health care provider a list of all the medicines, herbs, non-prescription drugs, or dietary supplements you use. Also tell them if you smoke, drink alcohol, or use illegal drugs. Some items may interact with your medicine. What should I watch for while using this medicine? Your condition will be monitored carefully while you are receiving this medicine. You will need important blood work done while you are taking this medicine. This medicine can make you more sensitive to cold. Do not drink cold drinks or use ice. Cover exposed skin before coming in contact with cold temperatures or cold objects. When out in cold weather wear warm clothing and cover your mouth and nose to warm the air that goes into your lungs. Tell your doctor if you get sensitive to the cold. This drug may make you feel generally unwell. This is not uncommon, as chemotherapy can affect healthy cells as well as cancer cells. Report any side effects. Continue your course of treatment even though you feel ill unless your doctor tells you to stop. In some cases, you may be given additional medicines to help with side effects. Follow all directions for their use. Call your doctor or health care professional for advice if you get a fever, chills or sore throat, or other symptoms of a cold or flu. Do not treat yourself. This drug decreases your body's ability to fight infections. Try to avoid being around people who are  sick. This medicine may increase your risk to bruise or bleed. Call your doctor or health care professional if you notice any unusual bleeding. Be careful brushing and flossing your teeth or using a toothpick because you may get an infection or bleed more easily. If you have any dental work done, tell your dentist you are receiving this medicine. Avoid taking products that contain aspirin, acetaminophen, ibuprofen, naproxen, or ketoprofen unless instructed by your doctor. These medicines may hide a  fever. Do not become pregnant while taking this medicine. Women should inform their doctor if they wish to become pregnant or think they might be pregnant. There is a potential for serious side effects to an unborn child. Talk to your health care professional or pharmacist for more information. Do not breast-feed an infant while taking this medicine. Call your doctor or health care professional if you get diarrhea. Do not treat yourself. What side effects may I notice from receiving this medicine? Side effects that you should report to your doctor or health care professional as soon as possible: -allergic reactions like skin rash, itching or hives, swelling of the face, lips, or tongue -low blood counts - This drug may decrease the number of white blood cells, red blood cells and platelets. You may be at increased risk for infections and bleeding. -signs of infection - fever or chills, cough, sore throat, pain or difficulty passing urine -signs of decreased platelets or bleeding - bruising, pinpoint red spots on the skin, black, tarry stools, nosebleeds -signs of decreased red blood cells - unusually weak or tired, fainting spells, lightheadedness -breathing problems -chest pain, pressure -cough -diarrhea -jaw tightness -mouth sores -nausea and vomiting -pain, swelling, redness or irritation at the injection site -pain, tingling, numbness in the hands or feet -problems with balance, talking, walking -redness, blistering, peeling or loosening of the skin, including inside the mouth -trouble passing urine or change in the amount of urine Side effects that usually do not require medical attention (report to your doctor or health care professional if they continue or are bothersome): -changes in vision -constipation -hair loss -loss of appetite -metallic taste in the mouth or changes in taste -stomach pain This list may not describe all possible side effects. Call your doctor for medical  advice about side effects. You may report side effects to FDA at 1-800-FDA-1088. Where should I keep my medicine? This drug is given in a hospital or clinic and will not be stored at home. NOTE: This sheet is a summary. It may not cover all possible information. If you have questions about this medicine, talk to your doctor, pharmacist, or health care provider.  2012, Elsevier/Gold Standard. (03/03/2008 5:22:47 PM)

## 2013-02-04 NOTE — Progress Notes (Signed)
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  CURRENT THERAPY:  The patient to complete his 12 cycles of FOLFOX.  INTERIM HISTORY:  Mr. Peral comes in for follow up.  His wife had the baby last week.  She did not have twins.  The other "baby" was a tumor. It was not malignant.  She does not need any therapy.  The baby is doing well.  He weighed 7.2 pounds.  Ms Newel Oien doing okay.  He has a little bit of a tough time with treatment. 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  This is improving.  Will go ahead and give oxaliplatin today.  I held it for the last 3 cycles.  He is feeling fairly well.  His appetite is okay.  He had no nausea or vomiting.  He has had no abdominal pain.  There has been no bony pain. He has had no leg swelling.  His last CEA was 2.4 back in late May.  PHYSICAL EXAMINATION:  General:  This is a well-developed, well- nourished, African American gentleman in no obvious distress.  Vital signs:  Temperature  98, pulse 107, respiratory rate 18, blood pressure 123/80.  Weight is 206.  Head and neck:  Normocephalic, atraumatic skull.  There are no ocular or oral lesions.  There are no palpable cervical or supraclavicular lymph nodes.  Lungs:  Clear bilaterally. Cardiac:  Regular.  Regular rate and rhythm with a normal S1, S2.  There are no murmurs, rubs or bruits.  Abdomen:  Soft with good bowel sounds. He has well healed laparotomy scar.  There is no fluid wave.  There is no guarding, rebound tenderness.  There is no palpable hepatosplenomegaly.  Extremities:  Show no clubbing, cyanosis or edema. Neurological:  Shows no focal neurological deficits.  LABORATORY STUDIES:  White cell count is 5.3, hemoglobin 14.4, hematocrit 43.8, platelet count is 210.  IMPRESSION:  Mr. Deckard is a 42 year old gentleman with stage IIIC adenocarcinoma of the ascending colon.  He had this resected.  He had 6 lymph nodes that were positive.  His tumor  was __________ while type.  We will go ahead with the last cycle of chemo.  Will then plan for a CT scan to be done in about a month or so.  I will see him back afterwards.    ______________________________ Josph Macho, M.D. PRE/MEDQ  D:  02/03/2013  T:  02/03/2013  Job:  [id number]

## 2013-02-04 NOTE — Letter (Signed)
February 03, 2013    To Whom It May Concern  NAME:  David, Conrad MRN:  [id number] DOB:  Oct 17, 1970  Dear Sir/Madam:  Please be advised that [name] is a patient at the [facility] in [place], [place].  He has locally advanced colon cancer.  He is undergoing chemotherapy.  He received his last dose of chemotherapy on 02/03/2013.  He has suffered from side effects of chemotherapy.  He is really not able to work.  He is on medications to try to help with the side effects.  He has had nausea and vomiting.  His appetite has been poor. He has had nerve damage from the chemotherapy.  [name] has low stamina right now.  He gets fatigued quite easily.  I suspect that [name] will have chemotherapy side effects probably for the next 6 to 8 months.  If any further medical information is needed regarding [name], please feel free to let me know at [phone].  Respectfully,    David Conrad, M.D.  PRE/MEDQ  D:  02/03/2013  T:  02/03/2013  Job:  [id number]

## 2013-02-05 ENCOUNTER — Other Ambulatory Visit: Payer: Self-pay | Admitting: *Deleted

## 2013-02-05 ENCOUNTER — Encounter: Payer: Self-pay | Admitting: *Deleted

## 2013-02-05 ENCOUNTER — Ambulatory Visit (HOSPITAL_BASED_OUTPATIENT_CLINIC_OR_DEPARTMENT_OTHER): Payer: Medicaid Other

## 2013-02-05 VITALS — BP 118/75 | HR 86 | Temp 97.7°F | Resp 18

## 2013-02-05 DIAGNOSIS — C182 Malignant neoplasm of ascending colon: Secondary | ICD-10-CM

## 2013-02-05 DIAGNOSIS — Z452 Encounter for adjustment and management of vascular access device: Secondary | ICD-10-CM

## 2013-02-05 MED ORDER — SODIUM CHLORIDE 0.9 % IJ SOLN
10.0000 mL | INTRAMUSCULAR | Status: DC | PRN
  Administered 2013-02-05: 10 mL
  Filled 2013-02-05: qty 10

## 2013-02-05 MED ORDER — HEPARIN SOD (PORK) LOCK FLUSH 100 UNIT/ML IV SOLN
500.0000 [IU] | Freq: Once | INTRAVENOUS | Status: AC | PRN
  Administered 2013-02-05: 500 [IU]
  Filled 2013-02-05: qty 5

## 2013-02-05 MED ORDER — LIDOCAINE-PRILOCAINE 2.5-2.5 % EX CREA: TOPICAL_CREAM | CUTANEOUS | Status: DC | PRN

## 2013-03-18 ENCOUNTER — Ambulatory Visit (HOSPITAL_BASED_OUTPATIENT_CLINIC_OR_DEPARTMENT_OTHER): Payer: Medicaid Other

## 2013-03-18 ENCOUNTER — Ambulatory Visit (HOSPITAL_BASED_OUTPATIENT_CLINIC_OR_DEPARTMENT_OTHER)
Admission: RE | Admit: 2013-03-18 | Discharge: 2013-03-18 | Disposition: A | Discharge status: Home / Self Care | Payer: Medicaid Other | Source: Ambulatory Visit | Attending: Hematology & Oncology | Admitting: Hematology & Oncology

## 2013-03-18 DIAGNOSIS — Z9049 Acquired absence of other specified parts of digestive tract: Secondary | ICD-10-CM | POA: Insufficient documentation

## 2013-03-18 DIAGNOSIS — C182 Malignant neoplasm of ascending colon: Secondary | ICD-10-CM

## 2013-03-18 DIAGNOSIS — Z9221 Personal history of antineoplastic chemotherapy: Secondary | ICD-10-CM | POA: Insufficient documentation

## 2013-03-18 DIAGNOSIS — C189 Malignant neoplasm of colon, unspecified: Secondary | ICD-10-CM | POA: Insufficient documentation

## 2013-03-18 DIAGNOSIS — K7689 Other specified diseases of liver: Secondary | ICD-10-CM | POA: Insufficient documentation

## 2013-03-18 MED ORDER — IOHEXOL 300 MG/ML  SOLN: 50.0000 mL | Freq: Once | INTRAMUSCULAR | Status: AC | PRN

## 2013-03-18 MED ORDER — IOHEXOL 300 MG/ML  SOLN
100.0000 mL | Freq: Once | INTRAMUSCULAR | Status: AC | PRN
  Administered 2013-03-18: 100 mL via INTRAVENOUS

## 2013-04-01 ENCOUNTER — Telehealth: Payer: Self-pay | Admitting: Hematology & Oncology

## 2013-04-01 ENCOUNTER — Other Ambulatory Visit (HOSPITAL_BASED_OUTPATIENT_CLINIC_OR_DEPARTMENT_OTHER): Payer: Medicaid Other | Admitting: Lab

## 2013-04-01 ENCOUNTER — Ambulatory Visit (HOSPITAL_BASED_OUTPATIENT_CLINIC_OR_DEPARTMENT_OTHER): Payer: Medicaid Other | Admitting: Hematology & Oncology

## 2013-04-01 VITALS — BP 113/80 | HR 96 | Temp 97.8°F | Resp 16 | Ht 68.0 in | Wt 226.0 lb

## 2013-04-01 DIAGNOSIS — C182 Malignant neoplasm of ascending colon: Secondary | ICD-10-CM

## 2013-04-01 DIAGNOSIS — R531 Weakness: Secondary | ICD-10-CM

## 2013-04-01 DIAGNOSIS — G620 Drug-induced polyneuropathy: Secondary | ICD-10-CM

## 2013-04-01 LAB — COMPREHENSIVE METABOLIC PANEL
CO2: 24 mEq/L (ref 19–32)
Glucose, Bld: 113 mg/dL — ABNORMAL HIGH (ref 70–99)
Sodium: 140 mEq/L (ref 135–145)
Total Bilirubin: 0.3 mg/dL (ref 0.3–1.2)
Total Protein: 6.7 g/dL (ref 6.0–8.3)

## 2013-04-01 LAB — CBC WITH DIFFERENTIAL (CANCER CENTER ONLY)
BASO#: 0 10*3/uL (ref 0.0–0.2)
Eosinophils Absolute: 0.3 10*3/uL (ref 0.0–0.5)
HCT: 43.2 % (ref 38.7–49.9)
HGB: 14 g/dL (ref 13.0–17.1)
LYMPH%: 35.7 % (ref 14.0–48.0)
MCV: 85 fL (ref 82–98)
MONO#: 0.7 10*3/uL (ref 0.1–0.9)
NEUT%: 41.8 % (ref 40.0–80.0)
Platelets: 237 10*3/uL (ref 145–400)
RBC: 5.11 10*6/uL (ref 4.20–5.70)
WBC: 4.6 10*3/uL (ref 4.0–10.0)

## 2013-04-01 LAB — LACTATE DEHYDROGENASE: LDH: 150 U/L (ref 94–250)

## 2013-04-01 NOTE — Telephone Encounter (Signed)
Harriett Sine at [facility] aware of referral and will contact pt to schedule

## 2013-04-01 NOTE — Progress Notes (Signed)
This office note has been dictated.

## 2013-04-02 NOTE — Progress Notes (Signed)
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  CURRENT THERAPY:  Patient status post 12 cycles of FOLFOX-completed in 01/2013.  INTERIM HISTORY:  [name] comes in for followup.  He is having a lot of issues at home.  He has a son who has brain trauma and is having a lot of issues.  His wife gave birth to twins.  He is having some problems with her.  He is having neuropathy from the oxaliplatin.  I told him that this will get better.  I do have him on some vitamin B6.  He says he is taking this.  He is a little deconditioned.  We will have to see about doing a little physical therapy for him.  When we last saw him, his CEA was 2.1.  His last scans were done back on July 29.  The CT scans did not show any evidence of recurrent disease.  His appetite is doing good.  He is having no problems with his bowels or bladder.  He has had no rashes.  PHYSICAL EXAMINATION:  General:  This is a well-developed, well- nourished black gentleman in no obvious distress.  Vital signs: Temperature of 97.8, pulse 96, respiratory rate 16, blood pressure 113/80.  Weight is 226 pounds.  Head and neck:  Normocephalic, atraumatic skull.  There are no ocular or oral lesions.  There are no palpable cervical or supraclavicular lymph nodes.  Lungs:  Clear bilaterally.  Cardiac:  Regular rate and rhythm with a normal S1, S2. There are no murmurs, rubs, or bruits.  Abdomen:  Soft.  He has well- healed laparotomy scar.  There is no fluid wave.  There was no palpable hepatosplenomegaly.  Extremities:  Show no clubbing, cyanosis or edema. Neurological:  Shows no focal neurological deficits.  LABORATORY STUDIES:  White cell count is 4.6, hemoglobin 14, hematocrit 43, platelet count is 237.  IMPRESSION:  [name] is a nice 42 year old African American gentleman with stage IIIC adenocarcinoma of the ascending colon.  He had 6 positive lymph nodes.  His tumor was KRAS  negative.  He completed his chemotherapy 2 months ago.  We will go ahead and plan for another scan in about 3 months or so.  He is going to let us know if he wants his Port-A-Cath out.  We will continue to pray hard for him.    ______________________________ Josph Macho, M.D. PRE/MEDQ  D:  04/01/2013  T:  04/02/2013  Job:  [id number]

## 2013-04-03 ENCOUNTER — Telehealth: Payer: Self-pay | Admitting: *Deleted

## 2013-04-03 NOTE — Telephone Encounter (Signed)
Message copied by Anselm Jungling on Thu Apr 03, 2013  1:12 PM ------      Message from: Arlan Organ R      Created: Wed Apr 02, 2013  4:56 PM       Call - labs look excellent!!  Cindee Lame ------

## 2013-04-03 NOTE — Telephone Encounter (Signed)
Called patient to let him know his labwork looked excellent per dr. Lupita Leash.  Left message on personal answering machine

## 2013-04-23 ENCOUNTER — Ambulatory Visit: Payer: Medicaid Other | Attending: Hematology & Oncology | Admitting: Physical Therapy

## 2013-04-23 DIAGNOSIS — M545 Low back pain, unspecified: Secondary | ICD-10-CM | POA: Insufficient documentation

## 2013-04-23 DIAGNOSIS — R5381 Other malaise: Secondary | ICD-10-CM | POA: Insufficient documentation

## 2013-04-23 DIAGNOSIS — Z9221 Personal history of antineoplastic chemotherapy: Secondary | ICD-10-CM | POA: Insufficient documentation

## 2013-04-23 DIAGNOSIS — M79609 Pain in unspecified limb: Secondary | ICD-10-CM | POA: Insufficient documentation

## 2013-04-23 DIAGNOSIS — Z85038 Personal history of other malignant neoplasm of large intestine: Secondary | ICD-10-CM | POA: Insufficient documentation

## 2013-04-23 DIAGNOSIS — IMO0001 Reserved for inherently not codable concepts without codable children: Secondary | ICD-10-CM | POA: Insufficient documentation

## 2013-04-30 ENCOUNTER — Ambulatory Visit: Payer: Medicaid Other

## 2013-05-12 ENCOUNTER — Telehealth: Payer: Self-pay | Admitting: Hematology & Oncology

## 2013-05-12 NOTE — Telephone Encounter (Signed)
Patient called and cx 05/13/13 apt due to being sick.  He stated, he will call back to resch

## 2013-06-18 ENCOUNTER — Telehealth: Payer: Self-pay | Admitting: Hematology & Oncology

## 2013-06-18 NOTE — Telephone Encounter (Signed)
Dr. Myna Hidalgo was unsuccessful in getting this approved w Medicaid, due to their guidelines for imaging.   I spoke w pt, as well as, let pre cert center know at this time.

## 2013-06-19 ENCOUNTER — Ambulatory Visit (HOSPITAL_BASED_OUTPATIENT_CLINIC_OR_DEPARTMENT_OTHER): Payer: Medicaid Other

## 2013-06-19 ENCOUNTER — Other Ambulatory Visit (HOSPITAL_BASED_OUTPATIENT_CLINIC_OR_DEPARTMENT_OTHER): Payer: Medicaid Other

## 2013-06-26 ENCOUNTER — Ambulatory Visit: Payer: Medicaid Other | Admitting: Hematology & Oncology

## 2013-06-26 ENCOUNTER — Other Ambulatory Visit: Payer: Medicaid Other | Admitting: Lab

## 2013-07-01 ENCOUNTER — Ambulatory Visit (HOSPITAL_BASED_OUTPATIENT_CLINIC_OR_DEPARTMENT_OTHER): Payer: Medicaid Other

## 2013-07-01 ENCOUNTER — Ambulatory Visit (HOSPITAL_BASED_OUTPATIENT_CLINIC_OR_DEPARTMENT_OTHER): Payer: Medicaid Other | Admitting: Hematology & Oncology

## 2013-07-01 ENCOUNTER — Other Ambulatory Visit (HOSPITAL_BASED_OUTPATIENT_CLINIC_OR_DEPARTMENT_OTHER): Payer: Medicaid Other | Admitting: Lab

## 2013-07-01 VITALS — BP 122/87 | HR 94 | Temp 98.3°F | Resp 18 | Ht 68.0 in | Wt 223.0 lb

## 2013-07-01 DIAGNOSIS — C182 Malignant neoplasm of ascending colon: Secondary | ICD-10-CM

## 2013-07-01 DIAGNOSIS — C779 Secondary and unspecified malignant neoplasm of lymph node, unspecified: Secondary | ICD-10-CM

## 2013-07-01 DIAGNOSIS — F17200 Nicotine dependence, unspecified, uncomplicated: Secondary | ICD-10-CM

## 2013-07-01 DIAGNOSIS — C189 Malignant neoplasm of colon, unspecified: Secondary | ICD-10-CM

## 2013-07-01 DIAGNOSIS — G589 Mononeuropathy, unspecified: Secondary | ICD-10-CM

## 2013-07-01 LAB — CBC WITH DIFFERENTIAL (CANCER CENTER ONLY)
BASO#: 0 10*3/uL (ref 0.0–0.2)
BASO%: 0.1 % (ref 0.0–2.0)
EOS%: 5 % (ref 0.0–7.0)
HGB: 15 g/dL (ref 13.0–17.1)
LYMPH#: 2 10*3/uL (ref 0.9–3.3)
MCHC: 32.8 g/dL (ref 32.0–35.9)
NEUT#: 3.9 10*3/uL (ref 1.5–6.5)
RDW: 14.8 % (ref 11.1–15.7)

## 2013-07-01 LAB — COMPREHENSIVE METABOLIC PANEL
ALT: 20 U/L (ref 0–53)
AST: 17 U/L (ref 0–37)
Albumin: 4.1 g/dL (ref 3.5–5.2)
Calcium: 9.4 mg/dL (ref 8.4–10.5)
Chloride: 103 mEq/L (ref 96–112)
Potassium: 4.2 mEq/L (ref 3.5–5.3)
Sodium: 140 mEq/L (ref 135–145)

## 2013-07-01 LAB — CEA: CEA: 8.5 ng/mL — ABNORMAL HIGH (ref 0.0–5.0)

## 2013-07-01 MED ORDER — SODIUM CHLORIDE 0.9 % IJ SOLN
10.0000 mL | INTRAMUSCULAR | Status: DC | PRN
  Administered 2013-07-01: 10 mL via INTRAVENOUS
  Filled 2013-07-01: qty 10

## 2013-07-01 MED ORDER — HEPARIN SOD (PORK) LOCK FLUSH 100 UNIT/ML IV SOLN
500.0000 [IU] | Freq: Once | INTRAVENOUS | Status: AC
  Administered 2013-07-01: 500 [IU] via INTRAVENOUS
  Filled 2013-07-01: qty 5

## 2013-07-01 NOTE — Progress Notes (Signed)
This office note has been dictated.

## 2013-07-03 NOTE — Addendum Note (Signed)
Addended by: Arlan Organ R on: 07/03/2013 05:52 PM   Modules accepted: Orders

## 2013-07-04 ENCOUNTER — Telehealth: Payer: Self-pay | Admitting: Hematology & Oncology
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
## 2013-07-04 NOTE — Telephone Encounter (Signed)
Pt aware of 11-20 CT to be NPO 4 hrs. Carrie aware pt will be going 2 hrs early to drink contrast. Delice Bison aware to precert

## 2013-07-10 ENCOUNTER — Encounter (HOSPITAL_COMMUNITY): Payer: Self-pay

## 2013-07-10 ENCOUNTER — Ambulatory Visit (HOSPITAL_COMMUNITY)
Admission: RE | Admit: 2013-07-10 | Discharge: 2013-07-10 | Disposition: A | Discharge status: Home / Self Care | Payer: Medicaid Other | Source: Ambulatory Visit | Attending: Hematology & Oncology | Admitting: Hematology & Oncology

## 2013-07-10 DIAGNOSIS — C182 Malignant neoplasm of ascending colon: Secondary | ICD-10-CM

## 2013-07-10 DIAGNOSIS — K7689 Other specified diseases of liver: Secondary | ICD-10-CM | POA: Insufficient documentation

## 2013-07-10 DIAGNOSIS — M799 Soft tissue disorder, unspecified: Secondary | ICD-10-CM | POA: Insufficient documentation

## 2013-07-10 DIAGNOSIS — C189 Malignant neoplasm of colon, unspecified: Secondary | ICD-10-CM | POA: Insufficient documentation

## 2013-07-10 DIAGNOSIS — Z9221 Personal history of antineoplastic chemotherapy: Secondary | ICD-10-CM | POA: Insufficient documentation

## 2013-07-10 DIAGNOSIS — K5730 Diverticulosis of large intestine without perforation or abscess without bleeding: Secondary | ICD-10-CM | POA: Insufficient documentation

## 2013-07-10 MED ORDER — IOHEXOL 300 MG/ML  SOLN
50.0000 mL | Freq: Once | INTRAMUSCULAR | Status: AC | PRN
  Administered 2013-07-10: 50 mL via ORAL

## 2013-07-10 MED ORDER — IOHEXOL 300 MG/ML  SOLN
100.0000 mL | Freq: Once | INTRAMUSCULAR | Status: AC | PRN
  Administered 2013-07-10: 100 mL via INTRAVENOUS

## 2013-07-11 NOTE — Addendum Note (Signed)
Addended by: Arlan Organ R on: 07/11/2013 06:11 PM   Modules accepted: Orders

## 2013-07-14 ENCOUNTER — Telehealth: Payer: Self-pay | Admitting: Hematology & Oncology

## 2013-07-14 NOTE — Telephone Encounter (Signed)
Pt aware of 12-8 PET and to be NPO 6 hrs. Pt is also aware scheduling will be calling with biopsy appointment for this week. Helmut Muster in scheduling has order in review. Dr. Myna Hidalgo aware PET is 12-8 not this week.

## 2013-07-15 ENCOUNTER — Other Ambulatory Visit: Payer: Self-pay | Admitting: *Deleted

## 2013-07-15 DIAGNOSIS — C182 Malignant neoplasm of ascending colon: Secondary | ICD-10-CM

## 2013-07-15 MED ORDER — HYDROCODONE-ACETAMINOPHEN 5-325 MG PO TABS: 1.0000 | ORAL_TABLET | ORAL | Status: DC | PRN

## 2013-07-15 NOTE — Telephone Encounter (Signed)
Pt called with questions regarding is upcoming biopsy and PET Scan. Reviewed the reason for both. Dr Myna Hidalgo previously discussed this with him but as soon as he heard that something showed up on his CT Scan, he handed the phone to his wife. He verbalized understanding why further testing was needed and understands to call if he has further questions. Also c/o int abd pain. He took Vicodin in the past with relief. Will route to Dr Myna Hidalgo for authorization and signature then place at the front desk for pick up.

## 2013-07-16 ENCOUNTER — Other Ambulatory Visit: Payer: Self-pay | Admitting: Radiology

## 2013-07-16 ENCOUNTER — Other Ambulatory Visit (HOSPITAL_COMMUNITY): Payer: Self-pay | Admitting: Radiology

## 2013-07-21 ENCOUNTER — Encounter (HOSPITAL_COMMUNITY): Payer: Self-pay

## 2013-07-21 ENCOUNTER — Ambulatory Visit (HOSPITAL_COMMUNITY)
Admission: RE | Admit: 2013-07-21 | Discharge: 2013-07-21 | Disposition: A | Discharge status: Home / Self Care | Payer: Medicaid Other | Source: Ambulatory Visit | Attending: Hematology & Oncology | Admitting: Hematology & Oncology

## 2013-07-21 ENCOUNTER — Encounter (INDEPENDENT_AMBULATORY_CARE_PROVIDER_SITE_OTHER): Payer: Self-pay | Admitting: Surgery

## 2013-07-21 DIAGNOSIS — C182 Malignant neoplasm of ascending colon: Secondary | ICD-10-CM

## 2013-07-21 DIAGNOSIS — C50919 Malignant neoplasm of unspecified site of unspecified female breast: Secondary | ICD-10-CM | POA: Insufficient documentation

## 2013-07-21 DIAGNOSIS — IMO0002 Reserved for concepts with insufficient information to code with codable children: Secondary | ICD-10-CM | POA: Insufficient documentation

## 2013-07-21 DIAGNOSIS — Z9049 Acquired absence of other specified parts of digestive tract: Secondary | ICD-10-CM | POA: Insufficient documentation

## 2013-07-21 DIAGNOSIS — Z85038 Personal history of other malignant neoplasm of large intestine: Secondary | ICD-10-CM | POA: Insufficient documentation

## 2013-07-21 DIAGNOSIS — Z01812 Encounter for preprocedural laboratory examination: Secondary | ICD-10-CM | POA: Insufficient documentation

## 2013-07-21 DIAGNOSIS — R109 Unspecified abdominal pain: Secondary | ICD-10-CM

## 2013-07-21 DIAGNOSIS — D649 Anemia, unspecified: Secondary | ICD-10-CM | POA: Insufficient documentation

## 2013-07-21 DIAGNOSIS — Y838 Other surgical procedures as the cause of abnormal reaction of the patient, or of later complication, without mention of misadventure at the time of the procedure: Secondary | ICD-10-CM | POA: Insufficient documentation

## 2013-07-21 LAB — CBC
HCT: 43.6 % (ref 39.0–52.0)
MCH: 26.3 pg (ref 26.0–34.0)
MCH: 26.2 pg (ref 26.0–34.0)
MCHC: 33.6 g/dL (ref 30.0–36.0)
MCHC: 33 g/dL (ref 30.0–36.0)
MCV: 79.4 fL (ref 78.0–100.0)
Platelets: 230 10*3/uL (ref 150–400)
Platelets: 225 10*3/uL (ref 150–400)
RDW: 14.7 % (ref 11.5–15.5)
WBC: 5.3 10*3/uL (ref 4.0–10.5)

## 2013-07-21 LAB — PROTIME-INR: Prothrombin Time: 13 seconds (ref 11.6–15.2)

## 2013-07-21 MED ORDER — FENTANYL CITRATE 0.05 MG/ML IJ SOLN
INTRAMUSCULAR | Status: AC
  Filled 2013-07-21: qty 4

## 2013-07-21 MED ORDER — IOHEXOL 300 MG/ML  SOLN
25.0000 mL | Freq: Once | INTRAMUSCULAR | Status: AC | PRN
  Administered 2013-07-21: 25 mL via ORAL

## 2013-07-21 MED ORDER — LIDOCAINE HCL 1 % IJ SOLN
INTRAMUSCULAR | Status: AC
  Filled 2013-07-21: qty 10

## 2013-07-21 MED ORDER — FENTANYL CITRATE 0.05 MG/ML IJ SOLN
INTRAMUSCULAR | Status: DC | PRN
  Administered 2013-07-21 (×3): 50 ug via INTRAVENOUS

## 2013-07-21 MED ORDER — MIDAZOLAM HCL 2 MG/2ML IJ SOLN
INTRAMUSCULAR | Status: AC
  Filled 2013-07-21: qty 6

## 2013-07-21 MED ORDER — SODIUM CHLORIDE 0.9 % IV SOLN
INTRAVENOUS | Status: DC
  Administered 2013-07-21: 08:00:00 via INTRAVENOUS

## 2013-07-21 MED ORDER — MIDAZOLAM HCL 2 MG/2ML IJ SOLN
INTRAMUSCULAR | Status: DC | PRN
  Administered 2013-07-21 (×2): 2 mg via INTRAVENOUS

## 2013-07-21 NOTE — Consult Note (Signed)
David Conrad 1970-10-22  [id number].    Requesting MD: Dr. Archer Asa Chief Complaint/Reason for Consult: Post IR biopsy mesentery hemorrhage  HPI:  42 y.o. male with PMHx of History of stage IIIC adenocarcinoma of ascending colon s/p resection 05/2012 by Dr. Derrell Lolling. The patient has been c/o diffuse abdominal pain and constipation. He denies any blood in his stool or urine. He did have a CT abdomen/pelvis on 07/10/13 which revealed three new soft tissue nodules and masses in the abdomen and pelvis.  He presented today to IR department for IR perc biopsy with CT guidance.  Dr. Archer Asa noted a trace mesenteric hemorrhage after the procedure which was stable at 10 minutes after the procedure.  Hgb/Hct preop today were 15.1/45.0.  After the procedure he c/o 6/10 right lateral abdominal pain.  Pain is currently significantly improved and is more of a soreness.  No N/V, fever/chills.  Pain around biopsy in the right lateral abdomen.  The patient is very hungry, but he is being kept on sips/chips only.  ROS: All systems reviewed and otherwise negative except for as above  Family History  Problem Relation Age of Onset  . Alcohol abuse Father     Past Medical History  Diagnosis Date  . Anemia     Past Surgical History  Procedure Laterality Date  . Colonoscopy  06/17/2012    Procedure: COLONOSCOPY;  Surgeon: Charna Elizabeth, MD;  Location[facility] ENDOSCOPY;  Service: Endoscopy;  Laterality: N/A;  . Colon resection  06/19/2012    Procedure: COLON RESECTION LAPAROSCOPIC;  Surgeon: Axel Filler, MD;  Location: [facility] OR;  Service: General;  Laterality: Right;  . Colostomy revision  06/19/2012    Procedure: COLON RESECTION RIGHT;  Surgeon: Axel Filler, MD;  Location: [facility] OR;  Service: General;  Laterality: Right;    Social History:  reports that he has been smoking.  He has never used smokeless tobacco. He reports that he does not drink alcohol or use illicit drugs.  Allergies: No Known  Allergies   (Not in a hospital admission)  Blood pressure 116/64, pulse 80, temperature 98.2 F (36.8 C), temperature source Oral, resp. rate 18, height 5\' 7"  (1.702 m), weight 223 lb (101.152 kg), SpO2 98.00%. Physical Exam: General: pleasant, WD/WN AA male  who is laying in bed in NAD HEENT: head is normocephalic, atraumatic.  Sclera are noninjected.  PERRL.  Ears and nose without any masses or lesions.  Mouth is pink and moist Heart: regular, rate, and rhythm.  No obvious murmurs, gallops, or rubs noted.  Palpable pedal pulses bilaterally Lungs: CTAB, no wheezes, rhonchi, or rales noted.  Respiratory effort nonlabored Abd: soft, ND, mild tenderness in the right lateral abdomen, +BS, no masses, hernias, or organomegaly, biopsy site without erythema.   MS: all 4 extremities are symmetrical with no cyanosis, clubbing, or edema. Skin: warm and dry with no masses, lesions, or rashes Psych: A&Ox3 with an appropriate affect.  Results for orders placed during the hospital encounter of 07/21/13 (from the past 48 hour(s))  APTT     Status: None   Collection Time    07/21/13  7:54 AM      Result Value Range   aPTT 28  24 - 37 seconds  CBC     Status: None   Collection Time    07/21/13  7:54 AM      Result Value Range   WBC 5.6  4.0 - 10.5 K/uL   RBC 5.75  4.22 - 5.81 MIL/uL  Hemoglobin 15.1  13.0 - 17.0 g/dL   HCT 16.1  09.6 - 04.5 %   MCV 78.3  78.0 - 100.0 fL   MCH 26.3  26.0 - 34.0 pg   MCHC 33.6  30.0 - 36.0 g/dL   RDW 40.9  81.1 - 91.4 %   Platelets 230  150 - 400 K/uL  PROTIME-INR     Status: None   Collection Time    07/21/13  7:54 AM      Result Value Range   Prothrombin Time 13.0  11.6 - 15.2 seconds   INR 1.00  0.00 - 1.49   No results found.     Assessment/Plan H/o stage IIIC adenocarcinoma of ascending colon Post IR biopsy trace mesenteric hemorrhage Abdominal pain  Plan: 1.  Recheck CBC at 1600, and re-examen if stable then can likely go home 2.  If pain  increases or Hgb/Hct decreasing then will keep for 24 hours for serial Hgb/Hct 3.  NPO until recheck at least, will follow  DORT, [facility] 07/21/2013, 11:48 AM Pager: [phone]

## 2013-07-21 NOTE — Progress Notes (Signed)
Dr. Jamey Ripa at bedside to see pt. Instructed that pt can be discharged.

## 2013-07-21 NOTE — Progress Notes (Signed)
Interventional Radiology Progress Note  Pt seen and examined.  The initial pain David Conrad felt following the biopsy procedure resolved earlier this afternoon and has remained at bay for his 4 hour post observation period.  His H&H remain stable and his abdomen is soft and non tender to my exam.   I greatly appreciate Dr. Jamey Ripa and his team seeing David Conrad as well.  Based on his note, he also feels that David Conrad abdomen is benign and he appears well enough to go home.  David Conrad is aware that he should call or report to the nearest ER if he has any symptoms while at home including, weakness, dizziness, increasing abdominal pain, nausea/vomitting or fever over the next several days.   Signed,  Sterling Big, MD [facility]

## 2013-07-21 NOTE — Progress Notes (Signed)
Patient ID: David Conrad, male   DOB: Jul 15, 1971, 42 y.o.   MRN: [id number] Patient seen again at 1630. He is quite comfortable, VS have been very stable, and his abdomen is benign. His Hgs appears stable, 14.6, which I don't think is significantly lower than pre procedure.  I think he is OK to go home. Discussed his need to return to ED if increasing abd pain, weakness, cold,clammy, sweaty etc.

## 2013-07-21 NOTE — Consult Note (Signed)
Agree with A&P of MD,PA His abdomen is benign and he reports his pain much improved from initial.

## 2013-07-21 NOTE — Progress Notes (Signed)
Dr. Archer Asa instructed that pt can be discharged.

## 2013-07-21 NOTE — ED Notes (Signed)
Pt c/o abd pain.  MD made aware.  Orders given

## 2013-07-21 NOTE — H&P (Signed)
Chief Complaint: "I am here for a biopsy." Referring Physician: Dr. Myna Hidalgo HPI: David Conrad is an 42 y.o. male with PMHx of History of stage IIIC adenocarcinoma of ascending colon s/p resection 05/2012. The patient has been c/o diffuse abdominal pain and constipation. He denies any blood in his stool or urine. He did have a CT abdomen/pelvis on 07/10/13 which revealed three new soft tissue nodules and masses in the abdomen and pelvis. Request for image guided abdominal nodule biopsy was received. The patient is a tobacco user. He denies any chest pain or shortness of breath. He denies any difficulty with previous sedation during his port placement.   Past Medical History:  Past Medical History  Diagnosis Date  . Anemia     Past Surgical History:  Past Surgical History  Procedure Laterality Date  . Colonoscopy  06/17/2012    Procedure: COLONOSCOPY;  Surgeon: Charna Elizabeth, MD;  Location: [facility] ENDOSCOPY;  Service: Endoscopy;  Laterality: N/A;  . Colon resection  06/19/2012    Procedure: COLON RESECTION LAPAROSCOPIC;  Surgeon: Axel Filler, MD;  Location: [facility] OR;  Service: General;  Laterality: Right;  . Colostomy revision  06/19/2012    Procedure: COLON RESECTION RIGHT;  Surgeon: Axel Filler, MD;  Location: [facility] OR;  Service: General;  Laterality: Right;    Family History:  Family History  Problem Relation Age of Onset  . Alcohol abuse Father     Social History:  reports that he has been smoking.  He has never used smokeless tobacco. He reports that he does not drink alcohol or use illicit drugs.  Allergies: No Known Allergies    Medication List    ASK your doctor about these medications       DULoxetine 60 MG capsule  Commonly known as:  CYMBALTA  Take 1 capsule (60 mg total) by mouth daily.     HYDROcodone-acetaminophen 5-325 MG per tablet  Commonly known as:  NORCO/VICODIN  Take 1-2 tablets by mouth every 4 (four) hours as needed for moderate pain.     ibuprofen  200 MG tablet  Commonly known as:  ADVIL,MOTRIN  Take 200 mg by mouth every 6 (six) hours as needed.     lidocaine-prilocaine cream  Commonly known as:  EMLA  Apply topically as needed.     prochlorperazine 10 MG tablet  Commonly known as:  COMPAZINE  Take 1 tablet (10 mg total) by mouth every 6 (six) hours as needed (Nausea or vomiting).     vitamin B-6 250 MG tablet  Take 1 tablet (250 mg total) by mouth daily.       Please HPI for pertinent positives, otherwise complete 10 system ROS negative.  Physical Exam: BP 129/96  Pulse 87  Temp(Src) 97.1 F (36.2 C) (Oral)  Resp 18  Ht 5\' 7"  (1.702 m)  Wt 223 lb (101.152 kg)  BMI 34.92 kg/m2  SpO2 99% Body mass index is 34.92 kg/(m^2).  General Appearance:  Alert, cooperative, no distress  Head:  Normocephalic, without obvious abnormality, atraumatic  Neck: Supple, symmetrical, trachea midline  Lungs:   Clear to auscultation bilaterally, no w/r/r, respirations unlabored without use of accessory muscles.  Chest Wall:  No tenderness or deformity  Heart:  Regular rate and rhythm, S1, S2 normal, no murmur, rub or gallop.  Abdomen:   Soft, non-tender, non distended, (+) BS  Extremities: Extremities normal, atraumatic, no cyanosis or edema  Neurologic: Normal affect, no gross deficits.   Results for orders placed during the hospital  encounter of 07/21/13 (from the past 48 hour(s))  APTT     Status: None   Collection Time    07/21/13  7:54 AM      Result Value Range   aPTT 28  24 - 37 seconds  CBC     Status: None   Collection Time    07/21/13  7:54 AM      Result Value Range   WBC 5.6  4.0 - 10.5 K/uL   RBC 5.75  4.22 - 5.81 MIL/uL   Hemoglobin 15.1  13.0 - 17.0 g/dL   HCT 78.2  95.6 - 21.3 %   MCV 78.3  78.0 - 100.0 fL   MCH 26.3  26.0 - 34.0 pg   MCHC 33.6  30.0 - 36.0 g/dL   RDW 08.6  57.8 - 46.9 %   Platelets 230  150 - 400 K/uL  PROTIME-INR     Status: None   Collection Time    07/21/13  7:54 AM      Result Value  Range   Prothrombin Time 13.0  11.6 - 15.2 seconds   INR 1.00  0.00 - 1.49   No results found.  Assessment/Plan History of stage IIIC adenocarcinoma of ascending colon s/p resection 05/2012 Abdominal pain. Constipation. CT abdomen/pelvis 07/10/13 revealed three new soft tissue nodules and masses in the abdomen and pelvis. Request for image guided abdominal nodule biopsy. Patient receiving oral contrast prior to biopsy per Dr. Archer Asa. Images and labs reviewed, patient has been NPO Risks and Benefits discussed with the patient. All of the patient's questions were answered, patient is agreeable to proceed. Consent signed and in chart.  Pattricia Boss D PA-C 07/21/2013, 8:36 AM

## 2013-07-21 NOTE — Progress Notes (Signed)
CBC results called to Dr. Jamey Ripa.

## 2013-07-21 NOTE — Procedures (Signed)
Interventional Radiology Procedure Note  Procedure: CT guided biopsy of right abdominal mass Complications: Trace mesenteric hematoma, stable after 10 minutes by CT Recommendations: - Bedrest x 4 hours - Sips and chips only - Pathology pending  Signed,  Sterling Big, MD [facility]

## 2013-07-22 ENCOUNTER — Telehealth: Payer: Self-pay | Admitting: Emergency Medicine
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
## 2013-07-25 ENCOUNTER — Telehealth: Payer: Self-pay | Admitting: Emergency Medicine

## 2013-07-25 NOTE — Telephone Encounter (Signed)
LMOVM TO CK STATUS OF PT TODAY AND TO GIVE HIM SOME INFORMATION PER DR MCCULLOUGH-  11:09AM- PT CALLED BACK- HE IS DOING WELL POST BX- SOME PAIN/TENDERNESS, NO FEVER, SWEATS,CHILLS-  TOLD PT TO CALL us IMMEDIATELY IF THAT OCCURS.  THE BX WAS A POS. TUMOR AND LITTLE FRAGMENTS OF BOWEL WERE PRESENTED.     TOLD PT TO F/U W/ THE DR THAT REFERRED HIM FOR THE BX AND TO CALL IF SYSPTOMS OCCUR.

## 2013-07-28 ENCOUNTER — Encounter (HOSPITAL_COMMUNITY)
Admission: RE | Admit: 2013-07-28 | Discharge: 2013-07-28 | Disposition: A | Discharge status: Home / Self Care | Payer: Medicaid Other | Source: Ambulatory Visit | Attending: Hematology & Oncology | Admitting: Hematology & Oncology

## 2013-07-28 DIAGNOSIS — R1909 Other intra-abdominal and pelvic swelling, mass and lump: Secondary | ICD-10-CM | POA: Insufficient documentation

## 2013-07-28 DIAGNOSIS — C182 Malignant neoplasm of ascending colon: Secondary | ICD-10-CM

## 2013-07-28 DIAGNOSIS — C189 Malignant neoplasm of colon, unspecified: Secondary | ICD-10-CM | POA: Insufficient documentation

## 2013-07-28 DIAGNOSIS — Z9049 Acquired absence of other specified parts of digestive tract: Secondary | ICD-10-CM | POA: Insufficient documentation

## 2013-07-28 LAB — GLUCOSE, CAPILLARY: Glucose-Capillary: 93 mg/dL (ref 70–99)

## 2013-07-28 MED ORDER — FLUDEOXYGLUCOSE F - 18 (FDG) INJECTION
19.7000 | Freq: Once | INTRAVENOUS | Status: AC | PRN
  Administered 2013-07-28: 19.7 via INTRAVENOUS

## 2013-08-04 ENCOUNTER — Encounter (HOSPITAL_COMMUNITY): Payer: Self-pay

## 2013-08-04 ENCOUNTER — Telehealth: Payer: Self-pay | Admitting: Hematology & Oncology

## 2013-08-04 NOTE — Telephone Encounter (Signed)
I spoke with Mr. Osuna today on the phone.  I spoke with his surgeon last week and we decided to see if we could not get Mr. Demmer to [facility] for a consultation to see if he qualifies for HIPC.  \ He only has localized recurrence, so this could be potentially resectable and then give HIPC. I think he would be a good candidate for this.  I tried to explain this to him as best as I could.  He has a pretty good understanding and will go out there.  Marilynne Drivers has already called him -- he will see Dr. Flonnie Hailstone on 12/19.  I called radiology to get his scans on a disc for Dr. Flonnie Hailstone to see.  Mr. Beaubrun will pick this up before he goes. The disc will be at [facility].  I just want to be as aggressive as possible with this recurrent disease.  Of note, his cancer is KRAS wild type.  Hewitt Shorts

## 2013-08-05 ENCOUNTER — Telehealth: Payer: Self-pay | Admitting: Hematology & Oncology

## 2013-08-05 ENCOUNTER — Encounter: Payer: Self-pay | Admitting: Hematology & Oncology

## 2013-08-05 DIAGNOSIS — C786 Secondary malignant neoplasm of retroperitoneum and peritoneum: Secondary | ICD-10-CM | POA: Insufficient documentation

## 2013-08-05 NOTE — Telephone Encounter (Signed)
Faxed all records including demo sheet today, per Amy to: Dr. Flonnie Hailstone @ [phone]

## 2013-08-12 ENCOUNTER — Ambulatory Visit: Payer: Medicaid Other

## 2013-08-12 ENCOUNTER — Ambulatory Visit (HOSPITAL_BASED_OUTPATIENT_CLINIC_OR_DEPARTMENT_OTHER): Payer: Medicaid Other | Admitting: Lab

## 2013-08-12 ENCOUNTER — Other Ambulatory Visit: Payer: Self-pay | Admitting: *Deleted

## 2013-08-12 ENCOUNTER — Encounter: Payer: Self-pay | Admitting: Hematology & Oncology

## 2013-08-12 ENCOUNTER — Other Ambulatory Visit: Payer: Self-pay | Admitting: Hematology & Oncology

## 2013-08-12 VITALS — BP 118/74 | HR 85 | Temp 98.1°F | Resp 20

## 2013-08-12 DIAGNOSIS — C189 Malignant neoplasm of colon, unspecified: Secondary | ICD-10-CM

## 2013-08-12 DIAGNOSIS — C182 Malignant neoplasm of ascending colon: Secondary | ICD-10-CM

## 2013-08-12 LAB — CBC WITH DIFFERENTIAL (CANCER CENTER ONLY)
BASO%: 0.2 % (ref 0.0–2.0)
Eosinophils Absolute: 0.3 10*3/uL (ref 0.0–0.5)
HCT: 44.4 % (ref 38.7–49.9)
LYMPH#: 1.8 10*3/uL (ref 0.9–3.3)
LYMPH%: 34.8 % (ref 14.0–48.0)
MCV: 79 fL — ABNORMAL LOW (ref 82–98)
MONO#: 0.7 10*3/uL (ref 0.1–0.9)
NEUT%: 46 % (ref 40.0–80.0)
Platelets: 185 10*3/uL (ref 145–400)
RBC: 5.6 10*6/uL (ref 4.20–5.70)
RDW: 15.7 % (ref 11.1–15.7)
WBC: 5.2 10*3/uL (ref 4.0–10.0)

## 2013-08-12 LAB — COMPREHENSIVE METABOLIC PANEL
AST: 15 U/L (ref 0–37)
Albumin: 4.1 g/dL (ref 3.5–5.2)
Alkaline Phosphatase: 83 U/L (ref 39–117)
BUN: 10 mg/dL (ref 6–23)
Creatinine, Ser: 0.94 mg/dL (ref 0.50–1.35)
Glucose, Bld: 93 mg/dL (ref 70–99)
Potassium: 4.1 mEq/L (ref 3.5–5.3)
Total Bilirubin: 0.3 mg/dL (ref 0.3–1.2)
Total Protein: 6.8 g/dL (ref 6.0–8.3)

## 2013-08-12 LAB — CEA: CEA: 8.7 ng/mL — ABNORMAL HIGH (ref 0.0–5.0)

## 2013-08-12 MED ORDER — SODIUM CHLORIDE 0.9 % IJ SOLN
10.0000 mL | INTRAMUSCULAR | Status: DC | PRN
  Administered 2013-08-12: 10 mL via INTRAVENOUS
  Filled 2013-08-12: qty 10

## 2013-08-12 MED ORDER — HEPARIN SOD (PORK) LOCK FLUSH 100 UNIT/ML IV SOLN
500.0000 [IU] | Freq: Once | INTRAVENOUS | Status: AC
  Administered 2013-08-12: 500 [IU] via INTRAVENOUS
  Filled 2013-08-12: qty 5

## 2013-08-12 NOTE — Progress Notes (Signed)
error 

## 2013-08-12 NOTE — Progress Notes (Signed)
David Conrad presented for Portacath access and flush. Proper placement of portacath confirmed by CXR. Portacath located in the R chest wall accessed with  20G needle.site WNL with good blood return.  Portacath flushed with 20ml NS and 500U/39ml Heparin per protocol and needle removed intact. Procedure without incident. Patient tolerated procedure well.

## 2013-08-12 NOTE — Patient Instructions (Signed)
Implanted Port Instructions  An implanted port is a central line that has a round shape and is placed under the skin. It is used for long-term IV (intravenous) access for:  · Medicine.  · Fluids.  · Liquid nutrition, such as TPN (total parenteral nutrition).  · Blood samples.  Ports can be placed:  · In the chest area just below the collarbone (this is the most common place.)  · In the arms.  · In the belly (abdomen) area.  · In the legs.  PARTS OF THE PORT  A port has 2 main parts:  · The reservoir. The reservoir is round, disc-shaped, and will be a small, raised area under your skin.  · The reservoir is the part where a needle is inserted (accessed) to either give medicines or to draw blood.  · The catheter. The catheter is a long, slender tube that extends from the reservoir. The catheter is placed into a large vein.  · Medicine that is inserted into the reservoir goes into the catheter and then into the vein.  INSERTION OF THE PORT  · The port is surgically placed in either an operating room or in a procedural area (interventional radiology).  · Medicine may be given to help you relax during the procedure.  · The skin where the port will be inserted is numbed (local anesthetic).  · 1 or 2 small cuts (incisions) will be made in the skin to insert the port.  · The port can be used after it has been inserted.  INCISION SITE CARE  · The incision site may have small adhesive strips on it. This helps keep the incision site closed. Sometimes, no adhesive strips are placed. Instead of adhesive strips, a special kind of surgical glue is used to keep the incision closed.  · If adhesive strips were placed on the incision sites, do not take them off. They will fall off on their own.  · The incision site may be sore for 1 to 2 days. Pain medicine can help.  · Do not get the incision site wet. Bathe or shower as directed by your caregiver.  · The incision site should heal in 5 to 7 days. A small scar may form after the  incision has healed.  ACCESSING THE PORT  Special steps must be taken to access the port:  · Before the port is accessed, a numbing cream can be placed on the skin. This helps numb the skin over the port site.  · A sterile technique is used to access the port.  · The port is accessed with a needle. Only "non-coring" port needles should be used to access the port. Once the port is accessed, a blood return should be checked. This helps ensure the port is in the vein and is not clogged (clotted).  · If your caregiver believes your port should remain accessed, a clear (transparent) bandage will be placed over the needle site. The bandage and needle will need to be changed every week or as directed by your caregiver.  · Keep the bandage covering the needle clean and dry. Do not get it wet. Follow your caregiver's instructions on how to take a shower or bath when the port is accessed.  · If your port does not need to stay accessed, no bandage is needed over the port.  FLUSHING THE PORT  Flushing the port keeps it from getting clogged. How often the port is flushed depends on:  · If a   constant infusion is running. If a constant infusion is running, the port may not need to be flushed.  · If intermittent medicines are given.  · If the port is not being used.  For intermittent medicines:  · The port will need to be flushed:  · After medicines have been given.  · After blood has been drawn.  · As part of routine maintenance.  · A port is normally flushed with:  · Normal saline.  · Heparin.  · Follow your caregiver's advice on how often, how much, and the type of flush to use on your port.  IMPORTANT PORT INFORMATION  · Tell your caregiver if you are allergic to heparin.  · After your port is placed, you will get a manufacturer's information card. The card has information about your port. Keep this card with you at all times.  · There are many types of ports available. Know what kind of port you have.  · In case of an  emergency, it may be helpful to wear a medical alert bracelet. This can help alert health care workers that you have a port.  · The port can stay in for as long as your caregiver believes it is necessary.  · When it is time for the port to come out, surgery will be done to remove it. The surgery will be similar to how the port was put in.  · If you are in the hospital or clinic:  · Your port will be taken care of and flushed by a nurse.  · If you are at home:  · A home health care nurse may give medicines and take care of the port.  · You or a family member can get special training and directions for giving medicine and taking care of the port at home.  SEEK IMMEDIATE MEDICAL CARE IF:   · Your port does not flush or you are unable to get a blood return.  · New drainage or pus is coming from the incision.  · A bad smell is coming from the incision site.  · You develop swelling or increased redness at the incision site.  · You develop increased swelling or pain at the port site.  · You develop swelling or pain in the surrounding skin near the port.  · You have an oral temperature above 102° F (38.9° C), not controlled by medicine.  MAKE SURE YOU:   · Understand these instructions.  · Will watch your condition.  · Will get help right away if you are not doing well or get worse.  Document Released: 08/07/2005 Document Revised: 10/30/2011 Document Reviewed: 10/29/2008  Exit[employer]® Patient Information ©2014 [employer].

## 2013-08-13 ENCOUNTER — Telehealth: Payer: Self-pay | Admitting: Nurse Practitioner

## 2013-08-13 NOTE — Telephone Encounter (Addendum)
Message copied by Glee Arvin on Wed Aug 13, 2013 10:56 AM ------      Message from: Josph Macho      Created: Tue Aug 12, 2013 10:15 PM       Call - labs are great!!  The cancer level has NOT changed!!! This is encouraging!!  Cindee Lame -----Pt verbalized understanding and appreciation.

## 2013-09-03 ENCOUNTER — Encounter (HOSPITAL_COMMUNITY): Payer: Self-pay

## 2013-09-14 ENCOUNTER — Other Ambulatory Visit: Payer: Self-pay | Admitting: Hematology & Oncology

## 2013-09-14 DIAGNOSIS — J069 Acute upper respiratory infection, unspecified: Secondary | ICD-10-CM

## 2013-09-14 MED ORDER — GUAIFENESIN-CODEINE 100-10 MG/5ML PO SOLN: 5.0000 mL | Freq: Three times a day (TID) | ORAL | Status: DC | PRN

## 2013-09-16 ENCOUNTER — Other Ambulatory Visit: Payer: Self-pay | Admitting: Hematology & Oncology

## 2013-09-16 DIAGNOSIS — C182 Malignant neoplasm of ascending colon: Secondary | ICD-10-CM

## 2013-09-17 ENCOUNTER — Telehealth: Payer: Self-pay | Admitting: Hematology & Oncology

## 2013-09-17 NOTE — Telephone Encounter (Signed)
Left message 2-4 appointment cx everything else stays the same

## 2013-09-18 ENCOUNTER — Ambulatory Visit (HOSPITAL_COMMUNITY)
Admission: RE | Admit: 2013-09-18 | Discharge: 2013-09-18 | Disposition: A | Discharge status: Home / Self Care | Payer: Medicaid Other | Source: Ambulatory Visit | Attending: Hematology & Oncology | Admitting: Hematology & Oncology

## 2013-09-18 DIAGNOSIS — C182 Malignant neoplasm of ascending colon: Secondary | ICD-10-CM | POA: Insufficient documentation

## 2013-09-18 DIAGNOSIS — K7689 Other specified diseases of liver: Secondary | ICD-10-CM | POA: Diagnosis not present

## 2013-09-18 DIAGNOSIS — C786 Secondary malignant neoplasm of retroperitoneum and peritoneum: Secondary | ICD-10-CM | POA: Insufficient documentation

## 2013-09-18 MED ORDER — GADOBENATE DIMEGLUMINE 529 MG/ML IV SOLN
20.0000 mL | Freq: Once | INTRAVENOUS | Status: AC | PRN
  Administered 2013-09-18: 20 mL via INTRAVENOUS

## 2013-09-23 ENCOUNTER — Telehealth: Payer: Self-pay | Admitting: Oncology

## 2013-09-23 NOTE — Telephone Encounter (Signed)
Message copied by Cottie Banda on Tue Sep 23, 2013  2:38 PM ------      Message from: Burney Gauze R      Created: Mon Sep 22, 2013  6:52 PM       I left a message on his cel phone that the MRI of the liver shows NO cancer.  Looks like cysts that were there in 2013.  Will now set up chemo.  Pete ------

## 2013-09-24 ENCOUNTER — Ambulatory Visit (HOSPITAL_BASED_OUTPATIENT_CLINIC_OR_DEPARTMENT_OTHER): Payer: Medicaid Other

## 2013-09-24 ENCOUNTER — Other Ambulatory Visit (HOSPITAL_BASED_OUTPATIENT_CLINIC_OR_DEPARTMENT_OTHER): Payer: Medicaid Other

## 2013-09-25 ENCOUNTER — Telehealth: Payer: Self-pay | Admitting: *Deleted

## 2013-09-25 NOTE — Telephone Encounter (Signed)
I left a message on his cel phone that the MRI of the liver shows NO cancer. Looks like cysts that were there in 2013. Will now set up chemo

## 2013-09-27 ENCOUNTER — Other Ambulatory Visit: Payer: Self-pay | Admitting: Hematology & Oncology

## 2013-09-27 DIAGNOSIS — C182 Malignant neoplasm of ascending colon: Secondary | ICD-10-CM

## 2013-10-01 ENCOUNTER — Other Ambulatory Visit: Payer: Self-pay | Admitting: Hematology & Oncology

## 2013-10-01 ENCOUNTER — Other Ambulatory Visit: Payer: Medicaid Other | Admitting: Lab

## 2013-10-01 ENCOUNTER — Encounter: Payer: Self-pay | Admitting: Hematology & Oncology

## 2013-10-01 ENCOUNTER — Ambulatory Visit: Payer: Medicaid Other | Admitting: Hematology & Oncology

## 2013-10-01 ENCOUNTER — Ambulatory Visit (HOSPITAL_BASED_OUTPATIENT_CLINIC_OR_DEPARTMENT_OTHER): Payer: Medicaid Other | Admitting: Hematology & Oncology

## 2013-10-01 ENCOUNTER — Ambulatory Visit: Payer: Medicaid Other

## 2013-10-01 VITALS — BP 128/81 | HR 91 | Temp 98.2°F | Resp 18 | Ht 67.0 in | Wt 225.0 lb

## 2013-10-01 DIAGNOSIS — C182 Malignant neoplasm of ascending colon: Secondary | ICD-10-CM

## 2013-10-01 DIAGNOSIS — G620 Drug-induced polyneuropathy: Secondary | ICD-10-CM

## 2013-10-01 DIAGNOSIS — C779 Secondary and unspecified malignant neoplasm of lymph node, unspecified: Secondary | ICD-10-CM

## 2013-10-01 LAB — COMPREHENSIVE METABOLIC PANEL
ALBUMIN: 3.8 g/dL (ref 3.5–5.2)
ALK PHOS: 80 U/L (ref 39–117)
ALT: 15 U/L (ref 0–53)
AST: 14 U/L (ref 0–37)
BUN: 13 mg/dL (ref 6–23)
CO2: 25 mEq/L (ref 19–32)
CREATININE: 0.95 mg/dL (ref 0.50–1.35)
Calcium: 9.4 mg/dL (ref 8.4–10.5)
Chloride: 106 mEq/L (ref 96–112)
Glucose, Bld: 83 mg/dL (ref 70–99)
POTASSIUM: 4.2 meq/L (ref 3.5–5.3)
Sodium: 141 mEq/L (ref 135–145)
Total Bilirubin: 0.3 mg/dL (ref 0.2–1.2)
Total Protein: 6.9 g/dL (ref 6.0–8.3)

## 2013-10-01 LAB — CBC WITH DIFFERENTIAL (CANCER CENTER ONLY)
BASO#: 0 10*3/uL (ref 0.0–0.2)
BASO%: 0.2 % (ref 0.0–2.0)
EOS%: 4.5 % (ref 0.0–7.0)
Eosinophils Absolute: 0.3 10*3/uL (ref 0.0–0.5)
HCT: 41.6 % (ref 38.7–49.9)
HGB: 13.4 g/dL (ref 13.0–17.1)
LYMPH#: 2.1 10*3/uL (ref 0.9–3.3)
LYMPH%: 34.1 % (ref 14.0–48.0)
MCH: 25.5 pg — ABNORMAL LOW (ref 28.0–33.4)
MCHC: 32.2 g/dL (ref 32.0–35.9)
MCV: 79 fL — ABNORMAL LOW (ref 82–98)
MONO#: 0.9 10*3/uL (ref 0.1–0.9)
MONO%: 14.8 % — ABNORMAL HIGH (ref 0.0–13.0)
NEUT%: 46.4 % (ref 40.0–80.0)
NEUTROS ABS: 2.9 10*3/uL (ref 1.5–6.5)
Platelets: 311 10*3/uL (ref 145–400)
RBC: 5.25 10*6/uL (ref 4.20–5.70)
RDW: 15.4 % (ref 11.1–15.7)
WBC: 6.2 10*3/uL (ref 4.0–10.0)

## 2013-10-01 LAB — CEA: CEA: 23.6 ng/mL — ABNORMAL HIGH (ref 0.0–5.0)

## 2013-10-01 NOTE — Patient Instructions (Signed)
You Can Quit Smoking If you are ready to quit smoking or are thinking about it, congratulations! You have chosen to help yourself be healthier and live longer! There are lots of different ways to quit smoking. Nicotine gum, nicotine patches, a nicotine inhaler, or nicotine nasal spray can help with physical craving. Hypnosis, support groups, and medicines help break the habit of smoking. TIPS TO GET OFF AND STAY OFF CIGARETTES  Learn to predict your moods. Do not let a bad situation be your excuse to have a cigarette. Some situations in your life might tempt you to have a cigarette.  Ask friends and co-workers not to smoke around you.  Make your home smoke-free.  Never have "just one" cigarette. It leads to wanting another and another. Remind yourself of your decision to quit.  On a card, make a list of your reasons for not smoking. Read it at least the same number of times a day as you have a cigarette. Tell yourself everyday, "I do not want to smoke. I choose not to smoke."  Ask someone at home or work to help you with your plan to quit smoking.  Have something planned after you eat or have a cup of coffee. Take a walk or get other exercise to perk you up. This will help to keep you from overeating.  Try a relaxation exercise to calm you down and decrease your stress. Remember, you may be tense and nervous the first two weeks after you quit. This will pass.  Find new activities to keep your hands busy. Play with a pen, coin, or rubber band. Doodle or draw things on paper.  Brush your teeth right after eating. This will help cut down the craving for the taste of tobacco after meals. You can try mouthwash too.  Try gum, breath mints, or diet candy to keep something in your mouth. IF YOU SMOKE AND WANT TO QUIT:  Do not stock up on cigarettes. Never buy a carton. Wait until one pack is finished before you buy another.  Never carry cigarettes with you at work or at home.  Keep cigarettes  as far away from you as possible. Leave them with someone else.  Never carry matches or a lighter with you.  Ask yourself, "Do I need this cigarette or is this just a reflex?"  Bet with someone that you can quit. Put cigarette money in a piggy bank every morning. If you smoke, you give up the money. If you do not smoke, by the end of the week, you keep the money.  Keep trying. It takes 21 days to change a habit!  Talk to your doctor about using medicines to help you quit. These include nicotine replacement gum, lozenges, or skin patches. Document Released: 06/03/2009 Document Revised: 10/30/2011 Document Reviewed: 06/03/2009 [employer] Patient Information 2014 [employer].  

## 2013-10-01 NOTE — Progress Notes (Signed)
Hematology and Oncology Follow Up Visit  David Conrad [id number] 10-Aug-1971 43 y.o. 10/01/2013   Principle Diagnosis:   Metastatic colon cancer-KRAS wild-type  Current Therapy:    Patient to start FOLFIRI with Avastin     Interim History:  David Conrad is comes in for followup. We last saw him back in November. He unfortunately has metastatic disease now. His CEA was going up. He underwent a biopsy. The biopsy confirmed recurrent colon cancer. Thankfully, he is still KRAS wild-type. His disease is in the abdomen. We recently did an MRI which did not show any liver metastases.  We sent him to [facility]. He saw Dr. Crisoforo Oxford. Dr.Shen do not think that intra-peritoneal chemotherapy was indicated and that systemic chemotherapy would be a reasonable approach.  David Conrad has been doing okay. He has occasional pain. The pain is over on the right side.  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 This mostly is in his toes.  His appetite been doing good. He's had no weight loss. There is no nausea vomiting. He's had no cough.  Overall, his performance status is excellent. Medications: Current outpatient prescriptions:ibuprofen (ADVIL,MOTRIN) 200 MG tablet, Take 200 mg by mouth every 6 (six) hours as needed., Disp: , Rfl: ;  UNABLE TO FIND, PT STATES HE STOPPED TAKING ALL OF HIS MEDICATION., Disp: , Rfl: ;  DULoxetine (CYMBALTA) 60 MG capsule, Take 1 capsule (60 mg total) by mouth daily., Disp: 30 capsule, Rfl: 3 guaiFENesin-codeine 100-10 MG/5ML syrup, Take 5 mLs by mouth 3 (three) times daily as needed for cough., Disp: 120 mL, Rfl: 0;  HYDROcodone-acetaminophen (NORCO/VICODIN) 5-325 MG per tablet, Take 1-2 tablets by mouth every 4 (four) hours as needed for moderate pain., Disp: 60 tablet, Rfl: 0;  lidocaine-prilocaine (EMLA) cream, Apply topically as needed., Disp: 30 g, Rfl: 3 Pyridoxine HCl (VITAMIN B-6) 250 MG tablet, Take 1 tablet (250 mg total) by mouth daily., Disp: 30  tablet, Rfl: 6  Allergies: No Known Allergies  Past Medical History, Surgical history, Social history, and Family History were reviewed and updated.  Review of Systems: As above  Physical Exam:  height is 5' 7"  (1.702 m) and weight is 225 lb (102.059 kg). His oral temperature is 98.2 F (36.8 C). His blood pressure is 128/81 and his pulse is 91. His respiration is 18.   Well-developed well-nourished African American gentleman in no obvious distress. His head and neck exam shows no scleral icterus. There is no adenopathy in the neck. He There lungs are clear. Cardiac exam regular rate rhythm with no murmurs rubs or bruits. Abdomen soft. Has a laparotomy scar. This is well-healed. There is no fluid wave. There is no subcutaneous masses. He's had no palpable liver or spleen tip. Extremities shows no clubbing cyanosis or edema. Has excellent strength in his legs. Has good pulses in his distal extremities. Back exam no tenderness this over the spine ribs or hips. Neurological exam no focal neurological deficits.   Lab Results  Component Value Date   WBC 6.2 10/01/2013   HGB 13.4 10/01/2013   HCT 41.6 10/01/2013   MCV 79* 10/01/2013   PLT 311 10/01/2013     Chemistry      Component Value Date/Time   NA 140 08/12/2013 1046   NA 144 02/03/2013 0803   K 4.1 08/12/2013 1046   K 4.6 02/03/2013 0803   CL 105 08/12/2013 1046   CL 104 02/03/2013 0803   CO2 25 08/12/2013 1046   CO2 25  02/03/2013 0803   BUN 10 08/12/2013 1046   BUN 19 02/03/2013 0803   CREATININE 0.94 08/12/2013 1046   CREATININE 0.9 02/03/2013 0803      Component Value Date/Time   CALCIUM 9.4 08/12/2013 1046   CALCIUM 10.1 02/03/2013 0803   ALKPHOS 83 08/12/2013 1046   ALKPHOS 71 02/03/2013 0803   AST 15 08/12/2013 1046   AST 24 02/03/2013 0803   ALT 15 08/12/2013 1046   ALT 30 02/03/2013 0803   BILITOT 0.3 08/12/2013 1046   BILITOT 0.60 02/03/2013 0803      his last CEA level from December and is 8.7   Impression and  Plan: David Conrad  43 year old gentleman. He had initially stage IIIc colon cancer. He had 6 positive lymph nodes. He underwent systemic chemotherapy with FOLFOX. He subsequently relapsed. He relapsed probably about a year after his treatment.  We will go ahead and try FOLFIRI with Avastin. I don't see any reason why we cannot use this.  I went over the side effects with him. He understands all this.  We'll start him next week.  We will go with 4 cycles of treatment and then reevaluate with our scans.  Ultimately, I still think that intraperitoneal therapy with debulking would be in his best chance for long-term survival. He just is not ready to take that step yet.  I spent a good 45 minutes with him today. We talked a lot about what's going on. He's had a lot going on at home. He's trying to" keep everything together"   Volanda Napoleon, MD 2/11/201511:30 AM

## 2013-10-08 ENCOUNTER — Telehealth: Payer: Self-pay | Admitting: Hematology & Oncology

## 2013-10-08 ENCOUNTER — Ambulatory Visit: Payer: Medicaid Other

## 2013-10-08 ENCOUNTER — Other Ambulatory Visit: Payer: Medicaid Other | Admitting: Lab

## 2013-10-08 NOTE — Telephone Encounter (Signed)
Pt called said ride didn't show up. Wouldn't reschedule, he also cx 2-25. He said he would come to 3-4 appointment. RN is aware and will let MD know

## 2013-10-15 ENCOUNTER — Ambulatory Visit: Payer: Medicaid Other

## 2013-10-22 ENCOUNTER — Ambulatory Visit (HOSPITAL_BASED_OUTPATIENT_CLINIC_OR_DEPARTMENT_OTHER): Payer: Medicaid Other | Admitting: Hematology & Oncology

## 2013-10-22 ENCOUNTER — Encounter: Payer: Self-pay | Admitting: Hematology & Oncology

## 2013-10-22 ENCOUNTER — Ambulatory Visit: Payer: Medicaid Other

## 2013-10-22 ENCOUNTER — Other Ambulatory Visit (HOSPITAL_BASED_OUTPATIENT_CLINIC_OR_DEPARTMENT_OTHER): Payer: Medicaid Other | Admitting: Lab

## 2013-10-22 VITALS — BP 124/80 | HR 101 | Temp 98.0°F | Resp 18 | Ht 67.0 in | Wt 223.0 lb

## 2013-10-22 DIAGNOSIS — C779 Secondary and unspecified malignant neoplasm of lymph node, unspecified: Secondary | ICD-10-CM

## 2013-10-22 DIAGNOSIS — C182 Malignant neoplasm of ascending colon: Secondary | ICD-10-CM

## 2013-10-22 DIAGNOSIS — G4701 Insomnia due to medical condition: Secondary | ICD-10-CM

## 2013-10-22 DIAGNOSIS — F064 Anxiety disorder due to known physiological condition: Secondary | ICD-10-CM

## 2013-10-22 LAB — CMP (CANCER CENTER ONLY)
ALT(SGPT): 16 U/L (ref 10–47)
AST: 14 U/L (ref 11–38)
Albumin: 3.4 g/dL (ref 3.3–5.5)
Alkaline Phosphatase: 85 U/L — ABNORMAL HIGH (ref 26–84)
BUN: 11 mg/dL (ref 7–22)
CALCIUM: 9.2 mg/dL (ref 8.0–10.3)
CHLORIDE: 103 meq/L (ref 98–108)
CO2: 30 mEq/L (ref 18–33)
Creat: 0.8 mg/dl (ref 0.6–1.2)
GLUCOSE: 101 mg/dL (ref 73–118)
Potassium: 3.5 mEq/L (ref 3.3–4.7)
Sodium: 139 mEq/L (ref 128–145)
Total Bilirubin: 0.5 mg/dl (ref 0.20–1.60)
Total Protein: 7.4 g/dL (ref 6.4–8.1)

## 2013-10-22 LAB — CBC WITH DIFFERENTIAL (CANCER CENTER ONLY)
BASO#: 0 10*3/uL (ref 0.0–0.2)
BASO%: 0.3 % (ref 0.0–2.0)
EOS%: 4.2 % (ref 0.0–7.0)
Eosinophils Absolute: 0.3 10*3/uL (ref 0.0–0.5)
HEMATOCRIT: 40.8 % (ref 38.7–49.9)
HGB: 13.1 g/dL (ref 13.0–17.1)
LYMPH#: 2 10*3/uL (ref 0.9–3.3)
LYMPH%: 28.6 % (ref 14.0–48.0)
MCH: 25.2 pg — ABNORMAL LOW (ref 28.0–33.4)
MCHC: 32.1 g/dL (ref 32.0–35.9)
MCV: 79 fL — AB (ref 82–98)
MONO#: 1 10*3/uL — AB (ref 0.1–0.9)
MONO%: 15.2 % — ABNORMAL HIGH (ref 0.0–13.0)
NEUT#: 3.5 10*3/uL (ref 1.5–6.5)
NEUT%: 51.7 % (ref 40.0–80.0)
Platelets: 306 10*3/uL (ref 145–400)
RBC: 5.2 10*6/uL (ref 4.20–5.70)
RDW: 14.4 % (ref 11.1–15.7)
WBC: 6.8 10*3/uL (ref 4.0–10.0)

## 2013-10-22 MED ORDER — CLORAZEPATE DIPOTASSIUM 3.75 MG PO TABS: ORAL_TABLET | ORAL | Status: DC

## 2013-10-22 MED ORDER — ZOLPIDEM TARTRATE ER 12.5 MG PO TBCR: 12.5000 mg | EXTENDED_RELEASE_TABLET | Freq: Every evening | ORAL | Status: DC | PRN

## 2013-10-23 LAB — LACTATE DEHYDROGENASE: LDH: 162 U/L (ref 94–250)

## 2013-10-23 LAB — CEA: CEA: 43 ng/mL — ABNORMAL HIGH (ref 0.0–5.0)

## 2013-10-23 NOTE — Progress Notes (Signed)
  DIAGNOSIS: Metastatic colon cancer-KRAS wild-type  CURRENT THERAPY:  Trying to start systemic chemotherapy with FOLFIRI with Avastin. Patient does not want to start because of issues with his family   INTERIM HISTORY:  David Conrad is in. He is supposed to start chemotherapy today. However, there is more issues with his family. His son apparently is having to go to juvenile detention and therapy because of behavioral issues. This is caused a lot of problems for he and his wife. He does is nothing that he's ready for any chemotherapy.    He really does not hurt. His appetite is okay. There is no bleeding. There is no fever. There is no nausea vomiting. There is no cough. There is no leg swelling. There's no rashes.    His last CEA was 23.    His last scans did not show any hepatic disease. He does had disease in his abdomen.   PHYSICAL EXAMINATION:  Well-developed well-nourished gentleman. Vital signs temperature 90.8. Pulse 101. Blood pressure 124/80. Weight 223 pounds. No adenopathy in the neck. No scleral icterus. No oral lesion. Lungs clear. Cardiac regular rate rhythm. Abdomen soft. Well-healed laparotomy. No fluid wave. No palpable liver. No abdominal mass. No guarding or rebound tenderness. Extremities unremarkable. Lungs clear. Back negative. Neurological negative.   LABORATORY STUDIES:  CEA 43   IMPRESSION: 43 year old gentleman. He initially had stage IIIc colon cancer. He was treated with adjuvant chemotherapy. He then had recurrence. This was biopsied. He did not want intraperitoneal therapy. We sent him to [facility].  Unfortunately, his doses not mentally ready for chemotherapy. We really have to try to get him started soon. His CEA is going up.  Thankfully ly the rest of his labs are okay.  We'll try to get him back in 3 weeks. Hopefully we can treat him then.  I spent 45 minutes with him. We will try to give him assistance with transportation. We'll  see about any counseling or support groups for he and his wife about their son. I feel bad for him. He is trying his best but has a lot of family issues that really are causing a lot of distress.       Volanda Napoleon, MD 10/23/2013

## 2013-10-24 ENCOUNTER — Other Ambulatory Visit: Payer: Self-pay | Admitting: *Deleted

## 2013-10-24 ENCOUNTER — Telehealth: Payer: Self-pay | Admitting: Hematology & Oncology

## 2013-10-24 NOTE — Telephone Encounter (Signed)
Left message with 3-25 appointment time

## 2013-10-24 NOTE — Progress Notes (Signed)
Was approached by Dr. Marin Olp to find assistance for patient who has transportation issues as well as issues at home that have been extremely stressful.  Called social worker Lauren at the cancer center and she recommended a support group for patient that meets on Mondays at 6-8 on the 3rd Monday of the month. I also gave patient Medicaid Transportation number [phone] and [phone].  Told patiient to call them 3-4 days in advance.  Lauren said that she would have another Education officer, museum call patient next week to see if there are some specific ways they could help patient. Told patient to call us if he has not heard from them.  Patient assured me that he would

## 2013-10-28 ENCOUNTER — Telehealth: Payer: Self-pay | Admitting: Hematology & Oncology

## 2013-10-28 ENCOUNTER — Encounter: Payer: Self-pay | Admitting: *Deleted

## 2013-10-28 NOTE — Telephone Encounter (Signed)
Left message to please call and confirm they are aware of 3-25 appointment

## 2013-10-28 NOTE — Progress Notes (Signed)
[facility]  Clinical Social Work was referred by Medical sales representative for assessment of psychosocial needs.  Clinical Social Worker contacted patient at home to offer support and assess for needs.  Pt expressed feeling overwhelmed due to his medical needs, transportation, the needs of his family, and financial concerns.  CSW validated pt's feelings and discussed available resources.  Pt stated he had not yet called Medicaid transportation, but planned to do so today.  CSw and pt also discussed SCAT and other financial resources.  CSW will mail SCAT application and information on support groups at [facility].  CSW also completed the financial application [employer].  CSW and pt dicussed the importance of emotional support and pt expressed interest in the GI support group which meets monthly.  CSW contacted Baxter Flattery at [employer] to update and inquire about possible [employer].  Baxter Flattery will follow up with pt regarding eligibility for the grant.  CSW encouraged pt to call with any additional questions or concerns.  Johnnye Lana, MSW, [place] Worker [facility] [phone]

## 2013-11-12 ENCOUNTER — Other Ambulatory Visit (HOSPITAL_BASED_OUTPATIENT_CLINIC_OR_DEPARTMENT_OTHER): Payer: Medicaid Other | Admitting: Lab

## 2013-11-12 ENCOUNTER — Ambulatory Visit (HOSPITAL_BASED_OUTPATIENT_CLINIC_OR_DEPARTMENT_OTHER): Payer: Medicaid Other | Admitting: Hematology & Oncology

## 2013-11-12 ENCOUNTER — Encounter: Payer: Self-pay | Admitting: Hematology & Oncology

## 2013-11-12 ENCOUNTER — Telehealth: Payer: Self-pay | Admitting: Hematology & Oncology

## 2013-11-12 ENCOUNTER — Ambulatory Visit (HOSPITAL_BASED_OUTPATIENT_CLINIC_OR_DEPARTMENT_OTHER): Payer: Medicaid Other

## 2013-11-12 VITALS — BP 120/76 | HR 94 | Temp 97.6°F | Resp 20 | Ht 69.0 in | Wt 223.0 lb

## 2013-11-12 VITALS — BP 120/76 | HR 94 | Temp 97.6°F | Resp 20

## 2013-11-12 DIAGNOSIS — Z5111 Encounter for antineoplastic chemotherapy: Secondary | ICD-10-CM

## 2013-11-12 DIAGNOSIS — Z5112 Encounter for antineoplastic immunotherapy: Secondary | ICD-10-CM

## 2013-11-12 DIAGNOSIS — C182 Malignant neoplasm of ascending colon: Secondary | ICD-10-CM

## 2013-11-12 LAB — CBC WITH DIFFERENTIAL (CANCER CENTER ONLY)
BASO#: 0 10*3/uL (ref 0.0–0.2)
BASO%: 0.4 % (ref 0.0–2.0)
EOS%: 5.5 % (ref 0.0–7.0)
Eosinophils Absolute: 0.3 10*3/uL (ref 0.0–0.5)
HEMATOCRIT: 42.2 % (ref 38.7–49.9)
HEMOGLOBIN: 13.6 g/dL (ref 13.0–17.1)
LYMPH#: 1.9 10*3/uL (ref 0.9–3.3)
LYMPH%: 32.9 % (ref 14.0–48.0)
MCH: 25.3 pg — ABNORMAL LOW (ref 28.0–33.4)
MCHC: 32.2 g/dL (ref 32.0–35.9)
MCV: 78 fL — ABNORMAL LOW (ref 82–98)
MONO#: 0.6 10*3/uL (ref 0.1–0.9)
MONO%: 10.7 % (ref 0.0–13.0)
NEUT#: 2.8 10*3/uL (ref 1.5–6.5)
NEUT%: 50.5 % (ref 40.0–80.0)
Platelets: 311 10*3/uL (ref 145–400)
RBC: 5.38 10*6/uL (ref 4.20–5.70)
RDW: 13.9 % (ref 11.1–15.7)
WBC: 5.6 10*3/uL (ref 4.0–10.0)

## 2013-11-12 LAB — UA PROTEIN, DIPSTICK - CHCC SATELLITE: Protein, Urine: <30 mg/dL

## 2013-11-12 LAB — CMP (CANCER CENTER ONLY)
ALBUMIN: 3.3 g/dL (ref 3.3–5.5)
ALK PHOS: 81 U/L (ref 26–84)
ALT: 17 U/L (ref 10–47)
AST: 19 U/L (ref 11–38)
BUN, Bld: 12 mg/dL (ref 7–22)
CO2: 29 mEq/L (ref 18–33)
Calcium: 9.7 mg/dL (ref 8.0–10.3)
Chloride: 101 mEq/L (ref 98–108)
Creat: 0.8 mg/dl (ref 0.6–1.2)
GLUCOSE: 148 mg/dL — AB (ref 73–118)
POTASSIUM: 3.7 meq/L (ref 3.3–4.7)
SODIUM: 138 meq/L (ref 128–145)
Total Bilirubin: 0.5 mg/dl (ref 0.20–1.60)
Total Protein: 7.4 g/dL (ref 6.4–8.1)

## 2013-11-12 LAB — LACTATE DEHYDROGENASE: LDH: 136 U/L (ref 94–250)

## 2013-11-12 LAB — CEA: CEA: 56.8 ng/mL — ABNORMAL HIGH (ref 0.0–5.0)

## 2013-11-12 MED ORDER — BEVACIZUMAB CHEMO INJECTION 400 MG/16ML
5.0000 mg/kg | Freq: Once | INTRAVENOUS | Status: AC
  Administered 2013-11-12: 500 mg via INTRAVENOUS
  Filled 2013-11-12: qty 20

## 2013-11-12 MED ORDER — FLUOROURACIL CHEMO INJECTION 2.5 GM/50ML
400.0000 mg/m2 | Freq: Once | INTRAVENOUS | Status: AC
  Administered 2013-11-12: 900 mg via INTRAVENOUS
  Filled 2013-11-12: qty 18

## 2013-11-12 MED ORDER — LEUCOVORIN CALCIUM INJECTION 350 MG
400.0000 mg/m2 | Freq: Once | INTRAVENOUS | Status: AC
  Administered 2013-11-12: 876 mg via INTRAVENOUS
  Filled 2013-11-12: qty 43.8

## 2013-11-12 MED ORDER — LOPERAMIDE HCL 2 MG PO TABS: ORAL_TABLET | ORAL | Status: DC

## 2013-11-12 MED ORDER — ONDANSETRON 16 MG/50ML IVPB (CHCC)
INTRAVENOUS | Status: AC
  Filled 2013-11-12: qty 16

## 2013-11-12 MED ORDER — PROCHLORPERAZINE MALEATE 10 MG PO TABS: 10.0000 mg | ORAL_TABLET | Freq: Four times a day (QID) | ORAL | Status: DC | PRN

## 2013-11-12 MED ORDER — ONDANSETRON 16 MG/50ML IVPB (CHCC)
16.0000 mg | Freq: Once | INTRAVENOUS | Status: AC
  Administered 2013-11-12: 16 mg via INTRAVENOUS

## 2013-11-12 MED ORDER — LORAZEPAM 2 MG/ML IJ SOLN
0.5000 mg | Freq: Once | INTRAMUSCULAR | Status: AC
  Administered 2013-11-12: 0.5 mg via INTRAVENOUS

## 2013-11-12 MED ORDER — SODIUM CHLORIDE 0.9 % IJ SOLN
10.0000 mL | INTRAMUSCULAR | Status: DC | PRN
  Filled 2013-11-12: qty 10

## 2013-11-12 MED ORDER — DEXAMETHASONE 4 MG PO TABS: 8.0000 mg | ORAL_TABLET | Freq: Two times a day (BID) | ORAL | Status: DC

## 2013-11-12 MED ORDER — DEXAMETHASONE SODIUM PHOSPHATE 20 MG/5ML IJ SOLN
INTRAMUSCULAR | Status: AC
  Filled 2013-11-12: qty 5

## 2013-11-12 MED ORDER — SODIUM CHLORIDE 0.9 % IV SOLN
2400.0000 mg/m2 | INTRAVENOUS | Status: DC
  Administered 2013-11-12: 5250 mg via INTRAVENOUS
  Filled 2013-11-12: qty 105

## 2013-11-12 MED ORDER — DEXAMETHASONE SODIUM PHOSPHATE 20 MG/5ML IJ SOLN
20.0000 mg | Freq: Once | INTRAMUSCULAR | Status: AC
  Administered 2013-11-12: 20 mg via INTRAVENOUS

## 2013-11-12 MED ORDER — LORAZEPAM 0.5 MG PO TABS: 0.5000 mg | ORAL_TABLET | Freq: Four times a day (QID) | ORAL | Status: DC | PRN

## 2013-11-12 MED ORDER — IRINOTECAN HCL CHEMO INJECTION 100 MG/5ML
162.0000 mg/m2 | Freq: Once | INTRAVENOUS | Status: AC
  Administered 2013-11-12: 354 mg via INTRAVENOUS
  Filled 2013-11-12: qty 5.9

## 2013-11-12 MED ORDER — LORAZEPAM 2 MG/ML IJ SOLN
INTRAMUSCULAR | Status: AC
  Filled 2013-11-12: qty 1

## 2013-11-12 MED ORDER — ONDANSETRON HCL 8 MG PO TABS: 8.0000 mg | ORAL_TABLET | Freq: Two times a day (BID) | ORAL | Status: DC

## 2013-11-12 MED ORDER — ATROPINE SULFATE 1 MG/ML IJ SOLN
INTRAMUSCULAR | Status: AC
  Filled 2013-11-12: qty 1

## 2013-11-12 MED ORDER — HEPARIN SOD (PORK) LOCK FLUSH 100 UNIT/ML IV SOLN
500.0000 [IU] | Freq: Once | INTRAVENOUS | Status: DC | PRN
  Filled 2013-11-12: qty 5

## 2013-11-12 MED ORDER — SODIUM CHLORIDE 0.9 % IV SOLN
Freq: Once | INTRAVENOUS | Status: AC
Start: 2013-11-12 — End: 2013-11-12
  Administered 2013-11-12: 09:00:00 via INTRAVENOUS

## 2013-11-12 MED ORDER — ATROPINE SULFATE 1 MG/ML IJ SOLN
0.5000 mg | Freq: Once | INTRAMUSCULAR | Status: AC | PRN
  Administered 2013-11-12: 0.5 mg via INTRAVENOUS

## 2013-11-12 NOTE — Progress Notes (Signed)
Reassessed pt after ativan administration - patient reports eye twitching is "easing off." dph

## 2013-11-12 NOTE — Progress Notes (Signed)
Patient reports eyes "twitching" during chemo. Chemo paused & Dr Marin Olp aware. Ativan 0.5mg  given as ordered. Flushed line before and after IV push. Dr Marin Olp to evaluate patient. dph

## 2013-11-12 NOTE — Telephone Encounter (Signed)
Per MD he wants to see pt on 4-15 to see how he is doing. MD aware chemo not scheduled

## 2013-11-12 NOTE — Patient Instructions (Addendum)
Winter Discharge Instructions for Patients Receiving Chemotherapy  Today you received the following chemotherapy agents Irinotecan To help prevent nausea and vomiting after your treatment, we encourage you to take your nausea medication   ZofranTake 1 tablet (8 mg total) by mouth 2 (two) times daily. Start the day after chemo for 3 days. Then take as needed for nausea or vomiting.    DecadronTake 2 tablets (8 mg total) by mouth 2 (two) times daily with a meal. Start the day after chemotherapy for 3 days. Take with food.  Compazine Take 1 tablet (10 mg total) by mouth every 6 (six) hours as needed (Nausea or vomiting   Ativan Take 1 tablet (0.5 mg total) by mouth every 6 (six) hours as needed for anxiety.   Imodium  Take 2 at onset of diarrhea, then 1 every 2hrs until 12hr without a BM. May take 2 tab every 4hrs at bedtime. If diarrhea recurs repeat.      If you develop nausea and vomiting that is not controlled by your nausea medication, call the clinic.   BELOW ARE SYMPTOMS THAT SHOULD BE REPORTED IMMEDIATELY:  *FEVER GREATER THAN 100.5 F  *CHILLS WITH OR WITHOUT FEVER  NAUSEA AND VOMITING THAT IS NOT CONTROLLED WITH YOUR NAUSEA MEDICATION  *UNUSUAL SHORTNESS OF BREATH  *UNUSUAL BRUISING OR BLEEDING  TENDERNESS IN MOUTH AND THROAT WITH OR WITHOUT PRESENCE OF ULCERS  *URINARY PROBLEMS  *BOWEL PROBLEMS  UNUSUAL RASH Items with * indicate a potential emergency and should be followed up as soon as possible.  Feel free to call the clinic you have any questions or concerns. The clinic phone number is [phone].      Bevacizumab injection What is this medicine? BEVACIZUMAB (be va SIZ yoo mab) is a chemotherapy drug. It targets a protein found in many cancer cell types, and halts cancer growth. This drug treats many cancers including non-small cell lung cancer, and colon or rectal cancer. It is usually given  with other chemotherapy drugs. This medicine may be used for other purposes; ask your health care provider or pharmacist if you have questions. COMMON BRAND NAME(S): Avastin What should I tell my health care provider before I take this medicine? They need to know if you have any of these conditions: -blood clots -heart disease, including heart failure, heart attack, or chest pain (angina) -high blood pressure -infection (especially a virus infection such as chickenpox, cold sores, or herpes) -kidney disease -lung disease -prior chemotherapy with doxorubicin, daunorubicin, epirubicin, or other anthracycline type chemotherapy agents -recent or ongoing radiation therapy -recent surgery -stroke -an unusual or allergic reaction to bevacizumab, hamster proteins, mouse proteins, other medicines, foods, dyes, or preservatives -pregnant or trying to get pregnant -breast-feeding How should I use this medicine? This medicine is for infusion into a vein. It is given by a health care professional in a hospital or clinic setting. Talk to your pediatrician regarding the use of this medicine in children. Special care may be needed. Overdosage: If you think you have taken too much of this medicine contact a poison control center or emergency room at once. NOTE: This medicine is only for you. Do not share this medicine with others. What if I miss a dose? It is important not to miss your dose. Call your doctor or health care professional if you are unable to keep an appointment. What may interact  with this medicine? Interactions are not expected. This list may not describe all possible interactions. Give your health care provider a list of all the medicines, herbs, non-prescription drugs, or dietary supplements you use. Also tell them if you smoke, drink alcohol, or use illegal drugs. Some items may interact with your medicine. What should I watch for while using this medicine? Your condition will be  monitored carefully while you are receiving this medicine. You will need important blood work and urine testing done while you are taking this medicine. During your treatment, let your health care professional know if you have any unusual symptoms, such as difficulty breathing. This medicine may rarely cause 'gastrointestinal perforation' (holes in the stomach, intestines or colon), a serious side effect requiring surgery to repair. This medicine should be started at least 28 days following major surgery and the site of the surgery should be totally healed. Check with your doctor before scheduling dental work or surgery while you are receiving this treatment. Talk to your doctor if you have recently had surgery or if you have a wound that has not healed. Do not become pregnant while taking this medicine. Women should inform their doctor if they wish to become pregnant or think they might be pregnant. There is a potential for serious side effects to an unborn child. Talk to your health care professional or pharmacist for more information. Do not breast-feed an infant while taking this medicine. This medicine has caused ovarian failure in some women. This medicine may interfere with the ability to have a child. You should talk to your doctor or health care professional if you are concerned about your fertility. What side effects may I notice from receiving this medicine? Side effects that you should report to your doctor or health care professional as soon as possible: -allergic reactions like skin rash, itching or hives, swelling of the face, lips, or tongue -signs of infection - fever or chills, cough, sore throat, pain or trouble passing urine -signs of decreased platelets or bleeding - bruising, pinpoint red spots on the skin, black, tarry stools, nosebleeds, blood in the urine -breathing problems -changes in vision -chest pain -confusion -jaw pain, especially after dental work -mouth  sores -seizures -severe abdominal pain -severe headache -sudden numbness or weakness of the face, arm or leg -swelling of legs or ankles -symptoms of a stroke: change in mental awareness, inability to talk or move one side of the body (especially in patients with lung cancer) -trouble passing urine or change in the amount of urine -trouble speaking or understanding -trouble walking, dizziness, loss of balance or coordination Side effects that usually do not require medical attention (report to your doctor or health care professional if they continue or are bothersome): -constipation -diarrhea -dry skin -headache -loss of appetite -nausea, vomiting This list may not describe all possible side effects. Call your doctor for medical advice about side effects. You may report side effects to FDA at 1-800-FDA-1088. Where should I keep my medicine? This drug is given in a hospital or clinic and will not be stored at home. NOTE: This sheet is a summary. It may not cover all possible information. If you have questions about this medicine, talk to your doctor, pharmacist, or health care provider.  2014, Elsevier/Gold Standard. (2010-07-08 16:25:37) Irinotecan injection What is this medicine? IRINOTECAN (ir in oh TEE kan ) is a chemotherapy drug. It is used to treat colon and rectal cancer. This medicine may be used for other purposes; ask your  health care provider or pharmacist if you have questions. COMMON BRAND NAME(S): Camptosar What should I tell my health care provider before I take this medicine? They need to know if you have any of these conditions: -blood disorders -dehydration -diarrhea -infection (especially a virus infection such as chickenpox, cold sores, or herpes) -liver disease -low blood counts, like low white cell, platelet, or red cell counts -recent or ongoing radiation therapy -an unusual or allergic reaction to irinotecan, sorbitol, other chemotherapy, other medicines,  foods, dyes, or preservatives -pregnant or trying to get pregnant -breast-feeding How should I use this medicine? This drug is given as an infusion into a vein. It is administered in a hospital or clinic by a specially trained health care professional. Talk to your pediatrician regarding the use of this medicine in children. Special care may be needed. Overdosage: If you think you have taken too much of this medicine contact a poison control center or emergency room at once. NOTE: This medicine is only for you. Do not share this medicine with others. What if I miss a dose? It is important not to miss your dose. Call your doctor or health care professional if you are unable to keep an appointment. What may interact with this medicine? Do not take this medicine with any of the following medications: -atazanavir -ketoconazole -St. John's Wort This medicine may also interact with the following medications: -dexamethasone -diuretics -laxatives -medicines for seizures like carbamazepine, mephobarbital, phenobarbital, phenytoin, primidone -medicines to increase blood counts like filgrastim, pegfilgrastim, sargramostim -prochlorperazine -vaccines This list may not describe all possible interactions. Give your health care provider a list of all the medicines, herbs, non-prescription drugs, or dietary supplements you use. Also tell them if you smoke, drink alcohol, or use illegal drugs. Some items may interact with your medicine. What should I watch for while using this medicine? Your condition will be monitored carefully while you are receiving this medicine. You will need important blood work done while you are taking this medicine. This drug may make you feel generally unwell. This is not uncommon, as chemotherapy can affect healthy cells as well as cancer cells. Report any side effects. Continue your course of treatment even though you feel ill unless your doctor tells you to stop. In some  cases, you may be given additional medicines to help with side effects. Follow all directions for their use. You may get drowsy or dizzy. Do not drive, use machinery, or do anything that needs mental alertness until you know how this medicine affects you. Do not stand or sit up quickly, especially if you are an older patient. This reduces the risk of dizzy or fainting spells. Call your doctor or health care professional for advice if you get a fever, chills or sore throat, or other symptoms of a cold or flu. Do not treat yourself. This drug decreases your body's ability to fight infections. Try to avoid being around people who are sick. This medicine may increase your risk to bruise or bleed. Call your doctor or health care professional if you notice any unusual bleeding. Be careful brushing and flossing your teeth or using a toothpick because you may get an infection or bleed more easily. If you have any dental work done, tell your dentist you are receiving this medicine. Avoid taking products that contain aspirin, acetaminophen, ibuprofen, naproxen, or ketoprofen unless instructed by your doctor. These medicines may hide a fever. Do not become pregnant while taking this medicine. Women should inform their doctor if  they wish to become pregnant or think they might be pregnant. There is a potential for serious side effects to an unborn child. Talk to your health care professional or pharmacist for more information. Do not breast-feed an infant while taking this medicine. What side effects may I notice from receiving this medicine? Side effects that you should report to your doctor or health care professional as soon as possible: -allergic reactions like skin rash, itching or hives, swelling of the face, lips, or tongue -low blood counts - this medicine may decrease the number of white blood cells, red blood cells and platelets. You may be at increased risk for infections and bleeding. -signs of infection  - fever or chills, cough, sore throat, pain or difficulty passing urine -signs of decreased platelets or bleeding - bruising, pinpoint red spots on the skin, black, tarry stools, blood in the urine -signs of decreased red blood cells - unusually weak or tired, fainting spells, lightheadedness -breathing problems -chest pain -diarrhea -feeling faint or lightheaded, falls -flushing, runny nose, sweating during infusion -mouth sores or pain -pain, swelling, redness or irritation where injected -pain, swelling, warmth in the leg -pain, tingling, numbness in the hands or feet -problems with balance, talking, walking -stomach cramps, pain -trouble passing urine or change in the amount of urine -vomiting as to be unable to hold down drinks or food -yellowing of the eyes or skin Side effects that usually do not require medical attention (report to your doctor or health care professional if they continue or are bothersome): -constipation -hair loss -headache -loss of appetite -nausea, vomiting -stomach upset This list may not describe all possible side effects. Call your doctor for medical advice about side effects. You may report side effects to FDA at 1-800-FDA-1088. Where should I keep my medicine? This drug is given in a hospital or clinic and will not be stored at home. NOTE: This sheet is a summary. It may not cover all possible information. If you have questions about this medicine, talk to your doctor, pharmacist, or health care provider.  2014, Elsevier/Gold Standard. (2007-12-24 16:29:12)

## 2013-11-13 NOTE — Progress Notes (Signed)
Hematology and Oncology Follow Up Visit  David Conrad [id number] 16-Aug-1971 42 y.o. 11/13/2013    Principle Diagnosis:   Metastatic colon cancer-KRAS wild-type  Current Therapy:   Patient to start systemic chemotherapy with FOLFIRI with Avastin    Interim History:  Mr.  Conrad is back for followup. We finally got him to start chemotherapy. He really has had a very difficult time. He has had a lot of family issues that really have prevented him from doing treatment. Finally, things have settled down a little bit. He still is very emotional. It has been very tough on him. He is young. He has a aggressive disease.  He has been seen at [facility]. We referred him for intra-peritoneal chemotherapy. He did not feel like he can Helbig potential side effects right now.  For now, his disease seems to be outside of his liver and in his abdomen. His CEA is 57.  His appetites been okay. He's had no nausea vomiting. 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 He is going to the bathroom okay.  Overall, his performance status is ECOG 1.  Medications: Current outpatient prescriptions:clorazepate (TRANXENE-T) 3.75 MG tablet, Take 1-2 pills, IF NEEDED, every 8 hrs for anxiety, Disp: 60 tablet, Rfl: 3;  dexamethasone (DECADRON) 4 MG tablet, Take 2 tablets (8 mg total) by mouth 2 (two) times daily with a meal. Start the day after chemotherapy for 3 days. Take with food., Disp: 30 tablet, Rfl: 1 ibuprofen (ADVIL,MOTRIN) 200 MG tablet, Take 200 mg by mouth every 6 (six) hours as needed., Disp: , Rfl: ;  lidocaine-prilocaine (EMLA) cream, Apply topically as needed., Disp: 30 g, Rfl: 3;  LORazepam (ATIVAN) 0.5 MG tablet, Take 1 tablet (0.5 mg total) by mouth every 6 (six) hours as needed for anxiety., Disp: 30 tablet, Rfl: 0 ondansetron (ZOFRAN) 8 MG tablet, Take 1 tablet (8 mg total) by mouth 2 (two) times daily. Start the day after chemo for 3 days. Then take as needed for nausea or vomiting., Disp: 30 tablet,  Rfl: 1;  zolpidem (AMBIEN CR) 12.5 MG CR tablet, Take 1 tablet (12.5 mg total) by mouth at bedtime as needed for sleep., Disp: 30 tablet, Rfl: 2 loperamide (IMODIUM A-D) 2 MG tablet, Take 2 at onset of diarrhea, then 1 every 2hrs until 12hr without a BM. May take 2 tab every 4hrs at bedtime. If diarrhea recurs repeat., Disp: 100 tablet, Rfl: 1;  prochlorperazine (COMPAZINE) 10 MG tablet, Take 1 tablet (10 mg total) by mouth every 6 (six) hours as needed (Nausea or vomiting)., Disp: 30 tablet, Rfl: 1 Pyridoxine HCl (VITAMIN B-6) 250 MG tablet, Take 1 tablet (250 mg total) by mouth daily., Disp: 30 tablet, Rfl: 6  Allergies: No Known Allergies  Past Medical History, Surgical history, Social history, and Family History were reviewed and updated.  Review of Systems: As above  Physical Exam:  height is _0  (1.753 m) and weight is 223 lb (101.152 kg). His oral temperature is 97.6 F (36.4 C). His blood pressure is 120/76 and his pulse is 94. His respiration is 20.   Well-developed and well-nourished gentleman. His head and neck exam shows no adenopathy. There is no scleral icterus. He has no oral lesions. Lungs are clear. Cardiac exam regular in rhythm with no murmurs rubs or bruits. Abdomen soft. Has well-healed laparotomy scar. There is no fluid wave. There is no abdominal mass. There is no palpable liver or spleen tip. Back exam shows no tenderness over the spine ribs or  hips. Extremities shows no clubbing cyanosis or edema. Has good strength in his arms legs. Skin exam no rashes. Neurological exam is nonfocal.  Lab Results  Component Value Date   WBC 5.6 11/12/2013   HGB 13.6 11/12/2013   HCT 42.2 11/12/2013   MCV 78* 11/12/2013   PLT 311 11/12/2013     Chemistry      Component Value Date/Time   NA 138 11/12/2013 0809   NA 141 10/01/2013 0935   K 3.7 11/12/2013 0809   K 4.2 10/01/2013 0935   CL 101 11/12/2013 0809   CL 106 10/01/2013 0935   CO2 29 11/12/2013 0809   CO2 25 10/01/2013 0935   BUN  12 11/12/2013 0809   BUN 13 10/01/2013 0935   CREATININE 0.8 11/12/2013 0809   CREATININE 0.95 10/01/2013 0935      Component Value Date/Time   CALCIUM 9.7 11/12/2013 0809   CALCIUM 9.4 10/01/2013 0935   ALKPHOS 81 11/12/2013 0809   ALKPHOS 80 10/01/2013 0935   AST 19 11/12/2013 0809   AST 14 10/01/2013 0935   ALT 17 11/12/2013 0809   ALT 15 10/01/2013 0935   BILITOT 0.50 11/12/2013 0809   BILITOT 0.3 10/01/2013 0935         Impression and Plan: David Conrad is 43 year old African American gentleman. He had initially stage IIIc colon cancer. He received systemic chemotherapy. He enforceably relapsed.  We will start systemic therapy. Again, we will go with FOLFIRI/Avastin. I went over the side effects with him. We gave him information sheets. He understands the side effects. We want to try to be aggressive with nausea vomiting for him.  We will go with 4 cycles of treatment and see how he does in response.  I want to see him back in 3 weeks. We will see how he is doing.  Again, he is very emotional. We are trying provide as much support as possible for him and his family.   Volanda Napoleon, MD 3/26/20155:49 PM

## 2013-11-14 ENCOUNTER — Ambulatory Visit (HOSPITAL_BASED_OUTPATIENT_CLINIC_OR_DEPARTMENT_OTHER): Payer: Medicaid Other

## 2013-11-14 DIAGNOSIS — Z452 Encounter for adjustment and management of vascular access device: Secondary | ICD-10-CM

## 2013-11-14 DIAGNOSIS — C182 Malignant neoplasm of ascending colon: Secondary | ICD-10-CM

## 2013-11-14 MED ORDER — PROCHLORPERAZINE MALEATE 10 MG PO TABS
10.0000 mg | ORAL_TABLET | Freq: Once | ORAL | Status: AC
  Administered 2013-11-14: 10 mg via ORAL

## 2013-11-14 MED ORDER — SODIUM CHLORIDE 0.9 % IJ SOLN
10.0000 mL | INTRAMUSCULAR | Status: DC | PRN
  Administered 2013-11-14: 10 mL
  Filled 2013-11-14: qty 10

## 2013-11-14 MED ORDER — PROCHLORPERAZINE MALEATE 10 MG PO TABS
ORAL_TABLET | ORAL | Status: AC
  Filled 2013-11-14: qty 1

## 2013-11-14 MED ORDER — HEPARIN SOD (PORK) LOCK FLUSH 100 UNIT/ML IV SOLN
500.0000 [IU] | Freq: Once | INTRAVENOUS | Status: AC | PRN
  Administered 2013-11-14: 500 [IU]
  Filled 2013-11-14: qty 5

## 2013-11-14 MED ORDER — ONDANSETRON HCL 8 MG PO TABS: 8.0000 mg | ORAL_TABLET | Freq: Once | ORAL | Status: DC

## 2013-11-14 NOTE — Patient Instructions (Signed)
Chemotherapy  Many people are apprehensive about chemotherapy due to concerns over uncomfortable side effects. However, managements for side effects have come a long way. Many side effects once associated with chemotherapy can be prevented and/or controlled.  WHAT IS CHEMOTHERAPY?  Chemotherapy is the general term for any treatment involving the use of chemical agents. Chemotherapy can be given through a vein, most commonly through an implanted port* or PICC line.* It can also be delivered by mouth (orally) in the form of a pill. The main goal of chemotherapy is to kill cancer cells and stop them from growing. It can destroy and eliminate cancer cells where the cancer started (primary tumor location) and throughout the body, often far away from the original cancer. It is a treatment that not only targets the original cancer location, but also the entire body (systemic treatment) for full effect and results.  Chemotherapy works by destroying cancer cells. Unfortunately, it cannot tell the difference between a cancer cell and some healthy cells. This results in the death of noncancerous cells, such as hair and blood cells. Harm to healthy cells is what causes side effects. These cells usually repair themselves after chemotherapy.  Because some drugs work better together rather than alone, 2 or more drugs are often given at the same time. This is called combination chemotherapy.  Depending on the type of cancer and how advanced it is, chemotherapy can be used for different goals:   Cure the cancer.   Keep the cancer from spreading.   Slow the cancer's growth.   Kill cancer cells that may have spread to other parts of the body from the original tumor.   Relieve symptoms caused by cancer.  You and your caregiver will decide what drug or combination of drugs you will get. Your caregiver will choose the doses, how the drugs will be given, how often, and how long you will get treatment. All of these decisions will  depend on the type of cancer, where it is, how big it is, and how it is affecting your normal body functions and overall health.  *Implanted port - A device that is implanted under your skin so that medicines may be delivered directly into your blood system.  *PICC line (peripherally inserted central catheter) - A long, slender, flexible tube. This tube is often inserted into a vein, typically in the upper arm. The tip stops in the large central vein that leads to your heart.  Document Released: 06/04/2007 Document Revised: 10/30/2011 Document Reviewed: 11/19/2008  [employer] Patient Information 2014 [employer].

## 2013-11-15 IMAGING — CR DG CHEST 2V
2 series · 2 of 2 positions shown · non-contrast
Comparison: None.

CLINICAL DATA: Shortness of breath.

CHEST - 2 VIEW

[w chest pa]
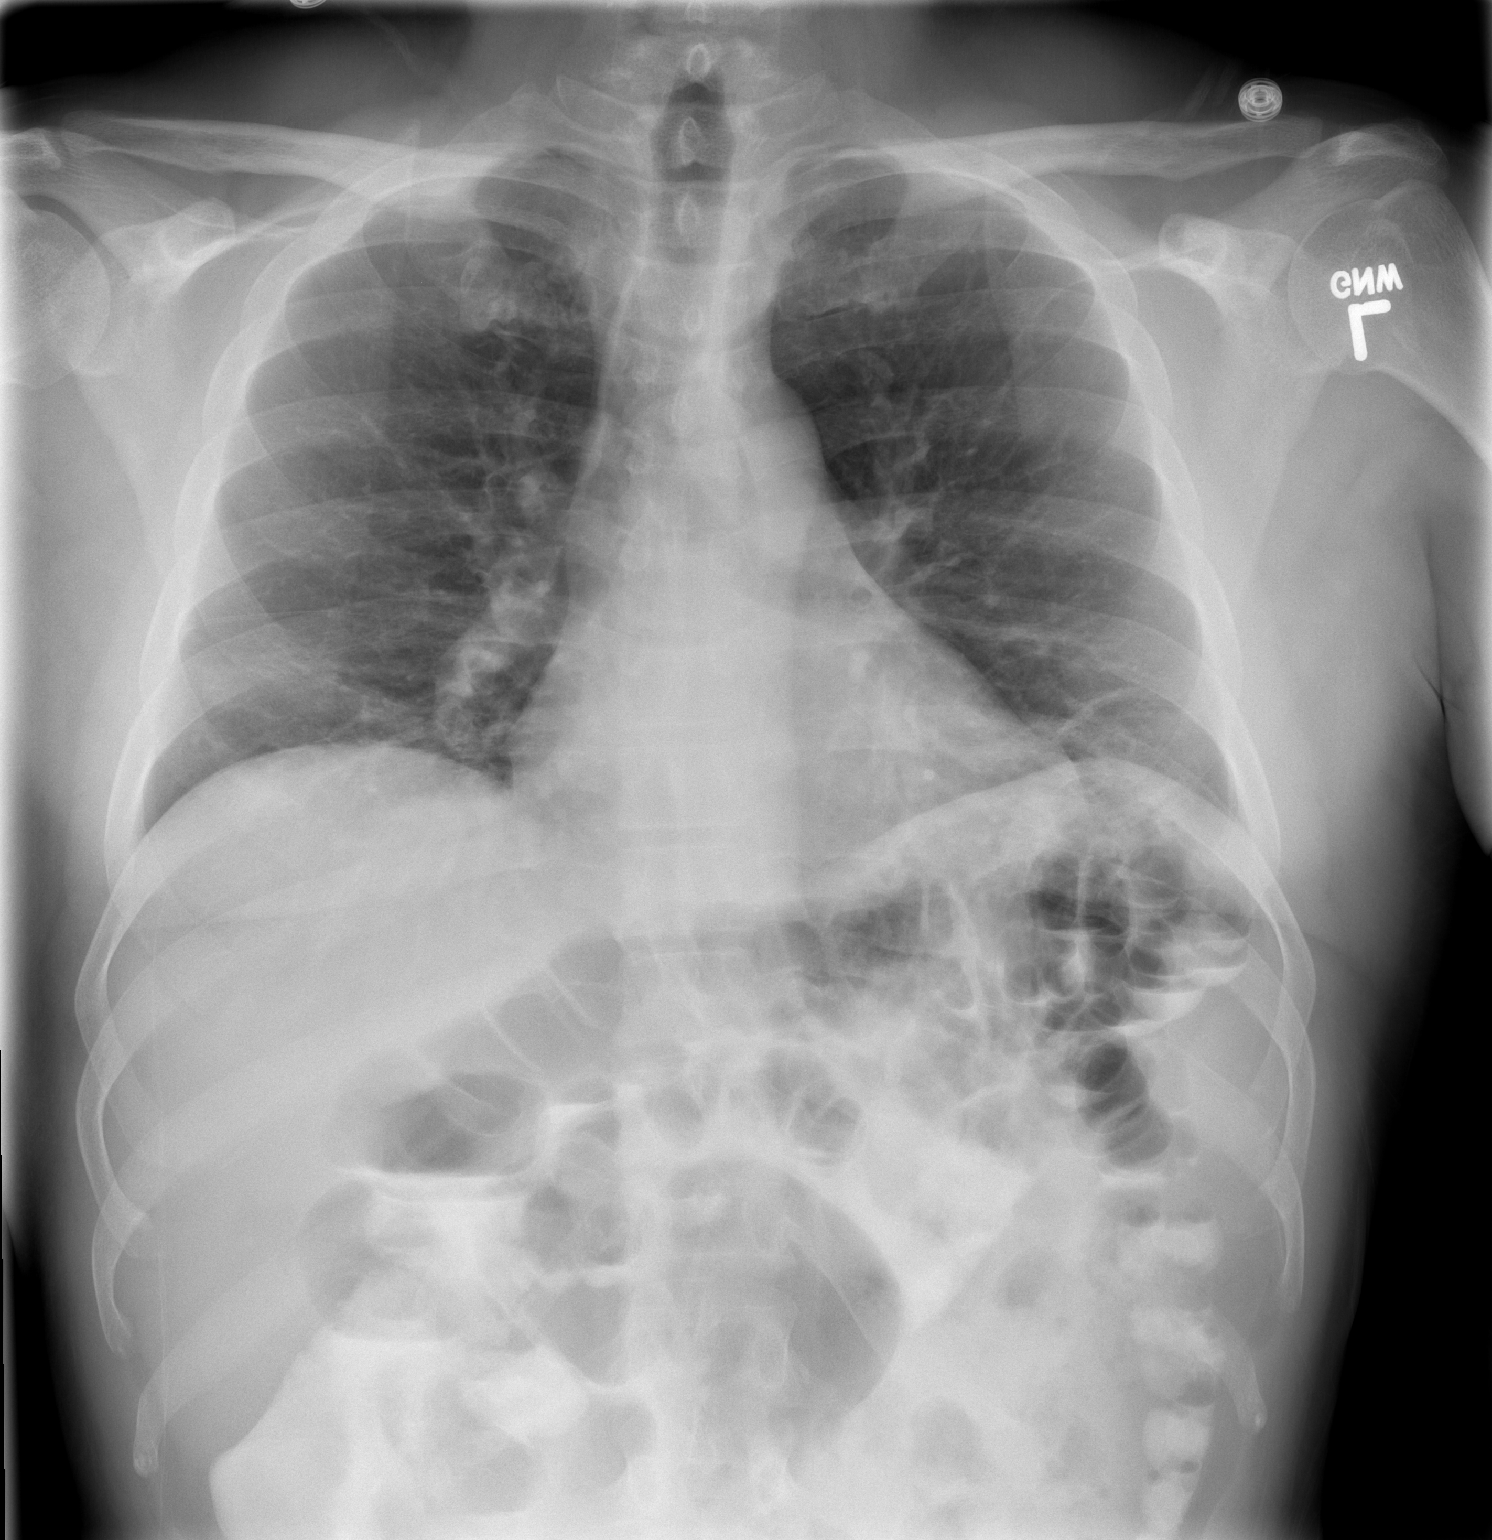

[w chest lat]
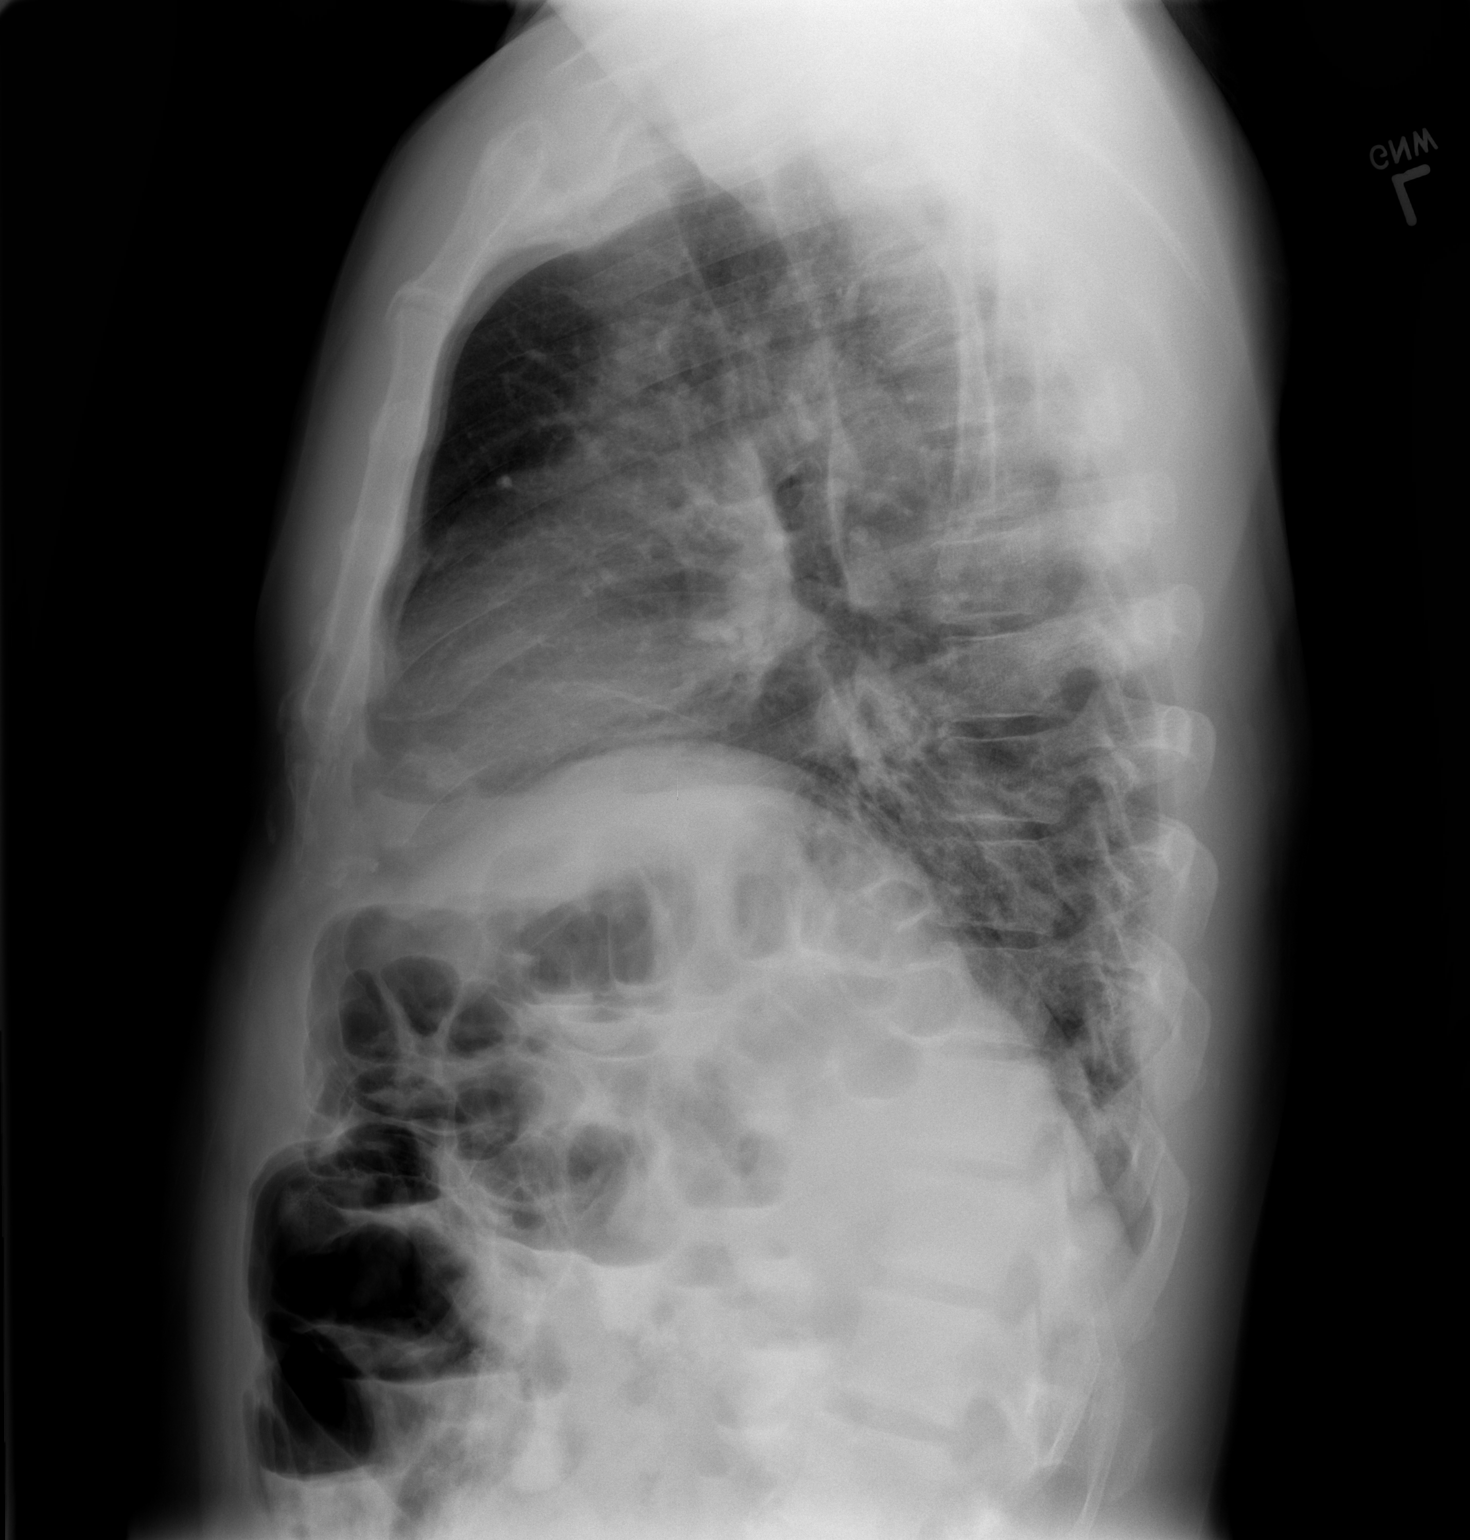

[2 of 2 positions shown; findings below may reference images not displayed]

FINDINGS: Shallow inspiration.  Normal heart size and pulmonary
vascularity.  Infiltration or atelectasis in the left lung base.
No blunting of costophrenic angles.  No pneumothorax.  Mediastinal
contours appear intact.
IMPRESSION: Shallow inspiration with infiltration or atelectasis in the left
lung base.

## 2013-12-03 ENCOUNTER — Ambulatory Visit (HOSPITAL_BASED_OUTPATIENT_CLINIC_OR_DEPARTMENT_OTHER): Payer: Medicaid Other | Admitting: Hematology & Oncology

## 2013-12-03 ENCOUNTER — Encounter: Payer: Self-pay | Admitting: Pharmacist

## 2013-12-03 ENCOUNTER — Encounter: Payer: Self-pay | Admitting: Hematology & Oncology

## 2013-12-03 ENCOUNTER — Other Ambulatory Visit (HOSPITAL_BASED_OUTPATIENT_CLINIC_OR_DEPARTMENT_OTHER): Payer: Medicaid Other | Admitting: Lab

## 2013-12-03 VITALS — BP 113/76 | HR 81 | Temp 98.1°F | Resp 18 | Ht 67.0 in | Wt 224.0 lb

## 2013-12-03 DIAGNOSIS — C779 Secondary and unspecified malignant neoplasm of lymph node, unspecified: Secondary | ICD-10-CM

## 2013-12-03 DIAGNOSIS — C182 Malignant neoplasm of ascending colon: Secondary | ICD-10-CM

## 2013-12-03 LAB — CMP (CANCER CENTER ONLY)
ALBUMIN: 3.3 g/dL (ref 3.3–5.5)
ALK PHOS: 95 U/L — AB (ref 26–84)
ALT(SGPT): 20 U/L (ref 10–47)
AST: 18 U/L (ref 11–38)
BUN, Bld: 12 mg/dL (ref 7–22)
CO2: 29 meq/L (ref 18–33)
Calcium: 9.4 mg/dL (ref 8.0–10.3)
Chloride: 105 mEq/L (ref 98–108)
Creat: 1 mg/dl (ref 0.6–1.2)
GLUCOSE: 99 mg/dL (ref 73–118)
POTASSIUM: 3.9 meq/L (ref 3.3–4.7)
SODIUM: 139 meq/L (ref 128–145)
TOTAL PROTEIN: 7.1 g/dL (ref 6.4–8.1)
Total Bilirubin: 0.5 mg/dl (ref 0.20–1.60)

## 2013-12-03 LAB — CBC WITH DIFFERENTIAL (CANCER CENTER ONLY)
BASO#: 0 10*3/uL (ref 0.0–0.2)
BASO%: 0.4 % (ref 0.0–2.0)
EOS%: 2.6 % (ref 0.0–7.0)
Eosinophils Absolute: 0.1 10*3/uL (ref 0.0–0.5)
HCT: 41.6 % (ref 38.7–49.9)
HEMOGLOBIN: 13.6 g/dL (ref 13.0–17.1)
LYMPH#: 1.7 10*3/uL (ref 0.9–3.3)
LYMPH%: 36.6 % (ref 14.0–48.0)
MCH: 25.3 pg — AB (ref 28.0–33.4)
MCHC: 32.7 g/dL (ref 32.0–35.9)
MCV: 78 fL — ABNORMAL LOW (ref 82–98)
MONO#: 0.9 10*3/uL (ref 0.1–0.9)
MONO%: 19.3 % — ABNORMAL HIGH (ref 0.0–13.0)
NEUT%: 41.1 % (ref 40.0–80.0)
NEUTROS ABS: 1.9 10*3/uL (ref 1.5–6.5)
Platelets: 248 10*3/uL (ref 145–400)
RBC: 5.37 10*6/uL (ref 4.20–5.70)
RDW: 15.5 % (ref 11.1–15.7)
WBC: 4.6 10*3/uL (ref 4.0–10.0)

## 2013-12-03 LAB — LACTATE DEHYDROGENASE: LDH: 118 U/L (ref 94–250)

## 2013-12-03 LAB — CEA: CEA: 23 ng/mL — ABNORMAL HIGH (ref 0.0–5.0)

## 2013-12-03 NOTE — Progress Notes (Signed)
Hematology and Oncology Follow Up Visit  KAINE MCQUILLEN [id number] [birth year] 43 y.o. 12/03/2013   Principle Diagnosis:   Metastatic colon cancer-KRAS wild-type  Current Therapy:   Status post 1 cycle of FOLFIRI/Avastin      Interim History:  Mr.  Bruins is back for followup. He actually tolerated his first cycle of chemotherapy pretty well. 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  He's had no abnormal pain. His appetite is quite good. He's had no bleeding. He's had no diarrhea. She's had no leg swelling. There's been no rashes. He's had no headache.  He is under a lot of stress at home. Family issues still have been a real problem.  He's had no cough. He's had no fever.  Overall, his performance status is ECOG 1 Medications: Current outpatient prescriptions:clorazepate (TRANXENE-T) 3.75 MG tablet, Take 1-2 pills, IF NEEDED, every 8 hrs for anxiety, Disp: 60 tablet, Rfl: 3;  dexamethasone (DECADRON) 4 MG tablet, Take 2 tablets (8 mg total) by mouth 2 (two) times daily with a meal. Start the day after chemotherapy for 3 days. Take with food., Disp: 30 tablet, Rfl: 1 ibuprofen (ADVIL,MOTRIN) 200 MG tablet, Take 200 mg by mouth every 6 (six) hours as needed., Disp: , Rfl: ;  lidocaine-prilocaine (EMLA) cream, Apply topically as needed., Disp: 30 g, Rfl: 3;  loperamide (IMODIUM A-D) 2 MG tablet, Take 2 at onset of diarrhea, then 1 every 2hrs until 12hr without a BM. May take 2 tab every 4hrs at bedtime. If diarrhea recurs repeat., Disp: 100 tablet, Rfl: 1 ondansetron (ZOFRAN) 8 MG tablet, Take 1 tablet (8 mg total) by mouth 2 (two) times daily. Start the day after chemo for 3 days. Then take as needed for nausea or vomiting., Disp: 30 tablet, Rfl: 1;  prochlorperazine (COMPAZINE) 10 MG tablet, Take 1 tablet (10 mg total) by mouth every 6 (six) hours as needed (Nausea or vomiting)., Disp: 30 tablet, Rfl: 1 Pyridoxine HCl (VITAMIN B-6) 250 MG tablet, Take 1  tablet (250 mg total) by mouth daily., Disp: 30 tablet, Rfl: 6;  zolpidem (AMBIEN CR) 12.5 MG CR tablet, Take 12.5 mg by mouth at bedtime as needed for sleep. PT NEVER PICKED UP AT PHARMACY, Disp: , Rfl:   Allergies: No Known Allergies  Past Medical History, Surgical history, Social history, and Family History were reviewed and updated.  Review of Systems: As above  Physical Exam:  height is _0  (1.702 m) and weight is 224 lb (101.606 kg). His oral temperature is 98.1 F (36.7 C). His blood pressure is 113/76 and his pulse is 81. His respiration is 18.   Well-developed well-nourished African American gentleman. Head and neck exam shows no sclera icterus. There is no mucositis in the oral cavity. He has no adenopathy in the neck. Lungs are clear. Cardiac exam regular rate rhythm with no murmurs or rubs or bruits. Abdomen soft. Laparotomy scar. He has no fluid wave. There is no guarding or rebound tenderness. There is no palpable liver or spleen tip. Extremities shows no clubbing cyanosis or edema. Skin exam no rashes. There is no ecchymoses. Neurological exam shows no focal neurological deficits.  Lab Results  Component Value Date   WBC 4.6 12/03/2013   HGB 13.6 12/03/2013   HCT 41.6 12/03/2013   MCV 78* 12/03/2013   PLT 248 12/03/2013     Chemistry      Component Value Date/Time   NA 139 12/03/2013 1318   NA  141 10/01/2013 0935   K 3.9 12/03/2013 1318   K 4.2 10/01/2013 0935   CL 105 12/03/2013 1318   CL 106 10/01/2013 0935   CO2 29 12/03/2013 1318   CO2 25 10/01/2013 0935   BUN 12 12/03/2013 1318   BUN 13 10/01/2013 0935   CREATININE 1.0 12/03/2013 1318   CREATININE 0.95 10/01/2013 0935      Component Value Date/Time   CALCIUM 9.4 12/03/2013 1318   CALCIUM 9.4 10/01/2013 0935   ALKPHOS 95* 12/03/2013 1318   ALKPHOS 80 10/01/2013 0935   AST 18 12/03/2013 1318   AST 14 10/01/2013 0935   ALT 20 12/03/2013 1318   ALT 15 10/01/2013 0935   BILITOT 0.50 12/03/2013 1318   BILITOT 0.3 10/01/2013 0935          Impression and Plan: Mr. Borghi is 43 year old gentleman. His metastatic colon cancer. He initially presented with stage IIIc disease. He was treated with chemotherapy postsurgery. He then had recurrence. The recurrence has been outside his liver. He did not want aggressive surgical intervention with intraperitoneal chemotherapy. As such, we are trying systemic chemotherapy for right now.  He's had a really hard at home. Because of his home situation, has not been able to handle chemotherapy until now. He feels like he can go onto treatment.  We will get him on to treatment now. We will start him back up next week. We will try to force "the issue" and get him treated every 2 weeks.  After 4 cycles we will followup with her scans.  His CEA is elevated so we can follow this.  I spent a good 40 minutes with him today. I just like to be able to talk with him and have him feel that we are he help him out with all the issues that he has at home. re to be able to  Volanda Napoleon, MD 4/15/20156:07 PM

## 2013-12-10 ENCOUNTER — Ambulatory Visit: Payer: Medicaid Other

## 2013-12-10 ENCOUNTER — Ambulatory Visit (HOSPITAL_BASED_OUTPATIENT_CLINIC_OR_DEPARTMENT_OTHER): Payer: Medicaid Other

## 2013-12-10 ENCOUNTER — Ambulatory Visit (HOSPITAL_BASED_OUTPATIENT_CLINIC_OR_DEPARTMENT_OTHER): Payer: Medicaid Other | Admitting: Hematology & Oncology

## 2013-12-10 ENCOUNTER — Other Ambulatory Visit (HOSPITAL_BASED_OUTPATIENT_CLINIC_OR_DEPARTMENT_OTHER): Payer: Medicaid Other | Admitting: Lab

## 2013-12-10 ENCOUNTER — Ambulatory Visit: Payer: Medicaid Other | Admitting: Hematology & Oncology

## 2013-12-10 ENCOUNTER — Other Ambulatory Visit: Payer: Medicaid Other | Admitting: Lab

## 2013-12-10 VITALS — BP 129/88 | HR 74 | Temp 98.1°F | Resp 18

## 2013-12-10 DIAGNOSIS — Z5111 Encounter for antineoplastic chemotherapy: Secondary | ICD-10-CM

## 2013-12-10 DIAGNOSIS — C182 Malignant neoplasm of ascending colon: Secondary | ICD-10-CM

## 2013-12-10 DIAGNOSIS — C50919 Malignant neoplasm of unspecified site of unspecified female breast: Secondary | ICD-10-CM

## 2013-12-10 DIAGNOSIS — Z5112 Encounter for antineoplastic immunotherapy: Secondary | ICD-10-CM

## 2013-12-10 DIAGNOSIS — C779 Secondary and unspecified malignant neoplasm of lymph node, unspecified: Secondary | ICD-10-CM

## 2013-12-10 LAB — CBC WITH DIFFERENTIAL (CANCER CENTER ONLY)
BASO#: 0 10*3/uL (ref 0.0–0.2)
BASO%: 0.2 % (ref 0.0–2.0)
EOS%: 3.5 % (ref 0.0–7.0)
Eosinophils Absolute: 0.2 10*3/uL (ref 0.0–0.5)
HCT: 42 % (ref 38.7–49.9)
HGB: 13.7 g/dL (ref 13.0–17.1)
LYMPH#: 2.2 10*3/uL (ref 0.9–3.3)
LYMPH%: 37.7 % (ref 14.0–48.0)
MCH: 25.3 pg — ABNORMAL LOW (ref 28.0–33.4)
MCHC: 32.6 g/dL (ref 32.0–35.9)
MCV: 78 fL — ABNORMAL LOW (ref 82–98)
MONO#: 0.9 10*3/uL (ref 0.1–0.9)
MONO%: 16 % — AB (ref 0.0–13.0)
NEUT%: 42.6 % (ref 40.0–80.0)
NEUTROS ABS: 2.5 10*3/uL (ref 1.5–6.5)
PLATELETS: 270 10*3/uL (ref 145–400)
RBC: 5.41 10*6/uL (ref 4.20–5.70)
RDW: 15.7 % (ref 11.1–15.7)
WBC: 5.8 10*3/uL (ref 4.0–10.0)

## 2013-12-10 LAB — CMP (CANCER CENTER ONLY)
ALBUMIN: 3.6 g/dL (ref 3.3–5.5)
ALK PHOS: 98 U/L — AB (ref 26–84)
ALT(SGPT): 14 U/L (ref 10–47)
AST: 19 U/L (ref 11–38)
BILIRUBIN TOTAL: 0.5 mg/dL (ref 0.20–1.60)
BUN: 10 mg/dL (ref 7–22)
CO2: 30 mEq/L (ref 18–33)
Calcium: 9.2 mg/dL (ref 8.0–10.3)
Chloride: 102 mEq/L (ref 98–108)
Creat: 0.8 mg/dl (ref 0.6–1.2)
Glucose, Bld: 100 mg/dL (ref 73–118)
Potassium: 3.8 mEq/L (ref 3.3–4.7)
SODIUM: 146 meq/L — AB (ref 128–145)
Total Protein: 7.4 g/dL (ref 6.4–8.1)

## 2013-12-10 MED ORDER — IRINOTECAN HCL CHEMO INJECTION 100 MG/5ML
162.0000 mg/m2 | Freq: Once | INTRAVENOUS | Status: AC
  Administered 2013-12-10: 354 mg via INTRAVENOUS
  Filled 2013-12-10: qty 5.9

## 2013-12-10 MED ORDER — SODIUM CHLORIDE 0.9 % IV SOLN
5.0000 mg/kg | Freq: Once | INTRAVENOUS | Status: AC
  Administered 2013-12-10: 500 mg via INTRAVENOUS
  Filled 2013-12-10: qty 20

## 2013-12-10 MED ORDER — FLUOROURACIL CHEMO INJECTION 2.5 GM/50ML
400.0000 mg/m2 | Freq: Once | INTRAVENOUS | Status: AC
  Administered 2013-12-10: 900 mg via INTRAVENOUS
  Filled 2013-12-10: qty 18

## 2013-12-10 MED ORDER — ATROPINE SULFATE 1 MG/ML IJ SOLN
INTRAMUSCULAR | Status: AC
  Filled 2013-12-10: qty 1

## 2013-12-10 MED ORDER — DEXAMETHASONE SODIUM PHOSPHATE 20 MG/5ML IJ SOLN
20.0000 mg | Freq: Once | INTRAMUSCULAR | Status: AC
  Administered 2013-12-10: 20 mg via INTRAVENOUS

## 2013-12-10 MED ORDER — LEUCOVORIN CALCIUM INJECTION 350 MG
400.0000 mg/m2 | Freq: Once | INTRAVENOUS | Status: AC
  Administered 2013-12-10: 876 mg via INTRAVENOUS
  Filled 2013-12-10: qty 43.8

## 2013-12-10 MED ORDER — SODIUM CHLORIDE 0.9 % IJ SOLN
10.0000 mL | INTRAMUSCULAR | Status: DC | PRN
  Filled 2013-12-10: qty 10

## 2013-12-10 MED ORDER — ONDANSETRON 16 MG/50ML IVPB (CHCC)
INTRAVENOUS | Status: AC
  Filled 2013-12-10: qty 16

## 2013-12-10 MED ORDER — SODIUM CHLORIDE 0.9 % IV SOLN
Freq: Once | INTRAVENOUS | Status: AC
  Administered 2013-12-10: 10:00:00 via INTRAVENOUS

## 2013-12-10 MED ORDER — LORAZEPAM 2 MG/ML IJ SOLN
1.0000 mg | Freq: Once | INTRAMUSCULAR | Status: AC
  Administered 2013-12-10: 1 mg via INTRAVENOUS

## 2013-12-10 MED ORDER — HEPARIN SOD (PORK) LOCK FLUSH 100 UNIT/ML IV SOLN
500.0000 [IU] | Freq: Once | INTRAVENOUS | Status: DC | PRN
  Filled 2013-12-10: qty 5

## 2013-12-10 MED ORDER — ATROPINE SULFATE 1 MG/ML IJ SOLN
0.5000 mg | Freq: Once | INTRAMUSCULAR | Status: AC | PRN
  Administered 2013-12-10: 0.5 mg via INTRAVENOUS

## 2013-12-10 MED ORDER — SODIUM CHLORIDE 0.9 % IV SOLN
2400.0000 mg/m2 | INTRAVENOUS | Status: DC
  Administered 2013-12-10: 5250 mg via INTRAVENOUS
  Filled 2013-12-10: qty 105

## 2013-12-10 MED ORDER — DEXAMETHASONE SODIUM PHOSPHATE 20 MG/5ML IJ SOLN
INTRAMUSCULAR | Status: AC
  Filled 2013-12-10: qty 5

## 2013-12-10 MED ORDER — LORAZEPAM 2 MG/ML IJ SOLN
INTRAMUSCULAR | Status: AC
  Filled 2013-12-10: qty 1

## 2013-12-10 MED ORDER — ONDANSETRON 16 MG/50ML IVPB (CHCC)
16.0000 mg | Freq: Once | INTRAVENOUS | Status: AC
  Administered 2013-12-10: 16 mg via INTRAVENOUS

## 2013-12-10 NOTE — Progress Notes (Signed)
Hematology and Oncology Follow Up Visit  DINERO CHAVIRA [id number] [birth year] 43 y.o. 12/10/2013   Principle Diagnosis:   Metastatic colon cancer-KRAS wild-type  Current Therapy:   Status post 1 cycle of FOLFIRI/Avastin      Interim History:  Mr.  Gerrard is back for followup. He actually tolerated his first cycle of chemotherapy pretty well. 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 His CEA went down from 54 to 28.  He's had no abnormal pain. His appetite is quite good. He's had no bleeding. He's had no diarrhea. She's had no leg swelling. There's been no rashes. He's had no headache.  He is under a lot of stress at home. Family issues still have been a real problem.  He's had no cough. He's had no fever.  Overall, his performance status is ECOG 1 Medications: Current outpatient prescriptions:clorazepate (TRANXENE-T) 3.75 MG tablet, Take 1-2 pills, IF NEEDED, every 8 hrs for anxiety, Disp: 60 tablet, Rfl: 3;  dexamethasone (DECADRON) 4 MG tablet, Take 2 tablets (8 mg total) by mouth 2 (two) times daily with a meal. Start the day after chemotherapy for 3 days. Take with food., Disp: 30 tablet, Rfl: 1 ibuprofen (ADVIL,MOTRIN) 200 MG tablet, Take 200 mg by mouth every 6 (six) hours as needed., Disp: , Rfl: ;  lidocaine-prilocaine (EMLA) cream, Apply topically as needed., Disp: 30 g, Rfl: 3;  loperamide (IMODIUM A-D) 2 MG tablet, Take 2 at onset of diarrhea, then 1 every 2hrs until 12hr without a BM. May take 2 tab every 4hrs at bedtime. If diarrhea recurs repeat., Disp: 100 tablet, Rfl: 1 ondansetron (ZOFRAN) 8 MG tablet, Take 1 tablet (8 mg total) by mouth 2 (two) times daily. Start the day after chemo for 3 days. Then take as needed for nausea or vomiting., Disp: 30 tablet, Rfl: 1;  prochlorperazine (COMPAZINE) 10 MG tablet, Take 1 tablet (10 mg total) by mouth every 6 (six) hours as needed (Nausea or vomiting)., Disp: 30 tablet, Rfl: 1 Pyridoxine HCl  (VITAMIN B-6) 250 MG tablet, Take 1 tablet (250 mg total) by mouth daily., Disp: 30 tablet, Rfl: 6;  zolpidem (AMBIEN CR) 12.5 MG CR tablet, Take 12.5 mg by mouth at bedtime as needed for sleep. PT NEVER PICKED UP AT PHARMACY, Disp: , Rfl:   Allergies: No Known Allergies  Past Medical History, Surgical history, Social history, and Family History were reviewed and updated.  Review of Systems: As above  Physical Exam:  vitals were not taken for this visit.  Well-developed well-nourished African American gentleman. Head and neck exam shows no sclera icterus. There is no mucositis in the oral cavity. He has no adenopathy in the neck. Lungs are clear. Cardiac exam regular rate rhythm with no murmurs or rubs or bruits. Abdomen soft. Laparotomy scar. He has no fluid wave. There is no guarding or rebound tenderness. There is no palpable liver or spleen tip. Extremities shows no clubbing cyanosis or edema. Skin exam no rashes. There is no ecchymoses. Neurological exam shows no focal neurological deficits.  Lab Results  Component Value Date   WBC 5.8 12/10/2013   HGB 13.7 12/10/2013   HCT 42.0 12/10/2013   MCV 78* 12/10/2013   PLT 270 12/10/2013     Chemistry      Component Value Date/Time   NA 146* 12/10/2013 0919   NA 141 10/01/2013 0935   K 3.8 12/10/2013 0919   K 4.2 10/01/2013 0935   CL 102 12/10/2013 0919  CL 106 10/01/2013 0935   CO2 30 12/10/2013 0919   CO2 25 10/01/2013 0935   BUN 10 12/10/2013 0919   BUN 13 10/01/2013 0935   CREATININE 0.8 12/10/2013 0919   CREATININE 0.95 10/01/2013 0935      Component Value Date/Time   CALCIUM 9.2 12/10/2013 0919   CALCIUM 9.4 10/01/2013 0935   ALKPHOS 98* 12/10/2013 0919   ALKPHOS 80 10/01/2013 0935   AST 19 12/10/2013 0919   AST 14 10/01/2013 0935   ALT 14 12/10/2013 0919   ALT 15 10/01/2013 0935   BILITOT 0.50 12/10/2013 0919   BILITOT 0.3 10/01/2013 0935         Impression and Plan: Mr. Yasuda is 43 year old gentleman. His metastatic colon  cancer. He initially presented with stage IIIc disease. He was treated with chemotherapy postsurgery. He then had recurrence. The recurrence has been outside his liver. He did not want aggressive surgical intervention with intraperitoneal chemotherapy. As such, we are trying systemic chemotherapy for right now.  He's had a really hard at home. Because of his home situation, has not been able to handle chemotherapy until now. He feels like he can go onto treatment.  We will get him on to his second treatment now. We will get him back in 2 weeks for his third cycle.. We will try to force "the issue" and get him treated every 2 weeks.  After 4 cycles we will followup with her scans.  His CEA is elevated so we can follow this.    Volanda Napoleon, MD 4/22/20155:54 PM

## 2013-12-10 NOTE — Patient Instructions (Addendum)
[facility] Discharge Instructions for Patients Receiving Chemotherapy  Today you received the following chemotherapy agents Irinotecan, leucovorin and 5FU  To help prevent nausea and vomiting after your treatment, we encourage you to take your nausea medication as prescribed   If you develop nausea and vomiting that is not controlled by your nausea medication, call the clinic. If it is after clinic hours your family physician or the after hours number for the clinic or go to the Emergency Department.   BELOW ARE SYMPTOMS THAT SHOULD BE REPORTED IMMEDIATELY:  *FEVER GREATER THAN 100.5 F  *CHILLS WITH OR WITHOUT FEVER  NAUSEA AND VOMITING THAT IS NOT CONTROLLED WITH YOUR NAUSEA MEDICATION  *UNUSUAL SHORTNESS OF BREATH  *UNUSUAL BRUISING OR BLEEDING  TENDERNESS IN MOUTH AND THROAT WITH OR WITHOUT PRESENCE OF ULCERS  *URINARY PROBLEMS  *BOWEL PROBLEMS  UNUSUAL RASH Items with * indicate a potential emergency and should be followed up as soon as possible.  One of the nurses will contact you 24 hours after your treatment. Please let the nurse know about any problems that you may have experienced. Feel free to call the clinic you have any questions or concerns. The clinic phone number is [phone].   I have been informed and understand all the instructions given to me. I know to contact the clinic, my physician, or go to the Emergency Department if any problems should occur. I do not have any questions at this time, but understand that I may call the clinic during office hours   should I have any questions or need assistance in obtaining follow up care.    __________________________________________  _____________  __________ Signature of Patient or Authorized Representative            Date                   Time    __________________________________________ Nurse's Signature

## 2013-12-10 NOTE — Progress Notes (Signed)
Patient c/o eyes twitching and chest. Dr. Marin Olp was made aware of it and order to give patient 1 mg of ativan. Patient was fine after getting ativan.

## 2013-12-11 LAB — CEA: CEA: 23.1 ng/mL — ABNORMAL HIGH (ref 0.0–5.0)

## 2013-12-12 ENCOUNTER — Ambulatory Visit (HOSPITAL_BASED_OUTPATIENT_CLINIC_OR_DEPARTMENT_OTHER): Payer: Medicaid Other

## 2013-12-12 VITALS — BP 134/83 | HR 82 | Temp 97.5°F | Resp 20

## 2013-12-12 DIAGNOSIS — C182 Malignant neoplasm of ascending colon: Secondary | ICD-10-CM

## 2013-12-12 MED ORDER — HEPARIN SOD (PORK) LOCK FLUSH 100 UNIT/ML IV SOLN
500.0000 [IU] | Freq: Once | INTRAVENOUS | Status: AC | PRN
  Administered 2013-12-12: 500 [IU]
  Filled 2013-12-12: qty 5

## 2013-12-12 MED ORDER — SODIUM CHLORIDE 0.9 % IJ SOLN
10.0000 mL | INTRAMUSCULAR | Status: DC | PRN
  Administered 2013-12-12: 10 mL
  Filled 2013-12-12: qty 10

## 2013-12-12 NOTE — Patient Instructions (Signed)

## 2013-12-22 ENCOUNTER — Telehealth: Payer: Self-pay | Admitting: Hematology & Oncology

## 2013-12-22 NOTE — Telephone Encounter (Signed)
Pt aware 5-6 moved to 5-13. Date per RN

## 2013-12-24 ENCOUNTER — Other Ambulatory Visit: Payer: Medicaid Other | Admitting: Lab

## 2013-12-24 ENCOUNTER — Ambulatory Visit: Payer: Medicaid Other

## 2013-12-24 ENCOUNTER — Ambulatory Visit: Payer: Medicaid Other | Admitting: Hematology & Oncology

## 2013-12-31 ENCOUNTER — Ambulatory Visit: Payer: Medicaid Other

## 2013-12-31 ENCOUNTER — Ambulatory Visit (HOSPITAL_BASED_OUTPATIENT_CLINIC_OR_DEPARTMENT_OTHER): Payer: Medicaid Other | Admitting: Hematology & Oncology

## 2013-12-31 ENCOUNTER — Other Ambulatory Visit (HOSPITAL_BASED_OUTPATIENT_CLINIC_OR_DEPARTMENT_OTHER): Payer: Medicaid Other | Admitting: Lab

## 2013-12-31 DIAGNOSIS — C182 Malignant neoplasm of ascending colon: Secondary | ICD-10-CM

## 2013-12-31 DIAGNOSIS — C189 Malignant neoplasm of colon, unspecified: Secondary | ICD-10-CM

## 2013-12-31 LAB — CMP (CANCER CENTER ONLY)
ALK PHOS: 84 U/L (ref 26–84)
ALT: 25 U/L (ref 10–47)
AST: 18 U/L (ref 11–38)
Albumin: 3.5 g/dL (ref 3.3–5.5)
BUN: 14 mg/dL (ref 7–22)
CALCIUM: 9.3 mg/dL (ref 8.0–10.3)
CHLORIDE: 102 meq/L (ref 98–108)
CO2: 27 mEq/L (ref 18–33)
Creat: 1.1 mg/dl (ref 0.6–1.2)
Glucose, Bld: 161 mg/dL — ABNORMAL HIGH (ref 73–118)
POTASSIUM: 3.7 meq/L (ref 3.3–4.7)
SODIUM: 140 meq/L (ref 128–145)
TOTAL PROTEIN: 7.6 g/dL (ref 6.4–8.1)
Total Bilirubin: 0.6 mg/dl (ref 0.20–1.60)

## 2013-12-31 LAB — CBC WITH DIFFERENTIAL (CANCER CENTER ONLY)
BASO#: 0 10*3/uL (ref 0.0–0.2)
BASO%: 0.4 % (ref 0.0–2.0)
EOS ABS: 0.2 10*3/uL (ref 0.0–0.5)
EOS%: 4.2 % (ref 0.0–7.0)
HEMATOCRIT: 46 % (ref 38.7–49.9)
HGB: 14.9 g/dL (ref 13.0–17.1)
LYMPH#: 2.6 10*3/uL (ref 0.9–3.3)
LYMPH%: 45.2 % (ref 14.0–48.0)
MCH: 25 pg — ABNORMAL LOW (ref 28.0–33.4)
MCHC: 32.4 g/dL (ref 32.0–35.9)
MCV: 77 fL — ABNORMAL LOW (ref 82–98)
MONO#: 0.8 10*3/uL (ref 0.1–0.9)
MONO%: 14.8 % — ABNORMAL HIGH (ref 0.0–13.0)
NEUT#: 2 10*3/uL (ref 1.5–6.5)
NEUT%: 35.4 % — ABNORMAL LOW (ref 40.0–80.0)
PLATELETS: 303 10*3/uL (ref 145–400)
RBC: 5.96 10*6/uL — ABNORMAL HIGH (ref 4.20–5.70)
RDW: 18.8 % — ABNORMAL HIGH (ref 11.1–15.7)
WBC: 5.7 10*3/uL (ref 4.0–10.0)

## 2013-12-31 LAB — CEA: CEA: 20 ng/mL — AB (ref 0.0–5.0)

## 2013-12-31 LAB — LACTATE DEHYDROGENASE: LDH: 156 U/L (ref 94–250)

## 2013-12-31 NOTE — Progress Notes (Signed)
No treatment today per M.D order

## 2014-01-01 ENCOUNTER — Telehealth: Payer: Self-pay | Admitting: Hematology & Oncology

## 2014-01-01 NOTE — Telephone Encounter (Signed)
Left pt message to call all his appointments have changed. Per Dr. Marin Olp moved 5-27 to 6-3 and everything else cx

## 2014-01-01 NOTE — Progress Notes (Signed)
Hematology and Oncology Follow Up Visit  David Conrad [id number] 09-09-1970 43 y.o. 01/01/2014   Principle Diagnosis:   Metastatic colon cancer- KRAS wild type  Current Therapy:    Status post 2 cycles of FOLFIR/Avastin      Interim History:  Mr.  Conrad is in for a he's third cycle of chemotherapy. Unfortunately, he is just an emotional "wreck".   his grandmother who raised him, is in the hospital and dying. She is 43 years old. He really does not want to tell me about what is followed her. She is just very emotional. We talked for about 45 minutes. It has just been very difficult on him. He himself [place] of issues to deal with. Most of the issues are with his family. This has been just very hard for him.  He is in no condition to take chemotherapy today.  Is not hurting. He really feels well from a cancer point of view. He did have some vomiting with the last cycle of chemotherapy.  His CEA level has come down. Today, it was 20.  He's had no diarrhea. He's had no cough. There's been no leg swelling.     Medications: Current outpatient prescriptions:clorazepate (TRANXENE-T) 3.75 MG tablet, Take 1-2 pills, IF NEEDED, every 8 hrs for anxiety, Disp: 60 tablet, Rfl: 3;  dexamethasone (DECADRON) 4 MG tablet, Take 2 tablets (8 mg total) by mouth 2 (two) times daily with a meal. Start the day after chemotherapy for 3 days. Take with food., Disp: 30 tablet, Rfl: 1 ibuprofen (ADVIL,MOTRIN) 200 MG tablet, Take 200 mg by mouth every 6 (six) hours as needed., Disp: , Rfl: ;  lidocaine-prilocaine (EMLA) cream, Apply topically as needed., Disp: 30 g, Rfl: 3;  loperamide (IMODIUM A-D) 2 MG tablet, Take 2 at onset of diarrhea, then 1 every 2hrs until 12hr without a BM. May take 2 tab every 4hrs at bedtime. If diarrhea recurs repeat., Disp: 100 tablet, Rfl: 1 ondansetron (ZOFRAN) 8 MG tablet, Take 1 tablet (8 mg total) by mouth 2 (two) times daily. Start the day after chemo for 3 days. Then take  as needed for nausea or vomiting., Disp: 30 tablet, Rfl: 1;  prochlorperazine (COMPAZINE) 10 MG tablet, Take 1 tablet (10 mg total) by mouth every 6 (six) hours as needed (Nausea or vomiting)., Disp: 30 tablet, Rfl: 1 Pyridoxine HCl (VITAMIN B-6) 250 MG tablet, Take 1 tablet (250 mg total) by mouth daily., Disp: 30 tablet, Rfl: 6;  zolpidem (AMBIEN CR) 12.5 MG CR tablet, Take 12.5 mg by mouth at bedtime as needed for sleep. PT NEVER PICKED UP AT PHARMACY, Disp: , Rfl:   Allergies: No Known Allergies  Past Medical History, Surgical history, Social history, and Family History were reviewed and updated.  Review of Systems: As above  Physical Exam:  vitals were not taken for this visit.  Well-developed and well-nourished African American gentleman. He is in no physical distress. Vital signs are temperature of 97.6. Pulse 118. Blood pressure 119/92. Weight is 223 pounds. Head and neck exam shows no ocular or oral lesions. He has no palpable cervical or supraclavicular lymph nodes. Lungs are clear. Cardiac exam regular in rhythm with no murmurs rubs or bruits. Abdomen is soft. Well-healed laparotomy scar. There is no fluid wave. No palpable liver or spleen tip. Extremities shows no clubbing cyanosis or edema. Skin exam no rashes ecchymosis or petechia. Neurological exam shows no focal neurological deficits.  Lab Results  Component Value Date   WBC 5.7  12/31/2013   HGB 14.9 12/31/2013   HCT 46.0 12/31/2013   MCV 77* 12/31/2013   PLT 303 12/31/2013     Chemistry      Component Value Date/Time   NA 140 12/31/2013 0832   NA 141 10/01/2013 0935   K 3.7 12/31/2013 0832   K 4.2 10/01/2013 0935   CL 102 12/31/2013 0832   CL 106 10/01/2013 0935   CO2 27 12/31/2013 0832   CO2 25 10/01/2013 0935   BUN 14 12/31/2013 0832   BUN 13 10/01/2013 0935   CREATININE 1.1 12/31/2013 0832   CREATININE 0.95 10/01/2013 0935      Component Value Date/Time   CALCIUM 9.3 12/31/2013 0832   CALCIUM 9.4 10/01/2013 0935   ALKPHOS  84 12/31/2013 0832   ALKPHOS 80 10/01/2013 0935   AST 18 12/31/2013 0832   AST 14 10/01/2013 0935   ALT 25 12/31/2013 0832   ALT 15 10/01/2013 0935   BILITOT 0.60 12/31/2013 0832   BILITOT 0.3 10/01/2013 0935         Impression and Plan: David Conrad is 43 year old African American gentleman. His metastatic colon cancer. Again, he is just in no condition to take treatment today. I don't want to have him sick. He wants to be with his grandmother. I don't want to risk any infection with him.  I don't see any problems with holding off on his chemotherapy for another couple weeks. We will rescan him. We will get a PET scan set up. I may also consider a CT scan. His last scans were done at [facility] so we will have to somehow get those records.  I just feel bad for him. It's obvious that his grandmother is l basically his mom. She raised him. He really is taking as far. We'll pray. He does understand that she is going to be with God and this does give him some peace.  He felt better after our talk.  We'll see about getting him back in 2 weeks.  Volanda Napoleon, MD 5/14/20157:44 AM

## 2014-01-02 ENCOUNTER — Telehealth: Payer: Self-pay | Admitting: Hematology & Oncology

## 2014-01-02 NOTE — Telephone Encounter (Signed)
Left message on all valid numbers to call

## 2014-01-05 ENCOUNTER — Telehealth: Payer: Self-pay | Admitting: Hematology & Oncology

## 2014-01-05 NOTE — Telephone Encounter (Signed)
Left messages on all numbers with times of 5-22 and 6-3 appointments and to call for the details

## 2014-01-05 NOTE — Telephone Encounter (Signed)
Pt aware of 5-22 CT to be NPO 4 hrs and to drink contrast, he said he would go in 2 hrs early. He is also aware of 6-3 tx, he wanted to reschedule it because of a birthday but his wife said he was coming to that appointment

## 2014-01-07 ENCOUNTER — Ambulatory Visit: Payer: Medicaid Other | Admitting: Hematology & Oncology

## 2014-01-07 ENCOUNTER — Ambulatory Visit: Payer: Medicaid Other

## 2014-01-07 ENCOUNTER — Other Ambulatory Visit: Payer: Medicaid Other | Admitting: Lab

## 2014-01-09 ENCOUNTER — Telehealth: Payer: Self-pay | Admitting: Hematology & Oncology

## 2014-01-09 ENCOUNTER — Ambulatory Visit (HOSPITAL_COMMUNITY): Payer: Medicaid Other

## 2014-01-09 ENCOUNTER — Ambulatory Visit (HOSPITAL_COMMUNITY): Admission: RE | Admit: 2014-01-09 | Payer: Medicaid Other | Source: Ambulatory Visit

## 2014-01-09 NOTE — Telephone Encounter (Signed)
Pt moved 5-22 CT to 5-26 his wife is sick

## 2014-01-13 ENCOUNTER — Ambulatory Visit (HOSPITAL_COMMUNITY)
Admission: RE | Admit: 2014-01-13 | Discharge: 2014-01-13 | Disposition: A | Discharge status: Home / Self Care | Payer: Medicaid Other | Source: Ambulatory Visit | Attending: Hematology & Oncology | Admitting: Hematology & Oncology

## 2014-01-13 ENCOUNTER — Encounter (HOSPITAL_COMMUNITY): Payer: Self-pay

## 2014-01-13 DIAGNOSIS — K7689 Other specified diseases of liver: Secondary | ICD-10-CM | POA: Insufficient documentation

## 2014-01-13 DIAGNOSIS — J984 Other disorders of lung: Secondary | ICD-10-CM | POA: Insufficient documentation

## 2014-01-13 DIAGNOSIS — C189 Malignant neoplasm of colon, unspecified: Secondary | ICD-10-CM | POA: Insufficient documentation

## 2014-01-13 DIAGNOSIS — Z9221 Personal history of antineoplastic chemotherapy: Secondary | ICD-10-CM | POA: Insufficient documentation

## 2014-01-13 DIAGNOSIS — C786 Secondary malignant neoplasm of retroperitoneum and peritoneum: Secondary | ICD-10-CM | POA: Insufficient documentation

## 2014-01-13 DIAGNOSIS — C182 Malignant neoplasm of ascending colon: Secondary | ICD-10-CM

## 2014-01-13 MED ORDER — IOHEXOL 300 MG/ML  SOLN
100.0000 mL | Freq: Once | INTRAMUSCULAR | Status: AC | PRN
  Administered 2014-01-13: 100 mL via INTRAVENOUS

## 2014-01-13 MED ORDER — IOHEXOL 300 MG/ML  SOLN
50.0000 mL | Freq: Once | INTRAMUSCULAR | Status: AC | PRN
  Administered 2014-01-13: 50 mL via ORAL

## 2014-01-15 ENCOUNTER — Telehealth: Payer: Self-pay | Admitting: *Deleted

## 2014-01-15 NOTE — Telephone Encounter (Signed)
Message copied by Rico Ala on Thu Jan 15, 2014 12:53 PM ------      Message from: Burney Gauze R      Created: Wed Jan 14, 2014  5:53 PM       I called him and told him about the CT scan results. We are not seeing more activity. The liver still looks fine. He cancer growth is outside of the liver and we have to get him back on to treatment and try to keep him on schedule now.  he understands this. We will go ahead and proceed to start next week. Pete ------

## 2014-01-21 ENCOUNTER — Other Ambulatory Visit: Payer: Self-pay | Admitting: Oncology

## 2014-01-21 ENCOUNTER — Ambulatory Visit (HOSPITAL_BASED_OUTPATIENT_CLINIC_OR_DEPARTMENT_OTHER): Payer: Medicaid Other | Admitting: Hematology & Oncology

## 2014-01-21 ENCOUNTER — Other Ambulatory Visit (HOSPITAL_BASED_OUTPATIENT_CLINIC_OR_DEPARTMENT_OTHER): Payer: Medicaid Other | Admitting: Lab

## 2014-01-21 ENCOUNTER — Ambulatory Visit (HOSPITAL_BASED_OUTPATIENT_CLINIC_OR_DEPARTMENT_OTHER): Payer: Medicaid Other

## 2014-01-21 VITALS — BP 123/78 | HR 81 | Resp 20

## 2014-01-21 DIAGNOSIS — C182 Malignant neoplasm of ascending colon: Secondary | ICD-10-CM

## 2014-01-21 DIAGNOSIS — Z5111 Encounter for antineoplastic chemotherapy: Secondary | ICD-10-CM

## 2014-01-21 DIAGNOSIS — Z5112 Encounter for antineoplastic immunotherapy: Secondary | ICD-10-CM

## 2014-01-21 DIAGNOSIS — C779 Secondary and unspecified malignant neoplasm of lymph node, unspecified: Secondary | ICD-10-CM

## 2014-01-21 LAB — CBC WITH DIFFERENTIAL (CANCER CENTER ONLY)
BASO#: 0 10*3/uL (ref 0.0–0.2)
BASO%: 0.2 % (ref 0.0–2.0)
EOS%: 6.2 % (ref 0.0–7.0)
Eosinophils Absolute: 0.4 10*3/uL (ref 0.0–0.5)
HCT: 42.3 % (ref 38.7–49.9)
HGB: 13.8 g/dL (ref 13.0–17.1)
LYMPH#: 2.3 10*3/uL (ref 0.9–3.3)
LYMPH%: 39.8 % (ref 14.0–48.0)
MCH: 25.5 pg — AB (ref 28.0–33.4)
MCHC: 32.6 g/dL (ref 32.0–35.9)
MCV: 78 fL — ABNORMAL LOW (ref 82–98)
MONO#: 0.7 10*3/uL (ref 0.1–0.9)
MONO%: 12 % (ref 0.0–13.0)
NEUT%: 41.8 % (ref 40.0–80.0)
NEUTROS ABS: 2.4 10*3/uL (ref 1.5–6.5)
PLATELETS: 269 10*3/uL (ref 145–400)
RBC: 5.41 10*6/uL (ref 4.20–5.70)
RDW: 19.1 % — ABNORMAL HIGH (ref 11.1–15.7)
WBC: 5.8 10*3/uL (ref 4.0–10.0)

## 2014-01-21 LAB — CMP (CANCER CENTER ONLY)
ALBUMIN: 3.5 g/dL (ref 3.3–5.5)
ALK PHOS: 71 U/L (ref 26–84)
ALT(SGPT): 16 U/L (ref 10–47)
AST: 21 U/L (ref 11–38)
BUN: 13 mg/dL (ref 7–22)
CO2: 28 mEq/L (ref 18–33)
Calcium: 9.2 mg/dL (ref 8.0–10.3)
Chloride: 105 mEq/L (ref 98–108)
Creat: 1.2 mg/dl (ref 0.6–1.2)
Glucose, Bld: 135 mg/dL — ABNORMAL HIGH (ref 73–118)
POTASSIUM: 3.5 meq/L (ref 3.3–4.7)
Sodium: 142 mEq/L (ref 128–145)
Total Bilirubin: 0.6 mg/dl (ref 0.20–1.60)
Total Protein: 7.2 g/dL (ref 6.4–8.1)

## 2014-01-21 LAB — UA PROTEIN, DIPSTICK - CHCC SATELLITE: Protein, Urine: NEGATIVE mg/dL

## 2014-01-21 MED ORDER — SODIUM CHLORIDE 0.9 % IV SOLN
Freq: Once | INTRAVENOUS | Status: AC
  Administered 2014-01-21: 10:00:00 via INTRAVENOUS

## 2014-01-21 MED ORDER — FLUOROURACIL CHEMO INJECTION 2.5 GM/50ML
400.0000 mg/m2 | Freq: Once | INTRAVENOUS | Status: AC
  Administered 2014-01-21: 900 mg via INTRAVENOUS
  Filled 2014-01-21: qty 18

## 2014-01-21 MED ORDER — LORAZEPAM 1 MG PO TABS
1.0000 mg | ORAL_TABLET | Freq: Once | ORAL | Status: AC
  Administered 2014-01-21: 1 mg via ORAL

## 2014-01-21 MED ORDER — LIDOCAINE-PRILOCAINE 2.5-2.5 % EX CREA: TOPICAL_CREAM | CUTANEOUS | Status: AC | PRN

## 2014-01-21 MED ORDER — SODIUM CHLORIDE 0.9 % IV SOLN
5.0000 mg/kg | Freq: Once | INTRAVENOUS | Status: AC
  Administered 2014-01-21: 500 mg via INTRAVENOUS
  Filled 2014-01-21: qty 20

## 2014-01-21 MED ORDER — HEPARIN SOD (PORK) LOCK FLUSH 100 UNIT/ML IV SOLN
500.0000 [IU] | Freq: Once | INTRAVENOUS | Status: DC | PRN
  Filled 2014-01-21: qty 5

## 2014-01-21 MED ORDER — ATROPINE SULFATE 1 MG/ML IJ SOLN
INTRAMUSCULAR | Status: AC
  Filled 2014-01-21: qty 1

## 2014-01-21 MED ORDER — LORAZEPAM 1 MG PO TABS
ORAL_TABLET | ORAL | Status: AC
  Filled 2014-01-21: qty 1

## 2014-01-21 MED ORDER — SODIUM CHLORIDE 0.9 % IJ SOLN
10.0000 mL | INTRAMUSCULAR | Status: DC | PRN
  Filled 2014-01-21: qty 10

## 2014-01-21 MED ORDER — DEXTROSE 5 % IV SOLN
162.0000 mg/m2 | Freq: Once | INTRAVENOUS | Status: AC
  Administered 2014-01-21: 354 mg via INTRAVENOUS
  Filled 2014-01-21: qty 17.7

## 2014-01-21 MED ORDER — ATROPINE SULFATE 1 MG/ML IJ SOLN
0.5000 mg | Freq: Once | INTRAMUSCULAR | Status: AC | PRN
  Administered 2014-01-21: 0.5 mg via INTRAVENOUS

## 2014-01-21 MED ORDER — ONDANSETRON 16 MG/50ML IVPB (CHCC)
16.0000 mg | Freq: Once | INTRAVENOUS | Status: AC
  Administered 2014-01-21: 16 mg via INTRAVENOUS

## 2014-01-21 MED ORDER — DEXAMETHASONE SODIUM PHOSPHATE 20 MG/5ML IJ SOLN
INTRAMUSCULAR | Status: AC
  Filled 2014-01-21: qty 5

## 2014-01-21 MED ORDER — DEXAMETHASONE SODIUM PHOSPHATE 20 MG/5ML IJ SOLN
20.0000 mg | Freq: Once | INTRAMUSCULAR | Status: AC
  Administered 2014-01-21: 20 mg via INTRAVENOUS

## 2014-01-21 MED ORDER — DEXTROSE 5 % IV SOLN
400.0000 mg/m2 | Freq: Once | INTRAVENOUS | Status: AC
  Administered 2014-01-21: 876 mg via INTRAVENOUS
  Filled 2014-01-21: qty 43.8

## 2014-01-21 MED ORDER — SODIUM CHLORIDE 0.9 % IV SOLN
2400.0000 mg/m2 | INTRAVENOUS | Status: DC
  Administered 2014-01-21: 5250 mg via INTRAVENOUS
  Filled 2014-01-21: qty 105

## 2014-01-21 MED ORDER — ONDANSETRON 16 MG/50ML IVPB (CHCC)
INTRAVENOUS | Status: AC
  Filled 2014-01-21: qty 16

## 2014-01-21 NOTE — Patient Instructions (Signed)
[facility] Discharge Instructions for Patients Receiving Chemotherapy  Today you received the following chemotherapy agents Avastin, Camptosar, Leucovorin, 5FU.  To help prevent nausea and vomiting after your treatment, we encourage you to take your nausea medication as prescribed.    If you develop nausea and vomiting that is not controlled by your nausea medication, call the clinic.   BELOW ARE SYMPTOMS THAT SHOULD BE REPORTED IMMEDIATELY:  *FEVER GREATER THAN 100.5 F  *CHILLS WITH OR WITHOUT FEVER  NAUSEA AND VOMITING THAT IS NOT CONTROLLED WITH YOUR NAUSEA MEDICATION  *UNUSUAL SHORTNESS OF BREATH  *UNUSUAL BRUISING OR BLEEDING  TENDERNESS IN MOUTH AND THROAT WITH OR WITHOUT PRESENCE OF ULCERS  *URINARY PROBLEMS  *BOWEL PROBLEMS  UNUSUAL RASH Items with * indicate a potential emergency and should be followed up as soon as possible.  Feel free to call the clinic you have any questions or concerns. The clinic phone number is [phone].    

## 2014-01-21 NOTE — Progress Notes (Signed)
Hematology and Oncology Follow Up Visit  David Conrad [id number] [birth year] 43 y.o. 01/21/2014   Principle Diagnosis:  Metastatic colon cancer- KRAS wild type  Current Therapy:   Status post 2 cycles of FOLFIR/Avastin      Interim History:  Mr.  Conrad is back for followup. We finally have gotten back. His been probably a month since he had any chemotherapy. He's had a lot of issues at home. His grandmother recently passed away. There's been issues with his oldest son. He's been having a lot of psychological problems.  David Conrad this is not felt that he couldn't take any kind of treatment while to involve the issues.  We did go ahead and get a followup CT scan. This did show progressive disease. This really is no surprise. There is a cell capsular implant in the right hepatic lobe measuring 2.8 x 1.3 cm. He has a mesenteric implant on the right side measuring 9.5 x 6.2 cm. There is a midline mesenteric implant measuring 6.8 x 3.5 cm. A right lower quadrant implant measures 6 x 5.2 cm.  He is totally asymptomatic. He's had no nausea vomiting. His been no bowel pain present no problems with his bowels or bladder. He's had no leg swelling. He's had no rashes. He's had no cough.  He still is quite emotional. Again, he just had a tough time with family issues.  His last CEA was stable at 20.  Medications: Current outpatient prescriptions:clorazepate (TRANXENE-T) 3.75 MG tablet, Take 1-2 pills, IF NEEDED, every 8 hrs for anxiety, Disp: 60 tablet, Rfl: 3;  dexamethasone (DECADRON) 4 MG tablet, Take 2 tablets (8 mg total) by mouth 2 (two) times daily with a meal. Start the day after chemotherapy for 3 days. Take with food., Disp: 30 tablet, Rfl: 1 ibuprofen (ADVIL,MOTRIN) 200 MG tablet, Take 200 mg by mouth every 6 (six) hours as needed., Disp: , Rfl: ;  lidocaine-prilocaine (EMLA) cream, Apply topically as needed., Disp: 30 g, Rfl: 3;  loperamide (IMODIUM A-D) 2 MG tablet, Take 2 at onset of  diarrhea, then 1 every 2hrs until 12hr without a BM. May take 2 tab every 4hrs at bedtime. If diarrhea recurs repeat., Disp: 100 tablet, Rfl: 1 ondansetron (ZOFRAN) 8 MG tablet, Take 1 tablet (8 mg total) by mouth 2 (two) times daily. Start the day after chemo for 3 days. Then take as needed for nausea or vomiting., Disp: 30 tablet, Rfl: 1;  prochlorperazine (COMPAZINE) 10 MG tablet, Take 1 tablet (10 mg total) by mouth every 6 (six) hours as needed (Nausea or vomiting)., Disp: 30 tablet, Rfl: 1 Pyridoxine HCl (VITAMIN B-6) 250 MG tablet, Take 1 tablet (250 mg total) by mouth daily., Disp: 30 tablet, Rfl: 6;  zolpidem (AMBIEN CR) 12.5 MG CR tablet, Take 12.5 mg by mouth at bedtime as needed for sleep. PT NEVER PICKED UP AT PHARMACY, Disp: , Rfl:   Allergies: No Known Allergies  Past Medical History, Surgical history, Social history, and Family History were reviewed and updated.  Review of Systems: As above  Physical Exam:  vitals were not taken for this visit.  Well-developed and well-nourished African American gentleman. His head and neck exam shows no ocular or oral lesion. He has no palpable cervical or supraclavicular lymph nodes. Lungs are clear. Cardiac exam regular rate rhythm with no murmurs rubs or bruits. Abdomen is soft. Has good bowel sounds. There is no fluid wave. There is no fullness. Is no abdominal mass. Extremities shows no clubbing cyanosis  or edema. Skin exam no rashes. Neurological exam is nonfocal.  Lab Results  Component Value Date   WBC 5.8 01/21/2014   HGB 13.8 01/21/2014   HCT 42.3 01/21/2014   MCV 78* 01/21/2014   PLT 269 01/21/2014     Chemistry      Component Value Date/Time   NA 142 01/21/2014 0833   NA 141 10/01/2013 0935   K 3.5 01/21/2014 0833   K 4.2 10/01/2013 0935   CL 105 01/21/2014 0833   CL 106 10/01/2013 0935   CO2 28 01/21/2014 0833   CO2 25 10/01/2013 0935   BUN 13 01/21/2014 0833   BUN 13 10/01/2013 0935   CREATININE 1.2 01/21/2014 0833   CREATININE 0.95  10/01/2013 0935      Component Value Date/Time   CALCIUM 9.2 01/21/2014 0833   CALCIUM 9.4 10/01/2013 0935   ALKPHOS 71 01/21/2014 0833   ALKPHOS 80 10/01/2013 0935   AST 21 01/21/2014 0833   AST 14 10/01/2013 0935   ALT 16 01/21/2014 0833   ALT 15 10/01/2013 0935   BILITOT 0.60 01/21/2014 0833   BILITOT 0.3 10/01/2013 0935         Impression and Plan: David Conrad is 43 year old gentleman with metastatic colon cancer. Again, he is KRAS wild type.  It is clear that we really must keep on schedule now. I just don't see any other way that we can get around this. His disease is progressing. He still asymptomatic. However, if his disease continues to progress, then I don't see that we're going to be able to make much improvement. I would worry about him getting obstructed.  I talked to him for about 40 minutes or so. I explained to him that we really have to keep him on schedule. We will have to try to maintain the doses. Hopefully, he will not get sick.  He really is trying his best. He just has a lot going on and I know that this is very tough on him.   Volanda Napoleon, MD 6/3/20156:13 PM

## 2014-01-21 NOTE — Progress Notes (Signed)
12:11 PM Patient C/O chest discomfort after returning from bathroom. V.S. taken Dr. Marin Olp notified and assessed patient at chairside, lung sounds clear. Ativan ordered and treatment continued.  14:40 Treatment completed without any further concerns.

## 2014-01-22 LAB — CEA: CEA: 19.8 ng/mL — ABNORMAL HIGH (ref 0.0–5.0)

## 2014-01-23 ENCOUNTER — Ambulatory Visit (HOSPITAL_BASED_OUTPATIENT_CLINIC_OR_DEPARTMENT_OTHER): Payer: Medicaid Other

## 2014-01-23 VITALS — BP 120/87 | HR 92 | Temp 97.5°F | Resp 18

## 2014-01-23 DIAGNOSIS — C779 Secondary and unspecified malignant neoplasm of lymph node, unspecified: Secondary | ICD-10-CM

## 2014-01-23 DIAGNOSIS — Z452 Encounter for adjustment and management of vascular access device: Secondary | ICD-10-CM

## 2014-01-23 DIAGNOSIS — C189 Malignant neoplasm of colon, unspecified: Secondary | ICD-10-CM

## 2014-01-23 DIAGNOSIS — C182 Malignant neoplasm of ascending colon: Secondary | ICD-10-CM

## 2014-01-23 MED ORDER — SODIUM CHLORIDE 0.9 % IJ SOLN
10.0000 mL | INTRAMUSCULAR | Status: DC | PRN
  Administered 2014-01-23: 10 mL via INTRAVENOUS
  Filled 2014-01-23: qty 10

## 2014-01-23 MED ORDER — HEPARIN SOD (PORK) LOCK FLUSH 100 UNIT/ML IV SOLN
500.0000 [IU] | Freq: Once | INTRAVENOUS | Status: AC
  Administered 2014-01-23: 500 [IU] via INTRAVENOUS
  Filled 2014-01-23: qty 5

## 2014-01-23 NOTE — Patient Instructions (Signed)
Fluorouracil, 5-FU injection What is this medicine? FLUOROURACIL, 5-FU (flure oh YOOR a sil) is a chemotherapy drug. It slows the growth of cancer cells. This medicine is used to treat many types of cancer like breast cancer, colon or rectal cancer, pancreatic cancer, and stomach cancer. This medicine may be used for other purposes; ask your health care provider or pharmacist if you have questions. COMMON BRAND NAME(S): Adrucil What should I tell my health care provider before I take this medicine? They need to know if you have any of these conditions: -blood disorders -dihydropyrimidine dehydrogenase (DPD) deficiency -infection (especially a virus infection such as chickenpox, cold sores, or herpes) -kidney disease -liver disease -malnourished, poor nutrition -recent or ongoing radiation therapy -an unusual or allergic reaction to fluorouracil, other chemotherapy, other medicines, foods, dyes, or preservatives -pregnant or trying to get pregnant -breast-feeding How should I use this medicine? This drug is given as an infusion or injection into a vein. It is administered in a hospital or clinic by a specially trained health care professional. Talk to your pediatrician regarding the use of this medicine in children. Special care may be needed. Overdosage: If you think you have taken too much of this medicine contact a poison control center or emergency room at once. NOTE: This medicine is only for you. Do not share this medicine with others. What if I miss a dose? It is important not to miss your dose. Call your doctor or health care professional if you are unable to keep an appointment. What may interact with this medicine? -allopurinol -cimetidine -dapsone -digoxin -hydroxyurea -leucovorin -levamisole -medicines for seizures like ethotoin, fosphenytoin, phenytoin -medicines to increase blood counts like filgrastim, pegfilgrastim, sargramostim -medicines that treat or prevent blood  clots like warfarin, enoxaparin, and dalteparin -methotrexate -metronidazole -pyrimethamine -some other chemotherapy drugs like busulfan, cisplatin, estramustine, vinblastine -trimethoprim -trimetrexate -vaccines Talk to your doctor or health care professional before taking any of these medicines: -acetaminophen -aspirin -ibuprofen -ketoprofen -naproxen This list may not describe all possible interactions. Give your health care provider a list of all the medicines, herbs, non-prescription drugs, or dietary supplements you use. Also tell them if you smoke, drink alcohol, or use illegal drugs. Some items may interact with your medicine. What should I watch for while using this medicine? Visit your doctor for checks on your progress. This drug may make you feel generally unwell. This is not uncommon, as chemotherapy can affect healthy cells as well as cancer cells. Report any side effects. Continue your course of treatment even though you feel ill unless your doctor tells you to stop. In some cases, you may be given additional medicines to help with side effects. Follow all directions for their use. Call your doctor or health care professional for advice if you get a fever, chills or sore throat, or other symptoms of a cold or flu. Do not treat yourself. This drug decreases your body's ability to fight infections. Try to avoid being around people who are sick. This medicine may increase your risk to bruise or bleed. Call your doctor or health care professional if you notice any unusual bleeding. Be careful brushing and flossing your teeth or using a toothpick because you may get an infection or bleed more easily. If you have any dental work done, tell your dentist you are receiving this medicine. Avoid taking products that contain aspirin, acetaminophen, ibuprofen, naproxen, or ketoprofen unless instructed by your doctor. These medicines may hide a fever. Do not become pregnant while taking this  medicine. Women should inform their doctor if they wish to become pregnant or think they might be pregnant. There is a potential for serious side effects to an unborn child. Talk to your health care professional or pharmacist for more information. Do not breast-feed an infant while taking this medicine. Men should inform their doctor if they wish to father a child. This medicine may lower sperm counts. Do not treat diarrhea with over the counter products. Contact your doctor if you have diarrhea that lasts more than 2 days or if it is severe and watery. This medicine can make you more sensitive to the sun. Keep out of the sun. If you cannot avoid being in the sun, wear protective clothing and use sunscreen. Do not use sun lamps or tanning beds/booths. What side effects may I notice from receiving this medicine? Side effects that you should report to your doctor or health care professional as soon as possible: -allergic reactions like skin rash, itching or hives, swelling of the face, lips, or tongue -low blood counts - this medicine may decrease the number of white blood cells, red blood cells and platelets. You may be at increased risk for infections and bleeding. -signs of infection - fever or chills, cough, sore throat, pain or difficulty passing urine -signs of decreased platelets or bleeding - bruising, pinpoint red spots on the skin, black, tarry stools, blood in the urine -signs of decreased red blood cells - unusually weak or tired, fainting spells, lightheadedness -breathing problems -changes in vision -chest pain -mouth sores -nausea and vomiting -pain, swelling, redness at site where injected -pain, tingling, numbness in the hands or feet -redness, swelling, or sores on hands or feet -stomach pain -unusual bleeding Side effects that usually do not require medical attention (report to your doctor or health care professional if they continue or are bothersome): -changes in finger or  toe nails -diarrhea -dry or itchy skin -hair loss -headache -loss of appetite -sensitivity of eyes to the light -stomach upset -unusually teary eyes This list may not describe all possible side effects. Call your doctor for medical advice about side effects. You may report side effects to FDA at 1-800-FDA-1088. Where should I keep my medicine? This drug is given in a hospital or clinic and will not be stored at home. NOTE: This sheet is a summary. It may not cover all possible information. If you have questions about this medicine, talk to your doctor, pharmacist, or health care provider.  2014, [employer] Standard. (2007-12-11 13:53:16)

## 2014-02-04 ENCOUNTER — Ambulatory Visit: Payer: Medicaid Other

## 2014-02-04 ENCOUNTER — Other Ambulatory Visit (HOSPITAL_BASED_OUTPATIENT_CLINIC_OR_DEPARTMENT_OTHER): Payer: Medicaid Other | Admitting: Lab

## 2014-02-04 ENCOUNTER — Ambulatory Visit (HOSPITAL_BASED_OUTPATIENT_CLINIC_OR_DEPARTMENT_OTHER): Payer: Medicaid Other | Admitting: Hematology & Oncology

## 2014-02-04 DIAGNOSIS — C182 Malignant neoplasm of ascending colon: Secondary | ICD-10-CM

## 2014-02-04 LAB — CBC WITH DIFFERENTIAL (CANCER CENTER ONLY)
BASO#: 0 10*3/uL (ref 0.0–0.2)
BASO%: 0.4 % (ref 0.0–2.0)
EOS ABS: 0.2 10*3/uL (ref 0.0–0.5)
EOS%: 4.8 % (ref 0.0–7.0)
HCT: 43.1 % (ref 38.7–49.9)
HGB: 14.1 g/dL (ref 13.0–17.1)
LYMPH#: 2.2 10*3/uL (ref 0.9–3.3)
LYMPH%: 46.9 % (ref 14.0–48.0)
MCH: 25.6 pg — AB (ref 28.0–33.4)
MCHC: 32.7 g/dL (ref 32.0–35.9)
MCV: 78 fL — AB (ref 82–98)
MONO#: 0.6 10*3/uL (ref 0.1–0.9)
MONO%: 12.8 % (ref 0.0–13.0)
NEUT#: 1.6 10*3/uL (ref 1.5–6.5)
NEUT%: 35.1 % — AB (ref 40.0–80.0)
Platelets: 221 10*3/uL (ref 145–400)
RBC: 5.5 10*6/uL (ref 4.20–5.70)
RDW: 19.2 % — ABNORMAL HIGH (ref 11.1–15.7)
WBC: 4.6 10*3/uL (ref 4.0–10.0)

## 2014-02-04 NOTE — Progress Notes (Signed)
02-04-14  Patient spoke with Dr. Marin Olp today when he was having blood drawn.

## 2014-02-05 LAB — COMPREHENSIVE METABOLIC PANEL
ALBUMIN: 4 g/dL (ref 3.5–5.2)
ALT: 18 U/L (ref 0–53)
AST: 14 U/L (ref 0–37)
Alkaline Phosphatase: 75 U/L (ref 39–117)
BILIRUBIN TOTAL: 0.4 mg/dL (ref 0.2–1.2)
BUN: 12 mg/dL (ref 6–23)
CALCIUM: 9.1 mg/dL (ref 8.4–10.5)
CO2: 24 meq/L (ref 19–32)
Chloride: 105 mEq/L (ref 96–112)
Creatinine, Ser: 1.02 mg/dL (ref 0.50–1.35)
GLUCOSE: 105 mg/dL — AB (ref 70–99)
Potassium: 4.1 mEq/L (ref 3.5–5.3)
SODIUM: 139 meq/L (ref 135–145)
TOTAL PROTEIN: 6.5 g/dL (ref 6.0–8.3)

## 2014-02-05 LAB — CEA: CEA: 17.7 ng/mL — ABNORMAL HIGH (ref 0.0–5.0)
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
## 2014-02-11 ENCOUNTER — Telehealth: Payer: Self-pay | Admitting: Hematology & Oncology
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
## 2014-02-18 ENCOUNTER — Ambulatory Visit: Payer: Medicaid Other

## 2014-02-18 ENCOUNTER — Ambulatory Visit: Payer: Medicaid Other | Admitting: Hematology & Oncology

## 2014-02-18 ENCOUNTER — Other Ambulatory Visit: Payer: Medicaid Other | Admitting: Lab

## 2014-02-23 ENCOUNTER — Ambulatory Visit (HOSPITAL_BASED_OUTPATIENT_CLINIC_OR_DEPARTMENT_OTHER): Payer: Medicaid Other

## 2014-02-23 ENCOUNTER — Other Ambulatory Visit (HOSPITAL_BASED_OUTPATIENT_CLINIC_OR_DEPARTMENT_OTHER): Payer: Medicaid Other | Admitting: Lab

## 2014-02-23 VITALS — BP 128/91 | HR 86 | Temp 98.5°F | Resp 18

## 2014-02-23 DIAGNOSIS — Z5112 Encounter for antineoplastic immunotherapy: Secondary | ICD-10-CM

## 2014-02-23 DIAGNOSIS — Z5111 Encounter for antineoplastic chemotherapy: Secondary | ICD-10-CM

## 2014-02-23 DIAGNOSIS — C182 Malignant neoplasm of ascending colon: Secondary | ICD-10-CM

## 2014-02-23 LAB — CBC WITH DIFFERENTIAL (CANCER CENTER ONLY)
BASO#: 0 10*3/uL (ref 0.0–0.2)
BASO%: 0.2 % (ref 0.0–2.0)
EOS%: 4.4 % (ref 0.0–7.0)
Eosinophils Absolute: 0.3 10*3/uL (ref 0.0–0.5)
HCT: 43.7 % (ref 38.7–49.9)
HGB: 14.2 g/dL (ref 13.0–17.1)
LYMPH#: 2.2 10*3/uL (ref 0.9–3.3)
LYMPH%: 35.6 % (ref 14.0–48.0)
MCH: 25.8 pg — ABNORMAL LOW (ref 28.0–33.4)
MCHC: 32.5 g/dL (ref 32.0–35.9)
MCV: 79 fL — AB (ref 82–98)
MONO#: 1.1 10*3/uL — AB (ref 0.1–0.9)
MONO%: 18 % — ABNORMAL HIGH (ref 0.0–13.0)
NEUT%: 41.8 % (ref 40.0–80.0)
NEUTROS ABS: 2.6 10*3/uL (ref 1.5–6.5)
PLATELETS: 285 10*3/uL (ref 145–400)
RBC: 5.51 10*6/uL (ref 4.20–5.70)
RDW: 19.3 % — ABNORMAL HIGH (ref 11.1–15.7)
WBC: 6.1 10*3/uL (ref 4.0–10.0)

## 2014-02-23 LAB — CMP (CANCER CENTER ONLY)
ALT: 12 U/L (ref 10–47)
AST: 20 U/L (ref 11–38)
Albumin: 3.4 g/dL (ref 3.3–5.5)
Alkaline Phosphatase: 66 U/L (ref 26–84)
BUN, Bld: 9 mg/dL (ref 7–22)
CALCIUM: 8.9 mg/dL (ref 8.0–10.3)
CHLORIDE: 101 meq/L (ref 98–108)
CO2: 28 meq/L (ref 18–33)
Creat: 1 mg/dl (ref 0.6–1.2)
Glucose, Bld: 78 mg/dL (ref 73–118)
Potassium: 3.8 mEq/L (ref 3.3–4.7)
SODIUM: 140 meq/L (ref 128–145)
Total Bilirubin: 0.5 mg/dl (ref 0.20–1.60)
Total Protein: 7.2 g/dL (ref 6.4–8.1)

## 2014-02-23 LAB — CEA: CEA: 15.7 ng/mL — AB (ref 0.0–5.0)

## 2014-02-23 MED ORDER — SODIUM CHLORIDE 0.9 % IJ SOLN
10.0000 mL | INTRAMUSCULAR | Status: DC | PRN
  Filled 2014-02-23: qty 10

## 2014-02-23 MED ORDER — SODIUM CHLORIDE 0.9 % IV SOLN
Freq: Once | INTRAVENOUS | Status: AC
  Administered 2014-02-23: 11:00:00 via INTRAVENOUS

## 2014-02-23 MED ORDER — ONDANSETRON 16 MG/50ML IVPB (CHCC)
16.0000 mg | Freq: Once | INTRAVENOUS | Status: AC
  Administered 2014-02-23: 16 mg via INTRAVENOUS

## 2014-02-23 MED ORDER — DEXAMETHASONE SODIUM PHOSPHATE 20 MG/5ML IJ SOLN
20.0000 mg | Freq: Once | INTRAMUSCULAR | Status: AC
  Administered 2014-02-23: 20 mg via INTRAVENOUS

## 2014-02-23 MED ORDER — HEPARIN SOD (PORK) LOCK FLUSH 100 UNIT/ML IV SOLN
500.0000 [IU] | Freq: Once | INTRAVENOUS | Status: DC | PRN
  Filled 2014-02-23: qty 5

## 2014-02-23 MED ORDER — ONDANSETRON 16 MG/50ML IVPB (CHCC)
INTRAVENOUS | Status: AC
  Filled 2014-02-23: qty 16

## 2014-02-23 MED ORDER — SODIUM CHLORIDE 0.9 % IV SOLN
2400.0000 mg/m2 | INTRAVENOUS | Status: DC
  Administered 2014-02-23: 5250 mg via INTRAVENOUS
  Filled 2014-02-23: qty 105

## 2014-02-23 MED ORDER — FLUOROURACIL CHEMO INJECTION 2.5 GM/50ML
400.0000 mg/m2 | Freq: Once | INTRAVENOUS | Status: AC
  Administered 2014-02-23: 900 mg via INTRAVENOUS
  Filled 2014-02-23: qty 18

## 2014-02-23 MED ORDER — SODIUM CHLORIDE 0.9 % IV SOLN
5.0000 mg/kg | Freq: Once | INTRAVENOUS | Status: AC
  Administered 2014-02-23: 500 mg via INTRAVENOUS
  Filled 2014-02-23: qty 20

## 2014-02-23 MED ORDER — DEXTROSE 5 % IV SOLN
400.0000 mg/m2 | Freq: Once | INTRAVENOUS | Status: AC
  Administered 2014-02-23: 876 mg via INTRAVENOUS
  Filled 2014-02-23: qty 43.8

## 2014-02-23 MED ORDER — ATROPINE SULFATE 1 MG/ML IJ SOLN
0.5000 mg | Freq: Once | INTRAMUSCULAR | Status: AC | PRN
  Administered 2014-02-23: 0.5 mg via INTRAVENOUS

## 2014-02-23 MED ORDER — IRINOTECAN HCL CHEMO INJECTION 100 MG/5ML
162.0000 mg/m2 | Freq: Once | INTRAVENOUS | Status: AC
  Administered 2014-02-23: 354 mg via INTRAVENOUS
  Filled 2014-02-23: qty 5.9

## 2014-02-23 MED ORDER — DEXAMETHASONE SODIUM PHOSPHATE 20 MG/5ML IJ SOLN
INTRAMUSCULAR | Status: AC
  Filled 2014-02-23: qty 5

## 2014-02-23 MED ORDER — ATROPINE SULFATE 1 MG/ML IJ SOLN
INTRAMUSCULAR | Status: AC
  Filled 2014-02-23: qty 1

## 2014-02-23 NOTE — Patient Instructions (Signed)
[facility] Discharge Instructions for Patients Receiving Chemotherapy  Today you received the following chemotherapy agents Avastin, Camptosar, Leucovorin, 5FU.  To help prevent nausea and vomiting after your treatment, we encourage you to take your nausea medication as prescribed.    If you develop nausea and vomiting that is not controlled by your nausea medication, call the clinic.   BELOW ARE SYMPTOMS THAT SHOULD BE REPORTED IMMEDIATELY:  *FEVER GREATER THAN 100.5 F  *CHILLS WITH OR WITHOUT FEVER  NAUSEA AND VOMITING THAT IS NOT CONTROLLED WITH YOUR NAUSEA MEDICATION  *UNUSUAL SHORTNESS OF BREATH  *UNUSUAL BRUISING OR BLEEDING  TENDERNESS IN MOUTH AND THROAT WITH OR WITHOUT PRESENCE OF ULCERS  *URINARY PROBLEMS  *BOWEL PROBLEMS  UNUSUAL RASH Items with * indicate a potential emergency and should be followed up as soon as possible.  Feel free to call the clinic you have any questions or concerns. The clinic phone number is [phone].

## 2014-02-25 ENCOUNTER — Ambulatory Visit (HOSPITAL_BASED_OUTPATIENT_CLINIC_OR_DEPARTMENT_OTHER): Payer: Medicaid Other

## 2014-02-25 VITALS — BP 129/90 | HR 85 | Temp 97.1°F | Resp 18

## 2014-02-25 DIAGNOSIS — C182 Malignant neoplasm of ascending colon: Secondary | ICD-10-CM

## 2014-02-25 DIAGNOSIS — Z452 Encounter for adjustment and management of vascular access device: Secondary | ICD-10-CM

## 2014-02-25 MED ORDER — HEPARIN SOD (PORK) LOCK FLUSH 100 UNIT/ML IV SOLN
250.0000 [IU] | Freq: Once | INTRAVENOUS | Status: DC | PRN
  Filled 2014-02-25: qty 5

## 2014-02-25 MED ORDER — SODIUM CHLORIDE 0.9 % IJ SOLN
10.0000 mL | INTRAMUSCULAR | Status: DC | PRN
  Administered 2014-02-25: 10 mL
  Filled 2014-02-25: qty 10

## 2014-02-25 MED ORDER — COLD PACK MISC ONCOLOGY
1.0000 | Freq: Once | Status: DC | PRN
Start: 2014-02-25 — End: 2014-02-25
  Filled 2014-02-25: qty 1

## 2014-02-25 MED ORDER — HEPARIN SOD (PORK) LOCK FLUSH 100 UNIT/ML IV SOLN
500.0000 [IU] | Freq: Once | INTRAVENOUS | Status: AC | PRN
  Administered 2014-02-25: 500 [IU]
  Filled 2014-02-25: qty 5

## 2014-02-25 MED ORDER — ALTEPLASE 2 MG IJ SOLR
2.0000 mg | Freq: Once | INTRAMUSCULAR | Status: DC | PRN
  Filled 2014-02-25: qty 2

## 2014-02-25 MED ORDER — SODIUM CHLORIDE 0.9 % IJ SOLN
3.0000 mL | INTRAMUSCULAR | Status: DC | PRN
  Filled 2014-02-25: qty 10

## 2014-02-25 NOTE — Patient Instructions (Signed)

## 2014-03-26 ENCOUNTER — Telehealth: Payer: Self-pay | Admitting: Hematology & Oncology

## 2014-03-26 NOTE — Telephone Encounter (Signed)
Left messages on both working lines to call for appointment

## 2014-03-26 NOTE — Telephone Encounter (Signed)
Pt made 8-11 appointment

## 2014-03-31 ENCOUNTER — Ambulatory Visit: Payer: Medicaid Other

## 2014-03-31 ENCOUNTER — Ambulatory Visit (HOSPITAL_BASED_OUTPATIENT_CLINIC_OR_DEPARTMENT_OTHER): Payer: Medicaid Other | Admitting: Hematology & Oncology

## 2014-03-31 ENCOUNTER — Other Ambulatory Visit (HOSPITAL_BASED_OUTPATIENT_CLINIC_OR_DEPARTMENT_OTHER): Payer: Medicaid Other | Admitting: Lab

## 2014-03-31 VITALS — BP 124/93 | HR 103 | Temp 97.7°F | Resp 20 | Ht 67.0 in | Wt 232.0 lb

## 2014-03-31 DIAGNOSIS — C182 Malignant neoplasm of ascending colon: Secondary | ICD-10-CM

## 2014-03-31 DIAGNOSIS — C786 Secondary malignant neoplasm of retroperitoneum and peritoneum: Secondary | ICD-10-CM

## 2014-03-31 LAB — CMP (CANCER CENTER ONLY)
ALBUMIN: 3.5 g/dL (ref 3.3–5.5)
ALK PHOS: 71 U/L (ref 26–84)
ALT(SGPT): 16 U/L (ref 10–47)
AST: 20 U/L (ref 11–38)
BUN, Bld: 11 mg/dL (ref 7–22)
CO2: 27 mEq/L (ref 18–33)
Calcium: 9.2 mg/dL (ref 8.0–10.3)
Chloride: 102 mEq/L (ref 98–108)
Creat: 1.2 mg/dl (ref 0.6–1.2)
Glucose, Bld: 88 mg/dL (ref 73–118)
POTASSIUM: 3.3 meq/L (ref 3.3–4.7)
SODIUM: 142 meq/L (ref 128–145)
TOTAL PROTEIN: 7.4 g/dL (ref 6.4–8.1)
Total Bilirubin: 0.5 mg/dl (ref 0.20–1.60)

## 2014-03-31 LAB — CBC WITH DIFFERENTIAL (CANCER CENTER ONLY)
BASO#: 0 10*3/uL (ref 0.0–0.2)
BASO%: 0.2 % (ref 0.0–2.0)
EOS%: 4.3 % (ref 0.0–7.0)
Eosinophils Absolute: 0.2 10*3/uL (ref 0.0–0.5)
HEMATOCRIT: 45.2 % (ref 38.7–49.9)
HGB: 14.7 g/dL (ref 13.0–17.1)
LYMPH#: 1.9 10*3/uL (ref 0.9–3.3)
LYMPH%: 39.1 % (ref 14.0–48.0)
MCH: 26.2 pg — ABNORMAL LOW (ref 28.0–33.4)
MCHC: 32.5 g/dL (ref 32.0–35.9)
MCV: 80 fL — ABNORMAL LOW (ref 82–98)
MONO#: 0.9 10*3/uL (ref 0.1–0.9)
MONO%: 18.6 % — ABNORMAL HIGH (ref 0.0–13.0)
NEUT#: 1.9 10*3/uL (ref 1.5–6.5)
NEUT%: 37.8 % — AB (ref 40.0–80.0)
PLATELETS: 280 10*3/uL (ref 145–400)
RBC: 5.62 10*6/uL (ref 4.20–5.70)
RDW: 17.3 % — AB (ref 11.1–15.7)
WBC: 4.9 10*3/uL (ref 4.0–10.0)

## 2014-03-31 LAB — LACTATE DEHYDROGENASE: LDH: 153 U/L (ref 94–250)

## 2014-03-31 LAB — CEA: CEA: 13.2 ng/mL — ABNORMAL HIGH (ref 0.0–5.0)

## 2014-04-01 NOTE — Progress Notes (Signed)
Hematology and Oncology Follow Up Visit  David Conrad [id number] 27-Oct-1970 43 y.o. 04/01/2014   Principle Diagnosis:  Metastatic colon cancer- KRAS wild type  Current Therapy:   Status post 3 cycles of FOLFIR/Avastin     Interim History:  Mr.  [name] is back for followup.It has been very difficult to get him into the office. He does have a whole on going on with his family. There've been new issues that he has had to deal with. This is been incredibly hard on him. Thankfully, a lot of these issues have been resolved He just found out that he does have a another child. He has a 34 year old daughter. This has been a shock to him.  He has not had any therapy for over 2 months. Again, he does have a lot of family issues and he just was not mentally ready to be able to take treatment.  He is totally asymptomatic. He has not had abdominal pain. He's eating well. He has not lost weight. He's had no nausea vomiting. He's had no change in bowel or bladder habits. He's had no leg swelling. There's been no rashes.  Shockingly, his CEA has been coming down without any therapy. When we last saw him, his CEA was 15.7.  Overall, his performance status is ECOG 1. Medications: Current outpatient prescriptions:clorazepate (TRANXENE-T) 3.75 MG tablet, Take 1-2 pills, IF NEEDED, every 8 hrs for anxiety, Disp: 60 tablet, Rfl: 3;  dexamethasone (DECADRON) 4 MG tablet, Take 2 tablets (8 mg total) by mouth 2 (two) times daily with a meal. Start the day after chemotherapy for 3 days. Take with food., Disp: 30 tablet, Rfl: 1 ibuprofen (ADVIL,MOTRIN) 200 MG tablet, Take 200 mg by mouth every 6 (six) hours as needed., Disp: , Rfl: ;  lidocaine-prilocaine (EMLA) cream, Apply topically as needed., Disp: 30 g, Rfl: 3;  loperamide (IMODIUM A-D) 2 MG tablet, Take 2 at onset of diarrhea, then 1 every 2hrs until 12hr without a BM. May take 2 tab every 4hrs at bedtime. If diarrhea recurs repeat., Disp: 100 tablet, Rfl:  1 ondansetron (ZOFRAN) 8 MG tablet, Take 1 tablet (8 mg total) by mouth 2 (two) times daily. Start the day after chemo for 3 days. Then take as needed for nausea or vomiting., Disp: 30 tablet, Rfl: 1;  prochlorperazine (COMPAZINE) 10 MG tablet, Take 1 tablet (10 mg total) by mouth every 6 (six) hours as needed (Nausea or vomiting)., Disp: 30 tablet, Rfl: 1 Pyridoxine HCl (VITAMIN B-6) 250 MG tablet, Take 1 tablet (250 mg total) by mouth daily., Disp: 30 tablet, Rfl: 6;  zolpidem (AMBIEN CR) 12.5 MG CR tablet, Take 12.5 mg by mouth at bedtime as needed for sleep. PT NEVER PICKED UP AT PHARMACY, Disp: , Rfl:   Allergies: No Known Allergies  Past Medical History, Surgical history, Social history, and Family History were reviewed and updated.  Review of Systems: As above  Physical Exam:  height is 5' 7"  (1.702 m) and weight is 232 lb (105.235 kg). His oral temperature is 97.7 F (36.5 C). His blood pressure is 124/93 and his pulse is 103. His respiration is 20.   Well-developed and well-nourished African American gentleman. His head and neck exam shows no ocular or oral lesions. He has no palpable cervical or supraclavicular lymph nodes. Lungs are clear bilaterally. Cardiac exam regular in rhythm with no murmurs rubs or bruits. Abdomen is soft. He has well-healed laparotomy scar. There is no fluid wave. There is no palpable liver  or spleen tip. Back exam no tenderness over the spine ribs or hips. Currently shows no clubbing cyanosis or edema. Skin exam no rashes, ecchymosis or petechia. Neurological exam is nonfocal.  Lab Results  Component Value Date   WBC 4.9 03/31/2014   HGB 14.7 03/31/2014   HCT 45.2 03/31/2014   MCV 80* 03/31/2014   PLT 280 03/31/2014     Chemistry      Component Value Date/Time   NA 142 03/31/2014 0852   NA 139 02/04/2014 0835   K 3.3 03/31/2014 0852   K 4.1 02/04/2014 0835   CL 102 03/31/2014 0852   CL 105 02/04/2014 0835   CO2 27 03/31/2014 0852   CO2 24 02/04/2014 0835    BUN 11 03/31/2014 0852   BUN 12 02/04/2014 0835   CREATININE 1.2 03/31/2014 0852   CREATININE 1.02 02/04/2014 0835      Component Value Date/Time   CALCIUM 9.2 03/31/2014 0852   CALCIUM 9.1 02/04/2014 0835   ALKPHOS 71 03/31/2014 0852   ALKPHOS 75 02/04/2014 0835   AST 20 03/31/2014 0852   AST 14 02/04/2014 0835   ALT 16 03/31/2014 0852   ALT 18 02/04/2014 0835   BILITOT 0.50 03/31/2014 0852   BILITOT 0.4 02/04/2014 0835     CEA is 13.7    Impression and Plan: [name] is 43 year old gentleman with metastatic colon cancer. So far, he has only had 3 cycles of chemotherapy. BX is done well with treatment but, again, he has had family issues that have really been a problem.  He still does not want to take any treatment. He feels well. He wants to go to still help his family out. He understands that he still has cancer. He has a strong faith.  We will go ahead and get him set up with some scans so that we can see how the cancer is progressing.  One would have to think that with his CEA coming down, the cancer just is not growing.  We will get the scans on an echo which. Depending on what the scans show, this will dictate whether we get him back in one week do any treatment.  We spent about 30 minutes or so with him today.   Volanda Napoleon, MD 8/12/20157:09 PM

## 2014-04-09 ENCOUNTER — Ambulatory Visit (HOSPITAL_COMMUNITY)
Admission: RE | Admit: 2014-04-09 | Discharge: 2014-04-09 | Disposition: A | Discharge status: Home / Self Care | Payer: Medicaid Other | Source: Ambulatory Visit | Attending: Hematology & Oncology | Admitting: Hematology & Oncology

## 2014-04-09 ENCOUNTER — Encounter (HOSPITAL_COMMUNITY): Payer: Self-pay

## 2014-04-09 DIAGNOSIS — C182 Malignant neoplasm of ascending colon: Secondary | ICD-10-CM | POA: Insufficient documentation

## 2014-04-09 HISTORY — DX: Acquired absence of other specified parts of digestive tract: Z90.49

## 2014-04-09 HISTORY — DX: Malignant (primary) neoplasm, unspecified: C80.1

## 2014-04-09 MED ORDER — IOHEXOL 300 MG/ML  SOLN
50.0000 mL | Freq: Once | INTRAMUSCULAR | Status: AC | PRN
  Administered 2014-04-09: 50 mL via ORAL

## 2014-04-09 MED ORDER — IOHEXOL 300 MG/ML  SOLN
100.0000 mL | Freq: Once | INTRAMUSCULAR | Status: AC | PRN
  Administered 2014-04-09: 100 mL via INTRAVENOUS

## 2014-05-20 ENCOUNTER — Other Ambulatory Visit: Payer: Self-pay | Admitting: Hematology & Oncology

## 2014-05-20 DIAGNOSIS — C182 Malignant neoplasm of ascending colon: Secondary | ICD-10-CM

## 2014-05-22 ENCOUNTER — Telehealth: Payer: Self-pay | Admitting: Hematology & Oncology

## 2014-05-22 NOTE — Telephone Encounter (Signed)
Left message with 10-14 appointment

## 2014-06-03 ENCOUNTER — Other Ambulatory Visit: Payer: Medicaid Other | Admitting: Lab

## 2014-06-03 ENCOUNTER — Other Ambulatory Visit (HOSPITAL_BASED_OUTPATIENT_CLINIC_OR_DEPARTMENT_OTHER): Payer: Medicaid Other | Admitting: Lab

## 2014-06-03 ENCOUNTER — Ambulatory Visit: Payer: Medicaid Other | Admitting: Hematology & Oncology

## 2014-06-03 ENCOUNTER — Ambulatory Visit (HOSPITAL_BASED_OUTPATIENT_CLINIC_OR_DEPARTMENT_OTHER): Payer: Medicaid Other | Admitting: Hematology & Oncology

## 2014-06-03 DIAGNOSIS — G629 Polyneuropathy, unspecified: Secondary | ICD-10-CM

## 2014-06-03 DIAGNOSIS — C182 Malignant neoplasm of ascending colon: Secondary | ICD-10-CM

## 2014-06-03 LAB — CBC WITH DIFFERENTIAL (CANCER CENTER ONLY)
BASO#: 0 10*3/uL (ref 0.0–0.2)
BASO%: 0.2 % (ref 0.0–2.0)
EOS ABS: 0.3 10*3/uL (ref 0.0–0.5)
EOS%: 6.4 % (ref 0.0–7.0)
HCT: 43.9 % (ref 38.7–49.9)
HGB: 14.3 g/dL (ref 13.0–17.1)
LYMPH#: 2.3 10*3/uL (ref 0.9–3.3)
LYMPH%: 42.9 % (ref 14.0–48.0)
MCH: 26.2 pg — ABNORMAL LOW (ref 28.0–33.4)
MCHC: 32.6 g/dL (ref 32.0–35.9)
MCV: 80 fL — ABNORMAL LOW (ref 82–98)
MONO#: 0.6 10*3/uL (ref 0.1–0.9)
MONO%: 10.7 % (ref 0.0–13.0)
NEUT#: 2.1 10*3/uL (ref 1.5–6.5)
NEUT%: 39.8 % — ABNORMAL LOW (ref 40.0–80.0)
Platelets: 271 10*3/uL (ref 145–400)
RBC: 5.46 10*6/uL (ref 4.20–5.70)
RDW: 14.7 % (ref 11.1–15.7)
WBC: 5.3 10*3/uL (ref 4.0–10.0)

## 2014-06-03 LAB — LACTATE DEHYDROGENASE: LDH: 135 U/L (ref 94–250)

## 2014-06-03 LAB — CMP (CANCER CENTER ONLY)
ALK PHOS: 72 U/L (ref 26–84)
ALT: 15 U/L (ref 10–47)
AST: 16 U/L (ref 11–38)
Albumin: 3.6 g/dL (ref 3.3–5.5)
BUN, Bld: 12 mg/dL (ref 7–22)
CALCIUM: 9.6 mg/dL (ref 8.0–10.3)
CHLORIDE: 107 meq/L (ref 98–108)
CO2: 26 mEq/L (ref 18–33)
CREATININE: 1 mg/dL (ref 0.6–1.2)
Glucose, Bld: 126 mg/dL — ABNORMAL HIGH (ref 73–118)
Potassium: 3.4 mEq/L (ref 3.3–4.7)
Sodium: 144 mEq/L (ref 128–145)
Total Bilirubin: 0.5 mg/dl (ref 0.20–1.60)
Total Protein: 7.4 g/dL (ref 6.4–8.1)

## 2014-06-03 LAB — CEA: CEA: 14.5 ng/mL — ABNORMAL HIGH (ref 0.0–5.0)

## 2014-06-03 NOTE — Progress Notes (Signed)
Hematology and Oncology Follow Up Visit  David Conrad [id number] 06/13/1971 43 y.o. 06/03/2014   Principle Diagnosis:  Metastatic colon cancer- KRAS wild type  Current Therapy:   Status post 3 cycles of FOLFIR/Avastin     Interim History:  David Conrad is back for followup.It has been very difficult to get him into the office. He feels well. He was had no specific complaints. There's no abdominal pain. He's had no cough. His outside has been good. There's been no leg swelling. He's had no fever. He's had no bleeding.  We did go ahead and do a CT scan done. This was actually back in August. However, the CT scan did not show any obvious progressive disease.  His last CEA level actually was better. It was 13.2.  He really does not like the chemotherapy. He does not like the pump to take home. He really has been a struggle to try to get treatment into him.   Overall, his performance status is ECOG 1. Medications: Current outpatient prescriptions:dexamethasone (DECADRON) 4 MG tablet, Take 2 tablets (8 mg total) by mouth 2 (two) times daily with a meal. Start the day after chemotherapy for 3 days. Take with food., Disp: 30 tablet, Rfl: 1;  ibuprofen (ADVIL,MOTRIN) 200 MG tablet, Take 200 mg by mouth every 6 (six) hours as needed., Disp: , Rfl: ;  lidocaine-prilocaine (EMLA) cream, Apply topically as needed., Disp: 30 g, Rfl: 3 ondansetron (ZOFRAN) 8 MG tablet, Take 1 tablet (8 mg total) by mouth 2 (two) times daily. Start the day after chemo for 3 days. Then take as needed for nausea or vomiting., Disp: 30 tablet, Rfl: 1;  zolpidem (AMBIEN CR) 12.5 MG CR tablet, Take 12.5 mg by mouth at bedtime as needed for sleep. PT NEVER PICKED UP AT PHARMACY, Disp: , Rfl:  loperamide (IMODIUM A-D) 2 MG tablet, Take 2 at onset of diarrhea, then 1 every 2hrs until 12hr without a BM. May take 2 tab every 4hrs at bedtime. If diarrhea recurs repeat., Disp: 100 tablet, Rfl: 1;  prochlorperazine (COMPAZINE) 10 MG  tablet, Take 1 tablet (10 mg total) by mouth every 6 (six) hours as needed (Nausea or vomiting)., Disp: 30 tablet, Rfl: 1 Pyridoxine HCl (VITAMIN B-6) 250 MG tablet, Take 1 tablet (250 mg total) by mouth daily., Disp: 30 tablet, Rfl: 6  Allergies: No Known Allergies  Past Medical History, Surgical history, Social history, and Family History were reviewed and updated.  Review of Systems: As above  Physical Exam:  vitals were not taken for this visit.  Well-developed and well-nourished African American gentleman. His head and neck exam shows no ocular or oral lesions. He has no palpable cervical or supraclavicular lymph nodes. Lungs are clear bilaterally. Cardiac exam regular in rhythm with no murmurs rubs or bruits. Abdomen is soft. He has well-healed laparotomy scar. There is no fluid wave. There is no palpable liver or spleen tip. Back exam no tenderness over the spine ribs or hips. Currently shows no clubbing cyanosis or edema. Skin exam no rashes, ecchymosis or petechia. Neurological exam is nonfocal.  Lab Results  Component Value Date   WBC 5.3 06/03/2014   HGB 14.3 06/03/2014   HCT 43.9 06/03/2014   MCV 80* 06/03/2014   PLT 271 06/03/2014     Chemistry      Component Value Date/Time   NA 144 06/03/2014 0832   NA 139 02/04/2014 0835   K 3.4 06/03/2014 0832   K 4.1 02/04/2014 0835  CL 107 06/03/2014 0832   CL 105 02/04/2014 0835   CO2 26 06/03/2014 0832   CO2 24 02/04/2014 0835   BUN 12 06/03/2014 0832   BUN 12 02/04/2014 0835   CREATININE 1.0 06/03/2014 0832   CREATININE 1.02 02/04/2014 0835      Component Value Date/Time   CALCIUM 9.6 06/03/2014 0832   CALCIUM 9.1 02/04/2014 0835   ALKPHOS 72 06/03/2014 0832   ALKPHOS 75 02/04/2014 0835   AST 16 06/03/2014 0832   AST 14 02/04/2014 0835   ALT 15 06/03/2014 0832   ALT 18 02/04/2014 0835   BILITOT 0.50 06/03/2014 0832   BILITOT 0.4 02/04/2014 0835     CEA is 13.7    Impression and Plan: David Conrad is 43 year old  gentleman with metastatic colon cancer. So far, he has only had 3 cycles of chemotherapy. ]   I talked to him about treatment. I explained to him that we really need to try to do something.  He FDA just came out with a new approved oral drug. This is a 5-FU derivative. The tradename is Lonsurf. This is actually a combination of 2 different agents.  I think this would be a very good option for him. He does not have to take this everyday. The protocol is for 5 days in a row for 2 weeks and then off for the next 2 weeks.  I think this would be very reasonable. His dose would be 80 mg a day.  Hopefully, he will be able to get this. The idiotic Epic system does not have this in its formulary yet. As such, I had to actually write this prescription out for him.  I want to try to get him back in one month.  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 Hopefully, this will help with his neuropathy.  I spent about 35 minutes with him today.   Volanda Napoleon, MD 10/14/20155:33 PM

## 2014-06-04 ENCOUNTER — Telehealth: Payer: Self-pay | Admitting: Hematology & Oncology

## 2014-06-04 ENCOUNTER — Telehealth: Payer: Self-pay | Admitting: Nurse Practitioner

## 2014-06-04 NOTE — Telephone Encounter (Addendum)
Message copied by Jimmy Footman on Thu Jun 04, 2014  5:53 PM ------      Message from: Burney Gauze R      Created: Wed Jun 03, 2014  7:02 PM       Please call and let him know that the labs look good. His CEA level is holding pretty stable at 14. Pete ------LVM on pt's machine.

## 2014-06-04 NOTE — Telephone Encounter (Signed)
Left message and mailed 11-11 appointment

## 2014-06-09 ENCOUNTER — Telehealth: Payer: Self-pay | Admitting: *Deleted

## 2014-06-09 NOTE — Telephone Encounter (Signed)
Received call from [facility] in [place] stating that lonsurf is now available for patient for $3 per month. They are in touch with patient for pick-up.

## 2014-07-01 ENCOUNTER — Other Ambulatory Visit (HOSPITAL_BASED_OUTPATIENT_CLINIC_OR_DEPARTMENT_OTHER): Payer: Medicaid Other | Admitting: Lab

## 2014-07-01 ENCOUNTER — Encounter: Payer: Self-pay | Admitting: Hematology & Oncology

## 2014-07-01 ENCOUNTER — Ambulatory Visit (HOSPITAL_BASED_OUTPATIENT_CLINIC_OR_DEPARTMENT_OTHER): Payer: Medicaid Other | Admitting: Hematology & Oncology

## 2014-07-01 VITALS — BP 131/86 | HR 83 | Temp 98.3°F | Resp 18 | Ht 67.0 in | Wt 233.0 lb

## 2014-07-01 DIAGNOSIS — C182 Malignant neoplasm of ascending colon: Secondary | ICD-10-CM

## 2014-07-01 DIAGNOSIS — G629 Polyneuropathy, unspecified: Secondary | ICD-10-CM

## 2014-07-01 LAB — CMP (CANCER CENTER ONLY)
ALBUMIN: 3.4 g/dL (ref 3.3–5.5)
ALT: 16 U/L (ref 10–47)
AST: 16 U/L (ref 11–38)
Alkaline Phosphatase: 74 U/L (ref 26–84)
BUN, Bld: 8 mg/dL (ref 7–22)
CALCIUM: 9.4 mg/dL (ref 8.0–10.3)
CO2: 28 mEq/L (ref 18–33)
Chloride: 99 mEq/L (ref 98–108)
Creat: 1 mg/dl (ref 0.6–1.2)
GLUCOSE: 87 mg/dL (ref 73–118)
POTASSIUM: 3.8 meq/L (ref 3.3–4.7)
Sodium: 140 mEq/L (ref 128–145)
TOTAL PROTEIN: 7.2 g/dL (ref 6.4–8.1)
Total Bilirubin: 0.5 mg/dl (ref 0.20–1.60)

## 2014-07-01 LAB — CBC WITH DIFFERENTIAL (CANCER CENTER ONLY)
BASO#: 0 10*3/uL (ref 0.0–0.2)
BASO%: 0.2 % (ref 0.0–2.0)
EOS%: 5.4 % (ref 0.0–7.0)
Eosinophils Absolute: 0.3 10*3/uL (ref 0.0–0.5)
HEMATOCRIT: 43.5 % (ref 38.7–49.9)
HGB: 14.1 g/dL (ref 13.0–17.1)
LYMPH#: 2 10*3/uL (ref 0.9–3.3)
LYMPH%: 35.6 % (ref 14.0–48.0)
MCH: 25.9 pg — AB (ref 28.0–33.4)
MCHC: 32.4 g/dL (ref 32.0–35.9)
MCV: 80 fL — ABNORMAL LOW (ref 82–98)
MONO#: 0.7 10*3/uL (ref 0.1–0.9)
MONO%: 12.6 % (ref 0.0–13.0)
NEUT#: 2.6 10*3/uL (ref 1.5–6.5)
NEUT%: 46.2 % (ref 40.0–80.0)
Platelets: 280 10*3/uL (ref 145–400)
RBC: 5.45 10*6/uL (ref 4.20–5.70)
RDW: 14.9 % (ref 11.1–15.7)
WBC: 5.6 10*3/uL (ref 4.0–10.0)

## 2014-07-01 NOTE — Progress Notes (Signed)
Hematology and Oncology Follow Up Visit  David Conrad [id number] November 01, 1970 43 y.o. 07/01/2014   Principle Diagnosis:  Metastatic colon cancer- KRAS wild type  Current Therapy:   Lonsurf  47m po q Day (2weeks on/2 weeks off)     Interim History:  Mr.  David Conrad back for followup.I now have LAdmire which is the new FDA approved oral agent for metastatic colon cancer. He is doing well with this so far. He really has had no toxicity. He's had no nausea vomiting. He's had no rashes. He's had no diarrhea. He's had no pain.  As usual, his issues are with his family. He just has a lot of family issues. This mostly involves his kids. They apparently this or not behaving as he would like them to. His oldest boy, who has a handicap, has been suspended from school multiple occasions. He is been trying to hit teachers. He's been going to court. Now his younger son is*starting to act out.  His appetite is doing okay. He's had no nausea or vomiting. He's had no cough. He's had no fever. He's had no bleeding. He's had no leg swelling.  His last CEA was stable at 13.7.  Overall, his performance status is ECOG 1. Medications: Current outpatient prescriptions: dexamethasone (DECADRON) 4 MG tablet, Take 2 tablets (8 mg total) by mouth 2 (two) times daily with a meal. Start the day after chemotherapy for 3 days. Take with food., Disp: 30 tablet, Rfl: 1;  ibuprofen (ADVIL,MOTRIN) 200 MG tablet, Take 200 mg by mouth every 6 (six) hours as needed., Disp: , Rfl: ;  lidocaine-prilocaine (EMLA) cream, Apply topically as needed., Disp: 30 g, Rfl: 3 loperamide (IMODIUM A-D) 2 MG tablet, Take 2 at onset of diarrhea, then 1 every 2hrs until 12hr without a BM. May take 2 tab every 4hrs at bedtime. If diarrhea recurs repeat., Disp: 100 tablet, Rfl: 1;  ondansetron (ZOFRAN) 8 MG tablet, Take 1 tablet (8 mg total) by mouth 2 (two) times daily. Start the day after chemo for 3 days. Then take as needed for nausea or  vomiting., Disp: 30 tablet, Rfl: 1 zolpidem (AMBIEN CR) 12.5 MG CR tablet, Take 12.5 mg by mouth at bedtime as needed for sleep. PT NEVER PICKED UP AT PHARMACY, Disp: , Rfl: ;  prochlorperazine (COMPAZINE) 10 MG tablet, Take 1 tablet (10 mg total) by mouth every 6 (six) hours as needed (Nausea or vomiting)., Disp: 30 tablet, Rfl: 1;  Pyridoxine HCl (VITAMIN B-6) 250 MG tablet, Take 1 tablet (250 mg total) by mouth daily., Disp: 30 tablet, Rfl: 6  Allergies: No Known Allergies  Past Medical History, Surgical history, Social history, and Family History were reviewed and updated.  Review of Systems: As above  Physical Exam:  height is 5' 7"  (1.702 m) and weight is 233 lb (105.688 kg). His oral temperature is 98.3 F (36.8 C). His blood pressure is 131/86 and his pulse is 83. His respiration is 18.   Well-developed and well-nourished African American gentleman. His head and neck exam shows no ocular or oral lesions. He has no palpable cervical or supraclavicular lymph nodes. Lungs are clear bilaterally. Cardiac exam regular rate and rhythm with no murmurs rubs or bruits. Abdomen is soft. He has a well-healed laparotomy scar. There is no fluid wave. There is no palpable liver or spleen tip.  He has no palpable abdominal mass.Back exam shows no tenderness over the spine ribs or hips. extremities shows no clubbing cyanosis or edema. Skin  exam no rashes, ecchymosis or petechia. Neurological exam is nonfocal.  Lab Results  Component Value Date   WBC 5.6 07/01/2014   HGB 14.1 07/01/2014   HCT 43.5 07/01/2014   MCV 80* 07/01/2014   PLT 280 07/01/2014     Chemistry      Component Value Date/Time   NA 140 07/01/2014 1257   NA 139 02/04/2014 0835   K 3.8 07/01/2014 1257   K 4.1 02/04/2014 0835   CL 99 07/01/2014 1257   CL 105 02/04/2014 0835   CO2 28 07/01/2014 1257   CO2 24 02/04/2014 0835   BUN 8 07/01/2014 1257   BUN 12 02/04/2014 0835   CREATININE 1.0 07/01/2014 1257   CREATININE 1.02  02/04/2014 0835      Component Value Date/Time   CALCIUM 9.4 07/01/2014 1257   CALCIUM 9.1 02/04/2014 0835   ALKPHOS 74 07/01/2014 1257   ALKPHOS 75 02/04/2014 0835   AST 16 07/01/2014 1257   AST 14 02/04/2014 0835   ALT 16 07/01/2014 1257   ALT 18 02/04/2014 0835   BILITOT 0.50 07/01/2014 1257   BILITOT 0.4 02/04/2014 0835     CEA is 13.7    Impression and Plan: [name] is 43 year old gentleman with metastatic colon cancer.   He now is on oral therapy. Again, he is on Lonsurf. I think he is taking this. He does not mind taking this.  We probably need to get him set up with a scan sometime in December.  I want to see him back in one month to see how he is doing.  He really stresses quite a bit about his family situation. With the upcoming holidays, he is not sure how he will be oh to get any presents for his children. He has a strong faith. I am sure that God will take care of things for him.   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 Hopefully,switching therapy will help with his neuropathy.  I spent about 35 minutes with him today.   Volanda Napoleon, MD 11/11/20156:36 PM

## 2014-07-02 LAB — CEA: CEA: 17.4 ng/mL — ABNORMAL HIGH (ref 0.0–5.0)

## 2014-07-29 ENCOUNTER — Telehealth: Payer: Self-pay | Admitting: Hematology & Oncology

## 2014-07-29 ENCOUNTER — Ambulatory Visit (HOSPITAL_BASED_OUTPATIENT_CLINIC_OR_DEPARTMENT_OTHER): Payer: Medicare Other | Admitting: Hematology & Oncology

## 2014-07-29 ENCOUNTER — Other Ambulatory Visit (HOSPITAL_BASED_OUTPATIENT_CLINIC_OR_DEPARTMENT_OTHER): Payer: Medicare Other | Admitting: Lab

## 2014-07-29 ENCOUNTER — Encounter: Payer: Self-pay | Admitting: Hematology & Oncology

## 2014-07-29 VITALS — BP 109/74 | HR 95 | Temp 98.2°F | Resp 20 | Ht 67.0 in | Wt 232.0 lb

## 2014-07-29 DIAGNOSIS — C182 Malignant neoplasm of ascending colon: Secondary | ICD-10-CM

## 2014-07-29 DIAGNOSIS — G629 Polyneuropathy, unspecified: Secondary | ICD-10-CM

## 2014-07-29 DIAGNOSIS — R970 Elevated carcinoembryonic antigen [CEA]: Secondary | ICD-10-CM

## 2014-07-29 LAB — CBC WITH DIFFERENTIAL (CANCER CENTER ONLY)
BASO#: 0 10*3/uL (ref 0.0–0.2)
BASO%: 0.2 % (ref 0.0–2.0)
EOS%: 3.2 % (ref 0.0–7.0)
Eosinophils Absolute: 0.2 10*3/uL (ref 0.0–0.5)
HCT: 42 % (ref 38.7–49.9)
HGB: 13.6 g/dL (ref 13.0–17.1)
LYMPH#: 1.7 10*3/uL (ref 0.9–3.3)
LYMPH%: 36.1 % (ref 14.0–48.0)
MCH: 26 pg — ABNORMAL LOW (ref 28.0–33.4)
MCHC: 32.4 g/dL (ref 32.0–35.9)
MCV: 80 fL — ABNORMAL LOW (ref 82–98)
MONO#: 0.6 10*3/uL (ref 0.1–0.9)
MONO%: 12.8 % (ref 0.0–13.0)
NEUT%: 47.7 % (ref 40.0–80.0)
NEUTROS ABS: 2.2 10*3/uL (ref 1.5–6.5)
Platelets: 287 10*3/uL (ref 145–400)
RBC: 5.23 10*6/uL (ref 4.20–5.70)
RDW: 16.2 % — AB (ref 11.1–15.7)
WBC: 4.7 10*3/uL (ref 4.0–10.0)

## 2014-07-29 LAB — CMP (CANCER CENTER ONLY)
ALBUMIN: 3.3 g/dL (ref 3.3–5.5)
ALT(SGPT): 14 U/L (ref 10–47)
AST: 17 U/L (ref 11–38)
Alkaline Phosphatase: 82 U/L (ref 26–84)
BUN: 10 mg/dL (ref 7–22)
CO2: 27 mEq/L (ref 18–33)
Calcium: 9 mg/dL (ref 8.0–10.3)
Chloride: 106 mEq/L (ref 98–108)
Creat: 1 mg/dl (ref 0.6–1.2)
Glucose, Bld: 110 mg/dL (ref 73–118)
Potassium: 3.7 mEq/L (ref 3.3–4.7)
Sodium: 144 mEq/L (ref 128–145)
Total Bilirubin: 0.6 mg/dl (ref 0.20–1.60)
Total Protein: 7.4 g/dL (ref 6.4–8.1)

## 2014-07-29 NOTE — Progress Notes (Signed)
Hematology and Oncology Follow Up Visit  David Conrad [id number] 11-30-1970 43 y.o. 07/29/2014   Principle Diagnosis:  Metastatic colon cancer- KRAS wild type  Current Therapy:   Lonsurf  38m po q Day (2weeks on/2 weeks off)     Interim History:  David Conrad back for followup.I now have David Conrad which is the new FDA approved oral agent for metastatic colon cancer. He is doing well with this so far. He really has had no toxicity. He's had no nausea vomiting. He's had no rashes. He's had no diarrhea. He's had no pain.  As usual, his issues are with his family. He just has a lot of family issues. This mostly involves his kids. They apparently are behaving as he would like them to. His oldest boy, who has a handicap, has been suspended from school multiple occasions. He is been trying to hit teachers. He's been going to court. Now his younger son is*starting to act out.  His appetite is doing okay. He's had no nausea or vomiting. He's had no cough. He's had no fever. He's had no bleeding. He's had no leg swelling.  His last CEA was stable at 17.4   Overall, his performance status is ECOG 1. Medications: Current outpatient prescriptions: ibuprofen (ADVIL,MOTRIN) 200 MG tablet, Take 200 mg by mouth every 6 (six) hours as needed., Disp: , Rfl: ;  lidocaine-prilocaine (EMLA) cream, Apply topically as needed. (Patient taking differently: Apply topically as needed. Use for flush only), Disp: 30 g, Rfl: 3 loperamide (IMODIUM A-D) 2 MG tablet, Take 2 at onset of diarrhea, then 1 every 2hrs until 12hr without a BM. May take 2 tab every 4hrs at bedtime. If diarrhea recurs repeat., Disp: 100 tablet, Rfl: 1;  ondansetron (ZOFRAN) 8 MG tablet, Take by mouth every 8 (eight) hours as needed for nausea or vomiting., Disp: , Rfl: ;  Trifluridine-Tipiracil (LONSURF PO), Take 80 mg by mouth every morning., Disp: , Rfl:  Pyridoxine HCl (VITAMIN B-6) 250 MG tablet, Take 1 tablet (250 mg total) by mouth  daily. (Patient not taking: Reported on 07/29/2014), Disp: 30 tablet, Rfl: 6;  zolpidem (AMBIEN CR) 12.5 MG CR tablet, Take 12.5 mg by mouth at bedtime as needed for sleep. PT NEVER PICKED UP AT PHARMACY, Disp: , Rfl:   Allergies: No Known Allergies  Past Medical History, Surgical history, Social history, and Family History were reviewed and updated.  Review of Systems: As above  Physical Exam:  height is 5' 7"  (1.702 m) and weight is 232 lb (105.235 kg). His oral temperature is 98.2 F (36.8 C). His blood pressure is 109/74 and his pulse is 95. His respiration is 20.   Well-developed and well-nourished African American gentleman. His head and neck exam shows no ocular or oral lesions. He has no palpable cervical or supraclavicular lymph nodes. Lungs are clear bilaterally. Cardiac exam regular rate and rhythm with no murmurs rubs or bruits. Abdomen is soft. He has a well-healed laparotomy scar. There is no fluid wave. There is no palpable liver or spleen tip.  He has no palpable abdominal mass.Back exam shows no tenderness over the spine ribs or hips. extremities shows no clubbing cyanosis or edema. Skin exam no rashes, ecchymosis or petechia. Neurological exam is nonfocal.  Lab Results  Component Value Date   WBC 4.7 07/29/2014   HGB 13.6 07/29/2014   HCT 42.0 07/29/2014   MCV 80* 07/29/2014   PLT 287 07/29/2014     Chemistry  Component Value Date/Time   NA 140 07/01/2014 1257   NA 139 02/04/2014 0835   K 3.8 07/01/2014 1257   K 4.1 02/04/2014 0835   CL 99 07/01/2014 1257   CL 105 02/04/2014 0835   CO2 28 07/01/2014 1257   CO2 24 02/04/2014 0835   BUN 8 07/01/2014 1257   BUN 12 02/04/2014 0835   CREATININE 1.0 07/01/2014 1257   CREATININE 1.02 02/04/2014 0835      Component Value Date/Time   CALCIUM 9.4 07/01/2014 1257   CALCIUM 9.1 02/04/2014 0835   ALKPHOS 74 07/01/2014 1257   ALKPHOS 75 02/04/2014 0835   AST 16 07/01/2014 1257   AST 14 02/04/2014 0835   ALT 16  07/01/2014 1257   ALT 18 02/04/2014 0835   BILITOT 0.50 07/01/2014 1257   BILITOT 0.4 02/04/2014 0835         Impression and Plan: David Conrad is 43 year old gentleman with metastatic colon cancer.   He now is on oral therapy. Again, he is on Lonsurf. He had not been taking this for the past couple weeks because of financial issues. He now has another refill. He will pick this up downstairs.   We probably need to get him set up with a scan sometime in early January. His CEA going up is a little bit troublesome.  I want to see him back a week after the CT scan to see how he is doing.  He really stresses quite a bit about his family situation. With the upcoming holidays, he is not sure how he will be oh to get any presents for his children. He has a strong faith. I am sure that God will take care of things for him.   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 Hopefully,switching therapy will help with his neuropathy.  I spent about 35 minutes with him today.   Volanda Napoleon, MD 12/9/20151:26 PM

## 2014-07-29 NOTE — Telephone Encounter (Signed)
Pt aware of 1-6 CT to be npo 4 hrs and drink contrast. He is also aware of 1-13 MD

## 2014-07-30 LAB — CEA: CEA: 11.5 ng/mL — AB (ref 0.0–5.0)

## 2014-07-30 LAB — LACTATE DEHYDROGENASE: LDH: 116 U/L (ref 94–250)

## 2014-08-26 ENCOUNTER — Ambulatory Visit (HOSPITAL_COMMUNITY)
Admission: RE | Admit: 2014-08-26 | Discharge: 2014-08-26 | Disposition: A | Discharge status: Home / Self Care | Payer: Medicare Other | Source: Ambulatory Visit | Attending: Hematology & Oncology | Admitting: Hematology & Oncology

## 2014-08-26 ENCOUNTER — Encounter (HOSPITAL_COMMUNITY): Payer: Self-pay

## 2014-08-26 DIAGNOSIS — Z79899 Other long term (current) drug therapy: Secondary | ICD-10-CM | POA: Insufficient documentation

## 2014-08-26 DIAGNOSIS — R197 Diarrhea, unspecified: Secondary | ICD-10-CM | POA: Insufficient documentation

## 2014-08-26 DIAGNOSIS — C182 Malignant neoplasm of ascending colon: Secondary | ICD-10-CM | POA: Insufficient documentation

## 2014-08-26 MED ORDER — IOHEXOL 300 MG/ML  SOLN
50.0000 mL | Freq: Once | INTRAMUSCULAR | Status: AC | PRN
  Administered 2014-08-26: 50 mL via ORAL

## 2014-08-26 MED ORDER — IOHEXOL 300 MG/ML  SOLN
100.0000 mL | Freq: Once | INTRAMUSCULAR | Status: AC | PRN
  Administered 2014-08-26: 100 mL via INTRAVENOUS

## 2014-08-31 ENCOUNTER — Telehealth: Payer: Self-pay | Admitting: Hematology & Oncology

## 2014-08-31 NOTE — Telephone Encounter (Signed)
Pt called moved appointment to morning due to transportation issues

## 2014-09-01 ENCOUNTER — Telehealth: Payer: Self-pay | Admitting: *Deleted

## 2014-09-01 NOTE — Telephone Encounter (Addendum)
Patient has an appointment tomorrow, Wednesday January 13th for Dr Marin Olp to discuss in person.   ----- Message from Volanda Napoleon, MD sent at 08/31/2014  6:01 PM EST ----- I left a message on his answering machine that the cancer was more active. I told him that we are going to have to consider chemotherapy again. This is IV chemotherapy. He currently is on Lonsurf which does not appear to be doing the job. We have to get him back on to chemotherapy. I don't see any other option at this point. David Conrad

## 2014-09-02 ENCOUNTER — Ambulatory Visit (HOSPITAL_BASED_OUTPATIENT_CLINIC_OR_DEPARTMENT_OTHER): Payer: Medicare Other | Admitting: Hematology & Oncology

## 2014-09-02 ENCOUNTER — Ambulatory Visit: Payer: Medicaid Other | Admitting: Hematology & Oncology

## 2014-09-02 ENCOUNTER — Other Ambulatory Visit (HOSPITAL_BASED_OUTPATIENT_CLINIC_OR_DEPARTMENT_OTHER): Payer: Medicare Other | Admitting: Lab

## 2014-09-02 ENCOUNTER — Ambulatory Visit (HOSPITAL_BASED_OUTPATIENT_CLINIC_OR_DEPARTMENT_OTHER): Payer: Medicare Other

## 2014-09-02 ENCOUNTER — Encounter: Payer: Self-pay | Admitting: Hematology & Oncology

## 2014-09-02 ENCOUNTER — Other Ambulatory Visit: Payer: Medicaid Other | Admitting: Lab

## 2014-09-02 VITALS — BP 134/86 | HR 97 | Temp 98.0°F | Resp 18 | Ht 67.0 in | Wt 231.0 lb

## 2014-09-02 DIAGNOSIS — C786 Secondary malignant neoplasm of retroperitoneum and peritoneum: Secondary | ICD-10-CM

## 2014-09-02 DIAGNOSIS — C182 Malignant neoplasm of ascending colon: Secondary | ICD-10-CM

## 2014-09-02 DIAGNOSIS — C787 Secondary malignant neoplasm of liver and intrahepatic bile duct: Secondary | ICD-10-CM

## 2014-09-02 LAB — CBC WITH DIFFERENTIAL (CANCER CENTER ONLY)
BASO#: 0 10*3/uL (ref 0.0–0.2)
BASO%: 0.2 % (ref 0.0–2.0)
EOS ABS: 0.3 10*3/uL (ref 0.0–0.5)
EOS%: 4.4 % (ref 0.0–7.0)
HCT: 43.3 % (ref 38.7–49.9)
HEMOGLOBIN: 13.7 g/dL (ref 13.0–17.1)
LYMPH#: 2.1 10*3/uL (ref 0.9–3.3)
LYMPH%: 36.1 % (ref 14.0–48.0)
MCH: 25.2 pg — AB (ref 28.0–33.4)
MCHC: 31.6 g/dL — ABNORMAL LOW (ref 32.0–35.9)
MCV: 80 fL — ABNORMAL LOW (ref 82–98)
MONO#: 0.8 10*3/uL (ref 0.1–0.9)
MONO%: 13.2 % — ABNORMAL HIGH (ref 0.0–13.0)
NEUT%: 46.1 % (ref 40.0–80.0)
NEUTROS ABS: 2.7 10*3/uL (ref 1.5–6.5)
PLATELETS: 323 10*3/uL (ref 145–400)
RBC: 5.43 10*6/uL (ref 4.20–5.70)
RDW: 16.8 % — ABNORMAL HIGH (ref 11.1–15.7)
WBC: 5.9 10*3/uL (ref 4.0–10.0)

## 2014-09-02 LAB — CMP (CANCER CENTER ONLY)
ALBUMIN: 3.3 g/dL (ref 3.3–5.5)
ALK PHOS: 80 U/L (ref 26–84)
ALT: 10 U/L (ref 10–47)
AST: 18 U/L (ref 11–38)
BUN, Bld: 15 mg/dL (ref 7–22)
CO2: 28 meq/L (ref 18–33)
Calcium: 9.5 mg/dL (ref 8.0–10.3)
Chloride: 101 mEq/L (ref 98–108)
Creat: 1.2 mg/dl (ref 0.6–1.2)
GLUCOSE: 113 mg/dL (ref 73–118)
POTASSIUM: 3.7 meq/L (ref 3.3–4.7)
SODIUM: 141 meq/L (ref 128–145)
TOTAL PROTEIN: 7.7 g/dL (ref 6.4–8.1)
Total Bilirubin: 0.5 mg/dl (ref 0.20–1.60)

## 2014-09-02 LAB — CEA: CEA: 22 ng/mL — AB (ref 0.0–5.0)

## 2014-09-02 MED ORDER — HEPARIN SOD (PORK) LOCK FLUSH 100 UNIT/ML IV SOLN
500.0000 [IU] | Freq: Once | INTRAVENOUS | Status: AC
  Administered 2014-09-02: 500 [IU] via INTRAVENOUS
  Filled 2014-09-02: qty 5

## 2014-09-02 MED ORDER — SODIUM CHLORIDE 0.9 % IJ SOLN
10.0000 mL | Freq: Once | INTRAMUSCULAR | Status: AC
  Administered 2014-09-02: 10 mL via INTRAVENOUS
  Filled 2014-09-02: qty 10

## 2014-09-02 NOTE — Patient Instructions (Signed)

## 2014-09-02 NOTE — Progress Notes (Signed)
Hematology and Oncology Follow Up Visit  David Conrad [id number] 1971-08-04 44 y.o. 09/02/2014   Principle Diagnosis:  Metastatic colon cancer- KRAS wild type  Current Therapy:   Lonsurf  56m po q Day (2weeks on/2 weeks off)     Interim History:  Mr.  Conrad back for followup.he had a nice Christmas. His birthday was the day before Christmas. He enjoyed this.  He has not been taking the Lonsurf as he should. I talked about length about this.  His last CT scan showed a clear progression of disease. Again, the fact [facility] certainly could be a factor. Pap has progressive hepatic metastases. He has progressive/new omental and peritoneal disease.  It'll be interesting to see what his CEA is. His last CEA was 11.5.  He has had no David Conrad pain. He's had no change in bowel or bladder habits. He's been eating well. He's had no nausea or vomiting. He's had no cough. He's had no leg swelling. He's had no rashes.  Overall, his performance status is ECOG 1.  Medications:  Current outpatient prescriptions:  .  ibuprofen (ADVIL,MOTRIN) 200 MG tablet, Take 200 mg by mouth every 6 (six) hours as needed., Disp: , Rfl:  .  lidocaine-prilocaine (EMLA) cream, Apply topically as needed. (Patient taking differently: Apply topically as needed. Use for flush only), Disp: 30 g, Rfl: 3 .  Trifluridine-Tipiracil (LONSURF PO), Take 80 mg by mouth every morning., Disp: , Rfl:  .  loperamide (IMODIUM A-D) 2 MG tablet, Take 2 at onset of diarrhea, then 1 every 2hrs until 12hr without a BM. May take 2 tab every 4hrs at bedtime. If diarrhea recurs repeat. (Patient not taking: Reported on 09/02/2014), Disp: 100 tablet, Rfl: 1 .  ondansetron (ZOFRAN) 8 MG tablet, Take by mouth every 8 (eight) hours as needed for nausea or vomiting., Disp: , Rfl:  .  Pyridoxine HCl (VITAMIN B-6) 250 MG tablet, Take 1 tablet (250 mg total) by mouth daily. (Patient not taking: Reported on  07/29/2014), Disp: 30 tablet, Rfl: 6 .  zolpidem (AMBIEN CR) 12.5 MG CR tablet, Take 12.5 mg by mouth at bedtime as needed for sleep. PT NEVER PICKED UP AT PHARMACY, Disp: , Rfl:  No current facility-administered medications for this visit.  Facility-Administered Medications Ordered in Other Visits:  .  heparin lock flush 100 unit/mL, 500 Units, Intravenous, Once, PVolanda Napoleon MD .  sodium chloride 0.9 % injection 10 mL, 10 mL, Intravenous, Once, PVolanda Napoleon MD  Allergies: No Known Allergies  Past Medical History, Surgical history, Social history, and Family History were reviewed and updated.  Review of Systems: As above  Physical Exam:  height is 5' 7"  (1.702 m) and weight is 231 lb (104.781 kg). His oral temperature is 98 F (36.7 C). His blood pressure is 134/86 and his pulse is 97. His respiration is 18.   Well-developed and well-nourished African American gentleman. His head and neck exam shows no ocular or oral lesions. He has no palpable cervical or supraclavicular lymph nodes. Lungs are clear bilaterally. Cardiac exam regular rate and rhythm with no murmurs rubs or bruits. Abdomen is soft. He has a well-healed laparotomy scar. There is no fluid wave. There is no palpable liver or spleen tip.  He has no palpable abdominal mass.Back exam shows no tenderness over the spine ribs or hips. extremities shows no clubbing cyanosis or edema. Skin exam no rashes, ecchymosis or petechia. Neurological exam  is nonfocal.  Lab Results  Component Value Date   WBC 5.9 09/02/2014   HGB 13.7 09/02/2014   HCT 43.3 09/02/2014   MCV 80* 09/02/2014   PLT 323 09/02/2014     Chemistry      Component Value Date/Time   NA 141 09/02/2014 0854   NA 139 02/04/2014 0835   K 3.7 09/02/2014 0854   K 4.1 02/04/2014 0835   CL 101 09/02/2014 0854   CL 105 02/04/2014 0835   CO2 28 09/02/2014 0854   CO2 24 02/04/2014 0835   BUN 15 09/02/2014 0854   BUN 12 02/04/2014 0835   CREATININE 1.2  09/02/2014 0854   CREATININE 1.02 02/04/2014 0835      Component Value Date/Time   CALCIUM 9.5 09/02/2014 0854   CALCIUM 9.1 02/04/2014 0835   ALKPHOS 80 09/02/2014 0854   ALKPHOS 75 02/04/2014 0835   AST 18 09/02/2014 0854   AST 14 02/04/2014 0835   ALT 10 09/02/2014 0854   ALT 18 02/04/2014 0835   BILITOT 0.50 09/02/2014 0854   BILITOT 0.4 02/04/2014 0835         Impression and Plan: David Conrad is 44 year old gentleman with metastatic colon cancer.   He does not 1 to go back onto IV chemotherapy. He says that he just was not taking the Lonsurf like he should have. He said he "colds".  He promises to take the David Conrad as scheduled.  He looks great. He feels good. I thing we can continue to just watch him.  I probably would get another scan on him in 2 months. This, I think, will definitely let us know if things are worsening.  I spent about 35 minutes with him today.   David Napoleon, MD 1/13/20169:59 AM

## 2014-09-22 ENCOUNTER — Telehealth: Payer: Self-pay | Admitting: Nurse Practitioner

## 2014-09-22 ENCOUNTER — Encounter: Payer: Self-pay | Admitting: Nurse Practitioner

## 2014-09-22 NOTE — Telephone Encounter (Signed)
Telephone review completed and Pt approved for Lonsurf 09/21/14-09/23/15. YC#[id number]

## 2014-09-23 ENCOUNTER — Other Ambulatory Visit (HOSPITAL_BASED_OUTPATIENT_CLINIC_OR_DEPARTMENT_OTHER): Payer: Medicare Other | Admitting: Lab

## 2014-09-23 ENCOUNTER — Encounter: Payer: Self-pay | Admitting: Hematology & Oncology

## 2014-09-23 ENCOUNTER — Ambulatory Visit (HOSPITAL_BASED_OUTPATIENT_CLINIC_OR_DEPARTMENT_OTHER): Payer: Medicare Other | Admitting: Hematology & Oncology

## 2014-09-23 ENCOUNTER — Other Ambulatory Visit: Payer: Self-pay | Admitting: *Deleted

## 2014-09-23 ENCOUNTER — Telehealth: Payer: Self-pay | Admitting: Hematology & Oncology

## 2014-09-23 VITALS — BP 115/73 | HR 100 | Temp 98.7°F | Resp 20 | Ht 67.0 in | Wt 228.0 lb

## 2014-09-23 DIAGNOSIS — C182 Malignant neoplasm of ascending colon: Secondary | ICD-10-CM

## 2014-09-23 DIAGNOSIS — J00 Acute nasopharyngitis [common cold]: Secondary | ICD-10-CM | POA: Diagnosis not present

## 2014-09-23 DIAGNOSIS — G629 Polyneuropathy, unspecified: Secondary | ICD-10-CM

## 2014-09-23 DIAGNOSIS — J209 Acute bronchitis, unspecified: Secondary | ICD-10-CM

## 2014-09-23 DIAGNOSIS — G5793 Unspecified mononeuropathy of bilateral lower limbs: Secondary | ICD-10-CM

## 2014-09-23 LAB — CMP (CANCER CENTER ONLY)
ALBUMIN: 3.4 g/dL (ref 3.3–5.5)
ALT: 12 U/L (ref 10–47)
AST: 13 U/L (ref 11–38)
Alkaline Phosphatase: 76 U/L (ref 26–84)
BUN, Bld: 10 mg/dL (ref 7–22)
CHLORIDE: 105 meq/L (ref 98–108)
CO2: 27 meq/L (ref 18–33)
CREATININE: 1.2 mg/dL (ref 0.6–1.2)
Calcium: 9.4 mg/dL (ref 8.0–10.3)
GLUCOSE: 111 mg/dL (ref 73–118)
Potassium: 3.8 mEq/L (ref 3.3–4.7)
Sodium: 140 mEq/L (ref 128–145)
Total Bilirubin: 0.6 mg/dl (ref 0.20–1.60)
Total Protein: 7.4 g/dL (ref 6.4–8.1)

## 2014-09-23 LAB — CBC WITH DIFFERENTIAL (CANCER CENTER ONLY)
BASO#: 0 10*3/uL (ref 0.0–0.2)
BASO%: 0.4 % (ref 0.0–2.0)
EOS%: 6.1 % (ref 0.0–7.0)
Eosinophils Absolute: 0.3 10*3/uL (ref 0.0–0.5)
HCT: 40.9 % (ref 38.7–49.9)
HGB: 13.1 g/dL (ref 13.0–17.1)
LYMPH#: 1.7 10*3/uL (ref 0.9–3.3)
LYMPH%: 33.5 % (ref 14.0–48.0)
MCH: 25.3 pg — ABNORMAL LOW (ref 28.0–33.4)
MCHC: 32 g/dL (ref 32.0–35.9)
MCV: 79 fL — AB (ref 82–98)
MONO#: 0.8 10*3/uL (ref 0.1–0.9)
MONO%: 16.9 % — ABNORMAL HIGH (ref 0.0–13.0)
NEUT%: 43.1 % (ref 40.0–80.0)
NEUTROS ABS: 2.1 10*3/uL (ref 1.5–6.5)
Platelets: 343 10*3/uL (ref 145–400)
RBC: 5.17 10*6/uL (ref 4.20–5.70)
RDW: 17.3 % — ABNORMAL HIGH (ref 11.1–15.7)
WBC: 4.9 10*3/uL (ref 4.0–10.0)

## 2014-09-23 LAB — LACTATE DEHYDROGENASE: LDH: 132 U/L (ref 94–250)

## 2014-09-23 LAB — CEA: CEA: 21.3 ng/mL — ABNORMAL HIGH (ref 0.0–5.0)

## 2014-09-23 MED ORDER — VITAMIN B-6 250 MG PO TABS: 250.0000 mg | ORAL_TABLET | Freq: Every day | ORAL | Status: DC

## 2014-09-23 MED ORDER — VITAMIN B-6 250 MG PO TABS: 250.0000 mg | ORAL_TABLET | Freq: Every day | ORAL | Status: AC

## 2014-09-23 MED ORDER — AZITHROMYCIN 250 MG PO TABS: ORAL_TABLET | ORAL | Status: DC

## 2014-09-23 NOTE — Telephone Encounter (Signed)
Called pt to give 3-2 schedule he wants to drink water based contrast for CT and per [facility]oyle Sauer they don't offer that here anymore.Pt is aware of 3-2 CT at [facility] to be 2 hrs early and npo 4 hrs.Pt is aware 3-7 MD appointment

## 2014-09-23 NOTE — Progress Notes (Signed)
Hematology and Oncology Follow Up Visit  David Conrad [id number] 21-Jan-1971 44 y.o. 09/23/2014   Principle Diagnosis:  Metastatic colon cancer- KRAS wild type  Current Therapy:   Lonsurf  68m po q Day (2weeks on/2 weeks off)     Interim History:  Mr.  Conrad back for followup.he has a lipid of a cold. He's had some congestion. I will give him some Zithromax  He does have some tingling in the hands and feet. He stopped taking his vitamin B6. I told him that he has to get back on this.   His appetite is down a little. He's lost a little weight.  He's had no nausea or vomiting. He's been no change in bowel or bladder habits. He's had no obvious fever. He's had no bleeding.  His been no leg swelling. He's had no rashes.  He's had no headache.  Overall, his performance status is ECOG 1.  Medications:  Current outpatient prescriptions:  .  DM-Doxylamine-Acetaminophen (NYQUIL COLD & FLU PO), Take by mouth 2 (two) times daily., Disp: , Rfl:  .  ibuprofen (ADVIL,MOTRIN) 200 MG tablet, Take 200 mg by mouth every 6 (six) hours as needed., Disp: , Rfl:  .  loperamide (IMODIUM A-D) 2 MG tablet, Take 2 at onset of diarrhea, then 1 every 2hrs until 12hr without a BM. May take 2 tab every 4hrs at bedtime. If diarrhea recurs repeat., Disp: 100 tablet, Rfl: 1 .  Trifluridine-Tipiracil (LONSURF PO), Take 80 mg by mouth every morning., Disp: , Rfl:  .  azithromycin (ZITHROMAX Z-PAK) 250 MG tablet, Take as directed, Disp: 6 each, Rfl: 0 .  lidocaine-prilocaine (EMLA) cream, Apply topically as needed. (Patient not taking: Reported on 09/23/2014), Disp: 30 g, Rfl: 3 .  ondansetron (ZOFRAN) 8 MG tablet, Take by mouth every 8 (eight) hours as needed for nausea or vomiting., Disp: , Rfl:  .  Pyridoxine HCl (VITAMIN B-6) 250 MG tablet, Take 1 tablet (250 mg total) by mouth daily., Disp: 30 tablet, Rfl: 6 .  zolpidem (AMBIEN CR) 12.5 MG CR tablet, Take 12.5 mg by mouth at bedtime as needed for sleep.  PT NEVER PICKED UP AT PHARMACY, Disp: , Rfl:   Allergies: No Known Allergies  Past Medical History, Surgical history, Social history, and Family History were reviewed and updated.  Review of Systems: As above  Physical Exam:  height is 5' 7"  (1.702 m) and weight is 228 lb (103.42 kg). His oral temperature is 98.7 F (37.1 C). His blood pressure is 115/73 and his pulse is 100. His respiration is 20.   Well-developed and well-nourished African American gentleman. His head and neck exam shows no ocular or oral lesions. He has no palpable cervical or supraclavicular lymph nodes. Lungs are clear bilaterally. I do not hear any wheezes. Cardiac exam regular rate and rhythm with no murmurs rubs or bruits. Abdomen is soft. He has a well-healed laparotomy scar. There is no fluid wave. There is no palpable liver or spleen tip.  He has no palpable abdominal mass.Back exam shows no tenderness over the spine ribs or hips. extremities shows no clubbing cyanosis or edema. Skin exam no rashes, ecchymosis or petechia. Neurological exam is nonfocal.  Lab Results  Component Value Date   WBC 4.9 09/23/2014   HGB 13.1 09/23/2014   HCT 40.9 09/23/2014   MCV 79* 09/23/2014   PLT 343 09/23/2014     Chemistry      Component Value Date/Time   NA 140 09/23/2014  1156   NA 139 02/04/2014 0835   K 3.8 09/23/2014 1156   K 4.1 02/04/2014 0835   CL 105 09/23/2014 1156   CL 105 02/04/2014 0835   CO2 27 09/23/2014 1156   CO2 24 02/04/2014 0835   BUN 10 09/23/2014 1156   BUN 12 02/04/2014 0835   CREATININE 1.2 09/23/2014 1156   CREATININE 1.02 02/04/2014 0835      Component Value Date/Time   CALCIUM 9.4 09/23/2014 1156   CALCIUM 9.1 02/04/2014 0835   ALKPHOS 76 09/23/2014 1156   ALKPHOS 75 02/04/2014 0835   AST 13 09/23/2014 1156   AST 14 02/04/2014 0835   ALT 12 09/23/2014 1156   ALT 18 02/04/2014 0835   BILITOT 0.60 09/23/2014 1156   BILITOT 0.4 02/04/2014 0835         Impression and Plan: Mr.  Conrad is 44 year old gentleman with metastatic colon cancer.   He looks good. He feels okay. He does have this "cold". Hopefully, some azithromycin will help him.  I think we really have to get a set of scans on him. When he comes back in one month, I will set him up with scans the [place] that we see him.  If the scans show that he has progressive disease, then I don't think we have any other choice but to get him back on to IV therapy.   I spent about 35 minutes with him today.   Volanda Napoleon, MD 2/3/20161:31 PM

## 2014-09-30 ENCOUNTER — Telehealth: Payer: Self-pay | Admitting: Hematology & Oncology

## 2014-09-30 NOTE — Telephone Encounter (Signed)
[employer] has provided pt with a temporary supply of LONSURF Tab 20-8.19.   P: [phone]

## 2014-10-07 ENCOUNTER — Emergency Department (HOSPITAL_COMMUNITY): Payer: Medicare Other

## 2014-10-07 ENCOUNTER — Inpatient Hospital Stay (HOSPITAL_COMMUNITY)
Admission: EM | Admit: 2014-10-07 | Discharge: 2014-10-08 | DRG: 375 | Disposition: A | Discharge status: Home / Self Care | Payer: Medicare Other | Attending: Internal Medicine | Admitting: Internal Medicine

## 2014-10-07 ENCOUNTER — Encounter (HOSPITAL_COMMUNITY): Payer: Self-pay | Admitting: Emergency Medicine

## 2014-10-07 DIAGNOSIS — R109 Unspecified abdominal pain: Secondary | ICD-10-CM

## 2014-10-07 DIAGNOSIS — Z9221 Personal history of antineoplastic chemotherapy: Secondary | ICD-10-CM

## 2014-10-07 DIAGNOSIS — C189 Malignant neoplasm of colon, unspecified: Secondary | ICD-10-CM | POA: Diagnosis present

## 2014-10-07 DIAGNOSIS — E611 Iron deficiency: Secondary | ICD-10-CM | POA: Diagnosis not present

## 2014-10-07 DIAGNOSIS — C772 Secondary and unspecified malignant neoplasm of intra-abdominal lymph nodes: Secondary | ICD-10-CM

## 2014-10-07 DIAGNOSIS — C786 Secondary malignant neoplasm of retroperitoneum and peritoneum: Principal | ICD-10-CM

## 2014-10-07 DIAGNOSIS — G893 Neoplasm related pain (acute) (chronic): Secondary | ICD-10-CM | POA: Diagnosis present

## 2014-10-07 DIAGNOSIS — D500 Iron deficiency anemia secondary to blood loss (chronic): Secondary | ICD-10-CM | POA: Diagnosis present

## 2014-10-07 DIAGNOSIS — K669 Disorder of peritoneum, unspecified: Secondary | ICD-10-CM | POA: Diagnosis not present

## 2014-10-07 DIAGNOSIS — K5660 Unspecified intestinal obstruction: Secondary | ICD-10-CM | POA: Diagnosis present

## 2014-10-07 DIAGNOSIS — C801 Malignant (primary) neoplasm, unspecified: Secondary | ICD-10-CM | POA: Diagnosis not present

## 2014-10-07 DIAGNOSIS — Z9049 Acquired absence of other specified parts of digestive tract: Secondary | ICD-10-CM | POA: Diagnosis present

## 2014-10-07 DIAGNOSIS — D649 Anemia, unspecified: Secondary | ICD-10-CM | POA: Diagnosis present

## 2014-10-07 DIAGNOSIS — C787 Secondary malignant neoplasm of liver and intrahepatic bile duct: Secondary | ICD-10-CM | POA: Diagnosis not present

## 2014-10-07 DIAGNOSIS — K5669 Other intestinal obstruction: Secondary | ICD-10-CM | POA: Diagnosis not present

## 2014-10-07 DIAGNOSIS — R110 Nausea: Secondary | ICD-10-CM

## 2014-10-07 DIAGNOSIS — Z79899 Other long term (current) drug therapy: Secondary | ICD-10-CM

## 2014-10-07 DIAGNOSIS — Z87891 Personal history of nicotine dependence: Secondary | ICD-10-CM | POA: Diagnosis not present

## 2014-10-07 DIAGNOSIS — C182 Malignant neoplasm of ascending colon: Secondary | ICD-10-CM | POA: Diagnosis present

## 2014-10-07 DIAGNOSIS — D509 Iron deficiency anemia, unspecified: Secondary | ICD-10-CM | POA: Diagnosis present

## 2014-10-07 HISTORY — DX: Malignant (primary) neoplasm, unspecified: C80.1

## 2014-10-07 LAB — URINALYSIS, ROUTINE W REFLEX MICROSCOPIC
Bilirubin Urine: NEGATIVE
Glucose, UA: NEGATIVE mg/dL
Hgb urine dipstick: NEGATIVE
KETONES UR: NEGATIVE mg/dL
LEUKOCYTES UA: NEGATIVE
NITRITE: NEGATIVE
PH: 5.5 (ref 5.0–8.0)
Protein, ur: NEGATIVE mg/dL
Specific Gravity, Urine: 1.022 (ref 1.005–1.030)
Urobilinogen, UA: 0.2 mg/dL (ref 0.0–1.0)

## 2014-10-07 LAB — COMPREHENSIVE METABOLIC PANEL
ALT: 12 U/L (ref 0–53)
AST: 13 U/L (ref 0–37)
Albumin: 3.7 g/dL (ref 3.5–5.2)
Alkaline Phosphatase: 78 U/L (ref 39–117)
Anion gap: 8 (ref 5–15)
BUN: 13 mg/dL (ref 6–23)
CALCIUM: 9.2 mg/dL (ref 8.4–10.5)
CO2: 28 mmol/L (ref 19–32)
Chloride: 106 mmol/L (ref 96–112)
Creatinine, Ser: 1.02 mg/dL (ref 0.50–1.35)
GFR calc non Af Amer: 88 mL/min — ABNORMAL LOW (ref >90)
GLUCOSE: 104 mg/dL — AB (ref 70–99)
POTASSIUM: 3.8 mmol/L (ref 3.5–5.1)
SODIUM: 142 mmol/L (ref 135–145)
Total Bilirubin: 0.9 mg/dL (ref 0.3–1.2)
Total Protein: 7.6 g/dL (ref 6.0–8.3)

## 2014-10-07 LAB — CBC
HCT: 39.2 % (ref 39.0–52.0)
Hemoglobin: 12.4 g/dL — ABNORMAL LOW (ref 13.0–17.0)
MCH: 25.4 pg — ABNORMAL LOW (ref 26.0–34.0)
MCHC: 31.6 g/dL (ref 30.0–36.0)
MCV: 80.2 fL (ref 78.0–100.0)
PLATELETS: 310 10*3/uL (ref 150–400)
RBC: 4.89 MIL/uL (ref 4.22–5.81)
RDW: 16.7 % — AB (ref 11.5–15.5)
WBC: 6 10*3/uL (ref 4.0–10.5)

## 2014-10-07 LAB — LIPASE, BLOOD: LIPASE: 28 U/L (ref 11–59)

## 2014-10-07 MED ORDER — ACETAMINOPHEN 650 MG RE SUPP: 650.0000 mg | Freq: Four times a day (QID) | RECTAL | Status: DC | PRN

## 2014-10-07 MED ORDER — SODIUM CHLORIDE 0.9 % IV BOLUS (SEPSIS)
1000.0000 mL | Freq: Once | INTRAVENOUS | Status: AC
  Administered 2014-10-07: 1000 mL via INTRAVENOUS

## 2014-10-07 MED ORDER — SODIUM CHLORIDE 0.9 % IV SOLN
INTRAVENOUS | Status: AC
  Administered 2014-10-07: 20:00:00 via INTRAVENOUS

## 2014-10-07 MED ORDER — ONDANSETRON HCL 4 MG/2ML IJ SOLN
4.0000 mg | Freq: Once | INTRAMUSCULAR | Status: AC
  Administered 2014-10-07: 4 mg via INTRAVENOUS
  Filled 2014-10-07: qty 2

## 2014-10-07 MED ORDER — DEXTROSE-NACL 5-0.9 % IV SOLN
INTRAVENOUS | Status: DC
  Administered 2014-10-08: 01:00:00 via INTRAVENOUS

## 2014-10-07 MED ORDER — ONDANSETRON HCL 4 MG/2ML IJ SOLN
4.0000 mg | Freq: Four times a day (QID) | INTRAMUSCULAR | Status: DC | PRN
  Administered 2014-10-08: 4 mg via INTRAVENOUS
  Filled 2014-10-07: qty 2

## 2014-10-07 MED ORDER — IOHEXOL 300 MG/ML  SOLN
100.0000 mL | Freq: Once | INTRAMUSCULAR | Status: AC | PRN
  Administered 2014-10-07: 100 mL via INTRAVENOUS

## 2014-10-07 MED ORDER — HYDROMORPHONE HCL 1 MG/ML IJ SOLN
1.0000 mg | INTRAMUSCULAR | Status: DC | PRN
  Administered 2014-10-07 – 2014-10-08 (×4): 1 mg via INTRAVENOUS
  Filled 2014-10-07 (×4): qty 1

## 2014-10-07 MED ORDER — HYDROMORPHONE HCL 1 MG/ML IJ SOLN: 1.0000 mg | INTRAMUSCULAR | Status: DC | PRN

## 2014-10-07 MED ORDER — HYDROMORPHONE HCL 1 MG/ML IJ SOLN
1.0000 mg | Freq: Once | INTRAMUSCULAR | Status: AC
  Administered 2014-10-07: 1 mg via INTRAVENOUS
  Filled 2014-10-07: qty 1

## 2014-10-07 MED ORDER — ACETAMINOPHEN 325 MG PO TABS: 650.0000 mg | ORAL_TABLET | Freq: Four times a day (QID) | ORAL | Status: DC | PRN

## 2014-10-07 MED ORDER — ONDANSETRON HCL 4 MG PO TABS: 4.0000 mg | ORAL_TABLET | Freq: Four times a day (QID) | ORAL | Status: DC | PRN

## 2014-10-07 MED ORDER — IBUPROFEN 200 MG PO TABS: 600.0000 mg | ORAL_TABLET | Freq: Four times a day (QID) | ORAL | Status: DC | PRN

## 2014-10-07 MED ORDER — ENOXAPARIN SODIUM 60 MG/0.6ML ~~LOC~~ SOLN
50.0000 mg | SUBCUTANEOUS | Status: DC
  Filled 2014-10-07 (×2): qty 0.6

## 2014-10-07 MED ORDER — ONDANSETRON HCL 4 MG/2ML IJ SOLN: 4.0000 mg | Freq: Three times a day (TID) | INTRAMUSCULAR | Status: DC | PRN

## 2014-10-07 NOTE — Progress Notes (Signed)
[facility] is [facility] [address] [place] [phone]

## 2014-10-07 NOTE — ED Notes (Signed)
Pt states that his colon cancer is back and has a lump on right side of his abd.  Pt taking oral chemo.  Pt woke up this morning with severe right abd pain.

## 2014-10-07 NOTE — H&P (Signed)
[facility] History and Physical  David Conrad FUX:[id number] DOB: May 08, 1971 DOA: 10/07/2014  Referring physician: ER physician. PCP: PROVIDER NOT IN SYSTEM   Chief Complaint:  Abdominal pain.  HPI: David Conrad is a 44 y.o. male  with known history of metastatic colon cancer who has had prior right hemicolectomy and chemotherapy presents to the ER because of increasing abdominal pain over the last 2 days. Patient denies any nausea vomiting or diarrhea. He did have a bowel movement today which was small amount. In the ER he had a CT abdomen and pelvis which was showing -   Multiple peritoneal based soft tissue nodules and masses throughout the abdomen compatible with peritoneal recurrence of tumor, largest at the lateral RIGHT mid abdomen 8.1 x 10.4 x 18.2 cm. ER physician had discussed with on-call oncologist who at this time is advised patient admission and will be seeing patient in consult. Patient otherwise denies any fever chills chest pain shortness of breath. Patient's pain is mostly in the lower quadrants. It is colicky in nature. Patient has not had any vomiting. On exam patient is mildly tender diffusely on the abdomen.   Review of Systems: As presented in the history of presenting illness, rest negative.  Past Medical History  Diagnosis Date  . Cancer     colon   Past Surgical History  Procedure Laterality Date  . Abdominal surgery     Social History:  reports that he has never smoked. He has never used smokeless tobacco. He reports that he does not drink alcohol or use illicit drugs. Where does patient live  home. Can patient participate in ADLs?  Yes.  No Known Allergies  Family History:  Family History  Problem Relation Age of Onset  . Colon cancer Maternal Grandfather       Prior to Admission medications   Medication Sig Start Date End Date Taking? Authorizing Provider  ibuprofen (ADVIL,MOTRIN) 600 MG tablet Take 600 mg by mouth every 6 (six)  hours as needed for moderate pain (pain).   Yes Historical Provider, MD  Trifluridine-Tipiracil (LONSURF PO) Take 80 mg by mouth daily. Take 4 tablets (80 mg) by mouth daily for 2 weeks. Take 2 Weeks Off. Then Repeat Again.   Yes Historical Provider, MD    Physical Exam: Filed Vitals:   10/07/14 1900 10/07/14 1930 10/07/14 2005 10/07/14 2008  BP: 114/61 109/57  132/87  Pulse: 81 98  83  Temp:    98.2 F (36.8 C)  TempSrc:    Oral  Resp: 27 24  20   Height:   5\' 8"  (1.727 m)   Weight:   103.874 kg (229 lb)   SpO2: 91% 99%  99%     General:  Well-developed and nourished.  Eyes:  Anicteric no pallor.  ENT:  No discharge from the ears eyes nose or mouth.  Neck:  No mass felt.  Cardiovascular:  S1-S2 heard.  Respiratory:  No rhonchi or crepitations.  Abdomen:  Mildly distended and tender diffusely no guarding or rigidity bowel sounds present.  Skin:  No rash.  Musculoskeletal:  No edema.  Psychiatric:  Appears normal.  Neurologic:  Alert awake oriented to time place and person. Moves all extremities.  Labs on Admission:  Basic Metabolic Panel:  Recent Labs Lab 10/07/14 1606  NA 142  K 3.8  CL 106  CO2 28  GLUCOSE 104*  BUN 13  CREATININE 1.02  CALCIUM 9.2   Liver Function Tests:  Recent Labs Lab 10/07/14  1606  AST 13  ALT 12  ALKPHOS 78  BILITOT 0.9  PROT 7.6  ALBUMIN 3.7    Recent Labs Lab 10/07/14 1606  LIPASE 28   No results for input(s): AMMONIA in the last 168 hours. CBC:  Recent Labs Lab 10/07/14 1606  WBC 6.0  HGB 12.4*  HCT 39.2  MCV 80.2  PLT 310   Cardiac Enzymes: No results for input(s): CKTOTAL, CKMB, CKMBINDEX, TROPONINI in the last 168 hours.  BNP (last 3 results) No results for input(s): BNP in the last 8760 hours.  ProBNP (last 3 results) No results for input(s): PROBNP in the last 8760 hours.  CBG: No results for input(s): GLUCAP in the last 168 hours.  Radiological Exams on Admission: Ct Abdomen Pelvis W  Contrast  10/07/2014   CLINICAL DATA:  Diffuse abdominal pain, nausea, history of carcinoma of the colon with tumor removal 3 years ago and chemotherapy  EXAM: CT ABDOMEN AND PELVIS WITH CONTRAST  TECHNIQUE: Multidetector CT imaging of the abdomen and pelvis was performed using the standard protocol following bolus administration of intravenous contrast. Sagittal and coronal MPR images reconstructed from axial data set.  CONTRAST:  111mL OMNIPAQUE IOHEXOL 300 MG/ML  SOLN  COMPARISON:  None  FINDINGS: Bibasilar atelectasis.  Spleen, pancreas, kidneys, and adrenal glands normal appearance.  Scalloped subcapsular lesion lateral aspect RIGHT lobe liver 3.2 x 1.3 cm image 20.  Additional tiny nonspecific low-attenuation foci in liver.  Additional small cyst within RIGHT lobe 14 x 13 mm image 21.  No additional abnormalities of the liver or gallbladder.  Multiple intermediate to low-attenuation masses are seen within the peritoneal cavity.  Large bilobed abnormality in the lateral RIGHT mid abdomen, 8.1 x 10.4 cm in axial dimensions image 38 extending 18.2 cm length.  Lesion in the LEFT mid abdomen 7.9 x 4.7 x 8.0 cm.  Lesions are most consistent with peritoneal tumor recurrence.  Multiple additional smaller peritoneal based nodules are seen at the anterior mid abdomen, LEFT mid abdomen, and pelvis.  No significant ascites, acute inflammatory process, or free intraperitoneal air.  Observed nodules abut multiple bowel loops without causing bowel obstruction or dilatation.  Prior RIGHT hemicolectomy with ileocolic anastomosis at mid transverse colon.  Scattered normal to mildly enlarged mesenteric lymph nodes.  RIGHT inguinal and small umbilical hernias containing fat.  Unremarkable bladder and ureters.  No acute osseous findings.  IMPRESSION: Prior RIGHT hemicolectomy with ileocolic anastomosis at mid transverse colon.  Multiple peritoneal based soft tissue nodules and masses throughout the abdomen compatible with  peritoneal recurrence of tumor, largest at the lateral RIGHT mid abdomen 8.1 x 10.4 x 18.2 cm.  No definite evidence of bowel obstruction.   Electronically Signed   By: Lavonia Dana M.D.   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 Abdominal pain with recurrence of patient's known history of metastatic colon cancer - at this time patient has been placed on full liquid diet and pain relief medication and gentle hydration. Oncologist to see patient in consult. Further recommendations per oncologist. #2. Anemia probably related to colon cancer - closely follow CBC.   DVT PLovenox.  Code Status:  Full code.  Family Communication:  None.  Disposition Plan:  Admit to inpatient.    Raequon Catanzaro N. [facility] Pager [phone].  If 7PM-7AM, please contact night-coverage [email or web address] Password  [email or web address] 10/07/2014, 9:08 PM

## 2014-10-07 NOTE — ED Notes (Signed)
Patient transported to CT 

## 2014-10-07 NOTE — ED Provider Notes (Signed)
CSN: [id number]     Arrival date & time 10/07/14  1508 History   First MD Initiated Contact with Patient 10/07/14 1554     Chief Complaint  Patient presents with  . Colon Cancer  . Abdominal Pain     (Consider location/radiation/quality/duration/timing/severity/associated sxs/prior Treatment) Patient is a 43 y.o. male presenting with abdominal pain. The history is provided by the patient.  Abdominal Pain Associated symptoms: no chest pain, no chills, no cough, no diarrhea, no dysuria, no fever, no shortness of breath, no sore throat and no vomiting   pt with hx colon ca, s/p partial colectomy approximately 3 yrs ago, presents w c/o mid to diffuse abdominal pain for the past 2-3 days. Constant. Dull. Moderate. Non-radiating. No specific exacerbating or alleviating factors. No dysuria or gu c/o. No back or flank pain. Mild constipation for past few days. Had small bm yesterday. Feels distended. No nv. No fever or chills. Denies hx pancreatitis, gallstones or pud.    Past Medical History  Diagnosis Date  . Cancer     colon   Past Surgical History  Procedure Laterality Date  . Abdominal surgery     No family history on file. History  Substance Use Topics  . Smoking status: Never Smoker   . Smokeless tobacco: Not on file  . Alcohol Use: No    Review of Systems  Constitutional: Negative for fever and chills.  HENT: Negative for sore throat.   Eyes: Negative for redness.  Respiratory: Negative for cough and shortness of breath.   Cardiovascular: Negative for chest pain.  Gastrointestinal: Positive for abdominal pain. Negative for vomiting and diarrhea.  Endocrine: Negative for polyuria.  Genitourinary: Negative for dysuria and flank pain.  Musculoskeletal: Negative for back pain and neck pain.  Skin: Negative for rash.  Neurological: Negative for headaches.  Hematological: Does not bruise/bleed easily.  Psychiatric/Behavioral: Negative for confusion.      Allergies   Review of patient's allergies indicates no known allergies.  Home Medications   Prior to Admission medications   Medication Sig Start Date End Date Taking? Authorizing Provider  ibuprofen (ADVIL,MOTRIN) 600 MG tablet Take 600 mg by mouth every 6 (six) hours as needed for moderate pain (pain).   Yes Historical Provider, MD  trifluridine-tipiracil (LONSURF) 20-8.19 MG tablet Take 35 mg/m2 trifluridine by mouth daily. Take 4 tablets by mouth daily   Yes Historical Provider, MD   BP 147/94 mmHg  Pulse 102  Temp(Src) 97.7 F (36.5 C) (Oral)  Resp 18  SpO2 98% Physical Exam  Constitutional: He is oriented to person, place, and time. He appears well-developed and well-nourished. No distress.  HENT:  Mouth/Throat: Oropharynx is clear and moist.  Eyes: Conjunctivae are normal. No scleral icterus.  Neck: Neck supple. No tracheal deviation present.  Cardiovascular: Normal rate, regular rhythm, normal heart sounds and intact distal pulses.  Exam reveals no gallop and no friction rub.   No murmur heard. Pulmonary/Chest: Effort normal and breath sounds normal. No accessory muscle usage. No respiratory distress. He exhibits no tenderness.  Abdominal: Soft. Bowel sounds are normal. He exhibits distension. He exhibits no mass. There is tenderness. There is no rebound and no guarding.  Mildly distended. Mid to upper abd tenderness, moderate. No incarcerated hernia.   Genitourinary:  No cva tenderness  Musculoskeletal: Normal range of motion. He exhibits no edema or tenderness.  Neurological: He is alert and oriented to person, place, and time.  Skin: Skin is warm and dry. No rash noted. He  is not diaphoretic.  Psychiatric: He has a normal mood and affect.  Nursing note and vitals reviewed.   ED Course  Procedures (including critical care time) Labs Review  Results for orders placed or performed during the hospital encounter of 10/07/14  Comprehensive metabolic panel  Result Value Ref Range    Sodium 142 135 - 145 mmol/L   Potassium 3.8 3.5 - 5.1 mmol/L   Chloride 106 96 - 112 mmol/L   CO2 28 19 - 32 mmol/L   Glucose, Bld 104 (H) 70 - 99 mg/dL   BUN 13 6 - 23 mg/dL   Creatinine, Ser 1.02 0.50 - 1.35 mg/dL   Calcium 9.2 8.4 - 10.5 mg/dL   Total Protein 7.6 6.0 - 8.3 g/dL   Albumin 3.7 3.5 - 5.2 g/dL   AST 13 0 - 37 U/L   ALT 12 0 - 53 U/L   Alkaline Phosphatase 78 39 - 117 U/L   Total Bilirubin 0.9 0.3 - 1.2 mg/dL   GFR calc non Af Amer 88 (L) >90 mL/min   GFR calc Af Amer >90 >90 mL/min   Anion gap 8 5 - 15  CBC  Result Value Ref Range   WBC 6.0 4.0 - 10.5 K/uL   RBC 4.89 4.22 - 5.81 MIL/uL   Hemoglobin 12.4 (L) 13.0 - 17.0 g/dL   HCT 39.2 39.0 - 52.0 %   MCV 80.2 78.0 - 100.0 fL   MCH 25.4 (L) 26.0 - 34.0 pg   MCHC 31.6 30.0 - 36.0 g/dL   RDW 16.7 (H) 11.5 - 15.5 %   Platelets 310 150 - 400 K/uL  Lipase, blood  Result Value Ref Range   Lipase 28 11 - 59 U/L  Urinalysis, Routine w reflex microscopic  Result Value Ref Range   Color, Urine YELLOW YELLOW   APPearance CLEAR CLEAR   Specific Gravity, Urine 1.022 1.005 - 1.030   pH 5.5 5.0 - 8.0   Glucose, UA NEGATIVE NEGATIVE mg/dL   Hgb urine dipstick NEGATIVE NEGATIVE   Bilirubin Urine NEGATIVE NEGATIVE   Ketones, ur NEGATIVE NEGATIVE mg/dL   Protein, ur NEGATIVE NEGATIVE mg/dL   Urobilinogen, UA 0.2 0.0 - 1.0 mg/dL   Nitrite NEGATIVE NEGATIVE   Leukocytes, UA NEGATIVE NEGATIVE   Ct Abdomen Pelvis W Contrast  10/07/2014   CLINICAL DATA:  Diffuse abdominal pain, nausea, history of carcinoma of the colon with tumor removal 3 years ago and chemotherapy  EXAM: CT ABDOMEN AND PELVIS WITH CONTRAST  TECHNIQUE: Multidetector CT imaging of the abdomen and pelvis was performed using the standard protocol following bolus administration of intravenous contrast. Sagittal and coronal MPR images reconstructed from axial data set.  CONTRAST:  165mL OMNIPAQUE IOHEXOL 300 MG/ML  SOLN  COMPARISON:  None  FINDINGS: Bibasilar  atelectasis.  Spleen, pancreas, kidneys, and adrenal glands normal appearance.  Scalloped subcapsular lesion lateral aspect RIGHT lobe liver 3.2 x 1.3 cm image 20.  Additional tiny nonspecific low-attenuation foci in liver.  Additional small cyst within RIGHT lobe 14 x 13 mm image 21.  No additional abnormalities of the liver or gallbladder.  Multiple intermediate to low-attenuation masses are seen within the peritoneal cavity.  Large bilobed abnormality in the lateral RIGHT mid abdomen, 8.1 x 10.4 cm in axial dimensions image 38 extending 18.2 cm length.  Lesion in the LEFT mid abdomen 7.9 x 4.7 x 8.0 cm.  Lesions are most consistent with peritoneal tumor recurrence.  Multiple additional smaller peritoneal based nodules are seen  at the anterior mid abdomen, LEFT mid abdomen, and pelvis.  No significant ascites, acute inflammatory process, or free intraperitoneal air.  Observed nodules abut multiple bowel loops without causing bowel obstruction or dilatation.  Prior RIGHT hemicolectomy with ileocolic anastomosis at mid transverse colon.  Scattered normal to mildly enlarged mesenteric lymph nodes.  RIGHT inguinal and small umbilical hernias containing fat.  Unremarkable bladder and ureters.  No acute osseous findings.  IMPRESSION: Prior RIGHT hemicolectomy with ileocolic anastomosis at mid transverse colon.  Multiple peritoneal based soft tissue nodules and masses throughout the abdomen compatible with peritoneal recurrence of tumor, largest at the lateral RIGHT mid abdomen 8.1 x 10.4 x 18.2 cm.  No definite evidence of bowel obstruction.   Electronically Signed   By: Lavonia Dana M.D.   On: 10/07/2014 19:03      MDM  Iv ns. Labs. Dilaudid 1 mg iv. zofran iv.  Reviewed nursing notes and prior charts for additional history.   Pt references several prior hospital visits/tx/studies, however not in epic under current mr # - was able to find patient with different MR # in computer  -  On review of those visits pt  noted with progressive metastatic disease.   Discussed ct w pt.  Recheck mildly improved w meds however pain and nausea persists. Recheck abd soft mild tenderness.  Dilaudid iv. zofran iv.  Discussed w heme/onc on call, who indicates she will let Dr Marin Olp know, and have him see pt in hospital, and to admit to hospitalist service.     Mirna Mires, MD 10/07/14 [phone]

## 2014-10-08 ENCOUNTER — Encounter (HOSPITAL_COMMUNITY): Payer: Self-pay

## 2014-10-08 ENCOUNTER — Inpatient Hospital Stay (HOSPITAL_COMMUNITY)
Admission: EM | Admit: 2014-10-08 | Discharge: 2014-10-13 | Disposition: A | Discharge status: Home / Self Care | Payer: Medicare Other | Source: Ambulatory Visit | Attending: Internal Medicine | Admitting: Internal Medicine

## 2014-10-08 DIAGNOSIS — C787 Secondary malignant neoplasm of liver and intrahepatic bile duct: Secondary | ICD-10-CM

## 2014-10-08 DIAGNOSIS — R109 Unspecified abdominal pain: Secondary | ICD-10-CM

## 2014-10-08 DIAGNOSIS — K5660 Unspecified intestinal obstruction: Secondary | ICD-10-CM | POA: Diagnosis present

## 2014-10-08 DIAGNOSIS — C182 Malignant neoplasm of ascending colon: Secondary | ICD-10-CM

## 2014-10-08 DIAGNOSIS — G893 Neoplasm related pain (acute) (chronic): Secondary | ICD-10-CM | POA: Diagnosis present

## 2014-10-08 DIAGNOSIS — C772 Secondary and unspecified malignant neoplasm of intra-abdominal lymph nodes: Secondary | ICD-10-CM

## 2014-10-08 DIAGNOSIS — C189 Malignant neoplasm of colon, unspecified: Secondary | ICD-10-CM

## 2014-10-08 DIAGNOSIS — K913 Postprocedural intestinal obstruction, unspecified as to partial versus complete: Secondary | ICD-10-CM

## 2014-10-08 DIAGNOSIS — K56609 Unspecified intestinal obstruction, unspecified as to partial versus complete obstruction: Secondary | ICD-10-CM

## 2014-10-08 DIAGNOSIS — D649 Anemia, unspecified: Secondary | ICD-10-CM

## 2014-10-08 DIAGNOSIS — C786 Secondary malignant neoplasm of retroperitoneum and peritoneum: Principal | ICD-10-CM | POA: Diagnosis present

## 2014-10-08 DIAGNOSIS — Z87891 Personal history of nicotine dependence: Secondary | ICD-10-CM

## 2014-10-08 DIAGNOSIS — D500 Iron deficiency anemia secondary to blood loss (chronic): Secondary | ICD-10-CM | POA: Insufficient documentation

## 2014-10-08 DIAGNOSIS — Z9221 Personal history of antineoplastic chemotherapy: Secondary | ICD-10-CM

## 2014-10-08 DIAGNOSIS — Z9049 Acquired absence of other specified parts of digestive tract: Secondary | ICD-10-CM | POA: Diagnosis present

## 2014-10-08 LAB — CREATININE, SERUM
Creatinine, Ser: 0.95 mg/dL (ref 0.50–1.35)
GFR calc Af Amer: >90 mL/min (ref >90)

## 2014-10-08 LAB — CBC WITH DIFFERENTIAL/PLATELET
Basophils Absolute: 0 10*3/uL (ref 0.0–0.1)
Basophils Relative: 0 % (ref 0–1)
EOS ABS: 0.1 10*3/uL (ref 0.0–0.7)
EOS PCT: 2 % (ref 0–5)
HEMATOCRIT: 39 % (ref 39.0–52.0)
HEMOGLOBIN: 12 g/dL — AB (ref 13.0–17.0)
Lymphocytes Relative: 20 % (ref 12–46)
Lymphs Abs: 1 10*3/uL (ref 0.7–4.0)
MCH: 24.7 pg — AB (ref 26.0–34.0)
MCHC: 30.8 g/dL (ref 30.0–36.0)
MCV: 80.2 fL (ref 78.0–100.0)
MONO ABS: 0.5 10*3/uL (ref 0.1–1.0)
MONOS PCT: 9 % (ref 3–12)
Neutro Abs: 3.6 10*3/uL (ref 1.7–7.7)
Neutrophils Relative %: 69 % (ref 43–77)
Platelets: 315 10*3/uL (ref 150–400)
RBC: 4.86 MIL/uL (ref 4.22–5.81)
RDW: 16.6 % — ABNORMAL HIGH (ref 11.5–15.5)
WBC: 5.2 10*3/uL (ref 4.0–10.5)

## 2014-10-08 LAB — COMPREHENSIVE METABOLIC PANEL
ALT: 13 U/L (ref 0–53)
AST: 13 U/L (ref 0–37)
Albumin: 3.4 g/dL — ABNORMAL LOW (ref 3.5–5.2)
Alkaline Phosphatase: 77 U/L (ref 39–117)
Anion gap: 10 (ref 5–15)
BUN: 8 mg/dL (ref 6–23)
CALCIUM: 9.2 mg/dL (ref 8.4–10.5)
CO2: 28 mmol/L (ref 19–32)
CREATININE: 0.94 mg/dL (ref 0.50–1.35)
Chloride: 99 mmol/L (ref 96–112)
GFR calc non Af Amer: >90 mL/min (ref >90)
GLUCOSE: 109 mg/dL — AB (ref 70–99)
Potassium: 4.1 mmol/L (ref 3.5–5.1)
SODIUM: 137 mmol/L (ref 135–145)
Total Bilirubin: 0.5 mg/dL (ref 0.3–1.2)
Total Protein: 7.4 g/dL (ref 6.0–8.3)

## 2014-10-08 LAB — IRON AND TIBC
IRON: 14 ug/dL — AB (ref 42–165)
Saturation Ratios: 6 % — ABNORMAL LOW (ref 20–55)
TIBC: 234 ug/dL (ref 215–435)
UIBC: 220 ug/dL (ref 125–400)

## 2014-10-08 LAB — FOLATE: Folate: 12.1 ng/mL

## 2014-10-08 LAB — RETICULOCYTES
RBC.: 4.77 MIL/uL (ref 4.22–5.81)
RETIC CT PCT: 0.9 % (ref 0.4–3.1)
Retic Count, Absolute: 42.9 10*3/uL (ref 19.0–186.0)

## 2014-10-08 LAB — FERRITIN: Ferritin: 109 ng/mL (ref 22–322)

## 2014-10-08 LAB — VITAMIN B12: Vitamin B-12: 487 pg/mL (ref 211–911)

## 2014-10-08 MED ORDER — POLYETHYLENE GLYCOL 3350 17 G PO PACK: 17.0000 g | PACK | Freq: Every day | ORAL | Status: DC

## 2014-10-08 MED ORDER — HYDROMORPHONE HCL 2 MG/ML IJ SOLN
2.0000 mg | INTRAMUSCULAR | Status: DC | PRN
  Administered 2014-10-09 – 2014-10-13 (×6): 2 mg via INTRAVENOUS
  Filled 2014-10-08 (×7): qty 1

## 2014-10-08 MED ORDER — LACTULOSE 10 GM/15ML PO SOLN
20.0000 g | Freq: Three times a day (TID) | ORAL | Status: DC
  Administered 2014-10-08: 20 g via ORAL
  Filled 2014-10-08 (×3): qty 30

## 2014-10-08 MED ORDER — ENSURE PUDDING PO PUDG
1.0000 | Freq: Three times a day (TID) | ORAL | Status: DC
  Administered 2014-10-08: 1 via ORAL
  Filled 2014-10-08 (×13): qty 1

## 2014-10-08 MED ORDER — ONDANSETRON HCL 4 MG/2ML IJ SOLN
4.0000 mg | Freq: Four times a day (QID) | INTRAMUSCULAR | Status: DC | PRN
  Administered 2014-10-10: 4 mg via INTRAVENOUS
  Filled 2014-10-08 (×3): qty 2

## 2014-10-08 MED ORDER — LACTULOSE 10 GM/15ML PO SOLN
20.0000 g | Freq: Every day | ORAL | Status: DC | PRN
  Filled 2014-10-08: qty 30

## 2014-10-08 MED ORDER — PANTOPRAZOLE SODIUM 40 MG IV SOLR
40.0000 mg | Freq: Two times a day (BID) | INTRAVENOUS | Status: DC
  Administered 2014-10-08 – 2014-10-12 (×9): 40 mg via INTRAVENOUS
  Filled 2014-10-08 (×11): qty 40

## 2014-10-08 MED ORDER — BOOST / RESOURCE BREEZE PO LIQD: 1.0000 | Freq: Three times a day (TID) | ORAL | Status: DC

## 2014-10-08 MED ORDER — POLYETHYLENE GLYCOL 3350 17 G PO PACK
17.0000 g | PACK | Freq: Every day | ORAL | Status: DC
  Administered 2014-10-08: 17 g via ORAL
  Filled 2014-10-08: qty 1

## 2014-10-08 MED ORDER — FENTANYL 25 MCG/HR TD PT72
25.0000 ug | MEDICATED_PATCH | TRANSDERMAL | Status: DC
  Administered 2014-10-11: 25 ug via TRANSDERMAL
  Filled 2014-10-08: qty 1

## 2014-10-08 MED ORDER — IBUPROFEN 200 MG PO TABS: 600.0000 mg | ORAL_TABLET | Freq: Four times a day (QID) | ORAL | Status: DC | PRN

## 2014-10-08 MED ORDER — FENTANYL 25 MCG/HR TD PT72
25.0000 ug | MEDICATED_PATCH | TRANSDERMAL | Status: DC
  Administered 2014-10-08: 25 ug via TRANSDERMAL
  Filled 2014-10-08: qty 1

## 2014-10-08 MED ORDER — HEPARIN SODIUM (PORCINE) 5000 UNIT/ML IJ SOLN
5000.0000 [IU] | Freq: Three times a day (TID) | INTRAMUSCULAR | Status: DC
  Filled 2014-10-08 (×17): qty 1

## 2014-10-08 MED ORDER — ONDANSETRON HCL 4 MG/2ML IJ SOLN
4.0000 mg | Freq: Four times a day (QID) | INTRAMUSCULAR | Status: DC | PRN
  Administered 2014-10-08 – 2014-10-11 (×4): 4 mg via INTRAVENOUS
  Filled 2014-10-08 (×2): qty 2

## 2014-10-08 MED ORDER — DEXAMETHASONE SODIUM PHOSPHATE 10 MG/ML IJ SOLN
20.0000 mg | Freq: Two times a day (BID) | INTRAMUSCULAR | Status: AC
  Administered 2014-10-08 – 2014-10-11 (×6): 20 mg via INTRAVENOUS
  Filled 2014-10-08 (×5): qty 2
  Filled 2014-10-08: qty 5

## 2014-10-08 MED ORDER — ONDANSETRON HCL 4 MG PO TABS
4.0000 mg | ORAL_TABLET | Freq: Four times a day (QID) | ORAL | Status: DC | PRN
  Administered 2014-10-09: 4 mg via ORAL
  Filled 2014-10-08: qty 1

## 2014-10-08 MED ORDER — POLYETHYLENE GLYCOL 3350 17 G PO PACK
17.0000 g | PACK | Freq: Every day | ORAL | Status: DC
  Administered 2014-10-09 – 2014-10-13 (×5): 17 g via ORAL
  Filled 2014-10-08 (×5): qty 1

## 2014-10-08 MED ORDER — DEXTROSE-NACL 5-0.45 % IV SOLN
INTRAVENOUS | Status: DC
  Administered 2014-10-09 (×2): via INTRAVENOUS
  Administered 2014-10-10 (×2): 100 mL/h via INTRAVENOUS
  Administered 2014-10-11: 22:00:00 via INTRAVENOUS

## 2014-10-08 NOTE — Progress Notes (Signed)
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 - Continue narcotics for pain control awaiting oncology recommendations. - As bowel movement more than 3 days ago start him on MiraLAX and tablet enema. - He still undecided about what to do.  Normocytic anemia - RDW high MCV borderline low, with a history of colon cancer check an anemia panel mostly likely iron deficiency anemia.   AP (abdominal pain)     Code Status: full Family Communication: none  Disposition Plan: inpatient   Consultants:  oncology  Procedures: CT abd and pelvis 2.17.2016: Multiple peritoneal based soft tissue nodules and masses throughout the abdomen compatible with peritoneal recurrence of tumor, largest at the lateral RIGHT mid abdomen 8.1 x 10.4 x 18.2 cm.  Antibiotics:  None  HPI/Subjective: Relates his pain is controlled, has not had a bowel movement in over 3 days.  Objective: Filed Vitals:   10/07/14 1930 10/07/14 2005 10/07/14 2008 10/08/14 0610  BP: 109/57  132/87 125/89  Pulse: 98  83 90  Temp:   98.2 F (36.8 C) 99.3 F (37.4 C)  TempSrc:   Oral Oral  Resp: 24  20 20   Height:  5\' 8"  (1.727 m)    Weight:  103.874 kg (229 lb)    SpO2: 99%  99% 95%    Intake/Output Summary (Last 24 hours) at 10/08/14 0811 Last data filed at 10/08/14 0600  Gross per 24 hour  Intake   2380 ml  Output      0 ml  Net   2380 ml   Filed Weights   10/07/14 2005  Weight: 103.874 kg (229 lb)    Exam:  General: Alert, awake, oriented x3, in no acute distress.  HEENT: No bruits, no goiter.  Heart: Regular rate and rhythm. Lungs: Good air movement, clear Abdomen: Soft, diffuse tenderness, slightly distended positive bowel sounds. Neuro: Grossly intact, nonfocal.   Data Reviewed: Basic Metabolic Panel:  Recent Labs Lab 10/07/14 1606 10/08/14 0514  NA 142 137  K 3.8  4.1  CL 106 99  CO2 28 28  GLUCOSE 104* 109*  BUN 13 8  CREATININE 1.02 0.94  CALCIUM 9.2 9.2   Liver Function Tests:  Recent Labs Lab 10/07/14 1606 10/08/14 0514  AST 13 13  ALT 12 13  ALKPHOS 78 77  BILITOT 0.9 0.5  PROT 7.6 7.4  ALBUMIN 3.7 3.4*    Recent Labs Lab 10/07/14 1606  LIPASE 28   No results for input(s): AMMONIA in the last 168 hours. CBC:  Recent Labs Lab 10/07/14 1606 10/08/14 0514  WBC 6.0 5.2  NEUTROABS  --  3.6  HGB 12.4* 12.0*  HCT 39.2 39.0  MCV 80.2 80.2  PLT 310 315   Cardiac Enzymes: No results for input(s): CKTOTAL, CKMB, CKMBINDEX, TROPONINI in the last 168 hours. BNP (last 3 results) No results for input(s): BNP in the last 8760 hours.  ProBNP (last 3 results) No results for input(s): PROBNP in the last 8760 hours.  CBG: No results for input(s): GLUCAP in the last 168 hours.  No results found for this or any previous visit (from the past 240 hour(s)).   Studies: Ct Abdomen Pelvis W Contrast  10/07/2014   CLINICAL DATA:  Diffuse abdominal pain, nausea, history of carcinoma of the colon with tumor removal 3 years ago and chemotherapy  EXAM: CT ABDOMEN AND PELVIS WITH CONTRAST  TECHNIQUE: Multidetector  CT imaging of the abdomen and pelvis was performed using the standard protocol following bolus administration of intravenous contrast. Sagittal and coronal MPR images reconstructed from axial data set.  CONTRAST:  170mL OMNIPAQUE IOHEXOL 300 MG/ML  SOLN  COMPARISON:  None  FINDINGS: Bibasilar atelectasis.  Spleen, pancreas, kidneys, and adrenal glands normal appearance.  Scalloped subcapsular lesion lateral aspect RIGHT lobe liver 3.2 x 1.3 cm image 20.  Additional tiny nonspecific low-attenuation foci in liver.  Additional small cyst within RIGHT lobe 14 x 13 mm image 21.  No additional abnormalities of the liver or gallbladder.  Multiple intermediate to low-attenuation masses are seen within the peritoneal cavity.  Large bilobed  abnormality in the lateral RIGHT mid abdomen, 8.1 x 10.4 cm in axial dimensions image 38 extending 18.2 cm length.  Lesion in the LEFT mid abdomen 7.9 x 4.7 x 8.0 cm.  Lesions are most consistent with peritoneal tumor recurrence.  Multiple additional smaller peritoneal based nodules are seen at the anterior mid abdomen, LEFT mid abdomen, and pelvis.  No significant ascites, acute inflammatory process, or free intraperitoneal air.  Observed nodules abut multiple bowel loops without causing bowel obstruction or dilatation.  Prior RIGHT hemicolectomy with ileocolic anastomosis at mid transverse colon.  Scattered normal to mildly enlarged mesenteric lymph nodes.  RIGHT inguinal and small umbilical hernias containing fat.  Unremarkable bladder and ureters.  No acute osseous findings.  IMPRESSION: Prior RIGHT hemicolectomy with ileocolic anastomosis at mid transverse colon.  Multiple peritoneal based soft tissue nodules and masses throughout the abdomen compatible with peritoneal recurrence of tumor, largest at the lateral RIGHT mid abdomen 8.1 x 10.4 x 18.2 cm.  No definite evidence of bowel obstruction.   Electronically Signed   By: Lavonia Dana M.D.   On: 10/07/2014 19:03    Scheduled Meds: . enoxaparin (LOVENOX) injection  50 mg Subcutaneous Q24H  . fentaNYL  25 mcg Transdermal Q72H  . lactulose  20 g Oral TID   Continuous Infusions: . dextrose 5 % and 0.9% NaCl 100 mL/hr at 10/08/14 Calio, ABRAHAM  [facility] Pager [phone]. If 8PM-8AM, please contact night-coverage at [email or web address], password [facility] 10/08/2014, 8:11 AM  LOS: 1 day

## 2014-10-08 NOTE — Progress Notes (Signed)
Patient is aware of MD order for tap water enema,however RN has offered to do Enema multiple times throughout this shift and patient continues to say he will let me know when he is ready. Informed patient that the enema may help with his abd pain because it would help him to have a bowel movement. Patient states he will let me know when he is ready for it. Will continue to monitor patient. C.Dorlene Footman,RN

## 2014-10-08 NOTE — Consult Note (Signed)
Referral MD  Reason for Referral: Metastatic colon cancer   Chief Complaint  Patient presents with  . Colon Cancer  . Abdominal Pain  : I'm having a lot of pain in my right abdomen  HPI: David Conrad is a very nice 44 year old African-American male. He is well-known to me.  Unfortunately, the medical record that they have [place] now is not his normal medical record number. All of his other scans and records are under his usual medical record number.  He has metastatic colon cancer. He is on oral therapy for this. He is taking Lonsurf. This is a combination oral chemotherapy drug. I think he's been diligent in taking this.  He had his last CT scan done back about a month ago.  He now comes in with severe right-sided abdominal pain. He had a CT scan done. Again, because he had a different medical record number this admission, his last scans were not looked at.  He has in all likelihood, progressive disease. His last CEA was 21.  He's not had bowel movement in 5 days. This might be part of the problem.  He's had no nausea vomiting.  By the CT scan done, there is no obvious obstruction.  He's had no fever. He's had no bleeding. He's had no leg swelling.  I saw him in the office probably 2 or 3 weeks ago. He looked real good. He was tolerating the Lonsurf pretty well.  His weight has been holding pretty steady.  We are asked to see him to help with management issues.      Past Medical History  Diagnosis Date  . Cancer     colon  :  Past Surgical History  Procedure Laterality Date  . Abdominal surgery    :   Current facility-administered medications:  .  0.9 %  sodium chloride infusion, , Intravenous, STAT, Mirna Mires, MD, Last Rate: 150 mL/hr at 10/07/14 2006 .  acetaminophen (TYLENOL) tablet 650 mg, 650 mg, Oral, Q6H PRN **OR** acetaminophen (TYLENOL) suppository 650 mg, 650 mg, Rectal, Q6H PRN, Rise Patience, MD .  dextrose 5 %-0.9 % sodium chloride  infusion, , Intravenous, Continuous, Rise Patience, MD, Last Rate: 100 mL/hr at 10/08/14 0103 .  enoxaparin (LOVENOX) injection 50 mg, 50 mg, Subcutaneous, Q24H, Rise Patience, MD, 50 mg at 10/07/14 2200 .  HYDROmorphone (DILAUDID) injection 1 mg, 1 mg, Intravenous, Q4H PRN, Rise Patience, MD, 1 mg at 10/08/14 0518 .  ibuprofen (ADVIL,MOTRIN) tablet 600 mg, 600 mg, Oral, Q6H PRN, Rise Patience, MD .  ondansetron (ZOFRAN) tablet 4 mg, 4 mg, Oral, Q6H PRN **OR** ondansetron (ZOFRAN) injection 4 mg, 4 mg, Intravenous, Q6H PRN, Rise Patience, MD:  . sodium chloride   Intravenous STAT  . enoxaparin (LOVENOX) injection  50 mg Subcutaneous Q24H  :  No Known Allergies:  Family History  Problem Relation Age of Onset  . Colon cancer Maternal Grandfather   :  History   Social History  . Marital Status: Single    Spouse Name: N/A  . Number of Children: N/A  . Years of Education: N/A   Occupational History  . Not on file.   Social History Main Topics  . Smoking status: Never Smoker   . Smokeless tobacco: Never Used  . Alcohol Use: No  . Drug Use: No  . Sexual Activity: Not on file   Other Topics Concern  . Not on file   Social History Narrative  .  No narrative on file  :  Pertinent items are noted in HPI.  Exam: Patient Vitals for the past 24 hrs:  BP Temp Temp src Pulse Resp SpO2 Height Weight  10/08/14 0610 125/89 mmHg 99.3 F (37.4 C) Oral 90 20 95 % - -  10/07/14 2008 132/87 mmHg 98.2 F (36.8 C) Oral 83 20 99 % - -  10/07/14 2005 - - - - - - 5\' 8"  (1.727 m) 229 lb (103.874 kg)  10/07/14 1930 109/57 mmHg - - 98 24 99 % - -  10/07/14 1900 114/61 mmHg - - 81 (!) 27 91 % - -  10/07/14 1835 129/70 mmHg - - 96 19 95 % - -  10/07/14 1800 118/70 mmHg - - 89 18 97 % - -  10/07/14 1700 141/95 mmHg - - 88 (!) 31 (!) 89 % - -  10/07/14 1514 147/94 mmHg 97.7 F (36.5 C) Oral 102 18 98 % - -    well-developed and well-nourished African-American  gentleman. Vital signs are temperature of 99.3. Pulse 90. Blood pressure 125/89. Weight is 229 pounds. Head and neck exam shows no ocular or oral lesions. There are no palpable cervical or supraclavicular lymph nodes. Lungs are clear. Cardiac exam regular rate and rhythm with a normal S1 and S2. There are no murmurs, rubs or bruits. Abdomen is soft. He has a well-healed laparotomy scar. He is slightly distended. There is some tenderness in the right lower quadrant. There is some fullness in the right lower quadrant. There is no fluid wave. There is no palpable liver or spleen tip. Extremities shows no clubbing, cyanosis or edema. Neurological exam shows no focal deficits.    Recent Labs  10/07/14 1606 10/08/14 0514  WBC 6.0 5.2  HGB 12.4* 12.0*  HCT 39.2 39.0  PLT 310 315    Recent Labs  10/07/14 1606 10/08/14 0514  NA 142 137  K 3.8 4.1  CL 106 99  CO2 28 28  GLUCOSE 104* 109*  BUN 13 8  CREATININE 1.02 0.94  CALCIUM 9.2 9.2    Blood smear review: None  Pathology: None     Assessment and Plan: David Conrad is a 44 year old African-American male. He has metastatic colon cancer.  He initially presented back in October 2013. He had stage III disease. He got systemic chemotherapy for this. About a year later, he relapsed. He was on systemic IV chemotherapy area and he had some toxicity with this. We had him off treatment for a while. His disease remained pretty stable and then began to regress. Got him back on 2 IV chemotherapy. Again, he had problems with this. He really did not want any IV chemotherapy.  I had referred him out. To [facility] hospital after we found that he had recurrence. I thought maybe he would be a candidate for debulking in intraperineal chemotherapy. He declined to do this.  He has quite an amount of bulky disease in the abdomen. I'm sure that his pain is coming from his disease.  I will speak with one of the surgeons. I wonder if he would be a candidate for  debulking. I know that this might be controversial. He is quite young. He has a very good performance status so I think we need to entertain aggressive intervention if possible.  I think getting him back onto IV chemotherapy would be another option although I think that response rates probably would be less than 30%.  I think getting him to go to  the bathroom might help. I will get him on some lactulose.  I think he might benefit from a Duragesic patch. I will have him ordered some.  In reality, he is done quite well area and he's had a lot of family issues that he's had a deal with. He's had several breaks in chemotherapy because of family issues that needed his attention.  I realize that this is not curable. I does want to try to do what we can to help his quality of life and was allow him to be functional. This is important to him.  I appreciate all the great care that he is getting. We will follow him along.  Stormy Card 3:23

## 2014-10-08 NOTE — Progress Notes (Signed)
INITIAL NUTRITION ASSESSMENT  DOCUMENTATION CODES Per approved criteria  -Obesity Unspecified   INTERVENTION: - Resource Breeze po TID, each supplement provides 250 kcal and 9 grams of protein - Diet advancement per MD - RD will continue to monitor  NUTRITION DIAGNOSIS: Inadequate oral intake related to n/v as evidenced by reported poor po intake and minor wt loss.   Goal: Pt to meet >/= 90% of their estimated nutrition needs   Monitor:  Weight trend, po intake, acceptance of supplements, labs  Reason for Assessment: Malnutrition Screening Tool  44 y.o. male  Admitting Dx: Abdominal pain  ASSESSMENT: 44 y.o. male with known history of metastatic colon cancer who has had prior right hemicolectomy and chemotherapy presents to the ER because of increasing abdominal pain over the last 2 days. Patient denies any nausea vomiting or diarrhea. He did have a bowel movement today which was small amount.  - Pt with c/o poor appetite due to nausea. He consumed <25% of full liquid tray.  - Pt reports two episodes of emesis.  - He reports that he thinks it will improve with solid food.  - Pt with 4 lb wt loss recently.  - No signs of fat or muscle wasting.  - Encourage PO intake. RD will order nutritional supplements.  - Labs reviewed.   Height: Ht Readings from Last 1 Encounters:  10/07/14 5\' 8"  (1.727 m)    Weight: Wt Readings from Last 1 Encounters:  10/07/14 229 lb (103.874 kg)    Ideal Body Weight: 68.4 kg  % Ideal Body Weight: 151%  Wt Readings from Last 10 Encounters:  10/07/14 229 lb (103.874 kg)    Usual Body Weight: 233 lbs  % Usual Body Weight: 98%  BMI:  Body mass index is 34.83 kg/(m^2).  Estimated Nutritional Needs: Kcal: 2000-2200 Protein: 130-140 g Fluid: 2.2 L/day  Skin: intact  Diet Order: Diet full liquid  EDUCATION NEEDS: -Education needs addressed   Intake/Output Summary (Last 24 hours) at 10/08/14 1245 Last data filed at 10/08/14  1000  Gross per 24 hour  Intake   2740 ml  Output      0 ml  Net   2740 ml    Last BM: prior to admission   Labs:   Recent Labs Lab 10/07/14 1606 10/08/14 0514  NA 142 137  K 3.8 4.1  CL 106 99  CO2 28 28  BUN 13 8  CREATININE 1.02 0.94  CALCIUM 9.2 9.2  GLUCOSE 104* 109*    CBG (last 3)  No results for input(s): GLUCAP in the last 72 hours.  Scheduled Meds: . enoxaparin (LOVENOX) injection  50 mg Subcutaneous Q24H  . fentaNYL  25 mcg Transdermal Q72H  . lactulose  20 g Oral TID  . polyethylene glycol  17 g Oral Daily    Continuous Infusions: . dextrose 5 % and 0.9% NaCl 100 mL/hr at 10/08/14 0103    Past Medical History  Diagnosis Date  . Cancer     colon    Past Surgical History  Procedure Laterality Date  . Abdominal surgery      Laurette Schimke MS, RD, LDN

## 2014-10-09 ENCOUNTER — Inpatient Hospital Stay (HOSPITAL_COMMUNITY): Payer: Medicare Other

## 2014-10-09 ENCOUNTER — Encounter (HOSPITAL_COMMUNITY): Payer: Self-pay | Admitting: Surgery

## 2014-10-09 DIAGNOSIS — D500 Iron deficiency anemia secondary to blood loss (chronic): Secondary | ICD-10-CM | POA: Insufficient documentation

## 2014-10-09 DIAGNOSIS — R109 Unspecified abdominal pain: Secondary | ICD-10-CM

## 2014-10-09 DIAGNOSIS — K5669 Other intestinal obstruction: Secondary | ICD-10-CM

## 2014-10-09 DIAGNOSIS — K669 Disorder of peritoneum, unspecified: Secondary | ICD-10-CM

## 2014-10-09 DIAGNOSIS — E611 Iron deficiency: Secondary | ICD-10-CM

## 2014-10-09 DIAGNOSIS — K56609 Unspecified intestinal obstruction, unspecified as to partial versus complete obstruction: Secondary | ICD-10-CM | POA: Insufficient documentation

## 2014-10-09 LAB — BASIC METABOLIC PANEL
Anion gap: 11 (ref 5–15)
BUN: 15 mg/dL (ref 6–23)
CO2: 26 mmol/L (ref 19–32)
Calcium: 9.5 mg/dL (ref 8.4–10.5)
Chloride: 103 mmol/L (ref 96–112)
Creatinine, Ser: 1.05 mg/dL (ref 0.50–1.35)
GFR calc non Af Amer: 85 mL/min — ABNORMAL LOW (ref >90)
GLUCOSE: 144 mg/dL — AB (ref 70–99)
Potassium: 4.5 mmol/L (ref 3.5–5.1)
SODIUM: 140 mmol/L (ref 135–145)

## 2014-10-09 LAB — CBC
HCT: 40.3 % (ref 39.0–52.0)
HEMOGLOBIN: 12.5 g/dL — AB (ref 13.0–17.0)
MCH: 24.9 pg — ABNORMAL LOW (ref 26.0–34.0)
MCHC: 31 g/dL (ref 30.0–36.0)
MCV: 80.3 fL (ref 78.0–100.0)
PLATELETS: 376 10*3/uL (ref 150–400)
RBC: 5.02 MIL/uL (ref 4.22–5.81)
RDW: 16.7 % — ABNORMAL HIGH (ref 11.5–15.5)
WBC: 7.9 10*3/uL (ref 4.0–10.5)

## 2014-10-09 MED ORDER — LIP MEDEX EX OINT
TOPICAL_OINTMENT | CUTANEOUS | Status: AC
  Filled 2014-10-09: qty 7

## 2014-10-09 MED ORDER — SODIUM CHLORIDE 0.9 % IV SOLN
1020.0000 mg | Freq: Once | INTRAVENOUS | Status: AC
  Administered 2014-10-09: 1020 mg via INTRAVENOUS
  Filled 2014-10-09: qty 34

## 2014-10-09 NOTE — Progress Notes (Signed)
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 - Abd x-ray shoed SBO, has episode of vomiting yesterday. - place NPO consult surgery, passing gas.  Normocytic anemia - Hematology gave IV iron.  SBO: - Started vomiting, abd x-ray shows high grade SBO. - NPO. Will consult surgery.  Code Status: full Family Communication: none  Disposition Plan: inpatient   Consultants:  Oncology  surgery  Procedures: - CT abd and pelvis 2.17.2016: Multiple peritoneal based soft tissue nodules and masses throughout the abdomen compatible with peritoneal recurrence of tumor, largest at the lateral RIGHT mid abdomen 8.1 x 10.4 x 18.2 cm.  Antibiotics:  None  HPI/Subjective: Was vomiting on 2.18.2016. Is passing gas.  Objective: Filed Vitals:   10/08/14 2220 10/09/14 0622 10/09/14 1027  BP: 123/75 122/74 128/89  Pulse: 92 87 103  Temp: 97.9 F (36.6 C) 98.5 F (36.9 C) 98.3 F (36.8 C)  TempSrc: Oral Oral Oral  Resp: 20 20 18   SpO2: 96% 97% 96%    Intake/Output Summary (Last 24 hours) at 10/09/14 1212 Last data filed at 10/09/14 1000  Gross per 24 hour  Intake 667.33 ml  Output    750 ml  Net -82.67 ml   There were no vitals filed for this visit.  Exam:  General: Alert, awake, oriented x3, in no acute distress.  HEENT: No bruits, no goiter.  Heart: Regular rate and rhythm. Lungs: Good air movement, clear Abdomen: Soft, diffuse tenderness, slightly distended positive bowel sounds. Neuro: Grossly intact, nonfocal.   Data Reviewed: Basic Metabolic Panel:  Recent Labs Lab 10/07/14 1606 10/08/14 0514 10/08/14 1700 10/09/14 0521  NA 142 137  --  140  K 3.8 4.1  --  4.5  CL 106 99  --  103  CO2 28 28  --  26  GLUCOSE 104* 109*  --  144*  BUN 13 8  --  15  CREATININE 1.02 0.94 0.95 1.05  CALCIUM 9.2 9.2  --  9.5   Liver  Function Tests:  Recent Labs Lab 10/07/14 1606 10/08/14 0514  AST 13 13  ALT 12 13  ALKPHOS 78 77  BILITOT 0.9 0.5  PROT 7.6 7.4  ALBUMIN 3.7 3.4*    Recent Labs Lab 10/07/14 1606  LIPASE 28   No results for input(s): AMMONIA in the last 168 hours. CBC:  Recent Labs Lab 10/07/14 1606 10/08/14 0514 10/09/14 0521  WBC 6.0 5.2 7.9  NEUTROABS  --  3.6  --   HGB 12.4* 12.0* 12.5*  HCT 39.2 39.0 40.3  MCV 80.2 80.2 80.3  PLT 310 315 376   Cardiac Enzymes: No results for input(s): CKTOTAL, CKMB, CKMBINDEX, TROPONINI in the last 168 hours. BNP (last 3 results) No results for input(s): BNP in the last 8760 hours.  ProBNP (last 3 results) No results for input(s): PROBNP in the last 8760 hours.  CBG: No results for input(s): GLUCAP in the last 168 hours.  No results found for this or any previous visit (from the past 240 hour(s)).   Studies: Ct Abdomen Pelvis W Contrast  10/07/2014   CLINICAL DATA:  Diffuse abdominal pain, nausea, history of carcinoma of the colon with tumor removal 3 years ago and chemotherapy  EXAM: CT ABDOMEN AND PELVIS WITH CONTRAST  TECHNIQUE: Multidetector CT imaging of the abdomen and pelvis was performed using the standard protocol following bolus administration of intravenous contrast. Sagittal  and coronal MPR images reconstructed from axial data set.  CONTRAST:  120mL OMNIPAQUE IOHEXOL 300 MG/ML  SOLN  COMPARISON:  None  FINDINGS: Bibasilar atelectasis.  Spleen, pancreas, kidneys, and adrenal glands normal appearance.  Scalloped subcapsular lesion lateral aspect RIGHT lobe liver 3.2 x 1.3 cm image 20.  Additional tiny nonspecific low-attenuation foci in liver.  Additional small cyst within RIGHT lobe 14 x 13 mm image 21.  No additional abnormalities of the liver or gallbladder.  Multiple intermediate to low-attenuation masses are seen within the peritoneal cavity.  Large bilobed abnormality in the lateral RIGHT mid abdomen, 8.1 x 10.4 cm in axial  dimensions image 38 extending 18.2 cm length.  Lesion in the LEFT mid abdomen 7.9 x 4.7 x 8.0 cm.  Lesions are most consistent with peritoneal tumor recurrence.  Multiple additional smaller peritoneal based nodules are seen at the anterior mid abdomen, LEFT mid abdomen, and pelvis.  No significant ascites, acute inflammatory process, or free intraperitoneal air.  Observed nodules abut multiple bowel loops without causing bowel obstruction or dilatation.  Prior RIGHT hemicolectomy with ileocolic anastomosis at mid transverse colon.  Scattered normal to mildly enlarged mesenteric lymph nodes.  RIGHT inguinal and small umbilical hernias containing fat.  Unremarkable bladder and ureters.  No acute osseous findings.  IMPRESSION: Prior RIGHT hemicolectomy with ileocolic anastomosis at mid transverse colon.  Multiple peritoneal based soft tissue nodules and masses throughout the abdomen compatible with peritoneal recurrence of tumor, largest at the lateral RIGHT mid abdomen 8.1 x 10.4 x 18.2 cm.  No definite evidence of bowel obstruction.   Electronically Signed   By: Lavonia Dana M.D.   On: 10/07/2014 19:03   Dg Abd 2 Views  10/09/2014   CLINICAL DATA:  Abdominal pain with no bowel movement. Evaluate for possible small bowel obstruction.  EXAM: ABDOMEN - 2 VIEW  COMPARISON:  Abdominal CT 08/26/2014 and 10/07/2014  FINDINGS: Previously administered oral contrast is visible within the remaining colon (status post right hemicolectomy). As such, dilated loops of bowel in the central abdomen, with fluid levels, consistent with markedly dilated small bowel. No indication of pneumatosis or perforation.  Bandlike opacities in the lower lungs consistent with atelectasis.  IMPRESSION: High-grade small bowel obstruction, new from 10/07/2014.   Electronically Signed   By: Monte Fantasia M.D.   On: 10/09/2014 09:37    Scheduled Meds: . dexamethasone  20 mg Intravenous Q12H  . feeding supplement (ENSURE)  1 Container Oral TID  BM  . fentaNYL  25 mcg Transdermal Q72H  . heparin  5,000 Units Subcutaneous 3 times per day  . pantoprazole (PROTONIX) IV  40 mg Intravenous Q12H  . polyethylene glycol  17 g Oral Daily   Continuous Infusions: . dextrose 5 % and 0.45% NaCl 50 mL/hr at 10/09/14 Dike, ABRAHAM  [facility] Pager [phone]. If 8PM-8AM, please contact night-coverage at [email or web address], password [facility] 10/09/2014, 12:12 PM  LOS: 1 day

## 2014-10-09 NOTE — Evaluation (Signed)
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  Clinical Impression  Patient evaluated by Physical Therapy with no further acute PT needs identified. All education has been completed and the patient has no further questions. Pt mobilizing well and no deficits identified.  Pt reports he is awaiting plan of care about surgery.  Encouraged pt to ambulate at least 2-3 times a day while in hospital. See below for any follow-up Physial Therapy or equipment needs. PT is signing off. Thank you for this referral.        Follow Up Recommendations No PT follow up    Equipment Recommendations  None recommended by PT    Recommendations for Other Services       Precautions / Restrictions Precautions Precautions: None Restrictions Weight Bearing Restrictions: No      Mobility  Bed Mobility Overal bed mobility: Modified Independent                Transfers Overall transfer level: Modified independent                  Ambulation/Gait Ambulation/Gait assistance: Modified independent (Device/Increase time) Ambulation Distance (Feet): 400 Feet Assistive device: None Gait Pattern/deviations: WFL(Within Functional Limits)     General Gait Details: no unsteadiness or LOB, denies any symptoms other then abdominal discomfort  Stairs            Wheelchair Mobility    Modified Rankin (Stroke Patients Only)       Balance Overall balance assessment: No apparent balance deficits (not formally assessed)                                           Pertinent Vitals/Pain Pain Assessment: 0-10 Pain Score: 4  Pain Location: abdomen Pain Descriptors / Indicators: Discomfort Pain Intervention(s): Monitored during session    Home  Living Family/patient expects to be discharged to:: Private residence Living Arrangements: Spouse/significant other             Home Equipment: None      Prior Function Level of Independence: Independent               Hand Dominance        Extremity/Trunk Assessment   Upper Extremity Assessment: Overall WFL for tasks assessed           Lower Extremity Assessment: Overall WFL for tasks assessed      Cervical / Trunk Assessment: Normal  Communication   Communication: No difficulties  Cognition Arousal/Alertness: Awake/alert Behavior During Therapy: WFL for tasks assessed/performed Overall Cognitive Status: Within Functional Limits for tasks assessed                      General Comments      Exercises        Assessment/Plan    PT Assessment Patent does not need any further PT services  PT Diagnosis     PT Problem List    PT Treatment Interventions     PT Goals (Current goals can be found in the Care Plan section) Acute Rehab PT Goals PT Goal Formulation: All assessment and education complete, DC therapy    Frequency  Barriers to discharge        Co-evaluation               End of Session   Activity Tolerance: Patient tolerated treatment well Patient left: in bed;with call bell/phone within reach;with family/visitor present           Time: 1027-1036 PT Time Calculation (min) (ACUTE ONLY): 9 min   Charges:   PT Evaluation $Initial PT Evaluation Tier I: 1 Procedure     PT G Codes:        William Laske,KATHrine E 10/09/2014, 10:41 AM Carmelia Bake, PT, DPT 10/09/2014 Pager: [phone]

## 2014-10-09 NOTE — Consult Note (Signed)
Reason for Consult:  Abdominal pain, colon cancer with intraperitoneal tumor. Referring Physician: Humbert Morozov is an 44 y.o. male.  HPI: 44 y/o admitted with colon mass in 05/2012, who underwent Diagnostic laparoscopy, exploratory laparotomy, lysis of adhesions, right hemicolectomy with ileotransverse colon anastomosis by Dr. Rosendo Gros 06/19/2012.   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    We saw him on 07/21/13 after a needle biopsy of a new abdominal mass with post procedure hemorrhage.  He was on Chemotherapy here and sent to [facility] for possible intraperitoneal therapy/debulking of the tumor. This was declined. Now he is back and admitted with abdominal pain,for about 5 days and little or no BM for 5 days.  No nausea or vomiting.  Pain is mostly in the lower quadrants.    CT scan on 10/07/14 shows his prior right hemicolectomy with ileocolic anastomosis at the mid transverse colon.  Multiple peritoneal based nodules and masses , the largest is 8.1x 10.4 x 18.2 cm, on the right;  but no evidence of obstruction  Film today shows high grade obstruction picture even though there is contrast in the colon.  No air or perforation.  He vomited about 750 ml yesterday after taking Miralax and before decadron injection yesterday. Labs are stable.  He is currently NPO.  We are ask to see.   Past Medical History  Diagnosis Date  T3pN2a(6/18LN) pMX. Adenocarcinoma of the colon   Intraperitoneal metastatic disease   Hx of tobacco use   Anemia   Family related stress 10/13 and 2015         Past Surgical History  Procedure Laterality Date  . Colonoscopy  06/17/2012    Procedure: COLONOSCOPY;  Surgeon: Juanita Craver, MD;  Location: [facility] END[facility]COPY;  Service: Endoscopy;  Laterality: N/A;  . Colon resection  06/19/2012    Procedure: COLON RESECTION LAPAROSCOPIC;  Surgeon: Ralene Ok, MD;  Location: [place];  Service: General;  Laterality: Right;  . Colostomy revision   06/19/2012    Procedure: COLON RESECTION RIGHT;  Surgeon: Ralene Ok, MD;  Location: [place];  Service: General;  Laterality: Right;    Family History  Problem Relation Age of Onset  . Alcohol abuse Father     Social History:  reports that he quit smoking about 14 months ago. His smoking use included Cigarettes. He started smoking about 17 years ago. He has a 7.5 pack-year smoking history. He has never used smokeless tobacco. He reports that he does not drink alcohol or use illicit drugs. ETOH: Social  DRugs:  None Tobacco:  Quit On disability, caring for kids at home. Allergies: No Known Allergies  Medications:  Prior to Admission medications   Medication Sig Start Date End Date Taking? Authorizing Provider  azithromycin (ZITHROMAX Z-PAK) 250 MG tablet Take as directed 09/23/14  Yes Volanda Napoleon, MD  DM-Doxylamine-Acetaminophen (NYQUIL COLD & FLU PO) Take 1 tablet by mouth 2 (two) times daily as needed (cold symptoms).    Yes Historical Provider, MD  ibuprofen (ADVIL,MOTRIN) 200 MG tablet Take 200 mg by mouth every 6 (six) hours as needed for moderate pain (pain).    Yes Historical Provider, MD  Trifluridine-Tipiracil (LONSURF PO) Take 80 mg by mouth every morning. Take 4 tablets (20 mg) by mouth daily for 14 days. Then STOP Medication for 14 Days. Then Repeat Again.   Yes Historical Provider, MD  lidocaine-prilocaine (EMLA) cream Apply topically as needed. Patient not taking: Reported on 10/08/2014 01/21/14   Volanda Napoleon,  MD  loperamide (IMODIUM A-D) 2 MG tablet Take 2 at onset of diarrhea, then 1 every 2hrs until 12hr without a BM. May take 2 tab every 4hrs at bedtime. If diarrhea recurs repeat. 11/12/13   Volanda Napoleon, MD  ondansetron (ZOFRAN) 8 MG tablet Take by mouth every 8 (eight) hours as needed for nausea or vomiting.    Historical Provider, MD  Pyridoxine HCl (VITAMIN B-6) 250 MG tablet Take 1 tablet (250 mg total) by mouth daily. 09/23/14   Volanda Napoleon, MD    Prior  to Admission:  Prescriptions prior to admission  Medication Sig Dispense Refill Last Dose  . azithromycin (ZITHROMAX Z-PAK) 250 MG tablet Take as directed 6 each 0 10/08/2014 at Unknown time  . DM-Doxylamine-Acetaminophen (NYQUIL COLD & FLU PO) Take 1 tablet by mouth 2 (two) times daily as needed (cold symptoms).    Past Week at Unknown time  . ibuprofen (ADVIL,MOTRIN) 200 MG tablet Take 200 mg by mouth every 6 (six) hours as needed for moderate pain (pain).    Past Week at Unknown time  . Trifluridine-Tipiracil (LONSURF PO) Take 80 mg by mouth every morning. Take 4 tablets (20 mg) by mouth daily for 14 days. Then STOP Medication for 14 Days. Then Repeat Again.   10/07/2014 at 0400  . lidocaine-prilocaine (EMLA) cream Apply topically as needed. (Patient not taking: Reported on 10/08/2014) 30 g 3 Not Taking at Unknown time  . loperamide (IMODIUM A-D) 2 MG tablet Take 2 at onset of diarrhea, then 1 every 2hrs until 12hr without a BM. May take 2 tab every 4hrs at bedtime. If diarrhea recurs repeat. 100 tablet 1 unknown at unkown time  . ondansetron (ZOFRAN) 8 MG tablet Take by mouth every 8 (eight) hours as needed for nausea or vomiting.   unknown at unknown time  . Pyridoxine HCl (VITAMIN B-6) 250 MG tablet Take 1 tablet (250 mg total) by mouth daily. 30 tablet 6    Scheduled: . dexamethasone  20 mg Intravenous Q12H  . feeding supplement (ENSURE)  1 Container Oral TID BM  . fentaNYL  25 mcg Transdermal Q72H  . heparin  5,000 Units Subcutaneous 3 times per day  . pantoprazole (PROTONIX) IV  40 mg Intravenous Q12H  . polyethylene glycol  17 g Oral Daily   Continuous: . dextrose 5 % and 0.45% NaCl 50 mL/hr at 10/09/14 0720   NUU:VOZDGUYQIHKVQ (DILAUDID) injection, lactulose, ondansetron, ondansetron **OR** ondansetron (ZOFRAN) IV Anti-infectives    None      Results for orders placed or performed during the hospital encounter of 10/08/14 (from the past 48 hour(s))  Creatinine, serum     Status:  None   Collection Time: 10/08/14  5:00 PM  Result Value Ref Range   Creatinine, Ser 0.95 0.50 - 1.35 mg/dL   GFR calc non Af Amer >90 >90 mL/min   GFR calc Af Amer >90 >90 mL/min    Comment: (NOTE) The eGFR has been calculated using the CKD EPI equation. This calculation has not been validated in all clinical situations. eGFR's persistently <90 mL/min signify possible Chronic Kidney Disease.   CBC     Status: Abnormal   Collection Time: 10/09/14  5:21 AM  Result Value Ref Range   WBC 7.9 4.0 - 10.5 K/uL   RBC 5.02 4.22 - 5.81 MIL/uL   Hemoglobin 12.5 (L) 13.0 - 17.0 g/dL   HCT 40.3 39.0 - 52.0 %   MCV 80.3 78.0 - 100.0 fL  MCH 24.9 (L) 26.0 - 34.0 pg   MCHC 31.0 30.0 - 36.0 g/dL   RDW 16.7 (H) 11.5 - 15.5 %   Platelets 376 150 - 400 K/uL  Basic metabolic panel     Status: Abnormal   Collection Time: 10/09/14  5:21 AM  Result Value Ref Range   Sodium 140 135 - 145 mmol/L   Potassium 4.5 3.5 - 5.1 mmol/L   Chloride 103 96 - 112 mmol/L   CO2 26 19 - 32 mmol/L   Glucose, Bld 144 (H) 70 - 99 mg/dL   BUN 15 6 - 23 mg/dL   Creatinine, Ser 1.05 0.50 - 1.35 mg/dL   Calcium 9.5 8.4 - 10.5 mg/dL   GFR calc non Af Amer 85 (L) >90 mL/min   GFR calc Af Amer >90 >90 mL/min    Comment: (NOTE) The eGFR has been calculated using the CKD EPI equation. This calculation has not been validated in all clinical situations. eGFR's persistently <90 mL/min signify possible Chronic Kidney Disease.    Anion gap 11 5 - 15    Ct Abdomen Pelvis W Contrast  10/07/2014   CLINICAL DATA:  Diffuse abdominal pain, nausea, history of carcinoma of the colon with tumor removal 3 years ago and chemotherapy  EXAM: CT ABDOMEN AND PELVIS WITH CONTRAST  TECHNIQUE: Multidetector CT imaging of the abdomen and pelvis was performed using the standard protocol following bolus administration of intravenous contrast. Sagittal and coronal MPR images reconstructed from axial data set.  CONTRAST:  140m OMNIPAQUE IOHEXOL  300 MG/ML  SOLN  COMPARISON:  None  FINDINGS: Bibasilar atelectasis.  Spleen, pancreas, kidneys, and adrenal glands normal appearance.  Scalloped subcapsular lesion lateral aspect RIGHT lobe liver 3.2 x 1.3 cm image 20.  Additional tiny nonspecific low-attenuation foci in liver.  Additional small cyst within RIGHT lobe 14 x 13 mm image 21.  No additional abnormalities of the liver or gallbladder.  Multiple intermediate to low-attenuation masses are seen within the peritoneal cavity.  Large bilobed abnormality in the lateral RIGHT mid abdomen, 8.1 x 10.4 cm in axial dimensions image 38 extending 18.2 cm length.  Lesion in the LEFT mid abdomen 7.9 x 4.7 x 8.0 cm.  Lesions are most consistent with peritoneal tumor recurrence.  Multiple additional smaller peritoneal based nodules are seen at the anterior mid abdomen, LEFT mid abdomen, and pelvis.  No significant ascites, acute inflammatory process, or free intraperitoneal air.  Observed nodules abut multiple bowel loops without causing bowel obstruction or dilatation.  Prior RIGHT hemicolectomy with ileocolic anastomosis at mid transverse colon.  Scattered normal to mildly enlarged mesenteric lymph nodes.  RIGHT inguinal and small umbilical hernias containing fat.  Unremarkable bladder and ureters.  No acute osseous findings.  IMPRESSION: Prior RIGHT hemicolectomy with ileocolic anastomosis at mid transverse colon.  Multiple peritoneal based soft tissue nodules and masses throughout the abdomen compatible with peritoneal recurrence of tumor, largest at the lateral RIGHT mid abdomen 8.1 x 10.4 x 18.2 cm.  No definite evidence of bowel obstruction.   Electronically Signed   By: MLavonia DanaM.D.   On: 10/07/2014 19:03   Dg Abd 2 Views  10/09/2014   CLINICAL DATA:  Abdominal pain with no bowel movement. Evaluate for possible small bowel obstruction.  EXAM: ABDOMEN - 2 VIEW  COMPARISON:  Abdominal CT 08/26/2014 and 10/07/2014  FINDINGS: Previously administered oral  contrast is visible within the remaining colon (status post right hemicolectomy). As such, dilated loops of bowel in the  central abdomen, with fluid levels, consistent with markedly dilated small bowel. No indication of pneumatosis or perforation.  Bandlike opacities in the lower lungs consistent with atelectasis.  IMPRESSION: High-grade small bowel obstruction, new from 10/07/2014.   Electronically Signed   By: Monte Fantasia M.D.   On: 10/09/2014 09:37    Review of Systems  Constitutional: Negative.   HENT: Negative.   Eyes: Negative.   Respiratory:       Some orthopnea, now with abdominal pain.  Cardiovascular: Negative.   Gastrointestinal: Positive for heartburn, nausea (yesterday only), vomiting (yesterday only), abdominal pain (it started about 5 days ago, as did his constipation) and constipation. Negative for diarrhea, blood in stool and melena.  Genitourinary: Negative.   Musculoskeletal: Negative.   Skin: Negative.   Neurological: Negative.   Endo/Heme/Allergies: Negative.   Psychiatric/Behavioral: Positive for depression. The patient is nervous/anxious.    Blood pressure 128/89, pulse 103, temperature 98.3 F (36.8 C), temperature source Oral, resp. rate 18, SpO2 96 %. Physical Exam  Constitutional: He is oriented to person, place, and time. He appears well-developed and well-nourished.  Overweight on exam..  HENT:  Head: Normocephalic and atraumatic.  Nose: Nose normal.  Eyes: Conjunctivae and EOM are normal. Pupils are equal, round, and reactive to light. Right eye exhibits no discharge. Left eye exhibits no discharge. No scleral icterus.  Neck: Normal range of motion. Neck supple. No JVD present. No tracheal deviation present. No thyromegaly present.  Cardiovascular: Regular rhythm, normal heart sounds and intact distal pulses.   No murmur heard. Tachycardic   Respiratory: Effort normal. No respiratory distress. He has no wheezes. He has rales (some in the bases.). He  exhibits no tenderness.  GI: Soft. He exhibits distension (large abdomen, mildly distended.) and mass (I cannot really feel the tumor.). There is tenderness (he is sore all over, but biggest complaint is on right side of abdomen.). There is no rebound and no guarding.  Musculoskeletal: He exhibits no edema.  Lymphadenopathy:    He has no cervical adenopathy.  Neurological: He is alert and oriented to person, place, and time. No cranial nerve deficit.  Skin: Skin is warm and dry. No rash noted. No erythema. No pallor.  Psychiatric: He has a normal mood and affect. His behavior is normal. Judgment and thought content normal.  He is tearful, and says he's confused with what everyone is telling him.  He was very shocked when I showed him the size of the tumors, in real measurements.      Assessment/Plan: 1.  SBO with Adenocarcinoma of the colon (T3pN2a(6/18)PMX in 05/2012  Followed by Dr. Marin Olp for oncology. 2.  Intraperitoneal metastasis   This is a significant tumor burden. 3.  Hx of Tobacco/ETOH use 4.  Anemia 5.  Family related stress 6.  Heparin/SCD for DVT prophylaxis.   Plan:  Treat him as a SBO for now.  No nausea today so I would just make him NPO except for ice chips.  Increase his IV fluids, if he has more nausea insert NG and place on LIWS.  He had a long discussion with both myself and Dr. Lucia Gaskins. We explained we could not remove the tumor as discussed at [facility].  We could fix a small obstruction or at least attempt it if that were necessary.    First step is to place on bowel rest and see if it clears on it's own.  He does have contrast in the left colon from CT on 2/17,  I  will increase his fluids, recheck labs in AM and recheck film.  He want to talk more with Dr.Enever.  I recommended he try calling his office now.  JENNINGS,WILLARD 10/09/2014, 12:36 PM   Agree with above. He has a large tumor burden of recurrence.  He is married.  His sister, Freda Munro, was in the room. I  spent about 20 minutes going over the finding.  The patient shows a lack of insight into the advanced stage of his colon cancer. I spoke to Dr. Arelia Sneddon about his case. Will follow.  Alphonsa Overall, MD, [facility] Pager: [phone] Office phone:  [phone]

## 2014-10-09 NOTE — Progress Notes (Signed)
Mr. Coviello looks a lot better this morning. He slept all night. He is not complaining of any pain. I will like to think that the Duragesic patch might be helping. I do think that the Decadron that we are giving him is also helping.  He has not yet had his abdominal flat and upright x-ray. This will be important.  He is hungry. He's had I think some small bowel movement was but really nothing substantial.  He did have an episode of bilious emesis. He said this happened after he got Decadron. He is on IV Protonix.  He is iron deficient. He is not anemic. I will give him a dose of IV iron.  He's had no fever. His labs look pretty good. Hemoglobin is 12.5.  On physical exam, his vital signs are stable. Temperature 98.5. Blood pressure 120/74. Pulse is 87. Abdomen is slightly distended. He does have decent bowel sounds. There is no guarding or rebound tenderness. He may have some slight tenderness in the right lower quadrant. Again abdomen is not as full. There is no obvious of abdominal mass. Extremities shows no clubbing, cyanosis or edema. Lungs are clear. Cardiac exam regular rate and rhythm.  We will see what his abdominal x-ray shows. Hopefully there is no obstruction.  If he does not have obstruction, I would advance his diet.  I will call out to [facility]. He has seen Dr.Shen in the past. It has been over a year. I just wonder if there is not a role for surgery. I would like to think that since his disease really is peritoneal, that he might be a candidate for debulking surgery and possible intraperitoneal chemotherapy.  I realize this is very aggressive but he is young and he is healthy.  I appreciate the great care that he is getting. I will continue to pray for him.  Harriette Ohara 33:3

## 2014-10-10 LAB — CBC
HCT: 38.4 % — ABNORMAL LOW (ref 39.0–52.0)
Hemoglobin: 12.3 g/dL — ABNORMAL LOW (ref 13.0–17.0)
MCH: 25.5 pg — ABNORMAL LOW (ref 26.0–34.0)
MCHC: 32 g/dL (ref 30.0–36.0)
MCV: 79.5 fL (ref 78.0–100.0)
PLATELETS: 353 10*3/uL (ref 150–400)
RBC: 4.83 MIL/uL (ref 4.22–5.81)
RDW: 16.5 % — AB (ref 11.5–15.5)
WBC: 16.9 10*3/uL — AB (ref 4.0–10.5)

## 2014-10-10 LAB — BASIC METABOLIC PANEL
Anion gap: 10 (ref 5–15)
BUN: 13 mg/dL (ref 6–23)
CO2: 27 mmol/L (ref 19–32)
Calcium: 9.5 mg/dL (ref 8.4–10.5)
Chloride: 100 mmol/L (ref 96–112)
Creatinine, Ser: 1.03 mg/dL (ref 0.50–1.35)
GFR, EST NON AFRICAN AMERICAN: 87 mL/min — AB (ref >90)
GLUCOSE: 148 mg/dL — AB (ref 70–99)
Potassium: 3.9 mmol/L (ref 3.5–5.1)
Sodium: 137 mmol/L (ref 135–145)

## 2014-10-10 LAB — PREALBUMIN: Prealbumin: 7.6 mg/dL — ABNORMAL LOW (ref 17.0–34.0)

## 2014-10-10 NOTE — Progress Notes (Signed)
I appreciate the consultation from Dr. Lucia Gaskins of general surgery.  Mr. Blissett abdominal film yesterday shows a high-grade small bowel obstruction. He is surprising, not that symptomatic with it. He did have a bowel movement yesterday.  Not having any abdominal pain. Some of this is might be secondary to the pain medication and the steroid that I have him on.  He is taking some ice chips right now. It would be nice to try him on some clear liquids.  I probably would repeat another x-ray on him tomorrow.  I think that if he needs surgery, I will certainly would agree with this. I realize that he cannot be totally to both but I think if he can at least have the gross tumor volume removed, I think this would definitely help him.  He is trying his best to understand what is going on. I have been talking to him about all this at length. Because he has felt so well in the past, he really has been reluctant to do any aggressive interventions. However, now, I think he appreciates greater this seriousness of his problem.  He did get IV iron yesterday. I think this will help him.  I will A prealbumin on him. This might help [place] see what kind of reserve he has.  On his physical exam, his vital signs are all stable. His blood pressure is 131/84. His temperature 97.7. His lungs are clear. Cardiac exam regular rate and rhythm area and abdomen is slightly distended. Abdomen is soft. He has no guarding or rebound tenderness. Bowel sounds are slightly decreased. Extremities shows no clubbing, cyanosis or edema.  I do suspect that we probably are looking at surgical intervention for him. I really think that this would be helpful. I think would help his quality of life so he would be able to eat. I know that it is not curative by any means but yet I think it could have a significant impact on his quality of life.  I appreciate all the great care that he is getting from everybody on [address].  Pete E.  Isaiah  40:31

## 2014-10-10 NOTE — Progress Notes (Signed)
Patient ID: David Conrad, male   DOB: 04-11-1971, 44 y.o.   MRN: [id number]  [facility]  Subjective: Patient up in chair, ambulating in halls.  Passing large amounts of flatus, semi-formed BM last night.  Objective: Vital signs in last 24 hours: Temp:  [97.7 F (36.5 C)-98.3 F (36.8 C)] 97.7 F (36.5 C) (02/20 0530) Pulse Rate:  [82-103] 84 (02/20 0530) Resp:  [18-20] 20 (02/20 0530) BP: (109-131)/(72-89) 131/84 mmHg (02/20 0530) SpO2:  [93 %-96 %] 94 % (02/20 0530) Weight:  [221 lb 1.9 oz (100.3 kg)] 221 lb 1.9 oz (100.3 kg) (02/19 1900) Last BM Date: 10/09/14  Intake/Output from previous day: 02/19 0701 - 02/20 0700 In: 2184 [P.O.:240; I.V.:1810; IV Piggyback:134] Out: 255 [Urine:255] Intake/Output this shift:    Physical Exam: HEENT - sclerae clear, mucous membranes moist Neck - soft Chest - clear bilaterally Cor - RRR Abdomen - moderate distension; BS present; minimal tenderness Ext - no edema, non-tender Neuro - alert & oriented, no focal deficits  Lab Results:   Recent Labs  10/09/14 0521 10/10/14 0500  WBC 7.9 16.9*  HGB 12.5* 12.3*  HCT 40.3 38.4*  PLT 376 353   BMET  Recent Labs  10/09/14 0521 10/10/14 0500  NA 140 137  K 4.5 3.9  CL 103 100  CO2 26 27  GLUCOSE 144* 148*  BUN 15 13  CREATININE 1.05 1.03  CALCIUM 9.5 9.5   PT/INR No results for input(s): LABPROT, INR in the last 72 hours. Comprehensive Metabolic Panel:    Component Value Date/Time   NA 137 10/10/2014 0500   NA 140 10/09/2014 0521   NA 140 09/23/2014 1156   NA 141 09/02/2014 0854   K 3.9 10/10/2014 0500   K 4.5 10/09/2014 0521   K 3.8 09/23/2014 1156   K 3.7 09/02/2014 0854   CL 100 10/10/2014 0500   CL 103 10/09/2014 0521   CL 105 09/23/2014 1156   CL 101 09/02/2014 0854   CO2 27 10/10/2014 0500   CO2 26 10/09/2014 0521   CO2 27 09/23/2014 1156   CO2 28 09/02/2014 0854   BUN 13 10/10/2014 0500   BUN 15 10/09/2014  0521   BUN 10 09/23/2014 1156   BUN 15 09/02/2014 0854   CREATININE 1.03 10/10/2014 0500   CREATININE 1.05 10/09/2014 0521   CREATININE 1.2 09/23/2014 1156   CREATININE 1.2 09/02/2014 0854   GLUCOSE 148* 10/10/2014 0500   GLUCOSE 144* 10/09/2014 0521   GLUCOSE 111 09/23/2014 1156   GLUCOSE 113 09/02/2014 0854   CALCIUM 9.5 10/10/2014 0500   CALCIUM 9.5 10/09/2014 0521   CALCIUM 9.4 09/23/2014 1156   CALCIUM 9.5 09/02/2014 0854   AST 13 10/08/2014 0514   AST 13 10/07/2014 1606   AST 13 09/23/2014 1156   AST 18 09/02/2014 0854   ALT 13 10/08/2014 0514   ALT 12 10/07/2014 1606   ALT 12 09/23/2014 1156   ALT 10 09/02/2014 0854   ALKPHOS 77 10/08/2014 0514   ALKPHOS 78 10/07/2014 1606   ALKPHOS 76 09/23/2014 1156   ALKPHOS 80 09/02/2014 0854   BILITOT 0.5 10/08/2014 0514   BILITOT 0.9 10/07/2014 1606   BILITOT 0.60 09/23/2014 1156   BILITOT 0.50 09/02/2014 0854   PROT 7.4 10/08/2014 0514   PROT 7.6 10/07/2014 1606   PROT 7.4 09/23/2014 1156   PROT 7.7 09/02/2014 0854   ALBUMIN 3.4* 10/08/2014 0514   ALBUMIN 3.7 10/07/2014 1606  Studies/Results: Dg Abd 2 Views  10-21-2014   CLINICAL DATA:  Abdominal pain with no bowel movement. Evaluate for possible small bowel obstruction.  EXAM: ABDOMEN - 2 VIEW  COMPARISON:  Abdominal CT 08/26/2014 and 10/07/2014  FINDINGS: Previously administered oral contrast is visible within the remaining colon (status post right hemicolectomy). As such, dilated loops of bowel in the central abdomen, with fluid levels, consistent with markedly dilated small bowel. No indication of pneumatosis or perforation.  Bandlike opacities in the lower lungs consistent with atelectasis.  IMPRESSION: High-grade small bowel obstruction, new from 10/07/2014.   Electronically Signed   By: Monte Fantasia M.D.   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 Office: [place] 10/10/2014

## 2014-10-10 NOTE — Progress Notes (Signed)
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 - NPO consult surgery, passing gas.  Normocytic anemia - Hematology gave IV iron.  SBO: - Started vomiting, abd x-ray shows high grade SBO. - NPO. Will consult surgery.  Code Status: full Family Communication: none  Disposition Plan: inpatient   Consultants:  Oncology  surgery  Procedures: - CT abd and pelvis 2.17.2016: Multiple peritoneal based soft tissue nodules and masses throughout the abdomen compatible with peritoneal recurrence of tumor, largest at the lateral RIGHT mid abdomen 8.1 x 10.4 x 18.2 cm.  Antibiotics:  None  HPI/Subjective: No further vomiting.  Objective: Filed Vitals:   10/09/14 1700 10/09/14 1900 10/09/14 2200 10/10/14 0530  BP: 117/76  109/72 131/84  Pulse: 82  89 84  Temp:   97.9 F (36.6 C) 97.7 F (36.5 C)  TempSrc:   Oral Oral  Resp: 20  20 20   Height:  5\' 7"  (1.702 m)    Weight:  100.3 kg (221 lb 1.9 oz)    SpO2: 93%  94% 94%    Intake/Output Summary (Last 24 hours) at 10/10/14 0946 Last data filed at 10/10/14 7342  Gross per 24 hour  Intake 2183.99 ml  Output    255 ml  Net 1928.99 ml   Filed Weights   10/09/14 1900  Weight: 100.3 kg (221 lb 1.9 oz)    Exam:  General: Alert, awake, oriented x3, in no acute distress.  HEENT: No bruits, no goiter.  Heart: Regular rate and rhythm. Lungs: Good air movement, clear Abdomen: Soft, diffuse tenderness, slightly distended positive bowel sounds. Neuro: Grossly intact, nonfocal.   Data Reviewed: Basic Metabolic Panel:  Recent Labs Lab 10/07/14 1606 10/08/14 0514 10/08/14 1700 10/09/14 0521 10/10/14 0500  NA 142 137  --  140 137  K 3.8 4.1  --  4.5 3.9  CL 106 99  --  103 100  CO2 28 28  --  26 27  GLUCOSE 104* 109*  --  144* 148*  BUN 13 8  --  15 13  CREATININE 1.02 0.94 0.95  1.05 1.03  CALCIUM 9.2 9.2  --  9.5 9.5   Liver Function Tests:  Recent Labs Lab 10/07/14 1606 10/08/14 0514  AST 13 13  ALT 12 13  ALKPHOS 78 77  BILITOT 0.9 0.5  PROT 7.6 7.4  ALBUMIN 3.7 3.4*    Recent Labs Lab 10/07/14 1606  LIPASE 28   No results for input(s): AMMONIA in the last 168 hours. CBC:  Recent Labs Lab 10/07/14 1606 10/08/14 0514 10/09/14 0521 10/10/14 0500  WBC 6.0 5.2 7.9 16.9*  NEUTROABS  --  3.6  --   --   HGB 12.4* 12.0* 12.5* 12.3*  HCT 39.2 39.0 40.3 38.4*  MCV 80.2 80.2 80.3 79.5  PLT 310 315 376 353   Cardiac Enzymes: No results for input(s): CKTOTAL, CKMB, CKMBINDEX, TROPONINI in the last 168 hours. BNP (last 3 results) No results for input(s): BNP in the last 8760 hours.  ProBNP (last 3 results) No results for input(s): PROBNP in the last 8760 hours.  CBG: No results for input(s): GLUCAP in the last 168 hours.  No results found for this or any previous visit (from the past 240 hour(s)).   Studies: Dg Abd 2 Views  10/09/2014   CLINICAL DATA:  Abdominal pain with no bowel movement. Evaluate for possible small bowel obstruction.  EXAM: ABDOMEN - 2 VIEW  COMPARISON:  Abdominal CT 08/26/2014 and 10/07/2014  FINDINGS: Previously administered oral contrast is visible within the remaining colon (status post right hemicolectomy). As such, dilated loops of bowel in the central abdomen, with fluid levels, consistent with markedly dilated small bowel. No indication of pneumatosis or perforation.  Bandlike opacities in the lower lungs consistent with atelectasis.  IMPRESSION: High-grade small bowel obstruction, new from 10/07/2014.   Electronically Signed   By: Monte Fantasia M.D.   On: 10/09/2014 09:37    Scheduled Meds: . dexamethasone  20 mg Intravenous Q12H  . feeding supplement (ENSURE)  1 Container Oral TID BM  . fentaNYL  25 mcg Transdermal Q72H  . heparin  5,000 Units Subcutaneous 3 times per day  . pantoprazole (PROTONIX) IV  40 mg  Intravenous Q12H  . polyethylene glycol  17 g Oral Daily   Continuous Infusions: . dextrose 5 % and 0.45% NaCl 100 mL/hr (10/10/14 0746)     Charlynne Cousins  [facility] Pager [phone]. If 8PM-8AM, please contact night-coverage at [email or web address], password [facility] 10/10/2014, 9:46 AM  LOS: 2 days

## 2014-10-11 ENCOUNTER — Inpatient Hospital Stay (HOSPITAL_COMMUNITY): Payer: Medicare Other

## 2014-10-11 DIAGNOSIS — K5660 Unspecified intestinal obstruction: Secondary | ICD-10-CM

## 2014-10-11 DIAGNOSIS — C182 Malignant neoplasm of ascending colon: Secondary | ICD-10-CM

## 2014-10-11 LAB — PREALBUMIN: Prealbumin: 11.6 mg/dL — ABNORMAL LOW (ref 17.0–34.0)

## 2014-10-11 LAB — COMPREHENSIVE METABOLIC PANEL
ALK PHOS: 65 U/L (ref 39–117)
ALT: 10 U/L (ref 0–53)
AST: 16 U/L (ref 0–37)
Albumin: 3.1 g/dL — ABNORMAL LOW (ref 3.5–5.2)
Anion gap: 11 (ref 5–15)
BUN: 14 mg/dL (ref 6–23)
CHLORIDE: 101 mmol/L (ref 96–112)
CO2: 28 mmol/L (ref 19–32)
Calcium: 9.5 mg/dL (ref 8.4–10.5)
Creatinine, Ser: 0.92 mg/dL (ref 0.50–1.35)
GFR calc Af Amer: >90 mL/min (ref >90)
GLUCOSE: 157 mg/dL — AB (ref 70–99)
Potassium: 3.9 mmol/L (ref 3.5–5.1)
SODIUM: 140 mmol/L (ref 135–145)
TOTAL PROTEIN: 7.2 g/dL (ref 6.0–8.3)
Total Bilirubin: 0.7 mg/dL (ref 0.3–1.2)

## 2014-10-11 LAB — CBC
HEMATOCRIT: 38.3 % — AB (ref 39.0–52.0)
Hemoglobin: 12.1 g/dL — ABNORMAL LOW (ref 13.0–17.0)
MCH: 25.3 pg — ABNORMAL LOW (ref 26.0–34.0)
MCHC: 31.6 g/dL (ref 30.0–36.0)
MCV: 80.1 fL (ref 78.0–100.0)
Platelets: 370 10*3/uL (ref 150–400)
RBC: 4.78 MIL/uL (ref 4.22–5.81)
RDW: 16.8 % — ABNORMAL HIGH (ref 11.5–15.5)
WBC: 14.4 10*3/uL — ABNORMAL HIGH (ref 4.0–10.5)

## 2014-10-11 MED ORDER — LIDOCAINE-PRILOCAINE 2.5-2.5 % EX CREA
TOPICAL_CREAM | Freq: Once | CUTANEOUS | Status: AC
  Administered 2014-10-12: 09:00:00 via TOPICAL
  Filled 2014-10-11: qty 5

## 2014-10-11 NOTE — Progress Notes (Signed)
Patient ID: David Conrad, male   DOB: [birth year], 44 y.o.   MRN: [id number]  [facility]  Subjective: Patient back from radiology.  Discussed AXR findings with him.  Tolerating clear liquids.  BM's yesterday.  No pain.  Wants to eat.  Objective: Vital signs in last 24 hours: Temp:  [97.9 F (36.6 C)-98.2 F (36.8 C)] 98 F (36.7 C) 11-10-22 0609) Pulse Rate:  [77-85] 78 11-10-2022 0609) Resp:  [18] 18 Nov 10, 2022 0609) BP: (119-140)/(78-79) 128/78 mmHg Nov 10, 2022 0609) SpO2:  [93 %-98 %] 98 % 11/10/22 0609) Last BM Date: 10/10/14  Intake/Output from previous day: 02/20 0701 - 11-10-2022 0700 In: 3060 [P.O.:660; I.V.:2400] Out: 600 [Urine:600] Intake/Output this shift:    Physical Exam: HEENT - sclerae clear, mucous membranes moist Neck - soft Chest - clear bilaterally Cor - RRR Abdomen - soft, mild distension; BS present; non-tender Ext - no edema, non-tender Neuro - alert & oriented, no focal deficits  Lab Results:   Recent Labs  10/10/14 0500 Nov 10, 2014 0500  WBC 16.9* 14.4*  HGB 12.3* 12.1*  HCT 38.4* 38.3*  PLT 353 370   BMET  Recent Labs  10/10/14 0500 11-10-14 0500  NA 137 140  K 3.9 3.9  CL 100 101  CO2 27 28  GLUCOSE 148* 157*  BUN 13 14  CREATININE 1.03 0.92  CALCIUM 9.5 9.5   PT/INR No results for input(s): LABPROT, INR in the last 72 hours. Comprehensive Metabolic Panel:    Component Value Date/Time   NA 140 11/10/2014 0500   NA 137 10/10/2014 0500   NA 140 09/23/2014 1156   NA 141 09/02/2014 0854   K 3.9 10-Nov-2014 0500   K 3.9 10/10/2014 0500   K 3.8 09/23/2014 1156   K 3.7 09/02/2014 0854   CL 101 11-10-14 0500   CL 100 10/10/2014 0500   CL 105 09/23/2014 1156   CL 101 09/02/2014 0854   CO2 28 10-Nov-2014 0500   CO2 27 10/10/2014 0500   CO2 27 09/23/2014 1156   CO2 28 09/02/2014 0854   BUN 14 11/10/2014 0500   BUN 13 10/10/2014 0500   BUN 10 09/23/2014 1156   BUN 15 09/02/2014 0854   CREATININE 0.92  2014-11-10 0500   CREATININE 1.03 10/10/2014 0500   CREATININE 1.2 09/23/2014 1156   CREATININE 1.2 09/02/2014 0854   GLUCOSE 157* 11-10-14 0500   GLUCOSE 148* 10/10/2014 0500   GLUCOSE 111 09/23/2014 1156   GLUCOSE 113 09/02/2014 0854   CALCIUM 9.5 11/10/2014 0500   CALCIUM 9.5 10/10/2014 0500   CALCIUM 9.4 09/23/2014 1156   CALCIUM 9.5 09/02/2014 0854   AST 16 November 10, 2014 0500   AST 13 10/08/2014 0514   AST 13 09/23/2014 1156   AST 18 09/02/2014 0854   ALT 10 November 10, 2014 0500   ALT 13 10/08/2014 0514   ALT 12 09/23/2014 1156   ALT 10 09/02/2014 0854   ALKPHOS 65 2014/11/10 0500   ALKPHOS 77 10/08/2014 0514   ALKPHOS 76 09/23/2014 1156   ALKPHOS 80 09/02/2014 0854   BILITOT 0.7 11/10/2014 0500   BILITOT 0.5 10/08/2014 0514   BILITOT 0.60 09/23/2014 1156   BILITOT 0.50 09/02/2014 0854   PROT 7.2 11/10/14 0500   PROT 7.4 10/08/2014 0514   PROT 7.4 09/23/2014 1156   PROT 7.7 09/02/2014 0854   ALBUMIN 3.1* 11/10/2014 0500   ALBUMIN 3.4* 10/08/2014 0514    Studies/Results: Dg Abd 2 Views  11/10/14   CLINICAL  DATA:  Abdominal distention, small bowel obstruction, colon cancer  EXAM: ABDOMEN - 2 VIEW  COMPARISON:  10/09/2014  FINDINGS: Bibasilar atelectasis.  Multiple dilated loops of small bowel in the mid upper and mid abdomen with relative paucity of colonic gas consistent with small bowel obstruction.  Clearance of contrast previously seen within the distal colon.  No definite bowel wall thickening or free intraperitoneal air.  Bones unremarkable and no definite urinary tract calcification is seen.  IMPRESSION: Persistent small bowel obstruction.   Electronically Signed   By: Lavonia Dana M.D.   On: 10/11/2014 07:55    Anti-infectives: Anti-infectives    None      Assessment & Plans: Partial SBO due to recurrent adenocarcinoma of colon Clear liquid diet - continue today Encouraged ambulation, OOB Plan to repeat AXR in AM  2/22 Discussed possible surgical intervention early next week if not continued improvement; patient discussed referral back to [facility] Dr. Bridgett Larsson with medical oncology (Dr. Jana Hakim) today; will ask Dr. Marin Olp and Dr. Zella Richer to discuss options with patient tomorrow - i.e. Referral to [facility] this week versus laparotomy for lysis of adhesions, possible resection or bypass here at [facility] versus continued non-op management.  Earnstine Regal, MD, [facility] Office: [place] 10/11/2014

## 2014-10-11 NOTE — Progress Notes (Signed)
David Conrad   DOB:05-04-1971   ZO#:[id number]   JWJ#:[id number]  Subjective: just retirned from [facility] with results pending; reports 2 small BMs in past 24 hrs, no blood, normal consistency; pain well controlled; walking "15 times around" yesterday. No family in room   Objective: middle aged [place] American man examined in recliner Filed Vitals:   10/11/14 0609  BP: 128/78  Pulse: 78  Temp: 98 F (36.7 C)  Resp: 18    Body mass index is 34.62 kg/(m^2).  Intake/Output Summary (Last 24 hours) at 10/11/14 0815 Last data filed at 10/11/14 0600  Gross per 24 hour  Intake   3060 ml  Output    600 ml  Net   2460 ml     Lungs clear -- no rales or rhonchi  Heart regular rate and rhythm  Abdomen soft, no BS heard  MSK no focal spinal tenderness  Neuro nonfocal   CBG (last 3)  No results for input(s): GLUCAP in the last 72 hours.   Labs:  Lab Results  Component Value Date   WBC 14.4* 10/11/2014   HGB 12.1* 10/11/2014   HCT 38.3* 10/11/2014   MCV 80.1 10/11/2014   PLT 370 10/11/2014   NEUTROABS 3.6 10/08/2014    _0 @  Urine Studies No results for input(s): UHGB, CRYS in the last 72 hours.  Invalid input(s): UACOL, UAPR, USPG, UPH, UTP, UGL, UKET, UBIL, UNIT, UROB, ULEU, UEPI, UWBC, URBC, UBAC, CAST, [place], [place]  Basic Metabolic Panel:  Recent Labs Lab 10/07/14 1606 10/08/14 0514 10/08/14 1700 10/09/14 0521 10/10/14 0500 10/11/14 0500  NA 142 137  --  140 137 140  K 3.8 4.1  --  4.5 3.9 3.9  CL 106 99  --  103 100 101  CO2 28 28  --  _1 GLUCOSE 104* 109*  --  144* 148* 157*  BUN 13 8  --  _2 CREATININE 1.02 0.94 0.95 1.05 1.03 0.92  CALCIUM 9.2 9.2  --  9.5 9.5 9.5   GFR Estimated Creatinine Clearance: 116.9 mL/min (by C-G formula based on Cr of 0.92). Liver Function Tests:  Recent Labs Lab 10/07/14 1606 10/08/14 0514 10/11/14 0500  AST _3 ALT _4 ALKPHOS 78 77 65  BILITOT 0.9 0.5 0.7  PROT 7.6 7.4 7.2   ALBUMIN 3.7 3.4* 3.1*    Recent Labs Lab 10/07/14 1606  LIPASE 28   No results for input(s): AMMONIA in the last 168 hours. Coagulation profile No results for input(s): INR, PROTIME in the last 168 hours.  CBC:  Recent Labs Lab 10/07/14 1606 10/08/14 0514 10/09/14 0521 10/10/14 0500 10/11/14 0500  WBC 6.0 5.2 7.9 16.9* 14.4*  NEUTROABS  --  3.6  --   --   --   HGB 12.4* 12.0* 12.5* 12.3* 12.1*  HCT 39.2 39.0 40.3 38.4* 38.3*  MCV 80.2 80.2 80.3 79.5 80.1  PLT 310 315 376 353 370   Cardiac Enzymes: No results for input(s): CKTOTAL, CKMB, CKMBINDEX, TROPONINI in the last 168 hours. BNP: Invalid input(s): POCBNP CBG: No results for input(s): GLUCAP in the last 168 hours. D-Dimer No results for input(s): DDIMER in the last 72 hours. Hgb A1c No results for input(s): HGBA1C in the last 72 hours. Lipid Profile No results for input(s): CHOL, HDL, LDLCALC, TRIG, CHOLHDL, LDLDIRECT in the last 72 hours. Thyroid function studies No results for input(s): TSH, T4TOTAL, T3FREE, THYROIDAB in the last 72 hours.  Invalid input(s): FREET3 Anemia work up  Recent Labs  10/08/14 0918  VITAMINB12 487  FOLATE 12.1  FERRITIN 109  TIBC 234  IRON 14*  RETICCTPCT 0.9   Microbiology No results found for this or any previous visit (from the past 240 hour(s)).    Studies:  Dg Abd 2 Views  10/11/2014   CLINICAL DATA:  Abdominal distention, small bowel obstruction, colon cancer  EXAM: ABDOMEN - 2 VIEW  COMPARISON:  10/09/2014  FINDINGS: Bibasilar atelectasis.  Multiple dilated loops of small bowel in the mid upper and mid abdomen with relative paucity of colonic gas consistent with small bowel obstruction.  Clearance of contrast previously seen within the distal colon.  No definite bowel wall thickening or free intraperitoneal air.  Bones unremarkable and no definite urinary tract calcification is seen.  IMPRESSION: Persistent small bowel obstruction.   Electronically Signed   By:  Lavonia Dana M.D.   On: 10/11/2014 07:55   Dg Abd 2 Views  10/09/2014   CLINICAL DATA:  Abdominal pain with no bowel movement. Evaluate for possible small bowel obstruction.  EXAM: ABDOMEN - 2 VIEW  COMPARISON:  Abdominal CT 08/26/2014 and 10/07/2014  FINDINGS: Previously administered oral contrast is visible within the remaining colon (status post right hemicolectomy). As such, dilated loops of bowel in the central abdomen, with fluid levels, consistent with markedly dilated small bowel. No indication of pneumatosis or perforation.  Bandlike opacities in the lower lungs consistent with atelectasis.  IMPRESSION: High-grade small bowel obstruction, new from 10/07/2014.   Electronically Signed   By: Monte Fantasia M.D.   On: 10/09/2014 09:37    Assessment: 44 y.o. 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 Given the low grade nature and pattern of spread of his tumor, this seems sensible.  I do not find any evidence of increased bleeding problems associated with LONSURF.  Appreciate hospitalist service and surgery's help to this patient!   [name], MD 10/11/2014  8:15 AM Medical Oncology and Hematology [facility] [address] [place], [place] [address] Tel. [phone]    Fax. [phone]

## 2014-10-11 NOTE — Progress Notes (Signed)
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   Normocytic anemia - Hematology gave IV iron.  SBO: - Surgery advance diet and rec ambulation. Passing gas abd x-ray show persistent SBO. If no improvement might need surgery. - appreciate surgery assistance.  Code Status: full Family Communication: none  Disposition Plan: inpatient   Consultants:  Oncology  surgery  Procedures: - CT abd and pelvis 2.17.2016: Multiple peritoneal based soft tissue nodules and masses throughout the abdomen compatible with peritoneal recurrence of tumor, largest at the lateral RIGHT mid abdomen 8.1 x 10.4 x 18.2 cm.  Antibiotics:  None  HPI/Subjective: No further vomiting.  Objective: Filed Vitals:   10/10/14 0530 10/10/14 1438 10/10/14 2125 10/11/14 0609  BP: 131/84 119/79 140/78 128/78  Pulse: 84 85 77 78  Temp: 97.7 F (36.5 C) 98.2 F (36.8 C) 97.9 F (36.6 C) 98 F (36.7 C)  TempSrc: Oral Oral Oral Oral  Resp: 20 18 18 18   Height:      Weight:      SpO2: 94% 93% 96% 98%    Intake/Output Summary (Last 24 hours) at 10/11/14 1010 Last data filed at 10/11/14 8850  Gross per 24 hour  Intake   2880 ml  Output    800 ml  Net   2080 ml   Filed Weights   10/09/14 1900  Weight: 100.3 kg (221 lb 1.9 oz)    Exam:  General: Alert, awake, oriented x3, in no acute distress.  HEENT: No bruits, no goiter.  Heart: Regular rate and rhythm. Lungs: Good air movement, clear Abdomen: Soft, diffuse tenderness, slightly distended positive bowel sounds. Neuro: Grossly intact, nonfocal.   Data Reviewed: Basic Metabolic Panel:  Recent Labs Lab 10/07/14 1606 10/08/14 0514 10/08/14 1700 10/09/14 0521 10/10/14 0500  10/11/14 0500  NA 142 137  --  140 137 140  K 3.8 4.1  --  4.5 3.9 3.9  CL 106 99  --  103 100 101  CO2 28 28  --  26 27 28   GLUCOSE 104* 109*  --  144* 148* 157*  BUN 13 8  --  15 13 14   CREATININE 1.02 0.94 0.95 1.05 1.03 0.92  CALCIUM 9.2 9.2  --  9.5 9.5 9.5   Liver Function Tests:  Recent Labs Lab 10/07/14 1606 10/08/14 0514 10/11/14 0500  AST 13 13 16   ALT 12 13 10   ALKPHOS 78 77 65  BILITOT 0.9 0.5 0.7  PROT 7.6 7.4 7.2  ALBUMIN 3.7 3.4* 3.1*    Recent Labs Lab 10/07/14 1606  LIPASE 28   No results for input(s): AMMONIA in the last 168 hours. CBC:  Recent Labs Lab 10/07/14 1606 10/08/14 0514 10/09/14 0521 10/10/14 0500 10/11/14 0500  WBC 6.0 5.2 7.9 16.9* 14.4*  NEUTROABS  --  3.6  --   --   --   HGB 12.4* 12.0* 12.5* 12.3* 12.1*  HCT 39.2 39.0 40.3 38.4* 38.3*  MCV 80.2 80.2 80.3 79.5 80.1  PLT 310 315 376 353 370   Cardiac Enzymes: No results for input(s): CKTOTAL, CKMB, CKMBINDEX, TROPONINI in the last 168 hours. BNP (last 3 results) No results for input(s): BNP in the last 8760 hours.  ProBNP (last 3 results) No results for input(s): PROBNP in the last 8760 hours.  CBG: No results for input(s): GLUCAP in the last 168 hours.  No results found for this or any previous visit (from the past 240 hour(s)).   Studies: Dg Abd 2 Views  10/11/2014   CLINICAL DATA:  Abdominal distention, small bowel obstruction, colon cancer  EXAM: ABDOMEN - 2 VIEW  COMPARISON:  10/09/2014  FINDINGS: Bibasilar atelectasis.  Multiple dilated loops of small bowel in the mid upper and mid abdomen with relative paucity of colonic gas consistent with small bowel obstruction.  Clearance of contrast previously seen within the distal colon.  No definite bowel wall thickening or free intraperitoneal air.  Bones unremarkable and no definite urinary tract calcification is seen.  IMPRESSION: Persistent small bowel obstruction.   Electronically Signed   By: Lavonia Dana M.D.   On:  10/11/2014 07:55    Scheduled Meds: . feeding supplement (ENSURE)  1 Container Oral TID BM  . fentaNYL  25 mcg Transdermal Q72H  . heparin  5,000 Units Subcutaneous 3 times per day  . pantoprazole (PROTONIX) IV  40 mg Intravenous Q12H  . polyethylene glycol  17 g Oral Daily   Continuous Infusions: . dextrose 5 % and 0.45% NaCl 100 mL/hr (10/10/14 1734)     Venetia Constable Marguarite Arbour  [facility] Pager [phone]. If 8PM-8AM, please contact night-coverage at [email or web address], password [facility] 10/11/2014, 10:10 AM  LOS: 3 days

## 2014-10-11 NOTE — Progress Notes (Signed)
Pt requests we use Emla Cream to numb PAC area on chest before accessing PAC.  Staff RN to call MD for an order and will notify me when Emla Cream is available.  Brandon Melnick, RN IV Team

## 2014-10-12 ENCOUNTER — Inpatient Hospital Stay (HOSPITAL_COMMUNITY): Payer: Medicare Other

## 2014-10-12 MED ORDER — ENSURE PUDDING PO PUDG: 1.0000 | Freq: Three times a day (TID) | ORAL | Status: DC

## 2014-10-12 MED ORDER — ENSURE COMPLETE PO LIQD: 237.0000 mL | Freq: Three times a day (TID) | ORAL | Status: DC

## 2014-10-12 NOTE — Progress Notes (Signed)
David Conrad looks pretty good this morning. He feels pretty good this morning. His x-rays still show small bowel structuring. He is having bowel movements. He is taking some clear liquids. He's not having vomiting.  His abdomen is not distended.  I did speak with Dr. Crisoforo Oxford at [facility]. Dr. Crisoforo Oxford saw him about a year and half ago. At that time, he wanted to try intraperitoneal chemotherapy.  He will see David Conrad once he is discharged. It is discharged this week, then Dr. Crisoforo Oxford might be able to see him late this week.  It's not clear as to whether or not David Conrad would be clear cut candidate for recurrent surgery and intraperitoneal chemotherapy. I think that it would certainly be worth a try because she is young and has a great performance status and are options for further systemic chemotherapy are becoming more limited.  He has had no labs today. Let's do they look like tomorrow.  On his physical exam, his vital signs are all stable. Blood pressure is 111/63. Temperature 98.2. Pulse is 73. Lungs are clear. Cardiac exam regular in rhythm. Abdomen is soft. It is not distended. There is no fluid wave. There is no obvious abdominal mass. His bowel sounds are slightly decreased. He has no guarding or rebound tenderness. There is no palpable hepatosplenomegaly. Extremities shows no clubbing, cyanosis or edema. Skin exam no rashes.  For now, I think we should see about getting him home soon. Again was can get him discharged, I will call [facility] for an appointment for him.  I will see what his lab work looks like tomorrow.  He is now willing to go back at [facility] for the possibility of surgery and intraperitoneal chemotherapy.  David E.

## 2014-10-12 NOTE — Progress Notes (Signed)
Subjective: He feels good.  Tolerating clear liquids.  Bowels moving.  Passing gas.  Denies abdominal distension.  Objective: Vital signs in last 24 hours: Temp:  [97.7 F (36.5 C)-98.4 F (36.9 C)] 98.4 F (36.9 C) 2022-11-11 0550) Pulse Rate:  [60-72] 68 November 11, 2022 0550) Resp:  [18-20] 18 11-11-2022 0550) BP: (96-129)/(52-80) 129/80 mmHg 11-11-22 0550) SpO2:  [96 %-98 %] 98 % 11/11/22 0550) Last BM Date: 10/11/14  Intake/Output from previous day: 02/21 0701 - 2022-11-11 0700 In: 2768.3 [P.O.:360; I.V.:2408.3] Out: 854 [Urine:852; Stool:2] Intake/Output this shift: Total I/O In: -  Out: 250 [Urine:250]  PE: General- In NAD Abdomen-soft, not tender, midline scar  Lab Results:   Recent Labs  10/10/14 0500 10/11/14 0500  WBC 16.9* 14.4*  HGB 12.3* 12.1*  HCT 38.4* 38.3*  PLT 353 370   BMET  Recent Labs  10/10/14 0500 10/11/14 0500  NA 137 140  K 3.9 3.9  CL 100 101  CO2 27 28  GLUCOSE 148* 157*  BUN 13 14  CREATININE 1.03 0.92  CALCIUM 9.5 9.5   PT/INR No results for input(s): LABPROT, INR in the last 72 hours. Comprehensive Metabolic Panel:    Component Value Date/Time   NA 140 10/11/2014 0500   NA 137 10/10/2014 0500   NA 140 09/23/2014 1156   NA 141 09/02/2014 0854   K 3.9 10/11/2014 0500   K 3.9 10/10/2014 0500   K 3.8 09/23/2014 1156   K 3.7 09/02/2014 0854   CL 101 10/11/2014 0500   CL 100 10/10/2014 0500   CL 105 09/23/2014 1156   CL 101 09/02/2014 0854   CO2 28 10/11/2014 0500   CO2 27 10/10/2014 0500   CO2 27 09/23/2014 1156   CO2 28 09/02/2014 0854   BUN 14 10/11/2014 0500   BUN 13 10/10/2014 0500   BUN 10 09/23/2014 1156   BUN 15 09/02/2014 0854   CREATININE 0.92 10/11/2014 0500   CREATININE 1.03 10/10/2014 0500   CREATININE 1.2 09/23/2014 1156   CREATININE 1.2 09/02/2014 0854   GLUCOSE 157* 10/11/2014 0500   GLUCOSE 148* 10/10/2014 0500   GLUCOSE 111 09/23/2014 1156   GLUCOSE 113 09/02/2014 0854   CALCIUM 9.5 10/11/2014 0500   CALCIUM  9.5 10/10/2014 0500   CALCIUM 9.4 09/23/2014 1156   CALCIUM 9.5 09/02/2014 0854   AST 16 10/11/2014 0500   AST 13 10/08/2014 0514   AST 13 09/23/2014 1156   AST 18 09/02/2014 0854   ALT 10 10/11/2014 0500   ALT 13 10/08/2014 0514   ALT 12 09/23/2014 1156   ALT 10 09/02/2014 0854   ALKPHOS 65 10/11/2014 0500   ALKPHOS 77 10/08/2014 0514   ALKPHOS 76 09/23/2014 1156   ALKPHOS 80 09/02/2014 0854   BILITOT 0.7 10/11/2014 0500   BILITOT 0.5 10/08/2014 0514   BILITOT 0.60 09/23/2014 1156   BILITOT 0.50 09/02/2014 0854   PROT 7.2 10/11/2014 0500   PROT 7.4 10/08/2014 0514   PROT 7.4 09/23/2014 1156   PROT 7.7 09/02/2014 0854   ALBUMIN 3.1* 10/11/2014 0500   ALBUMIN 3.4* 10/08/2014 0514     Studies/Results: Dg Abd 2 Views  11-11-2014   CLINICAL DATA:  Colon cancer status post colon resection, evaluate small bowel obstruction  EXAM: ABDOMEN - 2 VIEW  COMPARISON:  Abdominal radiographs dated 10/11/2014. CT abdomen pelvis dated 10/07/2014  FINDINGS: Multiple dilated loops of small bowel in the central abdomen.  Surgical sutures in the right mid abdomen.  No evidence of free  air under the diaphragm on the upright view.  Visualized osseous structures are within normal limits.  IMPRESSION: Multiple dilated loops of small bowel in the central abdomen, suggesting partial small bowel obstruction, unchanged.   Electronically Signed   By: Julian Hy M.D.   On: 10/12/2014 09:35   Dg Abd 2 Views  10/11/2014   CLINICAL DATA:  Abdominal distention, small bowel obstruction, colon cancer  EXAM: ABDOMEN - 2 VIEW  COMPARISON:  10/09/2014  FINDINGS: Bibasilar atelectasis.  Multiple dilated loops of small bowel in the mid upper and mid abdomen with relative paucity of colonic gas consistent with small bowel obstruction.  Clearance of contrast previously seen within the distal colon.  No definite bowel wall thickening or free intraperitoneal air.  Bones unremarkable and no definite urinary tract  calcification is seen.  IMPRESSION: Persistent small bowel obstruction.   Electronically Signed   By: Lavonia Dana M.D.   On: 10/11/2014 07:55    Anti-infectives: Anti-infectives    None      Assessment Active Problems: Peritoneal carcinomatosis Partial SBO most likely secondary to above-he appears clinically well despite x-ray appearance.    LOS: 4 days   Rec:  After speaking with Dr. Marin Olp I recommend discharge on a full liquid diet with Boost or Ensure supplements and referral back to Dr. Jenny Reichmann for re-consideration of debulking and intraperitoneal chemotherapy.  This was discussed with the patient and his family.  No need for follow up with [place].   Margie Urbanowicz Lenna Sciara 10/12/2014

## 2014-10-12 NOTE — Care Management Note (Signed)
    Page 1 of 1   10/12/2014     2:02:02 PM CARE MANAGEMENT NOTE 10/12/2014  Patient:  David Conrad, David Conrad   Account Number:  [id number]  Date Initiated:  10/09/2014  Documentation initiated by:  Sunday Spillers  Subjective/Objective Assessment:   44 yo male admitted with abd pain, recurrence of colon cancer. PTA lived at home with mother.     Action/Plan:   Home when stable   Anticipated DC Date:  10/13/2014   Anticipated DC Plan:  [place]  CM consult      Choice offered to / List presented to:             Status of service:  Completed, signed off Medicare Important Message given?  YES (If response is "NO", the following Medicare IM given date fields will be blank) Date Medicare IM given:  10/12/2014 Medicare IM given by:  [facility] Date Additional Medicare IM given:   Additional Medicare IM given by:    Discharge Disposition:  HOME/SELF CARE  Per UR Regulation:  Reviewed for med. necessity/level of care/duration of stay  If discussed at [place] of Stay Meetings, dates discussed:    Comments:

## 2014-10-12 NOTE — Progress Notes (Signed)
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 - oncology and surgery recommended to follow with surgeon at [facility] as an outpatient.  Normocytic anemia - Hbg stable  SBO: - Tolerating diet.  Code Status: full Family Communication: none  Disposition Plan:home in am.   Consultants:  Oncology  surgery  Procedures: - CT abd and pelvis 2.17.2016: Multiple peritoneal based soft tissue nodules and masses throughout the abdomen compatible with peritoneal recurrence of tumor, largest at the lateral RIGHT mid abdomen 8.1 x 10.4 x 18.2 cm.  Antibiotics:  None  HPI/Subjective: No further vomiting.  Objective: Filed Vitals:   10/11/14 0609 10/11/14 1413 10/11/14 2215 10/12/14 0550  BP: 128/78 96/53 100/52 129/80  Pulse: 78 60 72 68  Temp: 98 F (36.7 C) 97.7 F (36.5 C) 98.1 F (36.7 C) 98.4 F (36.9 C)  TempSrc: Oral Oral Oral Oral  Resp: 18 18 20 18   Height:      Weight:      SpO2: 98% 97% 96% 98%    Intake/Output Summary (Last 24 hours) at 10/12/14 1103 Last data filed at 10/12/14 1000  Gross per 24 hour  Intake 2368.33 ml  Output    904 ml  Net 1464.33 ml   Filed Weights   10/09/14 1900  Weight: 100.3 kg (221 lb 1.9 oz)    Exam:  General: Alert, awake, oriented x3, in no acute distress.  HEENT: No bruits, no goiter.  Heart: Regular rate and rhythm. Lungs: Good air movement, clear Abdomen: Soft, none tender, slightly distended positive bowel sounds. Neuro: Grossly intact, nonfocal.   Data Reviewed: Basic Metabolic Panel:  Recent Labs Lab 10/07/14 1606 10/08/14 0514 10/08/14 1700 10/09/14 0521 10/10/14 0500 10/11/14 0500  NA 142 137  --  140 137 140  K 3.8 4.1  --  4.5 3.9 3.9  CL 106 99  --  103 100 101  CO2 28 28  --  26 27 28   GLUCOSE 104* 109*  --  144* 148* 157*  BUN 13 8  --  15 13 14   CREATININE 1.02 0.94 0.95 1.05  1.03 0.92  CALCIUM 9.2 9.2  --  9.5 9.5 9.5   Liver Function Tests:  Recent Labs Lab 10/07/14 1606 10/08/14 0514 10/11/14 0500  AST 13 13 16   ALT 12 13 10   ALKPHOS 78 77 65  BILITOT 0.9 0.5 0.7  PROT 7.6 7.4 7.2  ALBUMIN 3.7 3.4* 3.1*    Recent Labs Lab 10/07/14 1606  LIPASE 28   No results for input(s): AMMONIA in the last 168 hours. CBC:  Recent Labs Lab 10/07/14 1606 10/08/14 0514 10/09/14 0521 10/10/14 0500 10/11/14 0500  WBC 6.0 5.2 7.9 16.9* 14.4*  NEUTROABS  --  3.6  --   --   --   HGB 12.4* 12.0* 12.5* 12.3* 12.1*  HCT 39.2 39.0 40.3 38.4* 38.3*  MCV 80.2 80.2 80.3 79.5 80.1  PLT 310 315 376 353 370   Cardiac Enzymes: No results for input(s): CKTOTAL, CKMB, CKMBINDEX, TROPONINI in the last 168 hours. BNP (last 3 results) No results for input(s): BNP in the last 8760 hours.  ProBNP (last 3 results) No results for input(s): PROBNP in the last 8760 hours.  CBG: No results for input(s): GLUCAP in the last 168 hours.  No results found for this or any previous visit (from the past 240 hour(s)).   Studies: Dg Abd 2  Views  10/12/2014   CLINICAL DATA:  Colon cancer status post colon resection, evaluate small bowel obstruction  EXAM: ABDOMEN - 2 VIEW  COMPARISON:  Abdominal radiographs dated 10/11/2014. CT abdomen pelvis dated 10/07/2014  FINDINGS: Multiple dilated loops of small bowel in the central abdomen.  Surgical sutures in the right mid abdomen.  No evidence of free air under the diaphragm on the upright view.  Visualized osseous structures are within normal limits.  IMPRESSION: Multiple dilated loops of small bowel in the central abdomen, suggesting partial small bowel obstruction, unchanged.   Electronically Signed   By: Julian Hy M.D.   On: 10/12/2014 09:35   Dg Abd 2 Views  10/11/2014   CLINICAL DATA:  Abdominal distention, small bowel obstruction, colon cancer  EXAM: ABDOMEN - 2 VIEW  COMPARISON:  10/09/2014  FINDINGS: Bibasilar atelectasis.   Multiple dilated loops of small bowel in the mid upper and mid abdomen with relative paucity of colonic gas consistent with small bowel obstruction.  Clearance of contrast previously seen within the distal colon.  No definite bowel wall thickening or free intraperitoneal air.  Bones unremarkable and no definite urinary tract calcification is seen.  IMPRESSION: Persistent small bowel obstruction.   Electronically Signed   By: Lavonia Dana M.D.   On: 10/11/2014 07:55    Scheduled Meds: . feeding supplement (ENSURE COMPLETE)  237 mL Oral TID WC  . fentaNYL  25 mcg Transdermal Q72H  . heparin  5,000 Units Subcutaneous 3 times per day  . pantoprazole (PROTONIX) IV  40 mg Intravenous Q12H  . polyethylene glycol  17 g Oral Daily   Continuous Infusions: . dextrose 5 % and 0.45% NaCl 100 mL/hr at 10/11/14 2145     Charlynne Cousins  [facility] Pager [phone]. If 8PM-8AM, please contact night-coverage at [email or web address], password [facility] 10/12/2014, 11:03 AM  LOS: 4 days

## 2014-10-13 ENCOUNTER — Inpatient Hospital Stay (HOSPITAL_COMMUNITY): Payer: Medicare Other

## 2014-10-13 DIAGNOSIS — C801 Malignant (primary) neoplasm, unspecified: Secondary | ICD-10-CM

## 2014-10-13 LAB — COMPREHENSIVE METABOLIC PANEL
ALT: 86 U/L — AB (ref 0–53)
AST: 62 U/L — AB (ref 0–37)
Albumin: 3.2 g/dL — ABNORMAL LOW (ref 3.5–5.2)
Alkaline Phosphatase: 73 U/L (ref 39–117)
Anion gap: 9 (ref 5–15)
BUN: 12 mg/dL (ref 6–23)
CALCIUM: 8.8 mg/dL (ref 8.4–10.5)
CO2: 28 mmol/L (ref 19–32)
CREATININE: 0.95 mg/dL (ref 0.50–1.35)
Chloride: 99 mmol/L (ref 96–112)
GFR calc Af Amer: >90 mL/min (ref >90)
GFR calc non Af Amer: >90 mL/min (ref >90)
Glucose, Bld: 116 mg/dL — ABNORMAL HIGH (ref 70–99)
Potassium: 3.2 mmol/L — ABNORMAL LOW (ref 3.5–5.1)
SODIUM: 136 mmol/L (ref 135–145)
TOTAL PROTEIN: 6.7 g/dL (ref 6.0–8.3)
Total Bilirubin: 0.9 mg/dL (ref 0.3–1.2)

## 2014-10-13 LAB — CBC
HCT: 42.8 % (ref 39.0–52.0)
HEMOGLOBIN: 13.4 g/dL (ref 13.0–17.0)
MCH: 25.1 pg — AB (ref 26.0–34.0)
MCHC: 31.3 g/dL (ref 30.0–36.0)
MCV: 80.1 fL (ref 78.0–100.0)
PLATELETS: 383 10*3/uL (ref 150–400)
RBC: 5.34 MIL/uL (ref 4.22–5.81)
RDW: 17.1 % — ABNORMAL HIGH (ref 11.5–15.5)
WBC: 6.9 10*3/uL (ref 4.0–10.5)

## 2014-10-13 MED ORDER — POLYETHYLENE GLYCOL 3350 17 G PO PACK: 17.0000 g | PACK | Freq: Every day | ORAL | Status: AC

## 2014-10-13 MED ORDER — ENOXAPARIN SODIUM 40 MG/0.4ML ~~LOC~~ SOLN
40.0000 mg | SUBCUTANEOUS | Status: DC
  Filled 2014-10-13 (×2): qty 0.4

## 2014-10-13 MED ORDER — PANTOPRAZOLE SODIUM 40 MG PO TBEC
40.0000 mg | DELAYED_RELEASE_TABLET | Freq: Every day | ORAL | Status: DC
  Administered 2014-10-13: 40 mg via ORAL
  Filled 2014-10-13: qty 1

## 2014-10-13 MED ORDER — LACTULOSE 10 GM/15ML PO SOLN: 20.0000 g | Freq: Every day | ORAL | Status: AC | PRN

## 2014-10-13 MED ORDER — LORAZEPAM 1 MG PO TABS
1.0000 mg | ORAL_TABLET | ORAL | Status: DC | PRN
  Administered 2014-10-13: 1 mg via ORAL
  Filled 2014-10-13: qty 1

## 2014-10-13 NOTE — Progress Notes (Signed)
Patient is alert and oriented, vital signs are stable, iv removed per protocol, patient tolerating her diet without complaints of nausea or vomiting, prescription given Neta Mends RN 10-13-2014 12:52 PM
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
## 2014-10-13 NOTE — Discharge Summary (Signed)
Physician Discharge Summary  David Conrad TZG:[id number] DOB: 04/19/1971 DOA: 10/08/2014  PCP: Volanda Napoleon, MD  Admit date: 10/08/2014 Discharge date: 10/13/2014  Time spent: 30 minutes  Recommendations for Outpatient Follow-up:  1. Follow up with Oncology as an outpatient 2. Follow up with surgeon Dr. Bridgett Larsson at [facility] as an outpatient  Discharge Diagnoses:  Active Problems:   Abdominal pain   Abdominal pain in male   Iron deficiency anemia due to chronic blood loss   SBO (small bowel obstruction)   Discharge Condition: guarded  Diet recommendation: regular  Filed Weights   10/09/14 1900  Weight: 100.3 kg (221 lb 1.9 oz)    History of present illness:  44 y.o. male with known history of metastatic colon cancer who has had prior right hemicolectomy and chemotherapy presents to the ER because of increasing abdominal pain over the last 2 days. Patient denies any nausea vomiting or diarrhea. He did have a bowel movement today which was small amount. In the ER he had a CT abdomen and pelvis which was showing -  Multiple peritoneal based soft tissue nodules and masses throughout the abdomen compatible with peritoneal recurrence of tumor, largest at the lateral RIGHT mid abdomen 8.1 x 10.4 x 18.2 cm.  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 - Continue narcotics for pain control. - oncology and surgery recommended to follow with surgeon at [facility] as an outpatient.  Normocytic anemia - Hbg stable. - RDW was high MCV was borderline low anemia panel was done that shows a low ferritin, he received IV iron.  SBO: - He developed a mild leukocytosis and abdominal pain no bowel movements and abdominal x-ray was done that showed that SOB - Surgery was consulted they recommended conservative management within 2 days he was passing  gas and having bowel movements. - Surgery recommended to follow-up with [facility] as an outpatient. - Home on MiraLAX.  Procedures:  Multiple abdominal x-rays  CT scan of the abdomen and pelvis as below.  Consultations:  Oncology  Gen. surgery  Discharge Exam: Filed Vitals:   10/13/14 0537  BP: 138/86  Pulse: 88  Temp: 97.9 F (36.6 C)  Resp: 18    General: Alert and oriented 3 Cardiovascular: Regular rate and rhythm Respiratory: Good air movement clear to auscultation  Discharge Instructions   Discharge Instructions    Diet - low sodium heart healthy    Complete by:  As directed      Increase activity slowly    Complete by:  As directed           Current Discharge Medication List    START taking these medications   Details  lactulose (CHRONULAC) 10 GM/15ML solution Take 30 mLs (20 g total) by mouth daily as needed for mild constipation. Qty: 240 mL, Refills: 0    polyethylene glycol (MIRALAX / GLYCOLAX) packet Take 17 g by mouth daily. Qty: 14 each, Refills: 0      CONTINUE these medications which have NOT CHANGED   Details  azithromycin (ZITHROMAX Z-PAK) 250 MG tablet Take as directed Qty: 6 each, Refills: 0   Associated Diagnoses: Neuropathic pain of both feet; Acute bronchitis, unspecified organism; Colon cancer, ascending    DM-Doxylamine-Acetaminophen (NYQUIL COLD & FLU PO) Take 1 tablet by mouth 2 (two) times daily as needed (cold symptoms).  Associated Diagnoses: Neuropathic pain of both feet; Acute bronchitis, unspecified organism; Colon cancer, ascending    ibuprofen (ADVIL,MOTRIN) 200 MG tablet Take 200 mg by mouth every 6 (six) hours as needed for moderate pain (pain).     Trifluridine-Tipiracil (LONSURF PO) Take 80 mg by mouth every morning. Take 4 tablets (20 mg) by mouth daily for 14 days. Then STOP Medication for 14 Days. Then Repeat Again.   Associated Diagnoses: Colon cancer, ascending    lidocaine-prilocaine (EMLA)  cream Apply topically as needed. Qty: 30 g, Refills: 3   Associated Diagnoses: Colon cancer, ascending    ondansetron (ZOFRAN) 8 MG tablet Take by mouth every 8 (eight) hours as needed for nausea or vomiting.   Associated Diagnoses: Colon cancer, ascending    Pyridoxine HCl (VITAMIN B-6) 250 MG tablet Take 1 tablet (250 mg total) by mouth daily. Qty: 30 tablet, Refills: 6   Associated Diagnoses: Neuropathic pain of both feet; Acute bronchitis, unspecified organism; Colon cancer, ascending      STOP taking these medications     loperamide (IMODIUM A-D) 2 MG tablet        No Known Allergies    The results of significant diagnostics from this hospitalization (including imaging, microbiology, ancillary and laboratory) are listed below for reference.    Significant Diagnostic Studies: Ct Abdomen Pelvis W Contrast  10/07/2014   CLINICAL DATA:  Diffuse abdominal pain, nausea, history of carcinoma of the colon with tumor removal 3 years ago and chemotherapy  EXAM: CT ABDOMEN AND PELVIS WITH CONTRAST  TECHNIQUE: Multidetector CT imaging of the abdomen and pelvis was performed using the standard protocol following bolus administration of intravenous contrast. Sagittal and coronal MPR images reconstructed from axial data set.  CONTRAST:  163mL OMNIPAQUE IOHEXOL 300 MG/ML  SOLN  COMPARISON:  None  FINDINGS: Bibasilar atelectasis.  Spleen, pancreas, kidneys, and adrenal glands normal appearance.  Scalloped subcapsular lesion lateral aspect RIGHT lobe liver 3.2 x 1.3 cm image 20.  Additional tiny nonspecific low-attenuation foci in liver.  Additional small cyst within RIGHT lobe 14 x 13 mm image 21.  No additional abnormalities of the liver or gallbladder.  Multiple intermediate to low-attenuation masses are seen within the peritoneal cavity.  Large bilobed abnormality in the lateral RIGHT mid abdomen, 8.1 x 10.4 cm in axial dimensions image 38 extending 18.2 cm length.  Lesion in the LEFT mid abdomen  7.9 x 4.7 x 8.0 cm.  Lesions are most consistent with peritoneal tumor recurrence.  Multiple additional smaller peritoneal based nodules are seen at the anterior mid abdomen, LEFT mid abdomen, and pelvis.  No significant ascites, acute inflammatory process, or free intraperitoneal air.  Observed nodules abut multiple bowel loops without causing bowel obstruction or dilatation.  Prior RIGHT hemicolectomy with ileocolic anastomosis at mid transverse colon.  Scattered normal to mildly enlarged mesenteric lymph nodes.  RIGHT inguinal and small umbilical hernias containing fat.  Unremarkable bladder and ureters.  No acute osseous findings.  IMPRESSION: Prior RIGHT hemicolectomy with ileocolic anastomosis at mid transverse colon.  Multiple peritoneal based soft tissue nodules and masses throughout the abdomen compatible with peritoneal recurrence of tumor, largest at the lateral RIGHT mid abdomen 8.1 x 10.4 x 18.2 cm.  No definite evidence of bowel obstruction.   Electronically Signed   By: Lavonia Dana M.D.   On: 10/07/2014 19:03   Dg Abd 2 Views  10/13/2014   CLINICAL DATA:  44 year old male with abdominal pain. History of colon cancer and colon or section.  EXAM: ABDOMEN - 2 VIEW  COMPARISON:  10/12/2014  FINDINGS: Dilated loops of small bowel within the central abdomen are again noted, slightly decreased in caliber.  Gas within the left colon again noted.  No other interval change identified.  No suspicious calcifications are noted.  IMPRESSION: Slightly decreased caliber of dilated small bowel loops within the central abdomen, suggesting partial small bowel obstruction.   Electronically Signed   By: Margarette Canada M.D.   On: 10/13/2014 09:21   Dg Abd 2 Views  10/12/2014   CLINICAL DATA:  Colon cancer status post colon resection, evaluate small bowel obstruction  EXAM: ABDOMEN - 2 VIEW  COMPARISON:  Abdominal radiographs dated 10/11/2014. CT abdomen pelvis dated 10/07/2014  FINDINGS: Multiple dilated loops of small  bowel in the central abdomen.  Surgical sutures in the right mid abdomen.  No evidence of free air under the diaphragm on the upright view.  Visualized osseous structures are within normal limits.  IMPRESSION: Multiple dilated loops of small bowel in the central abdomen, suggesting partial small bowel obstruction, unchanged.   Electronically Signed   By: Julian Hy M.D.   On: 10/12/2014 09:35   Dg Abd 2 Views  10/11/2014   CLINICAL DATA:  Abdominal distention, small bowel obstruction, colon cancer  EXAM: ABDOMEN - 2 VIEW  COMPARISON:  10/09/2014  FINDINGS: Bibasilar atelectasis.  Multiple dilated loops of small bowel in the mid upper and mid abdomen with relative paucity of colonic gas consistent with small bowel obstruction.  Clearance of contrast previously seen within the distal colon.  No definite bowel wall thickening or free intraperitoneal air.  Bones unremarkable and no definite urinary tract calcification is seen.  IMPRESSION: Persistent small bowel obstruction.   Electronically Signed   By: Lavonia Dana M.D.   On: 10/11/2014 07:55   Dg Abd 2 Views  10/09/2014   CLINICAL DATA:  Abdominal pain with no bowel movement. Evaluate for possible small bowel obstruction.  EXAM: ABDOMEN - 2 VIEW  COMPARISON:  Abdominal CT 08/26/2014 and 10/07/2014  FINDINGS: Previously administered oral contrast is visible within the remaining colon (status post right hemicolectomy). As such, dilated loops of bowel in the central abdomen, with fluid levels, consistent with markedly dilated small bowel. No indication of pneumatosis or perforation.  Bandlike opacities in the lower lungs consistent with atelectasis.  IMPRESSION: High-grade small bowel obstruction, new from 10/07/2014.   Electronically Signed   By: Monte Fantasia M.D.   On: 10/09/2014 09:37    Microbiology: No results found for this or any previous visit (from the past 240 hour(s)).   Labs: Basic Metabolic Panel:  Recent Labs Lab 10/08/14 0514  10/08/14 1700 10/09/14 0521 10/10/14 0500 10/11/14 0500 10/13/14 0543  NA 137  --  140 137 140 136  K 4.1  --  4.5 3.9 3.9 3.2*  CL 99  --  103 100 101 99  CO2 28  --  26 27 28 28   GLUCOSE 109*  --  144* 148* 157* 116*  BUN 8  --  15 13 14 12   CREATININE 0.94 0.95 1.05 1.03 0.92 0.95  CALCIUM 9.2  --  9.5 9.5 9.5 8.8   Liver Function Tests:  Recent Labs Lab 10/07/14 1606 10/08/14 0514 10/11/14 0500 10/13/14 0543  AST 13 13 16  62*  ALT 12 13 10  86*  ALKPHOS 78 77 65 73  BILITOT 0.9 0.5 0.7 0.9  PROT 7.6 7.4 7.2 6.7  ALBUMIN 3.7 3.4* 3.1* 3.2*  Recent Labs Lab 10/07/14 1606  LIPASE 28   No results for input(s): AMMONIA in the last 168 hours. CBC:  Recent Labs Lab 10/08/14 0514 10/09/14 0521 10/10/14 0500 10/11/14 0500 10/13/14 0543  WBC 5.2 7.9 16.9* 14.4* 6.9  NEUTROABS 3.6  --   --   --   --   HGB 12.0* 12.5* 12.3* 12.1* 13.4  HCT 39.0 40.3 38.4* 38.3* 42.8  MCV 80.2 80.3 79.5 80.1 80.1  PLT 315 376 353 370 383   Cardiac Enzymes: No results for input(s): CKTOTAL, CKMB, CKMBINDEX, TROPONINI in the last 168 hours. BNP: BNP (last 3 results) No results for input(s): BNP in the last 8760 hours.  ProBNP (last 3 results) No results for input(s): PROBNP in the last 8760 hours.  CBG: No results for input(s): GLUCAP in the last 168 hours.     Signed:  Charlynne Cousins  [facility] 10/13/2014, 11:57 AM

## 2014-10-14 ENCOUNTER — Telehealth: Payer: Self-pay | Admitting: *Deleted

## 2014-10-14 NOTE — Telephone Encounter (Signed)
Dr Marin Olp requested that appointment be made for patient with Dr Crisoforo Oxford this week. Office called and appointment made for 10/16/14 at 2:45pm.   Spoke with patient and his wife and they are aware and will go.

## 2014-10-21 ENCOUNTER — Ambulatory Visit: Payer: Medicare Other | Admitting: Hematology & Oncology

## 2014-10-21 ENCOUNTER — Ambulatory Visit (HOSPITAL_COMMUNITY)
Admission: RE | Admit: 2014-10-21 | Discharge: 2014-10-21 | Disposition: A | Discharge status: Home / Self Care | Payer: Medicare Other | Source: Ambulatory Visit | Attending: Hematology & Oncology | Admitting: Hematology & Oncology

## 2014-10-21 ENCOUNTER — Other Ambulatory Visit: Payer: Self-pay | Admitting: *Deleted

## 2014-10-21 ENCOUNTER — Ambulatory Visit (HOSPITAL_COMMUNITY): Payer: Medicare Other

## 2014-10-21 ENCOUNTER — Other Ambulatory Visit (HOSPITAL_BASED_OUTPATIENT_CLINIC_OR_DEPARTMENT_OTHER): Payer: Medicare Other

## 2014-10-21 ENCOUNTER — Telehealth: Payer: Self-pay | Admitting: *Deleted

## 2014-10-21 ENCOUNTER — Encounter (HOSPITAL_COMMUNITY): Payer: Self-pay

## 2014-10-21 ENCOUNTER — Other Ambulatory Visit: Payer: Medicare Other | Admitting: Lab

## 2014-10-21 DIAGNOSIS — G5793 Unspecified mononeuropathy of bilateral lower limbs: Secondary | ICD-10-CM

## 2014-10-21 DIAGNOSIS — C182 Malignant neoplasm of ascending colon: Secondary | ICD-10-CM | POA: Diagnosis not present

## 2014-10-21 DIAGNOSIS — G629 Polyneuropathy, unspecified: Secondary | ICD-10-CM | POA: Insufficient documentation

## 2014-10-21 DIAGNOSIS — J209 Acute bronchitis, unspecified: Secondary | ICD-10-CM | POA: Insufficient documentation

## 2014-10-21 MED ORDER — IOHEXOL 300 MG/ML  SOLN
80.0000 mL | Freq: Once | INTRAMUSCULAR | Status: AC | PRN
  Administered 2014-10-21: 80 mL via INTRAVENOUS

## 2014-10-21 NOTE — Telephone Encounter (Signed)
Patient cancelled scheduled CT today (Chest and A/P) because he recently had scans and wasn't sure they were necessary. Spoke to Dr Marin Olp who agrees that the patient doesn't need the CT A/P but DOES need the CT chest. Spoke to patient and he will call to reschedule this test.  Also called radiology at [facility] so they are aware of need.

## 2014-10-22 ENCOUNTER — Telehealth: Payer: Self-pay | Admitting: *Deleted

## 2014-10-22 NOTE — Telephone Encounter (Addendum)
Patient happy with results.   ----- Message from Volanda Napoleon, MD sent at 10/21/2014  5:10 PM EST ----- Call - lungs look ok!!  No obvious cancer is in te lungs!!!  Laurey Arrow

## 2014-10-26 ENCOUNTER — Ambulatory Visit (HOSPITAL_BASED_OUTPATIENT_CLINIC_OR_DEPARTMENT_OTHER): Payer: Medicare Other | Admitting: Hematology & Oncology

## 2014-10-26 ENCOUNTER — Other Ambulatory Visit (HOSPITAL_BASED_OUTPATIENT_CLINIC_OR_DEPARTMENT_OTHER): Payer: Medicare Other | Admitting: Lab

## 2014-10-26 VITALS — BP 125/87 | HR 94 | Temp 97.9°F | Resp 18 | Wt 214.0 lb

## 2014-10-26 DIAGNOSIS — C182 Malignant neoplasm of ascending colon: Secondary | ICD-10-CM

## 2014-10-26 DIAGNOSIS — G5793 Unspecified mononeuropathy of bilateral lower limbs: Secondary | ICD-10-CM

## 2014-10-26 DIAGNOSIS — G629 Polyneuropathy, unspecified: Secondary | ICD-10-CM

## 2014-10-26 DIAGNOSIS — J209 Acute bronchitis, unspecified: Secondary | ICD-10-CM

## 2014-10-26 LAB — CBC WITH DIFFERENTIAL (CANCER CENTER ONLY)
BASO#: 0 10*3/uL (ref 0.0–0.2)
BASO%: 0.3 % (ref 0.0–2.0)
EOS ABS: 0.1 10*3/uL (ref 0.0–0.5)
EOS%: 2 % (ref 0.0–7.0)
HCT: 39.6 % (ref 38.7–49.9)
HEMOGLOBIN: 12.4 g/dL — AB (ref 13.0–17.1)
LYMPH#: 1.3 10*3/uL (ref 0.9–3.3)
LYMPH%: 19.7 % (ref 14.0–48.0)
MCH: 25.8 pg — AB (ref 28.0–33.4)
MCHC: 31.3 g/dL — ABNORMAL LOW (ref 32.0–35.9)
MCV: 82 fL (ref 82–98)
MONO#: 0.8 10*3/uL (ref 0.1–0.9)
MONO%: 12.5 % (ref 0.0–13.0)
NEUT%: 65.5 % (ref 40.0–80.0)
NEUTROS ABS: 4.3 10*3/uL (ref 1.5–6.5)
PLATELETS: 346 10*3/uL (ref 145–400)
RBC: 4.81 10*6/uL (ref 4.20–5.70)
RDW: 18.3 % — ABNORMAL HIGH (ref 11.1–15.7)
WBC: 6.6 10*3/uL (ref 4.0–10.0)

## 2014-10-26 LAB — CMP (CANCER CENTER ONLY)
ALK PHOS: 89 U/L — AB (ref 26–84)
ALT(SGPT): 24 U/L (ref 10–47)
AST: 20 U/L (ref 11–38)
Albumin: 3.1 g/dL — ABNORMAL LOW (ref 3.3–5.5)
BUN, Bld: 8 mg/dL (ref 7–22)
CALCIUM: 9.3 mg/dL (ref 8.0–10.3)
CHLORIDE: 102 meq/L (ref 98–108)
CO2: 30 mEq/L (ref 18–33)
Creat: 1.1 mg/dl (ref 0.6–1.2)
GLUCOSE: 103 mg/dL (ref 73–118)
POTASSIUM: 4.2 meq/L (ref 3.3–4.7)
Sodium: 144 mEq/L (ref 128–145)
Total Bilirubin: 0.6 mg/dl (ref 0.20–1.60)
Total Protein: 8.5 g/dL — ABNORMAL HIGH (ref 6.4–8.1)

## 2014-10-26 LAB — CEA: CEA: 23.4 ng/mL — AB (ref 0.0–5.0)

## 2014-10-26 LAB — LACTATE DEHYDROGENASE: LDH: 148 U/L (ref 94–250)

## 2014-10-26 MED ORDER — ALPRAZOLAM 1 MG PO TABS: ORAL_TABLET | ORAL | Status: DC

## 2014-10-26 NOTE — Progress Notes (Signed)
Hematology and Oncology Follow Up Visit  David Conrad [id number] 02-10-1971 44 y.o. 10/26/2014   Principle Diagnosis:   Metastatic colon cancer-K-ras (+)  Current Therapy:    Observation     Interim History:  [name] is back for follow-up. Unfortunately, he was hospitalized recently. He had transient bout of bowel obstruction. This could've been from his malignancy. It could've been from adhesions. This seemed to resolve.  He did go out to [facility]. He saw Dr. Crisoforo Oxford. Dr. Crisoforo Oxford will determine if he is a candidate for cytoreductive surgery and [facility]. Hopefully, he will be area  He is still incredibly overwhelmed by what is going on with him. He is incredibly overwhelmed by all the problems that are going on at home. He has some children who just are not doing well in school and having social issues.  He sees me eating okay. He's had no further nausea or vomiting. He's had some pain issues. He has a relatively large tumor mass over the right side of his abdomen. He is on oxycodone.  He just is confused as to what to do. I have try to get him to do the surgery and intraperitoneal chemotherapy. I want him to do this about a urine have ago but he did not want to do this and we have given him chemotherapy. Unfortunately, he is not been able to keep on a schedule because of social issues and family issues. He is a had delays of several weeks on numerous occasions.  I think that at this point, we really have no option but to retreat him again. He was recently on Lonsurf but this is clearly not helping. His CEA has ever been all that elevated. The last CEA, which was done today was 23.  His performance status is still quite good. I would say that his performance status is ECOG 1.  Medications:  Current outpatient prescriptions:  .  ALPRAZolam (XANAX) 1 MG tablet, Take 1 tablet every 8 hour, IF NEEDED, for anxiety., Disp: 60 tablet, Rfl: 1 .  DM-Doxylamine-Acetaminophen (NYQUIL  COLD & FLU PO), Take 1 tablet by mouth 2 (two) times daily as needed (cold symptoms). , Disp: , Rfl:  .  ibuprofen (ADVIL,MOTRIN) 200 MG tablet, Take 200 mg by mouth every 6 (six) hours as needed for moderate pain (pain). , Disp: , Rfl:  .  lactulose (CHRONULAC) 10 GM/15ML solution, Take 30 mLs (20 g total) by mouth daily as needed for mild constipation., Disp: 240 mL, Rfl: 0 .  lidocaine-prilocaine (EMLA) cream, Apply topically as needed. (Patient not taking: Reported on 10/08/2014), Disp: 30 g, Rfl: 3 .  ondansetron (ZOFRAN) 8 MG tablet, Take by mouth every 8 (eight) hours as needed for nausea or vomiting., Disp: , Rfl:  .  polyethylene glycol (MIRALAX / GLYCOLAX) packet, Take 17 g by mouth daily., Disp: 14 each, Rfl: 0 .  Pyridoxine HCl (VITAMIN B-6) 250 MG tablet, Take 1 tablet (250 mg total) by mouth daily., Disp: 30 tablet, Rfl: 6  Allergies: No Known Allergies  Past Medical History, Surgical history, Social history, and Family History were reviewed and updated.  Review of Systems: As above  Physical Exam:  weight is 214 lb (97.07 kg). His oral temperature is 97.9 F (36.6 C). His blood pressure is 125/87 and his pulse is 94. His respiration is 18.   Wt Readings from Last 3 Encounters:  10/26/14 214 lb (97.07 kg)  10/09/14 221 lb 1.9 oz (100.3 kg)  09/23/14 228  lb (103.42 kg)     Well-developed and well-nourished white gentleman in no obvious distress. Head and neck exam shows no ocular or oral lesions. There are no palpable cervical or supraclavicular lymph nodes. Lungs are clear. Cardiac exam regular rate and rhythm with no murmurs, rubs or bruits. Abdomen is soft. It's not as distended. He has recent bowel sounds. There is some slight tenderness over the right side. There is some slight fullness on the right side. There is no palpable liver or spleen tip. Back exam shows no tenderness over the spine, ribs or hips. Extremities shows no clubbing, cyanosis or edema. Skin exam no  rashes, ecchymoses or petechia. Neurological exam shows no focal neurological deficits.  Lab Results  Component Value Date   WBC 6.6 10/26/2014   HGB 12.4* 10/26/2014   HCT 39.6 10/26/2014   MCV 82 10/26/2014   PLT 346 10/26/2014     Chemistry      Component Value Date/Time   NA 144 10/26/2014 0955   NA 136 10/13/2014 0543   K 4.2 10/26/2014 0955   K 3.2* 10/13/2014 0543   CL 102 10/26/2014 0955   CL 99 10/13/2014 0543   CO2 30 10/26/2014 0955   CO2 28 10/13/2014 0543   BUN 8 10/26/2014 0955   BUN 12 10/13/2014 0543   CREATININE 1.1 10/26/2014 0955   CREATININE 0.95 10/13/2014 0543      Component Value Date/Time   CALCIUM 9.3 10/26/2014 0955   CALCIUM 8.8 10/13/2014 0543   ALKPHOS 89* 10/26/2014 0955   ALKPHOS 73 10/13/2014 0543   AST 20 10/26/2014 0955   AST 62* 10/13/2014 0543   ALT 24 10/26/2014 0955   ALT 86* 10/13/2014 0543   BILITOT 0.60 10/26/2014 0955   BILITOT 0.9 10/13/2014 0543         Impression and Plan: [name] is 44 year old gentleman. He has progressive metastatic colon cancer.  His cancer is K-ras positive. We cannot use EGFR inhibitors.  I think that we need to try to treat him until he figures out what he will do with respect to surgery. Hopefully he will be a candidate for surgery.  We will try to get started on chemotherapy next week. I will use FOLFOXIRI/Avastin. I think this would be reasonable.  He really is tenuous with make a decision about treatment. He will go with systemic chemotherapy.  He understands the side effects of chemotherapy. He agrees to this. We will get started next week. I'll see him back 2 weeks afterwards for his second cycle.  If he does not want surgery and intraperitoneal chemotherapy, I would give 4 cycles of treatment and then rescan him.  I spent about 45 minutes with him. Most of the time I try to comfort him and try to get him through all of his personal issues. Volanda Napoleon, MD 3/7/20166:20 PM

## 2014-11-02 ENCOUNTER — Ambulatory Visit (HOSPITAL_BASED_OUTPATIENT_CLINIC_OR_DEPARTMENT_OTHER)
Admission: RE | Admit: 2014-11-02 | Discharge: 2014-11-02 | Disposition: A | Discharge status: Home / Self Care | Payer: Medicare Other | Source: Ambulatory Visit | Attending: Hematology & Oncology | Admitting: Hematology & Oncology

## 2014-11-02 ENCOUNTER — Other Ambulatory Visit: Payer: Self-pay | Admitting: *Deleted

## 2014-11-02 ENCOUNTER — Ambulatory Visit (HOSPITAL_BASED_OUTPATIENT_CLINIC_OR_DEPARTMENT_OTHER): Payer: Medicare Other

## 2014-11-02 ENCOUNTER — Ambulatory Visit (HOSPITAL_BASED_OUTPATIENT_CLINIC_OR_DEPARTMENT_OTHER): Payer: Medicare Other | Admitting: Hematology & Oncology

## 2014-11-02 ENCOUNTER — Other Ambulatory Visit (HOSPITAL_BASED_OUTPATIENT_CLINIC_OR_DEPARTMENT_OTHER): Payer: Medicare Other | Admitting: Lab

## 2014-11-02 VITALS — BP 133/83 | HR 103 | Temp 98.2°F | Resp 20

## 2014-11-02 DIAGNOSIS — R109 Unspecified abdominal pain: Secondary | ICD-10-CM | POA: Diagnosis not present

## 2014-11-02 DIAGNOSIS — C182 Malignant neoplasm of ascending colon: Secondary | ICD-10-CM

## 2014-11-02 DIAGNOSIS — R1013 Epigastric pain: Secondary | ICD-10-CM

## 2014-11-02 DIAGNOSIS — G629 Polyneuropathy, unspecified: Secondary | ICD-10-CM | POA: Diagnosis not present

## 2014-11-02 DIAGNOSIS — Z85038 Personal history of other malignant neoplasm of large intestine: Secondary | ICD-10-CM | POA: Insufficient documentation

## 2014-11-02 DIAGNOSIS — R52 Pain, unspecified: Secondary | ICD-10-CM | POA: Diagnosis not present

## 2014-11-02 LAB — CMP (CANCER CENTER ONLY)
ALBUMIN: 2.9 g/dL — AB (ref 3.3–5.5)
ALK PHOS: 73 U/L (ref 26–84)
ALT(SGPT): 18 U/L (ref 10–47)
AST: 18 U/L (ref 11–38)
BILIRUBIN TOTAL: 0.6 mg/dL (ref 0.20–1.60)
BUN, Bld: 8 mg/dL (ref 7–22)
CO2: 29 mEq/L (ref 18–33)
Calcium: 9.5 mg/dL (ref 8.0–10.3)
Chloride: 103 mEq/L (ref 98–108)
Creat: 1 mg/dl (ref 0.6–1.2)
Glucose, Bld: 94 mg/dL (ref 73–118)
Potassium: 4.4 mEq/L (ref 3.3–4.7)
Sodium: 140 mEq/L (ref 128–145)
TOTAL PROTEIN: 7.9 g/dL (ref 6.4–8.1)

## 2014-11-02 MED ORDER — FAMOTIDINE IN NACL 20-0.9 MG/50ML-% IV SOLN
INTRAVENOUS | Status: AC
  Filled 2014-11-02: qty 50

## 2014-11-02 MED ORDER — DEXTROSE 5 % IV SOLN: Freq: Once | INTRAVENOUS | Status: DC

## 2014-11-02 MED ORDER — SODIUM CHLORIDE 0.9 % IJ SOLN
10.0000 mL | INTRAMUSCULAR | Status: DC | PRN
  Administered 2014-11-02: 10 mL
  Filled 2014-11-02: qty 10

## 2014-11-02 MED ORDER — SODIUM CHLORIDE 0.9 % IV SOLN
Freq: Once | INTRAVENOUS | Status: DC
  Administered 2014-11-02: 10:00:00 via INTRAVENOUS

## 2014-11-02 MED ORDER — LOPERAMIDE HCL 2 MG PO TABS: ORAL_TABLET | ORAL | Status: DC

## 2014-11-02 MED ORDER — HEPARIN SOD (PORK) LOCK FLUSH 100 UNIT/ML IV SOLN
500.0000 [IU] | Freq: Once | INTRAVENOUS | Status: AC | PRN
  Administered 2014-11-02: 500 [IU]
  Filled 2014-11-02: qty 5

## 2014-11-02 MED ORDER — MORPHINE SULFATE 4 MG/ML IJ SOLN
INTRAMUSCULAR | Status: AC
  Filled 2014-11-02: qty 1

## 2014-11-02 MED ORDER — FAMOTIDINE IN NACL 20-0.9 MG/50ML-% IV SOLN
40.0000 mg | Freq: Once | INTRAVENOUS | Status: AC
  Administered 2014-11-02: 40 mg via INTRAVENOUS

## 2014-11-02 MED ORDER — MORPHINE SULFATE 4 MG/ML IJ SOLN
4.0000 mg | Freq: Once | INTRAMUSCULAR | Status: AC
Start: 2014-11-02 — End: 2014-11-02
  Administered 2014-11-02: 4 mg via INTRAVENOUS

## 2014-11-02 NOTE — Progress Notes (Signed)
Hematology and Oncology Follow Up Visit  David Conrad [id number] Oct 12, 1970 44 y.o. 11/02/2014   Principle Diagnosis:   Metastatic colon cancer-K-ras (+)  Current Therapy:    Observation     Interim History:  [name] is back for treatment. Unfortunately, he just is not doing well. He is again and currently emotional. He says he is in a lot of pain. Per we did go ahead and get an abdominal flat and upright x-ray. This did not show any obvious obstruction. I'm sure that the pain he is having is from the underlying malignancy.  He has not eaten much. He's had no bowel movement for a couple days. I need to make sure that he is taking the lactulose. We may need to add MiraLAX also.  He looks a little dehydrated.  I don't think we can treat him today. I will go ahead and give him IV fluids.  I had a long talk with him again. I told him that we have 2 either treat him or see if [facility] will take him and consider him for surgery. I don't think that they would take him immediately.  He got 2 L of IV fluid.  We got some lab work on him. His lites lites and liver panel looked okay. His albumin is down.  He just has a lot of issues going on. Some of his issues had to deal with his family. This really is bothering him and he really is doing his best to try to keep everything together so that he will not "let his family down". Medications:  Current outpatient prescriptions:  .  ALPRAZolam (XANAX) 1 MG tablet, Take 1 tablet every 8 hour, IF NEEDED, for anxiety., Disp: 60 tablet, Rfl: 1 .  DM-Doxylamine-Acetaminophen (NYQUIL COLD & FLU PO), Take 1 tablet by mouth 2 (two) times daily as needed (cold symptoms). , Disp: , Rfl:  .  ibuprofen (ADVIL,MOTRIN) 200 MG tablet, Take 200 mg by mouth every 6 (six) hours as needed for moderate pain (pain). , Disp: , Rfl:  .  lactulose (CHRONULAC) 10 GM/15ML solution, Take 30 mLs (20 g total) by mouth daily as needed for mild constipation., Disp: 240  mL, Rfl: 0 .  lidocaine-prilocaine (EMLA) cream, Apply topically as needed. (Patient not taking: Reported on 10/08/2014), Disp: 30 g, Rfl: 3 .  loperamide (IMODIUM A-D) 2 MG tablet, Take 2 at onset of diarrhea, then 1 every 2hrs until 12hr without a BM. May take 2 tab every 4hrs at bedtime. If diarrhea recurs repeat., Disp: 100 tablet, Rfl: 1 .  ondansetron (ZOFRAN) 8 MG tablet, Take by mouth every 8 (eight) hours as needed for nausea or vomiting., Disp: , Rfl:  .  polyethylene glycol (MIRALAX / GLYCOLAX) packet, Take 17 g by mouth daily., Disp: 14 each, Rfl: 0 .  Pyridoxine HCl (VITAMIN B-6) 250 MG tablet, Take 1 tablet (250 mg total) by mouth daily., Disp: 30 tablet, Rfl: 6 No current facility-administered medications for this visit.  Facility-Administered Medications Ordered in Other Visits:  .  dextrose 5 % solution, , Intravenous, Once, Volanda Napoleon, MD .  sodium chloride 0.9 % injection 10 mL, 10 mL, Intracatheter, PRN, Volanda Napoleon, MD, 10 mL at 11/02/14 1430  Allergies: No Known Allergies  Past Medical History, Surgical history, Social history, and Family History were reviewed and updated.  Review of Systems: As above  Physical Exam:  vitals were not taken for this visit.  Wt Readings from Last 3 Encounters:  10/26/14 214 lb (97.07 kg)  10/09/14 221 lb 1.9 oz (100.3 kg)  09/23/14 228 lb (103.42 kg)     Well-developed and well-nourished white gentleman in no obvious distress. Head and neck exam shows no ocular or oral lesions. There are no palpable cervical or supraclavicular lymph nodes. Lungs are clear. Cardiac exam regular rate and rhythm with no murmurs, rubs or bruits. Abdomen is soft. It's not as distended. He has recent bowel sounds. There is some slight tenderness over the right side. There is some slight fullness on the right side. There is no palpable liver or spleen tip. Back exam shows no tenderness over the spine, ribs or hips. Extremities shows no clubbing,  cyanosis or edema. Skin exam no rashes, ecchymoses or petechia. Neurological exam shows no focal neurological deficits.  Lab Results  Component Value Date   WBC 6.6 10/26/2014   HGB 12.4* 10/26/2014   HCT 39.6 10/26/2014   MCV 82 10/26/2014   PLT 346 10/26/2014     Chemistry      Component Value Date/Time   NA 140 11/02/2014 1300   NA 136 10/13/2014 0543   K 4.4 11/02/2014 1300   K 3.2* 10/13/2014 0543   CL 103 11/02/2014 1300   CL 99 10/13/2014 0543   CO2 29 11/02/2014 1300   CO2 28 10/13/2014 0543   BUN 8 11/02/2014 1300   BUN 12 10/13/2014 0543   CREATININE 1.0 11/02/2014 1300   CREATININE 0.95 10/13/2014 0543      Component Value Date/Time   CALCIUM 9.5 11/02/2014 1300   CALCIUM 8.8 10/13/2014 0543   ALKPHOS 73 11/02/2014 1300   ALKPHOS 73 10/13/2014 0543   AST 18 11/02/2014 1300   AST 62* 10/13/2014 0543   ALT 18 11/02/2014 1300   ALT 86* 10/13/2014 0543   BILITOT 0.60 11/02/2014 1300   BILITOT 0.9 10/13/2014 0543         Impression and Plan: [name] is 44 year old gentleman. He has progressive metastatic colon cancer.  His cancer is K-ras positive. We cannot use EGFR inhibitors.  I think that we need to try to treat him until he figures out what he will do with respect to surgery. Hopefully he will be a candidate for surgery.  We will try to get started on chemotherapy next week. I will use FOLFOXIRI/Avastin. I think this would be reasonable.  We will give him IV fluids in the office. I will make sure that he is on lactulose. Thank you, the x-ray that we did do not show any evidence of bowel junction.  I think that he really, really needs to somehow get to surgery to try to resect out what can be resected.  I note that this is a very tough situation for him. I've talked to him at length about what is going on and that he just will not get better unless he does take treatment. Volanda Napoleon, MD 3/14/20163:22 PM

## 2014-11-02 NOTE — Patient Instructions (Signed)
Dehydration, Adult Dehydration is when you lose more fluids from the body than you take in. Vital organs like the kidneys, brain, and heart cannot function without a proper amount of fluids and salt. Any loss of fluids from the body can cause dehydration.  CAUSES   Vomiting.  Diarrhea.  Excessive sweating.  Excessive urine output.  Fever. SYMPTOMS  Mild dehydration  Thirst.  Dry lips.  Slightly dry mouth. Moderate dehydration  Very dry mouth.  Sunken eyes.  Skin does not bounce back quickly when lightly pinched and released.  Dark urine and decreased urine production.  Decreased tear production.  Headache. Severe dehydration  Very dry mouth.  Extreme thirst.  Rapid, weak pulse (more than 100 beats per minute at rest).  Cold hands and feet.  Not able to sweat in spite of heat and temperature.  Rapid breathing.  Blue lips.  Confusion and lethargy.  Difficulty being awakened.  Minimal urine production.  No tears. DIAGNOSIS  Your caregiver will diagnose dehydration based on your symptoms and your exam. Blood and urine tests will help confirm the diagnosis. The diagnostic evaluation should also identify the cause of dehydration. TREATMENT  Treatment of mild or moderate dehydration can often be done at home by increasing the amount of fluids that you drink. It is best to drink small amounts of fluid more often. Drinking too much at one time can make vomiting worse. Refer to the home care instructions below. Severe dehydration needs to be treated at the hospital where you will probably be given intravenous (IV) fluids that contain water and electrolytes. HOME CARE INSTRUCTIONS   Ask your caregiver about specific rehydration instructions.  Drink enough fluids to keep your urine clear or pale yellow.  Drink small amounts frequently if you have nausea and vomiting.  Eat as you normally do.  Avoid:  Foods or drinks high in sugar.  Carbonated  drinks.  Juice.  Extremely hot or cold fluids.  Drinks with caffeine.  Fatty, greasy foods.  Alcohol.  Tobacco.  Overeating.  Gelatin desserts.  Wash your hands well to avoid spreading bacteria and viruses.  Only take over-the-counter or prescription medicines for pain, discomfort, or fever as directed by your caregiver.  Ask your caregiver if you should continue all prescribed and over-the-counter medicines.  Keep all follow-up appointments with your caregiver. SEEK MEDICAL CARE IF:  You have abdominal pain and it increases or stays in one area (localizes).  You have a rash, stiff neck, or severe headache.  You are irritable, sleepy, or difficult to awaken.  You are weak, dizzy, or extremely thirsty. SEEK IMMEDIATE MEDICAL CARE IF:   You are unable to keep fluids down or you get worse despite treatment.  You have frequent episodes of vomiting or diarrhea.  You have blood or green matter (bile) in your vomit.  You have blood in your stool or your stool looks black and tarry.  You have not urinated in 6 to 8 hours, or you have only urinated a small amount of very dark urine.  You have a fever.  You faint. MAKE SURE YOU:   Understand these instructions.  Will watch your condition.  Will get help right away if you are not doing well or get worse. Document Released: 08/07/2005 Document Revised: 10/30/2011 Document Reviewed: 03/27/2011 [employer] Patient Information 2015 [employer]. This information is not intended to replace advice given to you by your health care provider. Make sure you discuss any questions you have with your health care   provider.  Morphine injection solution What is this medicine? MORPHINE (MOR feen) is a pain reliever. It is used to treat moderate to severe pain. This medicine may be used for other purposes; ask your health care provider or pharmacist if you have questions. COMMON BRAND NAME(S): Astramorph PF, Duramorph, Duramorph PF,  Infumorph What should I tell my health care provider before I take this medicine? They need to know if you have any of these conditions: -brain tumor -drug abuse or addiction -head injury -heart disease -frequently drink alcohol containing drinks -intestinal disease -kidney disease or problems urinating -kyphoscoliosis -liver disease -lung or breathing disease, like asthma -seizures -taken an MAOI like Carbex, Eldepryl, Marplan, Nardil, or Parnate in last 14 days -an unusual or allergic reaction to morphine, other pain medicines, foods, dyes, or preservatives -pregnant or trying to get pregnant -breast-feeding How should I use this medicine? This medicine is for injection into a muscle, vein, or under the skin. It is usually given by a health care professional in a hospital or clinic setting. If you get this medicine at home, you will be taught how to prepare and give this medicine. Use exactly as directed. Take your medicine at regular intervals. Do not take your medicine more often than directed. Always look at your medicine before using it. Do not use the injection if its color is darker than pale yellow or if it is discolored in any other way. Do not use this medicine if it is cloudy, thickened, colored, or has solid particles in it. It is important that you put your used needles and syringes in a special sharps container. Do not put them in a trash can. If you do not have a sharps container, call your pharmacist or healthcare provider to get one. Talk to your pediatrician regarding the use of this medicine in children. Special care may be needed. Overdosage: If you think you have taken too much of this medicine contact a poison control center or emergency room at once. NOTE: This medicine is only for you. Do not share this medicine with others. What if I miss a dose? If you miss a dose, take it as soon as you can. If it is almost time for your next dose, take only that dose. Do not  take double or extra doses. What may interact with this medicine? Do not take this medicine with any of the following medications: -MAOIs like Carbex, Eldepryl, Marplan, Nardil, and Parnate This medicine may also interact with the following medications: -alcohol -antihistamines -barbiturates, like phenobarbital -medicines for depression, anxiety, or psychotic disturbances -medicines for sleep -muscle relaxants -naltrexone, naloxone -narcotic medicines (opiates) for pain -rifampin -tramadol This list may not describe all possible interactions. Give your health care provider a list of all the medicines, herbs, non-prescription drugs, or dietary supplements you use. Also tell them if you smoke, drink alcohol, or use illegal drugs. Some items may interact with your medicine. What should I watch for while using this medicine? Tell your doctor or health care professional if your pain does not go away, if it gets worse, or if you have new or a different type of pain. You may develop tolerance to the medicine. Tolerance means that you will need a higher dose of the medicine for pain relief. Tolerance is normal and is expected if you take this medicine for a long time. Do not suddenly stop taking your medicine because you may develop a severe reaction. Your body becomes used to the medicine. This does  NOT mean you are addicted. Addiction is a behavior related to getting and using a drug for a non-medical reason. If you have pain, you have a medical reason to take pain medicine. Your doctor will tell you how much medicine to take. If your doctor wants you to stop the medicine, the dose will be slowly lowered over time to avoid any side effects. You may get drowsy or dizzy. Do not drive, use machinery, or do anything that needs mental alertness until you know how this medicine affects you. Do not stand or sit up quickly, especially if you are an older patient. This reduces the risk of dizzy or fainting  spells. Alcohol may interfere with the effect of this medicine. Avoid alcoholic drinks. There are different types of narcotic medicines (opiates) for pain. If you take more than one type at the same time, you may have more side effects. Give your health care provider a list of all medicines you use. Your doctor will tell you how much medicine to take. Do not take more medicine than directed. Call emergency for help if you have problems breathing. This medicine will cause constipation. Try to have a bowel movement at least every 2 to 3 days. If you do not have a bowel movement for 3 days, call your doctor or health care professional. Your mouth may get dry. Drinking water, chewing sugarless gum, or sucking on hard candy may help. See your dentist every 6 months. What side effects may I notice from receiving this medicine? Side effects that you should report to your doctor or health care professional as soon as possible: -allergic reactions like skin rash, itching or hives, swelling of the face, lips, or tongue -breathing problems -change in the amount of urine -confusion -feeling faint or lightheaded -fever, chills -hallucinations -red or sore at the injection site -seizures -slow or fast heartbeat -unusually weak or tired Side effects that usually do not require medical attention (report to your doctor or health care professional if they continue or are bothersome): -constipation -dizziness -headache -nausea, vomiting -pinpoint pupils -sweating This list may not describe all possible side effects. Call your doctor for medical advice about side effects. You may report side effects to FDA at 1-800-FDA-1088. Where should I keep my medicine? Keep out of the reach of children. This medicine can be abused. Keep it in a safe place to protect it from theft. Do not share this medicine with anyone. Selling or giving away this medicine is dangerous and is against the law. If you are using this  medicine at home, you will be instructed on how to store this medicine. Throw away any unused medicine after the expiration date on the label. Discard unused medicine and used packaging carefully. Pets and children can be harmed if they find used or lost packages. NOTE: This sheet is a summary. It may not cover all possible information. If you have questions about this medicine, talk to your doctor, pharmacist, or health care provider.  2015, [employer]. (2013-01-15 21:34:59)

## 2014-11-04 ENCOUNTER — Ambulatory Visit (HOSPITAL_BASED_OUTPATIENT_CLINIC_OR_DEPARTMENT_OTHER): Payer: Medicare Other

## 2014-11-04 ENCOUNTER — Ambulatory Visit (HOSPITAL_BASED_OUTPATIENT_CLINIC_OR_DEPARTMENT_OTHER): Payer: Medicare Other | Admitting: Hematology & Oncology

## 2014-11-04 VITALS — BP 117/89 | HR 105 | Temp 98.6°F | Resp 18

## 2014-11-04 DIAGNOSIS — R109 Unspecified abdominal pain: Secondary | ICD-10-CM

## 2014-11-04 DIAGNOSIS — C189 Malignant neoplasm of colon, unspecified: Secondary | ICD-10-CM | POA: Insufficient documentation

## 2014-11-04 DIAGNOSIS — C182 Malignant neoplasm of ascending colon: Secondary | ICD-10-CM

## 2014-11-04 DIAGNOSIS — K56609 Unspecified intestinal obstruction, unspecified as to partial versus complete obstruction: Secondary | ICD-10-CM | POA: Insufficient documentation

## 2014-11-04 MED ORDER — MORPHINE SULFATE 4 MG/ML IJ SOLN
INTRAMUSCULAR | Status: AC
  Filled 2014-11-04: qty 1

## 2014-11-04 MED ORDER — SODIUM CHLORIDE 0.9 % IV SOLN
INTRAVENOUS | Status: DC
  Administered 2014-11-04: 09:00:00 via INTRAVENOUS

## 2014-11-04 MED ORDER — HEPARIN SOD (PORK) LOCK FLUSH 100 UNIT/ML IV SOLN
500.0000 [IU] | Freq: Once | INTRAVENOUS | Status: AC
  Administered 2014-11-04: 500 [IU] via INTRAVENOUS
  Filled 2014-11-04: qty 5

## 2014-11-04 MED ORDER — SODIUM CHLORIDE 0.9 % IJ SOLN
10.0000 mL | INTRAMUSCULAR | Status: DC | PRN
  Administered 2014-11-04: 10 mL via INTRAVENOUS
  Filled 2014-11-04: qty 10

## 2014-11-04 NOTE — Progress Notes (Signed)
Hematology and Oncology Follow Up Visit  ALOK MINSHALL [id number] Mar 28, 1971 44 y.o. 11/04/2014   Principle Diagnosis:   Metastatic colon cancer-KRAS (+)  Current Therapy:    Observation     Interim History:  Mr. Edmondson is back for treatment. Unfortunately, he still is not doing well. He is having a hard time eating. He does cannot eat that much. He gets quite nauseated whenever he eats. He is having are time going to the bathroom. He's having more the way of pain.  When we saw him a couple days ago, we did an abdominal x-ray on him. He did not show any obvious obstruction. However, he certainly is acting as if he does have impending obstruction. He has a large tumor over on the right side of his abdomen.  We are given him  IV fluids. he did get some pain medication.   I had a long talk with him again. I told him that we have  to now consider surgery. I think that surgery is going be the best way of trying to alleviate his symptoms.  I spoke with Dr. Crisoforo Oxford at [facility]. He has seen Mr. Omlor before. He has graciously agreed to get him over there today and admit him through there clinic.     He just has a lot of issues going on. Some of his issues had to deal with his family. This really is bothering him and he really is doing his best to try to keep everything together so that he will not "let his family down". Medications:  Current outpatient prescriptions:  .  ALPRAZolam (XANAX) 1 MG tablet, Take 1 tablet every 8 hour, IF NEEDED, for anxiety., Disp: 60 tablet, Rfl: 1 .  DM-Doxylamine-Acetaminophen (NYQUIL COLD & FLU PO), Take 1 tablet by mouth 2 (two) times daily as needed (cold symptoms). , Disp: , Rfl:  .  ibuprofen (ADVIL,MOTRIN) 200 MG tablet, Take 200 mg by mouth every 6 (six) hours as needed for moderate pain (pain). , Disp: , Rfl:  .  lactulose (CHRONULAC) 10 GM/15ML solution, Take 30 mLs (20 g total) by mouth daily as needed for mild constipation., Disp: 240 mL,  Rfl: 0 .  lidocaine-prilocaine (EMLA) cream, Apply topically as needed. (Patient not taking: Reported on 10/08/2014), Disp: 30 g, Rfl: 3 .  loperamide (IMODIUM A-D) 2 MG tablet, Take 2 at onset of diarrhea, then 1 every 2hrs until 12hr without a BM. May take 2 tab every 4hrs at bedtime. If diarrhea recurs repeat., Disp: 100 tablet, Rfl: 1 .  ondansetron (ZOFRAN) 8 MG tablet, Take by mouth every 8 (eight) hours as needed for nausea or vomiting., Disp: , Rfl:  .  polyethylene glycol (MIRALAX / GLYCOLAX) packet, Take 17 g by mouth daily., Disp: 14 each, Rfl: 0 .  Pyridoxine HCl (VITAMIN B-6) 250 MG tablet, Take 1 tablet (250 mg total) by mouth daily., Disp: 30 tablet, Rfl: 6 No current facility-administered medications for this visit.  Facility-Administered Medications Ordered in Other Visits:  .  0.9 %  sodium chloride infusion, , Intravenous, Continuous, Volanda Napoleon, MD, Stopped at 11/04/14 1313 .  dextrose 5 % solution, , Intravenous, Once, Volanda Napoleon, MD .  sodium chloride 0.9 % injection 10 mL, 10 mL, Intracatheter, PRN, Volanda Napoleon, MD, 10 mL at 11/02/14 1430 .  sodium chloride 0.9 % injection 10 mL, 10 mL, Intravenous, PRN, Volanda Napoleon, MD, 10 mL at 11/04/14 1313  Allergies: No Known Allergies  Past  Medical History, Surgical history, Social history, and Family History were reviewed and updated.  Review of Systems: As above  Physical Exam:  vitals were not taken for this visit.  Wt Readings from Last 3 Encounters:  10/26/14 214 lb (97.07 kg)  10/09/14 221 lb 1.9 oz (100.3 kg)  09/23/14 228 lb (103.42 kg)     Well-developed and well-nourished white gentleman in no obvious distress. Head and neck exam shows no ocular or oral lesions. There are no palpable cervical or supraclavicular lymph nodes. Lungs are clear. Cardiac exam regular rate and rhythm with no murmurs, rubs or bruits. Abdomen is soft. It's not as distended. He has Decentowel sounds. There is some slight  tenderness over the right side. There is some slight fullness on the right side. There is no palpable liver or spleen tip. Back exam shows no tenderness over the spine, ribs or hips. Extremities shows no clubbing, cyanosis or edema. Skin exam no rashes, ecchymoses or petechia. Neurological exam shows no focal neurological deficits.  Lab Results  Component Value Date   WBC 6.6 10/26/2014   HGB 12.4* 10/26/2014   HCT 39.6 10/26/2014   MCV 82 10/26/2014   PLT 346 10/26/2014     Chemistry      Component Value Date/Time   NA 140 11/02/2014 1300   NA 136 10/13/2014 0543   K 4.4 11/02/2014 1300   K 3.2* 10/13/2014 0543   CL 103 11/02/2014 1300   CL 99 10/13/2014 0543   CO2 29 11/02/2014 1300   CO2 28 10/13/2014 0543   BUN 8 11/02/2014 1300   BUN 12 10/13/2014 0543   CREATININE 1.0 11/02/2014 1300   CREATININE 0.95 10/13/2014 0543      Component Value Date/Time   CALCIUM 9.5 11/02/2014 1300   CALCIUM 8.8 10/13/2014 0543   ALKPHOS 73 11/02/2014 1300   ALKPHOS 73 10/13/2014 0543   AST 18 11/02/2014 1300   AST 62* 10/13/2014 0543   ALT 18 11/02/2014 1300   ALT 86* 10/13/2014 0543   BILITOT 0.60 11/02/2014 1300   BILITOT 0.9 10/13/2014 0543         Impression and Plan: Mr. Jenkinson is 44 year old gentleman. He has progressive metastatic colon cancer.  His cancer is K-ras positive. We cannot use EGFR inhibitors.  Hopefully, he will be able to have surgery. I know that this would be a huge undertaking for him. However, he is young and overall still in good shape.  I feel that the expertise that [facility] has with Dr. Crisoforo Oxford is incredible and that they would be able to give him the best chance at some type of long-term good quality of life.  We will plan to get him back at any time depending on what happens at [place], MD 3/16/20161:51 PM

## 2014-11-04 NOTE — Patient Instructions (Signed)
Abdominal Pain Many things can cause abdominal pain. Usually, abdominal pain is not caused by a disease and will improve without treatment. It can often be observed and treated at home. Your health care provider will do a physical exam and possibly order blood tests and X-rays to help determine the seriousness of your pain. However, in many cases, more time must pass before a clear cause of the pain can be found. Before that point, your health care provider may not know if you need more testing or further treatment. HOME CARE INSTRUCTIONS  Monitor your abdominal pain for any changes. The following actions may help to alleviate any discomfort you are experiencing:  Only take over-the-counter or prescription medicines as directed by your health care provider.  Do not take laxatives unless directed to do so by your health care provider.  Try a clear liquid diet (broth, tea, or water) as directed by your health care provider. Slowly move to a bland diet as tolerated. SEEK MEDICAL CARE IF:  You have unexplained abdominal pain.  You have abdominal pain associated with nausea or diarrhea.  You have pain when you urinate or have a bowel movement.  You experience abdominal pain that wakes you in the night.  You have abdominal pain that is worsened or improved by eating food.  You have abdominal pain that is worsened with eating fatty foods.  You have a fever. SEEK IMMEDIATE MEDICAL CARE IF:   Your pain does not go away within 2 hours.  You keep throwing up (vomiting).  Your pain is felt only in portions of the abdomen, such as the right side or the left lower portion of the abdomen.  You pass bloody or black tarry stools. MAKE SURE YOU:  Understand these instructions.   Will watch your condition.   Will get help right away if you are not doing well or get worse.  Document Released: 05/17/2005 Document Revised: 08/12/2013 Document Reviewed: 04/16/2013 [facility] Patient Information  2015 [place], [place]. This information is not intended to replace advice given to you by your health care provider. Make sure you discuss any questions you have with your health care provider. Dehydration, Adult Dehydration is when you lose more fluids from the body than you take in. Vital organs like the kidneys, brain, and heart cannot function without a proper amount of fluids and salt. Any loss of fluids from the body can cause dehydration.  CAUSES   Vomiting.  Diarrhea.  Excessive sweating.  Excessive urine output.  Fever. SYMPTOMS  Mild dehydration  Thirst.  Dry lips.  Slightly dry mouth. Moderate dehydration  Very dry mouth.  Sunken eyes.  Skin does not bounce back quickly when lightly pinched and released.  Dark urine and decreased urine production.  Decreased tear production.  Headache. Severe dehydration  Very dry mouth.  Extreme thirst.  Rapid, weak pulse (more than 100 beats per minute at rest).  Cold hands and feet.  Not able to sweat in spite of heat and temperature.  Rapid breathing.  Blue lips.  Confusion and lethargy.  Difficulty being awakened.  Minimal urine production.  No tears. DIAGNOSIS  Your caregiver will diagnose dehydration based on your symptoms and your exam. Blood and urine tests will help confirm the diagnosis. The diagnostic evaluation should also identify the cause of dehydration. TREATMENT  Treatment of mild or moderate dehydration can often be done at home by increasing the amount of fluids that you drink. It is best to drink small amounts of fluid  more often. Drinking too much at one time can make vomiting worse. Refer to the home care instructions below. Severe dehydration needs to be treated at the hospital where you will probably be given intravenous (IV) fluids that contain water and electrolytes. HOME CARE INSTRUCTIONS   Ask your caregiver about specific rehydration instructions.  Drink enough fluids to keep  your urine clear or pale yellow.  Drink small amounts frequently if you have nausea and vomiting.  Eat as you normally do.  Avoid:  Foods or drinks high in sugar.  Carbonated drinks.  Juice.  Extremely hot or cold fluids.  Drinks with caffeine.  Fatty, greasy foods.  Alcohol.  Tobacco.  Overeating.  Gelatin desserts.  Wash your hands well to avoid spreading bacteria and viruses.  Only take over-the-counter or prescription medicines for pain, discomfort, or fever as directed by your caregiver.  Ask your caregiver if you should continue all prescribed and over-the-counter medicines.  Keep all follow-up appointments with your caregiver. SEEK MEDICAL CARE IF:  You have abdominal pain and it increases or stays in one area (localizes).  You have a rash, stiff neck, or severe headache.  You are irritable, sleepy, or difficult to awaken.  You are weak, dizzy, or extremely thirsty. SEEK IMMEDIATE MEDICAL CARE IF:   You are unable to keep fluids down or you get worse despite treatment.  You have frequent episodes of vomiting or diarrhea.  You have blood or green matter (bile) in your vomit.  You have blood in your stool or your stool looks black and tarry.  You have not urinated in 6 to 8 hours, or you have only urinated a small amount of very dark urine.  You have a fever.  You faint. MAKE SURE YOU:   Understand these instructions.  Will watch your condition.  Will get help right away if you are not doing well or get worse. Document Released: 08/07/2005 Document Revised: 10/30/2011 Document Reviewed: 03/27/2011 [facility] Patient Information 2015 [place], [place]. This information is not intended to replace advice given to you by your health care provider. Make sure you discuss any questions you have with your health care provider.

## 2014-11-04 NOTE — Progress Notes (Signed)
Patient's chemo held today due to pain, and patient being admitted to hospital per Dr Marin Olp. Dr Marin Olp wanted patient to receive Morphine for pain relief, however patient refused. He slept in our bedroom at infusion center receiving fluids until 1315 when [facility] arrived to transport patient to [facility] for admission.

## 2014-11-12 ENCOUNTER — Other Ambulatory Visit: Payer: Self-pay | Admitting: Family

## 2014-11-16 ENCOUNTER — Ambulatory Visit: Payer: Medicare Other

## 2014-11-16 ENCOUNTER — Other Ambulatory Visit: Payer: Medicare Other

## 2014-11-23 ENCOUNTER — Other Ambulatory Visit: Payer: Medicare Other | Admitting: Lab

## 2014-11-23 ENCOUNTER — Ambulatory Visit: Payer: Medicare Other | Admitting: Hematology & Oncology

## 2014-11-23 ENCOUNTER — Ambulatory Visit: Payer: Medicare Other

## 2014-12-04 ENCOUNTER — Other Ambulatory Visit: Payer: Self-pay | Admitting: Hematology & Oncology

## 2014-12-04 DIAGNOSIS — C182 Malignant neoplasm of ascending colon: Secondary | ICD-10-CM

## 2014-12-08 ENCOUNTER — Other Ambulatory Visit (HOSPITAL_BASED_OUTPATIENT_CLINIC_OR_DEPARTMENT_OTHER): Payer: Medicare Other

## 2014-12-08 ENCOUNTER — Ambulatory Visit (HOSPITAL_BASED_OUTPATIENT_CLINIC_OR_DEPARTMENT_OTHER): Payer: Medicare Other

## 2014-12-08 ENCOUNTER — Ambulatory Visit (HOSPITAL_BASED_OUTPATIENT_CLINIC_OR_DEPARTMENT_OTHER): Payer: Medicare Other | Admitting: Hematology & Oncology

## 2014-12-08 ENCOUNTER — Encounter: Payer: Self-pay | Admitting: Hematology & Oncology

## 2014-12-08 VITALS — BP 117/83 | HR 107 | Temp 98.7°F | Resp 22 | Ht 67.0 in

## 2014-12-08 DIAGNOSIS — C182 Malignant neoplasm of ascending colon: Secondary | ICD-10-CM

## 2014-12-08 DIAGNOSIS — Z5111 Encounter for antineoplastic chemotherapy: Secondary | ICD-10-CM

## 2014-12-08 DIAGNOSIS — F329 Major depressive disorder, single episode, unspecified: Secondary | ICD-10-CM | POA: Diagnosis not present

## 2014-12-08 DIAGNOSIS — F32A Depression, unspecified: Secondary | ICD-10-CM

## 2014-12-08 LAB — CMP (CANCER CENTER ONLY)
ALK PHOS: 85 U/L — AB (ref 26–84)
ALT(SGPT): 15 U/L (ref 10–47)
AST: 18 U/L (ref 11–38)
Albumin: 3.5 g/dL (ref 3.3–5.5)
BILIRUBIN TOTAL: 0.9 mg/dL (ref 0.20–1.60)
BUN, Bld: 10 mg/dL (ref 7–22)
CO2: 30 meq/L (ref 18–33)
Calcium: 9.9 mg/dL (ref 8.0–10.3)
Chloride: 100 mEq/L (ref 98–108)
Creat: 1 mg/dl (ref 0.6–1.2)
GLUCOSE: 108 mg/dL (ref 73–118)
POTASSIUM: 3.4 meq/L (ref 3.3–4.7)
SODIUM: 141 meq/L (ref 128–145)
TOTAL PROTEIN: 8.5 g/dL — AB (ref 6.4–8.1)

## 2014-12-08 LAB — CBC WITH DIFFERENTIAL (CANCER CENTER ONLY)
BASO#: 0 10*3/uL (ref 0.0–0.2)
BASO%: 0.2 % (ref 0.0–2.0)
EOS ABS: 0.2 10*3/uL (ref 0.0–0.5)
EOS%: 2.9 % (ref 0.0–7.0)
HEMATOCRIT: 38 % — AB (ref 38.7–49.9)
HGB: 11.9 g/dL — ABNORMAL LOW (ref 13.0–17.1)
LYMPH#: 2.1 10*3/uL (ref 0.9–3.3)
LYMPH%: 34 % (ref 14.0–48.0)
MCH: 24.6 pg — ABNORMAL LOW (ref 28.0–33.4)
MCHC: 31.3 g/dL — ABNORMAL LOW (ref 32.0–35.9)
MCV: 79 fL — ABNORMAL LOW (ref 82–98)
MONO#: 0.9 10*3/uL (ref 0.1–0.9)
MONO%: 14.9 % — ABNORMAL HIGH (ref 0.0–13.0)
NEUT#: 2.9 10*3/uL (ref 1.5–6.5)
NEUT%: 48 % (ref 40.0–80.0)
Platelets: 371 10*3/uL (ref 145–400)
RBC: 4.84 10*6/uL (ref 4.20–5.70)
RDW: 17.4 % — ABNORMAL HIGH (ref 11.1–15.7)
WBC: 6.1 10*3/uL (ref 4.0–10.0)

## 2014-12-08 MED ORDER — SODIUM CHLORIDE 0.9 % IV SOLN
Freq: Once | INTRAVENOUS | Status: AC
  Administered 2014-12-08: 13:00:00 via INTRAVENOUS
  Filled 2014-12-08: qty 8

## 2014-12-08 MED ORDER — ATROPINE SULFATE 1 MG/ML IJ SOLN
INTRAMUSCULAR | Status: AC
  Filled 2014-12-08: qty 1

## 2014-12-08 MED ORDER — LEUCOVORIN CALCIUM INJECTION 350 MG
206.0000 mg/m2 | Freq: Once | INTRAVENOUS | Status: AC
  Administered 2014-12-08: 440 mg via INTRAVENOUS
  Filled 2014-12-08: qty 22

## 2014-12-08 MED ORDER — SODIUM CHLORIDE 0.9 % IV SOLN
2200.0000 mg/m2 | INTRAVENOUS | Status: DC
  Administered 2014-12-08: 4700 mg via INTRAVENOUS
  Filled 2014-12-08: qty 94

## 2014-12-08 MED ORDER — ATROPINE SULFATE 1 MG/ML IJ SOLN
0.5000 mg | Freq: Once | INTRAMUSCULAR | Status: AC | PRN
  Administered 2014-12-08: 0.5 mg via INTRAVENOUS

## 2014-12-08 MED ORDER — DEXTROSE 5 % IV SOLN
Freq: Once | INTRAVENOUS | Status: AC
  Administered 2014-12-08: 12:00:00 via INTRAVENOUS

## 2014-12-08 MED ORDER — IRINOTECAN HCL CHEMO INJECTION 100 MG/5ML
168.0000 mg/m2 | Freq: Once | INTRAVENOUS | Status: AC
  Administered 2014-12-08: 360 mg via INTRAVENOUS
  Filled 2014-12-08: qty 6

## 2014-12-08 NOTE — Patient Instructions (Signed)
[place] Discharge Instructions for Patients Receiving Chemotherapy  Today you received the following chemotherapy agents Camptosar, Leucovorin and 5FU.  To help prevent nausea and vomiting after your treatment, we encourage you to take your nausea medication.   If you develop nausea and vomiting that is not controlled by your nausea medication, call the clinic.   BELOW ARE SYMPTOMS THAT SHOULD BE REPORTED IMMEDIATELY:  *FEVER GREATER THAN 100.5 F  *CHILLS WITH OR WITHOUT FEVER  NAUSEA AND VOMITING THAT IS NOT CONTROLLED WITH YOUR NAUSEA MEDICATION  *UNUSUAL SHORTNESS OF BREATH  *UNUSUAL BRUISING OR BLEEDING  TENDERNESS IN MOUTH AND THROAT WITH OR WITHOUT PRESENCE OF ULCERS  *URINARY PROBLEMS  *BOWEL PROBLEMS  UNUSUAL RASH Items with * indicate a potential emergency and should be followed up as soon as possible.  Feel free to call the clinic you have any questions or concerns. The clinic phone number is [phone].  Please show the [place] at check-in to the Emergency Department and triage nurse.

## 2014-12-09 LAB — PREALBUMIN: Prealbumin: 12 mg/dL — ABNORMAL LOW (ref 21–43)

## 2014-12-09 LAB — LACTATE DEHYDROGENASE: LDH: 133 U/L (ref 94–250)

## 2014-12-09 LAB — CEA: CEA: 49.5 ng/mL — AB (ref 0.0–5.0)

## 2014-12-09 MED ORDER — ESCITALOPRAM OXALATE 20 MG PO TABS: 20.0000 mg | ORAL_TABLET | Freq: Every day | ORAL | Status: DC

## 2014-12-09 NOTE — Progress Notes (Signed)
Hematology and Oncology Follow Up Visit  David Conrad [id number] September 30, 1970 44 y.o. 12/09/2014   Principle Diagnosis:   Metastatic colon cancer-KRAS (+)  Current Therapy:    Observation     Interim History:  David Conrad is back for treatment. Unfortunately, he still is not doing well. A lot of what is going on with him is emotional. He just is incredibly depressed.  He was also set up for surgery at [facility]. He canceled. He says that he is not mentally ready for surgery.  I talked to him for almost an hour today. Every time we see him, he is incredibly depressed. I told him that surgery is the best option for him. Surgery is the best way of trying to give him the longest quality of life and the best quality of life. I understand that it is aggressive surgery and that recovery could be difficult but he could easily get through it.  He just is so emotionally ambiguous. He has a lot of issues at home.  It is odyssey needs antidepressant. He saw one of the psychiatrist at [facility]. I'm not sure why they did not put him on an antidepressant.  He's having more issues with abdominal pain. I told that this is because of the tumors.  I told him that chemotherapy would have little chance of helping him. However, he just cannot mentally get through surgery right now.  Again, I told him clearly that if he does not can surgery, he will not survive this. I told him that the longer he waits, the less chance surgery has of helping him.  He seems understand this but yet he just is not mentally capable of getting through surgery.  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 He does not want oxaliplatin. Again, this will compromise David Conrad surgery can help him.  I cannot give him Avastin to cause I am hoping that he will change his mind and get surgery.  Overall, his performance status is ECOG 1.          Medications:  Current outpatient prescriptions:  .  ALPRAZolam (XANAX) 1 MG tablet, Take  1 tablet every 8 hour, IF NEEDED, for anxiety., Disp: 60 tablet, Rfl: 1 .  DM-Doxylamine-Acetaminophen (NYQUIL COLD & FLU PO), Take 1 tablet by mouth 2 (two) times daily as needed (cold symptoms). , Disp: , Rfl:  .  ibuprofen (ADVIL,MOTRIN) 200 MG tablet, Take 200 mg by mouth every 6 (six) hours as needed for moderate pain (pain). , Disp: , Rfl:  .  lactulose (CHRONULAC) 10 GM/15ML solution, Take 30 mLs (20 g total) by mouth daily as needed for mild constipation., Disp: 240 mL, Rfl: 0 .  lidocaine-prilocaine (EMLA) cream, Apply topically as needed., Disp: 30 g, Rfl: 3 .  loperamide (IMODIUM A-D) 2 MG tablet, Take 2 at onset of diarrhea, then 1 every 2hrs until 12hr without a BM. May take 2 tab every 4hrs at bedtime. If diarrhea recurs repeat., Disp: 100 tablet, Rfl: 1 .  LORazepam (ATIVAN) 0.5 MG tablet, Take 0.5 mg by mouth at bedtime., Disp: , Rfl:  .  morphine (MSIR) 15 MG tablet, Take 15 mg by mouth every 6 (six) hours as needed for severe pain., Disp: , Rfl:  .  ondansetron (ZOFRAN) 8 MG tablet, Take by mouth every 8 (eight) hours as needed for nausea or vomiting., Disp: , Rfl:  .  polyethylene glycol (MIRALAX / GLYCOLAX) packet, Take 17 g by mouth daily., Disp: 14 each, Rfl: 0 .  Pyridoxine  HCl (VITAMIN B-6) 250 MG tablet, Take 1 tablet (250 mg total) by mouth daily. (Patient not taking: Reported on 12/08/2014), Disp: 30 tablet, Rfl: 6 No current facility-administered medications for this visit.  Facility-Administered Medications Ordered in Other Visits:  .  dextrose 5 % solution, , Intravenous, Once, David Napoleon, MD .  sodium chloride 0.9 % injection 10 mL, 10 mL, Intracatheter, PRN, David Napoleon, MD, 10 mL at 11/02/14 1430  Allergies:  Allergies  Allergen Reactions  . Morphine     Other reaction(s): Dizziness (intolerance)    Past Medical History, Surgical history, Social history, and Family History were reviewed and updated.  Review of Systems: As above  Physical Exam:   height is 5' 7"  (1.702 m). His oral temperature is 98.7 F (37.1 C). His blood pressure is 117/83 and his pulse is 107. His respiration is 22.   Wt Readings from Last 3 Encounters:  10/26/14 214 lb (97.07 kg)  10/09/14 221 lb 1.9 oz (100.3 kg)  09/23/14 228 lb (103.42 kg)     Well-developed and well-nourished white gentleman in no obvious distress. Head and neck exam shows no ocular or oral lesions. There are no palpable cervical or supraclavicular lymph nodes. Lungs are clear. Cardiac exam regular rate and rhythm with no murmurs, rubs or bruits. Abdomen is soft. It's not as distended. He has Decentowel sounds. There is some slight tenderness over the right side. There is some slight fullness on the right side. There is no palpable liver or spleen tip. Back exam shows no tenderness over the spine, ribs or hips. Extremities shows no clubbing, cyanosis or edema. Skin exam no rashes, ecchymoses or petechia. Neurological exam shows no focal neurological deficits.  Lab Results  Component Value Date   WBC 6.1 12/08/2014   HGB 11.9* 12/08/2014   HCT 38.0* 12/08/2014   MCV 79* 12/08/2014   PLT 371 12/08/2014     Chemistry      Component Value Date/Time   NA 141 12/08/2014 0912   NA 136 10/13/2014 0543   K 3.4 12/08/2014 0912   K 3.2* 10/13/2014 0543   CL 100 12/08/2014 0912   CL 99 10/13/2014 0543   CO2 30 12/08/2014 0912   CO2 28 10/13/2014 0543   BUN 10 12/08/2014 0912   BUN 12 10/13/2014 0543   CREATININE 1.0 12/08/2014 0912   CREATININE 0.95 10/13/2014 0543      Component Value Date/Time   CALCIUM 9.9 12/08/2014 0912   CALCIUM 8.8 10/13/2014 0543   ALKPHOS 85* 12/08/2014 0912   ALKPHOS 73 10/13/2014 0543   AST 18 12/08/2014 0912   AST 62* 10/13/2014 0543   ALT 15 12/08/2014 0912   ALT 86* 10/13/2014 0543   BILITOT 0.90 12/08/2014 0912   BILITOT 0.9 10/13/2014 0543         Impression and Plan: David Conrad is 44 year old gentleman. He has progressive metastatic colon  cancer.  His cancer is K-RAS positive. We cannot use EGFR inhibitors.  We will have to see what chemotherapy can do for him.  It would not surprise me if he has a back in the hospital with bowel obstruction at some point.  Again, he understands full well that my recommendation for him is surgery. I have told him on several occasions that I think that his best chance will be surgery and that he can get through surgery. I will try to get him on some Lexapro or Celexa.  Again, I spent about an hour  with him today. He was just an emotional "wreck". He was crying a lot. He just cannot emotionally be able to have surgery right now. I worried that he never is going to have surgery and that he will have a bowel obstruction that we will not be able to correct. And I have told him this. And I have told him this on several occasions. I have told him this as he was in the hospital with bowel obstruction. David Napoleon, MD 4/20/20167:40 AM

## 2014-12-10 ENCOUNTER — Ambulatory Visit (HOSPITAL_BASED_OUTPATIENT_CLINIC_OR_DEPARTMENT_OTHER)

## 2014-12-10 VITALS — BP 120/83 | HR 96 | Temp 97.6°F | Resp 20 | Wt 200.8 lb

## 2014-12-10 DIAGNOSIS — C182 Malignant neoplasm of ascending colon: Secondary | ICD-10-CM | POA: Diagnosis not present

## 2014-12-10 MED ORDER — HEPARIN SOD (PORK) LOCK FLUSH 100 UNIT/ML IV SOLN
500.0000 [IU] | Freq: Once | INTRAVENOUS | Status: AC | PRN
  Administered 2014-12-10: 500 [IU]
  Filled 2014-12-10: qty 5

## 2014-12-10 MED ORDER — SODIUM CHLORIDE 0.9 % IJ SOLN
10.0000 mL | INTRAMUSCULAR | Status: DC | PRN
  Administered 2014-12-10: 10 mL
  Filled 2014-12-10: qty 10

## 2014-12-10 NOTE — Patient Instructions (Signed)

## 2014-12-12 ENCOUNTER — Inpatient Hospital Stay (HOSPITAL_COMMUNITY)
Admission: EM | Admit: 2014-12-12 | Discharge: 2014-12-18 | DRG: 375 | Disposition: A | Discharge status: Home / Self Care | Payer: Medicare Other | Attending: Internal Medicine | Admitting: Internal Medicine

## 2014-12-12 ENCOUNTER — Encounter (HOSPITAL_COMMUNITY): Payer: Self-pay | Admitting: *Deleted

## 2014-12-12 ENCOUNTER — Inpatient Hospital Stay (HOSPITAL_COMMUNITY): Payer: Medicare Other

## 2014-12-12 ENCOUNTER — Emergency Department (HOSPITAL_COMMUNITY): Payer: Medicare Other

## 2014-12-12 DIAGNOSIS — Z79899 Other long term (current) drug therapy: Secondary | ICD-10-CM | POA: Diagnosis not present

## 2014-12-12 DIAGNOSIS — R197 Diarrhea, unspecified: Secondary | ICD-10-CM | POA: Diagnosis not present

## 2014-12-12 DIAGNOSIS — K56600 Partial intestinal obstruction, unspecified as to cause: Secondary | ICD-10-CM

## 2014-12-12 DIAGNOSIS — R1111 Vomiting without nausea: Secondary | ICD-10-CM | POA: Diagnosis not present

## 2014-12-12 DIAGNOSIS — R109 Unspecified abdominal pain: Secondary | ICD-10-CM

## 2014-12-12 DIAGNOSIS — K567 Ileus, unspecified: Secondary | ICD-10-CM | POA: Diagnosis present

## 2014-12-12 DIAGNOSIS — F419 Anxiety disorder, unspecified: Secondary | ICD-10-CM | POA: Diagnosis present

## 2014-12-12 DIAGNOSIS — C799 Secondary malignant neoplasm of unspecified site: Secondary | ICD-10-CM

## 2014-12-12 DIAGNOSIS — R1084 Generalized abdominal pain: Secondary | ICD-10-CM | POA: Diagnosis not present

## 2014-12-12 DIAGNOSIS — J9811 Atelectasis: Secondary | ICD-10-CM | POA: Diagnosis present

## 2014-12-12 DIAGNOSIS — D509 Iron deficiency anemia, unspecified: Secondary | ICD-10-CM | POA: Diagnosis present

## 2014-12-12 DIAGNOSIS — C786 Secondary malignant neoplasm of retroperitoneum and peritoneum: Secondary | ICD-10-CM | POA: Diagnosis present

## 2014-12-12 DIAGNOSIS — E876 Hypokalemia: Secondary | ICD-10-CM | POA: Diagnosis present

## 2014-12-12 DIAGNOSIS — R112 Nausea with vomiting, unspecified: Secondary | ICD-10-CM | POA: Diagnosis not present

## 2014-12-12 DIAGNOSIS — C189 Malignant neoplasm of colon, unspecified: Secondary | ICD-10-CM | POA: Diagnosis present

## 2014-12-12 DIAGNOSIS — C787 Secondary malignant neoplasm of liver and intrahepatic bile duct: Secondary | ICD-10-CM | POA: Diagnosis present

## 2014-12-12 DIAGNOSIS — Z885 Allergy status to narcotic agent status: Secondary | ICD-10-CM | POA: Diagnosis not present

## 2014-12-12 DIAGNOSIS — F329 Major depressive disorder, single episode, unspecified: Secondary | ICD-10-CM | POA: Diagnosis present

## 2014-12-12 DIAGNOSIS — Z9049 Acquired absence of other specified parts of digestive tract: Secondary | ICD-10-CM | POA: Diagnosis present

## 2014-12-12 DIAGNOSIS — G4700 Insomnia, unspecified: Secondary | ICD-10-CM | POA: Diagnosis present

## 2014-12-12 DIAGNOSIS — K56609 Unspecified intestinal obstruction, unspecified as to partial versus complete obstruction: Secondary | ICD-10-CM | POA: Diagnosis present

## 2014-12-12 DIAGNOSIS — T451X5A Adverse effect of antineoplastic and immunosuppressive drugs, initial encounter: Secondary | ICD-10-CM | POA: Diagnosis present

## 2014-12-12 DIAGNOSIS — Z79891 Long term (current) use of opiate analgesic: Secondary | ICD-10-CM

## 2014-12-12 DIAGNOSIS — C182 Malignant neoplasm of ascending colon: Secondary | ICD-10-CM | POA: Diagnosis present

## 2014-12-12 DIAGNOSIS — F0631 Mood disorder due to known physiological condition with depressive features: Secondary | ICD-10-CM | POA: Diagnosis present

## 2014-12-12 DIAGNOSIS — R110 Nausea: Secondary | ICD-10-CM

## 2014-12-12 LAB — CBC WITH DIFFERENTIAL/PLATELET
BASOS PCT: 0 % (ref 0–1)
Basophils Absolute: 0 10*3/uL (ref 0.0–0.1)
EOS ABS: 0 10*3/uL (ref 0.0–0.7)
Eosinophils Relative: 0 % (ref 0–5)
HCT: 37.9 % — ABNORMAL LOW (ref 39.0–52.0)
HEMOGLOBIN: 11.9 g/dL — AB (ref 13.0–17.0)
Lymphocytes Relative: 22 % (ref 12–46)
Lymphs Abs: 1.3 10*3/uL (ref 0.7–4.0)
MCH: 24.3 pg — ABNORMAL LOW (ref 26.0–34.0)
MCHC: 31.4 g/dL (ref 30.0–36.0)
MCV: 77.5 fL — ABNORMAL LOW (ref 78.0–100.0)
Monocytes Absolute: 0.1 10*3/uL (ref 0.1–1.0)
Monocytes Relative: 1 % — ABNORMAL LOW (ref 3–12)
NEUTROS ABS: 4.4 10*3/uL (ref 1.7–7.7)
NEUTROS PCT: 77 % (ref 43–77)
Platelets: 366 10*3/uL (ref 150–400)
RBC: 4.89 MIL/uL (ref 4.22–5.81)
RDW: 17 % — ABNORMAL HIGH (ref 11.5–15.5)
WBC: 5.8 10*3/uL (ref 4.0–10.5)

## 2014-12-12 LAB — URINALYSIS, ROUTINE W REFLEX MICROSCOPIC
Bilirubin Urine: NEGATIVE
GLUCOSE, UA: NEGATIVE mg/dL
Hgb urine dipstick: NEGATIVE
Ketones, ur: 15 mg/dL — AB
LEUKOCYTES UA: NEGATIVE
NITRITE: NEGATIVE
Protein, ur: NEGATIVE mg/dL
Urobilinogen, UA: 0.2 mg/dL (ref 0.0–1.0)
pH: 6 (ref 5.0–8.0)

## 2014-12-12 LAB — COMPREHENSIVE METABOLIC PANEL
ALBUMIN: 3.6 g/dL (ref 3.5–5.2)
ALT: 92 U/L — AB (ref 0–53)
AST: 56 U/L — ABNORMAL HIGH (ref 0–37)
Alkaline Phosphatase: 93 U/L (ref 39–117)
Anion gap: 10 (ref 5–15)
BUN: 19 mg/dL (ref 6–23)
CHLORIDE: 104 mmol/L (ref 96–112)
CO2: 23 mmol/L (ref 19–32)
CREATININE: 0.87 mg/dL (ref 0.50–1.35)
Calcium: 9.3 mg/dL (ref 8.4–10.5)
GFR calc non Af Amer: >90 mL/min (ref >90)
Glucose, Bld: 136 mg/dL — ABNORMAL HIGH (ref 70–99)
Potassium: 3.4 mmol/L — ABNORMAL LOW (ref 3.5–5.1)
Sodium: 137 mmol/L (ref 135–145)
TOTAL PROTEIN: 7.7 g/dL (ref 6.0–8.3)
Total Bilirubin: 0.7 mg/dL (ref 0.3–1.2)

## 2014-12-12 LAB — LIPASE, BLOOD: Lipase: 22 U/L (ref 11–59)

## 2014-12-12 LAB — CLOSTRIDIUM DIFFICILE BY PCR: CDIFFPCR: NEGATIVE

## 2014-12-12 MED ORDER — ACETAMINOPHEN 325 MG PO TABS: 650.0000 mg | ORAL_TABLET | Freq: Four times a day (QID) | ORAL | Status: DC | PRN

## 2014-12-12 MED ORDER — HYDROMORPHONE HCL 1 MG/ML IJ SOLN
1.0000 mg | Freq: Once | INTRAMUSCULAR | Status: AC
  Administered 2014-12-12: 1 mg via INTRAVENOUS
  Filled 2014-12-12: qty 1

## 2014-12-12 MED ORDER — VITAMIN B-6 50 MG PO TABS
250.0000 mg | ORAL_TABLET | Freq: Every day | ORAL | Status: DC
  Administered 2014-12-13 – 2014-12-18 (×6): 250 mg via ORAL
  Filled 2014-12-12 (×7): qty 1

## 2014-12-12 MED ORDER — HYDROMORPHONE HCL 1 MG/ML IJ SOLN
1.0000 mg | INTRAMUSCULAR | Status: DC | PRN
  Administered 2014-12-12 – 2014-12-18 (×9): 1 mg via INTRAVENOUS
  Filled 2014-12-12 (×10): qty 1

## 2014-12-12 MED ORDER — POTASSIUM CHLORIDE IN NACL 20-0.9 MEQ/L-% IV SOLN
INTRAVENOUS | Status: DC
  Administered 2014-12-12: 1000 mL via INTRAVENOUS
  Administered 2014-12-13: 04:00:00 via INTRAVENOUS
  Administered 2014-12-14: 75 mL/h via INTRAVENOUS
  Administered 2014-12-17 – 2014-12-18 (×2): via INTRAVENOUS
  Filled 2014-12-12 (×9): qty 1000

## 2014-12-12 MED ORDER — IOHEXOL 300 MG/ML  SOLN
50.0000 mL | Freq: Once | INTRAMUSCULAR | Status: AC | PRN
  Administered 2014-12-12: 50 mL via ORAL

## 2014-12-12 MED ORDER — ONDANSETRON HCL 4 MG PO TABS: 4.0000 mg | ORAL_TABLET | Freq: Four times a day (QID) | ORAL | Status: DC | PRN

## 2014-12-12 MED ORDER — MORPHINE SULFATE ER 30 MG PO TBCR
30.0000 mg | EXTENDED_RELEASE_TABLET | Freq: Two times a day (BID) | ORAL | Status: DC
  Administered 2014-12-12 – 2014-12-18 (×12): 30 mg via ORAL
  Filled 2014-12-12 (×13): qty 1

## 2014-12-12 MED ORDER — IOHEXOL 300 MG/ML  SOLN
100.0000 mL | Freq: Once | INTRAMUSCULAR | Status: AC | PRN
  Administered 2014-12-12: 100 mL via INTRAVENOUS

## 2014-12-12 MED ORDER — ONDANSETRON HCL 4 MG/2ML IJ SOLN: 4.0000 mg | Freq: Three times a day (TID) | INTRAMUSCULAR | Status: DC | PRN

## 2014-12-12 MED ORDER — SODIUM CHLORIDE 0.9 % IV SOLN
1000.0000 mL | Freq: Once | INTRAVENOUS | Status: AC
  Administered 2014-12-12: 1000 mL via INTRAVENOUS

## 2014-12-12 MED ORDER — ONDANSETRON HCL 4 MG/2ML IJ SOLN
4.0000 mg | Freq: Four times a day (QID) | INTRAMUSCULAR | Status: DC | PRN
Start: 2014-12-12 — End: 2014-12-18
  Administered 2014-12-12 – 2014-12-16 (×5): 4 mg via INTRAVENOUS
  Filled 2014-12-12 (×5): qty 2

## 2014-12-12 MED ORDER — ESCITALOPRAM OXALATE 20 MG PO TABS
20.0000 mg | ORAL_TABLET | Freq: Every day | ORAL | Status: DC
  Administered 2014-12-13 – 2014-12-18 (×6): 20 mg via ORAL
  Filled 2014-12-12 (×7): qty 1

## 2014-12-12 MED ORDER — ONDANSETRON HCL 4 MG/2ML IJ SOLN
4.0000 mg | Freq: Once | INTRAMUSCULAR | Status: AC
  Administered 2014-12-12: 4 mg via INTRAVENOUS
  Filled 2014-12-12: qty 2

## 2014-12-12 MED ORDER — ACETAMINOPHEN 650 MG RE SUPP: 650.0000 mg | Freq: Four times a day (QID) | RECTAL | Status: DC | PRN

## 2014-12-12 MED ORDER — LORAZEPAM 2 MG/ML IJ SOLN
0.5000 mg | Freq: Four times a day (QID) | INTRAMUSCULAR | Status: DC | PRN
  Administered 2014-12-12 – 2014-12-13 (×4): 0.5 mg via INTRAVENOUS
  Filled 2014-12-12 (×4): qty 1

## 2014-12-12 MED ORDER — SODIUM CHLORIDE 0.9 % IV SOLN
INTRAVENOUS | Status: DC
  Administered 2014-12-12: 11:00:00 via INTRAVENOUS

## 2014-12-12 MED ORDER — SODIUM CHLORIDE 0.9 % IV BOLUS (SEPSIS)
1000.0000 mL | Freq: Once | INTRAVENOUS | Status: AC
  Administered 2014-12-12: 1000 mL via INTRAVENOUS

## 2014-12-12 NOTE — Consult Note (Signed)
Reason for Consult:nausea/vomiting/ progression of tumor Referring Physician: Dr   Westley [name] is an 44 y.o. male.  HPI: David Conrad is a 44 y.o. male. With T3N2M0 Stage 3c colon cancer +6LNs, Kras neg s/p Laparoscoic right hemicolectomy 05/2013 and 12 cycles of FOLFOX and recurrence noted in 06/2013 he has been on 5FU & avastin until early 2016 switched to [place] with worsening pain.    He was diagnosed in October of 2013, with a mass in the ascending colon via a colonoscopy. This was biopsy proved to be adenocarcinoma. On 06/20/13 he was taken to the OR in [facility], where he underwent a laparoscopic right hemicolectomy. Pathology was resulted as T3 N2 M0 (stage III C) colon cancer, 6/ lymph nodes positive, KRAS negative. He consequently underwent 12 cycles of FOLFOX which he completed in 06/14. He tolerated chemotherapy very well except for some neuropathy in bilateral feet. He was being followed by his local oncologist Dr. Marin Olp, with 6 monthly imaging and serial CEA levels. He was noted to be recurrence free until a CT scan was performed for abdominal pain on 07/10/13 that showed a new 2.8 x 4.6 cm soft tissue mass on the right side of the abdomen intimately associated with the overlying abdominal wall musculature and several adjacent small bowel loops, concerning for a local recurrence or a mesenteric implant. A 1 cm lymph node was identified in the small bowel mesentery, and an additional 1.6 cm x 4.2 cm soft tissue mass was identified in the small bowel mesentery which was suggestive of mesenteric implant versus a lymph node. There was also a similar appearing soft tissue lesion 1.4 x 1.5 cm in the right lower quadrant. Several small hypodense liver lesions compatible with simple cysts were identified in the right lobe of the liver. He underwent CT guided biopsy of one of the peritoneal nodules that was positive for adenocarcinoma. He underwent a PET scan on 07/28/13 that showed the  nodules noted on the CT scan to be hypermetabolic. His most recent Ct scan done at [facility] on 08/08/13 shows increase in size of the peritoneal lesions indicating progression of disease. There were several small hypoattenuating lesions noted in the liver of indeterminate significance. He is here to discuss further evaluation and management of his metastatic disease.  He was maintained on 5-FU/Avastatin for most of the past two years until about 3 months ago, when he was switched to [employer]. He was recently admitted to [facility] surgical service with a small bowel obstruction on 11/04/2014, which resolved with conservative management the next day and he was discharged.   He was seen in Dr Lianne Moris clinic very depressed and crying in early April.  He was scheduled for cytoreductive surgery and HIPEC with Dr Bridgett Larsson at [facility] on 4/18 bc he wasn't mentally ready for surgery.   He saw Dr Marin Olp on 4/20 for f/u and was very emotional and tearful. He recommended surgery as only sort of best option but warned him time was running out on making a decision as tumor was progressing.   He last received his chemotherapy on April 19. His symptoms started on Thursday, about 2 days afterwards. He mentions nausea and vomiting along with abdominal pain and diarrhea. He's had the numerous episodes of emesis with yellowish and greenish fluid. Denies any blood in the emesis. He had a few episodes of loose stool this morning, some of which had small amount of blood. Abdominal pain is mostly in the lower abdomen which is mainly  due to his cancer. The pain got worse on Thursday once he started vomiting. The pain is a sharp pain 6 out of 10 in intensity without radiation. He denies any fever. He has had chills. He lost about 14 pounds in the last 2 weeks. Has had decreased activity level. No syncopal episodes. Hasn't had any vomiting since he has been in the emergency department. Abdominal x-ray raised the possibility of a small bowel  obstruction. CT was done which showed no SBO but tumor necrosis vs tumor fistulization to small bowel. We were asked to see.   Past Medical History  Diagnosis Date  . Anemia   . Cancer 10/13 and 2015    colon ca  . History of colon resection     Past Surgical History  Procedure Laterality Date  . Colonoscopy  06/17/2012    Procedure: COLONOSCOPY;  Surgeon: Juanita Craver, MD;  Location: [facility] ENDOSCOPY;  Service: Endoscopy;  Laterality: N/A;  . Colon resection  06/19/2012    Procedure: COLON RESECTION LAPAROSCOPIC;  Surgeon: Ralene Ok, MD;  Location: [place];  Service: General;  Laterality: Right;  . Colostomy revision  06/19/2012    Procedure: COLON RESECTION RIGHT;  Surgeon: Ralene Ok, MD;  Location: [place];  Service: General;  Laterality: Right;    Family History  Problem Relation Age of Onset  . Alcohol abuse Father   . Colon cancer Maternal Grandfather     Social History:  reports that he quit smoking about 16 months ago. His smoking use included Cigarettes. He started smoking about 17 years ago. He has a 7.5 pack-year smoking history. He has never used smokeless tobacco. He reports that he does not drink alcohol or use illicit drugs.  Allergies:  Allergies  Allergen Reactions  . Morphine Other (See Comments)    Other reaction(s): Dizziness (intolerance)    Medications: reviewed  Results for orders placed or performed during the hospital encounter of 12/12/14 (from the past 48 hour(s))  CBC with Differential     Status: Abnormal   Collection Time: 12/12/14  9:11 AM  Result Value Ref Range   WBC 5.8 4.0 - 10.5 K/uL   RBC 4.89 4.22 - 5.81 MIL/uL   Hemoglobin 11.9 (L) 13.0 - 17.0 g/dL   HCT 37.9 (L) 39.0 - 52.0 %   MCV 77.5 (L) 78.0 - 100.0 fL   MCH 24.3 (L) 26.0 - 34.0 pg   MCHC 31.4 30.0 - 36.0 g/dL   RDW 17.0 (H) 11.5 - 15.5 %   Platelets 366 150 - 400 K/uL   Neutrophils Relative % 77 43 - 77 %   Neutro Abs 4.4 1.7 - 7.7 K/uL   Lymphocytes Relative 22 12 -  46 %   Lymphs Abs 1.3 0.7 - 4.0 K/uL   Monocytes Relative 1 (L) 3 - 12 %   Monocytes Absolute 0.1 0.1 - 1.0 K/uL   Eosinophils Relative 0 0 - 5 %   Eosinophils Absolute 0.0 0.0 - 0.7 K/uL   Basophils Relative 0 0 - 1 %   Basophils Absolute 0.0 0.0 - 0.1 K/uL  Comprehensive metabolic panel     Status: Abnormal   Collection Time: 12/12/14  9:11 AM  Result Value Ref Range   Sodium 137 135 - 145 mmol/L   Potassium 3.4 (L) 3.5 - 5.1 mmol/L   Chloride 104 96 - 112 mmol/L   CO2 23 19 - 32 mmol/L   Glucose, Bld 136 (H) 70 - 99 mg/dL   BUN  19 6 - 23 mg/dL   Creatinine, Ser 0.87 0.50 - 1.35 mg/dL   Calcium 9.3 8.4 - 10.5 mg/dL   Total Protein 7.7 6.0 - 8.3 g/dL   Albumin 3.6 3.5 - 5.2 g/dL   AST 56 (H) 0 - 37 U/L   ALT 92 (H) 0 - 53 U/L   Alkaline Phosphatase 93 39 - 117 U/L   Total Bilirubin 0.7 0.3 - 1.2 mg/dL   GFR calc non Af Amer >90 >90 mL/min   GFR calc Af Amer >90 >90 mL/min    Comment: (NOTE) The eGFR has been calculated using the CKD EPI equation. This calculation has not been validated in all clinical situations. eGFR's persistently <90 mL/min signify possible Chronic Kidney Disease.    Anion gap 10 5 - 15  Lipase, blood     Status: None   Collection Time: 12/12/14  9:11 AM  Result Value Ref Range   Lipase 22 11 - 59 U/L  Urinalysis, Routine w reflex microscopic     Status: Abnormal   Collection Time: 12/12/14  1:11 PM  Result Value Ref Range   Color, Urine YELLOW YELLOW   APPearance CLEAR CLEAR   Specific Gravity, Urine >1.030 (H) 1.005 - 1.030   pH 6.0 5.0 - 8.0   Glucose, UA NEGATIVE NEGATIVE mg/dL   Hgb urine dipstick NEGATIVE NEGATIVE   Bilirubin Urine NEGATIVE NEGATIVE   Ketones, ur 15 (A) NEGATIVE mg/dL   Protein, ur NEGATIVE NEGATIVE mg/dL   Urobilinogen, UA 0.2 0.0 - 1.0 mg/dL   Nitrite NEGATIVE NEGATIVE   Leukocytes, UA NEGATIVE NEGATIVE    Comment: MICROSCOPIC NOT DONE ON URINES WITH NEGATIVE PROTEIN, BLOOD, LEUKOCYTES, NITRITE, OR GLUCOSE <1000 mg/dL.     Ct Abdomen Pelvis W Contrast  12/12/2014   CLINICAL DATA:  Colon cancer. Nausea and vomiting. Vomiting and diarrhea with onset of symptoms on Thursday. Chemotherapy on Thursday. Intractable vomiting.  EXAM: CT ABDOMEN AND PELVIS WITH CONTRAST  TECHNIQUE: Multidetector CT imaging of the abdomen and pelvis was performed using the standard protocol following bolus administration of intravenous contrast.  CONTRAST:  85m OMNIPAQUE IOHEXOL 300 MG/ML SOLN, 1029mOMNIPAQUE IOHEXOL 300 MG/ML SOLN  COMPARISON:  10/07/2014.  FINDINGS: Musculoskeletal: RIGHT sacroiliac joint ankylosis. No aggressive osseous lesion. Benign bone island in the RIGHT ischium.  Lung Bases: Mild dependent atelectasis and/or scarring. Little interval change. No airspace consolidation in this patient undergoing chemotherapy.  Liver: Subcapsular RIGHT hepatic lobe metastases with probable small mucinous metastasis in the lateral RIGHT hepatic lobe. Tiny low-density lesions in the RIGHT hepatic dome are nonspecific. Overall, the appearance of the liver is unchanged compared 04/09/2014. No new metastatic disease.  Spleen:  Normal.  Gallbladder:  Normal.  Common bile duct:  Normal.  Pancreas:  Normal.  Adrenal glands:  Normal bilaterally.  Kidneys: Normal enhancement. Delayed excretion of contrast from the kidneys is normal. Both ureters are within normal limits.  Stomach: Proximal stomach is collapsed. No mural thickening or mural mass lesion.  Small bowel: Duodenum appears normal. Mild distension of small bowel at the duodenal jejunal junction without obstruction. Distal small bowel appears collapsed and there is no obstruction leading to the ileocolonic anastomosis.  Colon: RIGHT hemicolectomy. Anastomotic staple line is present in the central abdomen (image 37 series 2). The distal colon is mostly collapsed. No mass lesion.  Pelvic Genitourinary:  Urinary bladder appears normal.  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 Compared to the  prior exam, these now demonstrates small locules of gas, which may represent either fistulization with small bowel or necrosis. Abscess is difficult to completely exclude based on the presence of gas which was not on the prior CT 10/07/2014.  Unchanged rectovesical implant. Small implants in the LEFT pericolic gutter near the splenic flexure appears slightly larger than on the prior exam of 10/07/2014. In aggregate, these 2 nodules measures 17 mm x 12 mm.  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  Contrast is present in the small bowel but has not yet reached the residual colon. This makes assessment of fistula difficult. A reasonable option would be to return the patient for CT scanning in 12 hours to see if any oral contrast is present in the metastatic lesion that would indicate fistulization. Given the advanced disease, this decision should be made based on whether it will affect clinical management.  Vasculature: Pulmonary vessels at the lung bases appear within normal limits. No acute vascular abnormality.  Body Wall: Metastatic implants are present along the surgical incision in the midline. The largest of these has increased from February 2016, measuring 20 mm x 35 mm (image 41 series 2). Unchanged fat containing RIGHT inguinal hernia.  IMPRESSION: 1. Negative for bowel obstruction. Minimal increase in mucinous colon cancer metastatic burden in the peritoneum, largest in the RIGHT hemicolectomy bed. 2. In the largest peritoneal lesion in the RIGHT abdomen, locules of gas have developed within the tumor. The differential considerations are fistulization with small bowel or tumor necrosis with pockets of gas. Although abscess is a possibility, the relative lack of change in the configuration compared to  the prior exam from February makes an abscess less likely. 3. Stable liver metastases.   Electronically Signed   By: Dereck Ligas M.D.   On: 12/12/2014 13:45   Dg Abd Acute W/chest  12/12/2014   CLINICAL DATA:  Abdominal pain, nausea, vomiting, fever for 3 days. Colon cancer.  EXAM: DG ABDOMEN ACUTE W/ 1V CHEST  COMPARISON:  11/02/2014  FINDINGS: Very low lung volumes with bibasilar atelectasis. Stable right jugular Port-A-Cath. Prominent cardiac silhouette likely due to low volumes. No pneumothorax.  No free intraperitoneal gas. Nonspecific right upper quadrant air-fluid levels. There is 1 distended small bowel loop in the left upper quadrant. Postoperative changes with bowel staples are noted  IMPRESSION: There is 1 dilated small bowel loop associated with air-fluid levels. Developing partial small bowel obstruction is not excluded.  No free intraperitoneal gas  Bibasilar atelectasis.   Electronically Signed   By: Marybelle Killings M.D.   On: 12/12/2014 10:15    Review of Systems  Constitutional: Positive for chills and weight loss. Negative for fever.  HENT: Negative for congestion and nosebleeds.   Eyes: Negative for blurred vision and double vision.  Respiratory: Negative for cough and shortness of breath.   Cardiovascular: Negative for chest pain, palpitations and orthopnea.  Gastrointestinal: Positive for nausea, vomiting and diarrhea. Negative for blood in stool and melena.  Genitourinary: Negative for dysuria and urgency.  Musculoskeletal: Negative for neck pain.  Neurological: Negative for seizures, loss of consciousness and headaches.  Psychiatric/Behavioral: Positive for depression. Negative for substance abuse.   Blood pressure 126/89, pulse 67, temperature 98 F (36.7 C), temperature source Oral, resp. rate 17, height _0  (1.702 m), weight 96.4 kg (212 lb 8.4 oz), SpO2 100 %. Physical Exam  Vitals reviewed. Constitutional: He is oriented to person, place, and time. He appears  well-developed and well-nourished. No distress.  HENT:  Head: Normocephalic and atraumatic.  Right Ear: External ear normal.  Left Ear: External ear normal.  Eyes: Conjunctivae are normal. No scleral icterus.  Neck: Normal range of motion. Neck supple. No tracheal deviation present. No thyromegaly present.  Cardiovascular: Normal rate and normal heart sounds.   Respiratory: Effort normal and breath sounds normal. No stridor. No respiratory distress. He has no wheezes.  GI: Soft. He exhibits no distension. There is no rebound and no guarding.    Not really TTP. No rt/guarding/peritonitis  Musculoskeletal: He exhibits no edema or tenderness.  Lymphadenopathy:    He has no cervical adenopathy.  Neurological: He is alert and oriented to person, place, and time. He exhibits normal muscle tone.  Skin: Skin is warm and dry. No rash noted. He is not diaphoretic. No erythema. No pallor.  Psychiatric: His behavior is normal. Judgment and thought content normal. He exhibits a depressed mood.  Tearful at times    Assessment/Plan: Metastatic colon cancer Depression Nausea/vomiting Worsening peritoneal metastatic disease with tumor necrosis vs Small bowel fistulization  He has ongoing progression of his intra-abdominal disease burden. There is no free air. No evidence of bowel perforation. He is nontoxic, min to no abd pain on exam.   Spent 20 minutes with pt counseling pt.  rec repeat CT to see if contrast progresses past area to r/o SB fistulization  Essentially same rec as Dr Marin Olp gave pt earlier this week.   i have recommended to him that he be tx to [facility] to be evaluated and managed there. Surgically we really have no options for him here. He simply can't make a decision about surgery. Explained to him that he has essentially reached a point as to what he wants done. If he delays surgery in my opinion it will be higher risk, less successful.   Not sure what scares him about  surgery; clearly it is high risk. At what point he asked can you simply not remove the tumor mass which makes me wonder if it is the intraoperative chemotherapy that concerns him.   There is no need for urgent intervention here.   As of now he is declining tx to [facility]  We will f/u on CT and go from there.  Spoke with Dr Burr Medico who will see tomorrow.   We have nothing really surgical to offer him here. If CT negative for SB connection, can hopefully start clears Sunday.  If has ongoing vomiting, may need NG  Leighton Ruff. Redmond Pulling, MD, FACS General, Bariatric, & Minimally Invasive Surgery [facility], [place]   [facility] M 12/12/2014, 8:15 PM

## 2014-12-12 NOTE — H&P (Addendum)
[facility] History and Physical  DARRYN KYDD OEV:[id number] DOB: Oct 09, 1970 DOA: 12/12/2014   PCP: Does not have a primary care physician. Specialists: Dr. Marin Olp is his oncologist  Chief Complaint: Nausea, vomiting and abdominal pain since Thursday  HPI: David Conrad is a 44 y.o. 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 He last received his chemotherapy on April 19. His symptoms started on Thursday, about 2 days afterwards. He mentions nausea and vomiting along with abdominal pain and diarrhea. He's had the numerous episodes of emesis with yellowish and greenish fluid. Denies any blood in the emesis. He had a few episodes of loose stool this morning, some of which had small amount of blood. Abdominal pain is mostly in the lower abdomen which is mainly due to his cancer. The pain got worse on Thursday once he started vomiting. The pain is a sharp pain 6 out of 10 in intensity without radiation. He denies any fever. He has had chills. He lost about 14 pounds in the last 2 weeks. Has had decreased activity level. No syncopal episodes. Hasn't had any vomiting since he has been in the emergency department. Abdominal x-ray raised the possibility of a small bowel obstruction.  Home Medications: Prior to Admission medications   Medication Sig Start Date End Date Taking? Authorizing Provider  ALPRAZolam (XANAX) 1 MG tablet Take 1 tablet every 8 hour, IF NEEDED, for anxiety. Patient taking differently: Take 1 mg by mouth every 8 (eight) hours as needed for anxiety.  10/26/14  Yes Volanda Napoleon, MD  dexamethasone (DECADRON) 4 MG tablet Take 4 mg by mouth 2 (two) times daily with a meal. Start on the day after chemo for 3 days   Yes Historical Provider, MD  escitalopram (LEXAPRO) 20 MG tablet Take 1 tablet (20 mg total) by mouth daily. 12/09/14  Yes Volanda Napoleon, MD  lactulose (CHRONULAC) 10 GM/15ML solution Take 30 mLs (20 g total) by mouth daily as  needed for mild constipation. 10/13/14  Yes Charlynne Cousins, MD  lidocaine-prilocaine (EMLA) cream Apply topically as needed. Patient taking differently: Apply 1 application topically as needed.  01/21/14  Yes Volanda Napoleon, MD  loperamide (IMODIUM A-D) 2 MG tablet Take 2 at onset of diarrhea, then 1 every 2hrs until 12hr without a BM. May take 2 tab every 4hrs at bedtime. If diarrhea recurs repeat. 11/02/14  Yes Volanda Napoleon, MD  LORazepam (ATIVAN) 0.5 MG tablet Take 0.5 mg by mouth at bedtime.   Yes Historical Provider, MD  morphine (MS CONTIN) 30 MG 12 hr tablet Take 30 mg by mouth 2 (two) times daily. 11/20/14  Yes Historical Provider, MD  ondansetron (ZOFRAN) 8 MG tablet Take by mouth every 8 (eight) hours as needed for nausea or vomiting.   Yes Historical Provider, MD  oxyCODONE (OXY IR/ROXICODONE) 5 MG immediate release tablet Take 5 mg by mouth every 4 (four) hours as needed. for pain 11/20/14  Yes Historical Provider, MD  polyethylene glycol (MIRALAX / GLYCOLAX) packet Take 17 g by mouth daily. 10/13/14  Yes Charlynne Cousins, MD  Lydia   Yes Historical Provider, MD  prochlorperazine (COMPAZINE) 10 MG tablet Take 10 mg by mouth every 6 (six) hours as needed for nausea or vomiting.   Yes Historical Provider, MD  Pyridoxine HCl (VITAMIN B-6) 250 MG tablet Take 1 tablet (250 mg total) by mouth daily. 09/23/14  Yes Volanda Napoleon, MD  Allergies:  Allergies  Allergen Reactions  . Morphine Other (See Comments)    Other reaction(s): Dizziness (intolerance)    Past Medical History: Past Medical History  Diagnosis Date  . Anemia   . Cancer 10/13 and 2015    colon ca  . History of colon resection     Past Surgical History  Procedure Laterality Date  . Colonoscopy  06/17/2012    Procedure: COLONOSCOPY;  Surgeon: Juanita Craver, MD;  Location: [facility] ENDOSCOPY;  Service: Endoscopy;  Laterality: N/A;  . Colon resection  06/19/2012    Procedure: COLON  RESECTION LAPAROSCOPIC;  Surgeon: Ralene Ok, MD;  Location: [place];  Service: General;  Laterality: Right;  . Colostomy revision  06/19/2012    Procedure: COLON RESECTION RIGHT;  Surgeon: Ralene Ok, MD;  Location: [place];  Service: General;  Laterality: Right;    Social History: He lives in [place] with his wife and 3 children. No smoking or alcohol use. No illicit drug use. Activity level has been less than normal. Due to his acute illness.  Family History:  Family History  Problem Relation Age of Onset  . Alcohol abuse Father   . Colon cancer Maternal Grandfather      Review of Systems - History obtained from the patient General ROS: positive for  - fatigue Psychological ROS: positive for - anxiety Ophthalmic ROS: negative ENT ROS: negative Allergy and Immunology ROS: negative Hematological and Lymphatic ROS: negative Endocrine ROS: negative Respiratory ROS: no cough, shortness of breath, or wheezing Cardiovascular ROS: no chest pain or dyspnea on exertion Gastrointestinal ROS: as in hpi Genito-Urinary ROS: no dysuria, trouble voiding, or hematuria Musculoskeletal ROS: negative Neurological ROS: no TIA or stroke symptoms Dermatological ROS: negative  Physical Examination  Filed Vitals:   12/12/14 0858 12/12/14 1113  BP:  115/64  Pulse: 70 72  Temp: 97.6 F (36.4 C) 97.9 F (36.6 C)  TempSrc: Oral Oral  Resp: 18 21  SpO2: 97% 99%    BP 115/64 mmHg  Pulse 72  Temp(Src) 97.9 F (36.6 C) (Oral)  Resp 21  SpO2 99%  General appearance: alert, cooperative, appears stated age and moderate discomfort Head: Normocephalic, without obvious abnormality, atraumatic Eyes: conjunctivae/corneas clear. PERRL, EOM's intact.  Throat: lips, mucosa, and tongue normal; teeth and gums normal Neck: no adenopathy, no carotid bruit, no JVD, supple, symmetrical, trachea midline and thyroid not enlarged, symmetric, no tenderness/mass/nodules Resp: clear to auscultation  bilaterally Cardio: regular rate and rhythm, S1, S2 normal, no murmur, click, rub or gallop GI: Soft. Tender diffusely without any rebound, rigidity or guarding. No masses or organomegaly. Fullness is appreciated in the lower abdomen. Extremities: extremities normal, atraumatic, no cyanosis or edema Pulses: 2+ and symmetric Skin: Skin color, texture, turgor normal. No rashes or lesions Lymph nodes: Cervical, supraclavicular, and axillary nodes normal. Neurologic: Awake and alert. Oriented 3. No focal neurological deficits.  Laboratory Data: Results for orders placed or performed during the hospital encounter of 12/12/14 (from the past 48 hour(s))  CBC with Differential     Status: Abnormal   Collection Time: 12/12/14  9:11 AM  Result Value Ref Range   WBC 5.8 4.0 - 10.5 K/uL   RBC 4.89 4.22 - 5.81 MIL/uL   Hemoglobin 11.9 (L) 13.0 - 17.0 g/dL   HCT 37.9 (L) 39.0 - 52.0 %   MCV 77.5 (L) 78.0 - 100.0 fL   MCH 24.3 (L) 26.0 - 34.0 pg   MCHC 31.4 30.0 - 36.0 g/dL   RDW 17.0 (  H) 11.5 - 15.5 %   Platelets 366 150 - 400 K/uL   Neutrophils Relative % 77 43 - 77 %   Neutro Abs 4.4 1.7 - 7.7 K/uL   Lymphocytes Relative 22 12 - 46 %   Lymphs Abs 1.3 0.7 - 4.0 K/uL   Monocytes Relative 1 (L) 3 - 12 %   Monocytes Absolute 0.1 0.1 - 1.0 K/uL   Eosinophils Relative 0 0 - 5 %   Eosinophils Absolute 0.0 0.0 - 0.7 K/uL   Basophils Relative 0 0 - 1 %   Basophils Absolute 0.0 0.0 - 0.1 K/uL  Comprehensive metabolic panel     Status: Abnormal   Collection Time: 12/12/14  9:11 AM  Result Value Ref Range   Sodium 137 135 - 145 mmol/L   Potassium 3.4 (L) 3.5 - 5.1 mmol/L   Chloride 104 96 - 112 mmol/L   CO2 23 19 - 32 mmol/L   Glucose, Bld 136 (H) 70 - 99 mg/dL   BUN 19 6 - 23 mg/dL   Creatinine, Ser 0.87 0.50 - 1.35 mg/dL   Calcium 9.3 8.4 - 10.5 mg/dL   Total Protein 7.7 6.0 - 8.3 g/dL   Albumin 3.6 3.5 - 5.2 g/dL   AST 56 (H) 0 - 37 U/L   ALT 92 (H) 0 - 53 U/L   Alkaline Phosphatase 93 39  - 117 U/L   Total Bilirubin 0.7 0.3 - 1.2 mg/dL   GFR calc non Af Amer >90 >90 mL/min   GFR calc Af Amer >90 >90 mL/min    Comment: (NOTE) The eGFR has been calculated using the CKD EPI equation. This calculation has not been validated in all clinical situations. eGFR's persistently <90 mL/min signify possible Chronic Kidney Disease.    Anion gap 10 5 - 15  Lipase, blood     Status: None   Collection Time: 12/12/14  9:11 AM  Result Value Ref Range   Lipase 22 11 - 59 U/L    Radiology Reports: Dg Abd Acute W/chest  12/12/2014   CLINICAL DATA:  Abdominal pain, nausea, vomiting, fever for 3 days. Colon cancer.  EXAM: DG ABDOMEN ACUTE W/ 1V CHEST  COMPARISON:  11/02/2014  FINDINGS: Very low lung volumes with bibasilar atelectasis. Stable right jugular Port-A-Cath. Prominent cardiac silhouette likely due to low volumes. No pneumothorax.  No free intraperitoneal gas. Nonspecific right upper quadrant air-fluid levels. There is 1 distended small bowel loop in the left upper quadrant. Postoperative changes with bowel staples are noted  IMPRESSION: There is 1 dilated small bowel loop associated with air-fluid levels. Developing partial small bowel obstruction is not excluded.  No free intraperitoneal gas  Bibasilar atelectasis.   Electronically Signed   By: Marybelle Killings M.D.   On: 12/12/2014 10:15     Problem List  Principal Problem:   Nausea vomiting and diarrhea Active Problems:   Colon cancer, ascending, pT3, pN2a (6/18 LN) , pMX    Hypokalemia   Abdominal pain   Bowel obstruction   Metastasis from colon cancer   Assessment: This is a 44 year old African-American male who has metastatic colon cancer on chemotherapy who presents with nausea, vomiting and abdominal pain with a few loose stools. Abdominal x-ray raised the possibility of a small bowel obstruction. This could all be secondary to chemotherapy. C. difficile is a possibility.  Plan: #1 Nausea, vomiting and abdominal  pain/possible SBO: We will get a CT scan of his abdomen and pelvis. We'll keep him nothing  by mouth for now. Pain control and antiemetics as needed. Hold off on NG tube unless he starts vomiting again. All of this could be secondary to small bowel obstruction or could be due to chemotherapy.  #2 acute diarrhea with blood in stool: Check stool for C. difficile. Keep him hydrated. Monitor for hematochezia. Check hemoglobins regularly.  #3 Metastatic colon cancer on chemotherapy: Last chemotherapy was on April 19. Apparently, patient has significant tumor burden in his abdomen. Plan was for the patient to go to [facility] to consider surgery and intraperitoneal chemotherapy. Apparently surgery was scheduled, but the patient canceled it as he was not certain. I will let his oncologist discuss this issue further with the patient.  #4 Hypokalemia: Will be repleted intravenously  #5 Microcytic anemia: Hemoglobin close to baseline, although he could be hemoconcentrated due to dehydration. Continue to monitor.  ADDENDUM CT abdomen report reviewed. No SBO but possible tumor necrosis. Abnormality discussed with Dr. Redmond Pulling with general surgery. He will see patient.  DVT Prophylaxis: SCDs Code Status: Full code Family Communication: Discussed with the patient and his mother  Disposition Plan: Admit to MedSurg   Further management decisions will depend on results of further testing and patient's response to treatment.   [facility]  [facility] Pager [phone]  If 7PM-7AM, please contact night-coverage [email or web address] Password [facility]  12/12/2014, 11:25 AM

## 2014-12-12 NOTE — ED Provider Notes (Signed)
CSN: [id number]     Arrival date & time 12/12/14  9030 History   First MD Initiated Contact with Patient 12/12/14 0920     Chief Complaint  Patient presents with  . Nausea  . Emesis     (Consider location/radiation/quality/duration/timing/severity/associated sxs/prior Treatment) Patient is a 44 y.o. male presenting with vomiting. The history is provided by the patient.  Emesis Associated symptoms: abdominal pain   Associated symptoms: no chills, no headaches and no sore throat   Patient w hx metastatic colon ca, s/p prior hemicolectomy for same as couple yrs prior, currently on chemotherapy including most recently 2 days ago, c/o nv for the past 2 days. Several episodes. Emesis clear to color recently ingested fluids. Not bloody or bilious. Denies abd distension. Diffuse abdominal cramping/pain, mod-sev, non radiating. No specific exacerbating or alleviating factor. Did have loose-watery bm this morning. Pt notes prior admission for sbo, ?similar. Denies fever or chills. No dysuria. No headaches. No cp or sob. No cough or uri c/o.      Past Medical History  Diagnosis Date  . Anemia   . Cancer 10/13 and 2015    colon ca  . History of colon resection    Past Surgical History  Procedure Laterality Date  . Colonoscopy  06/17/2012    Procedure: COLONOSCOPY;  Surgeon: Juanita Craver, MD;  Location: [facility] ENDOSCOPY;  Service: Endoscopy;  Laterality: N/A;  . Colon resection  06/19/2012    Procedure: COLON RESECTION LAPAROSCOPIC;  Surgeon: Ralene Ok, MD;  Location: [place];  Service: General;  Laterality: Right;  . Colostomy revision  06/19/2012    Procedure: COLON RESECTION RIGHT;  Surgeon: Ralene Ok, MD;  Location: [place];  Service: General;  Laterality: Right;   Family History  Problem Relation Age of Onset  . Alcohol abuse Father    History  Substance Use Topics  . Smoking status: Former Smoker -- 0.50 packs/day for 15 years    Types: Cigarettes    Start date: 10/01/1997    Quit date: 07/31/2013  . Smokeless tobacco: Never Used     Comment: quit 2014   . Alcohol Use: No    Review of Systems  Constitutional: Negative for fever and chills.  HENT: Negative for sore throat.   Eyes: Negative for redness.  Respiratory: Negative for cough and shortness of breath.   Cardiovascular: Negative for chest pain.  Gastrointestinal: Positive for nausea, vomiting and abdominal pain.  Endocrine: Negative for polyuria.  Genitourinary: Negative for flank pain.  Musculoskeletal: Negative for back pain and neck pain.  Skin: Negative for rash.  Neurological: Negative for headaches.  Hematological: Does not bruise/bleed easily.  Psychiatric/Behavioral: Negative for confusion.      Allergies  Morphine  Home Medications   Prior to Admission medications   Medication Sig Start Date End Date Taking? Authorizing Provider  ALPRAZolam (XANAX) 1 MG tablet Take 1 tablet every 8 hour, IF NEEDED, for anxiety. 10/26/14   Volanda Napoleon, MD  DM-Doxylamine-Acetaminophen (NYQUIL COLD & FLU PO) Take 1 tablet by mouth 2 (two) times daily as needed (cold symptoms).     Historical Provider, MD  escitalopram (LEXAPRO) 20 MG tablet Take 1 tablet (20 mg total) by mouth daily. 12/09/14   Volanda Napoleon, MD  ibuprofen (ADVIL,MOTRIN) 200 MG tablet Take 200 mg by mouth every 6 (six) hours as needed for moderate pain (pain).     Historical Provider, MD  lactulose (CHRONULAC) 10 GM/15ML solution Take 30 mLs (20 g total) by mouth  daily as needed for mild constipation. 10/13/14   Charlynne Cousins, MD  lidocaine-prilocaine (EMLA) cream Apply topically as needed. 01/21/14   Volanda Napoleon, MD  loperamide (IMODIUM A-D) 2 MG tablet Take 2 at onset of diarrhea, then 1 every 2hrs until 12hr without a BM. May take 2 tab every 4hrs at bedtime. If diarrhea recurs repeat. 11/02/14   Volanda Napoleon, MD  LORazepam (ATIVAN) 0.5 MG tablet Take 0.5 mg by mouth at bedtime.    Historical Provider, MD  morphine (MSIR)  15 MG tablet Take 15 mg by mouth every 6 (six) hours as needed for severe pain.    Historical Provider, MD  ondansetron (ZOFRAN) 8 MG tablet Take by mouth every 8 (eight) hours as needed for nausea or vomiting.    Historical Provider, MD  polyethylene glycol (MIRALAX / GLYCOLAX) packet Take 17 g by mouth daily. 10/13/14   Charlynne Cousins, MD  Pyridoxine HCl (VITAMIN B-6) 250 MG tablet Take 1 tablet (250 mg total) by mouth daily. Patient not taking: Reported on 12/08/2014 09/23/14   Volanda Napoleon, MD   Pulse 70  Temp(Src) 97.6 F (36.4 C) (Oral)  Resp 18  SpO2 97% Physical Exam  Constitutional: He is oriented to person, place, and time. He appears well-developed and well-nourished. No distress.  HENT:  Mouth/Throat: Oropharynx is clear and moist.  Eyes: Conjunctivae are normal. No scleral icterus.  Neck: Neck supple. No tracheal deviation present.  Cardiovascular: Normal rate, regular rhythm, normal heart sounds and intact distal pulses.   Pulmonary/Chest: Effort normal and breath sounds normal. No accessory muscle usage. No respiratory distress.  Port right chest without sign of infection.   Abdominal: Soft. He exhibits distension. He exhibits no mass. There is tenderness. There is no rebound and no guarding.  Mildly distended, mild diffuse tenderness. No incarcerated hernia.   Genitourinary:  No cva tenderness.   Musculoskeletal: Normal range of motion. He exhibits no edema.  Neurological: He is alert and oriented to person, place, and time.  Skin: Skin is warm and dry. He is not diaphoretic.  Psychiatric: He has a normal mood and affect.  Nursing note and vitals reviewed.   ED Course  Procedures (including critical care time) Labs Review  Results for orders placed or performed during the hospital encounter of 12/12/14  CBC with Differential  Result Value Ref Range   WBC 5.8 4.0 - 10.5 K/uL   RBC 4.89 4.22 - 5.81 MIL/uL   Hemoglobin 11.9 (L) 13.0 - 17.0 g/dL   HCT 37.9 (L)  39.0 - 52.0 %   MCV 77.5 (L) 78.0 - 100.0 fL   MCH 24.3 (L) 26.0 - 34.0 pg   MCHC 31.4 30.0 - 36.0 g/dL   RDW 17.0 (H) 11.5 - 15.5 %   Platelets 366 150 - 400 K/uL   Neutrophils Relative % 77 43 - 77 %   Neutro Abs 4.4 1.7 - 7.7 K/uL   Lymphocytes Relative 22 12 - 46 %   Lymphs Abs 1.3 0.7 - 4.0 K/uL   Monocytes Relative 1 (L) 3 - 12 %   Monocytes Absolute 0.1 0.1 - 1.0 K/uL   Eosinophils Relative 0 0 - 5 %   Eosinophils Absolute 0.0 0.0 - 0.7 K/uL   Basophils Relative 0 0 - 1 %   Basophils Absolute 0.0 0.0 - 0.1 K/uL  Comprehensive metabolic panel  Result Value Ref Range   Sodium 137 135 - 145 mmol/L   Potassium 3.4 (L)  3.5 - 5.1 mmol/L   Chloride 104 96 - 112 mmol/L   CO2 23 19 - 32 mmol/L   Glucose, Bld 136 (H) 70 - 99 mg/dL   BUN 19 6 - 23 mg/dL   Creatinine, Ser 0.87 0.50 - 1.35 mg/dL   Calcium 9.3 8.4 - 10.5 mg/dL   Total Protein 7.7 6.0 - 8.3 g/dL   Albumin 3.6 3.5 - 5.2 g/dL   AST 56 (H) 0 - 37 U/L   ALT 92 (H) 0 - 53 U/L   Alkaline Phosphatase 93 39 - 117 U/L   Total Bilirubin 0.7 0.3 - 1.2 mg/dL   GFR calc non Af Amer >90 >90 mL/min   GFR calc Af Amer >90 >90 mL/min   Anion gap 10 5 - 15  Lipase, blood  Result Value Ref Range   Lipase 22 11 - 59 U/L   Dg Abd Acute W/chest  12/12/2014   CLINICAL DATA:  Abdominal pain, nausea, vomiting, fever for 3 days. Colon cancer.  EXAM: DG ABDOMEN ACUTE W/ 1V CHEST  COMPARISON:  11/02/2014  FINDINGS: Very low lung volumes with bibasilar atelectasis. Stable right jugular Port-A-Cath. Prominent cardiac silhouette likely due to low volumes. No pneumothorax.  No free intraperitoneal gas. Nonspecific right upper quadrant air-fluid levels. There is 1 distended small bowel loop in the left upper quadrant. Postoperative changes with bowel staples are noted  IMPRESSION: There is 1 dilated small bowel loop associated with air-fluid levels. Developing partial small bowel obstruction is not excluded.  No free intraperitoneal gas  Bibasilar  atelectasis.   Electronically Signed   By: Marybelle Killings M.D.   On: 12/12/2014 10:15      MDM   Iv ns bolus. Labs. Dilaudid 1 mg iv. zofran iv.  Reviewed nursing notes and prior charts for additional history.   Xrays.  Additional iv.   Pt w persistent pain and nausea.  Recheck abd soft. Mid to right tenderness, no rebound or guarding.  ?early/developing sbo on plain films.  Note made on recent notes from Dr Marin Olp, and recent admit/surgery consult re pt declining further surgery, including debulking and/or diverting procedure.   Will admit to med service for ivf, pain and nausea control.    Lajean Saver, MD 12/12/14 1041

## 2014-12-12 NOTE — ED Notes (Signed)
Pt is aware we need a urine sample. Pt was provided urinal and asked to notify when he was able to void.

## 2014-12-12 NOTE — ED Notes (Signed)
Bed: WA09 Expected date: 12/12/14 Expected time: 8:51 AM Means of arrival: Ambulance Comments: Cancer Pt N/V

## 2014-12-12 NOTE — ED Notes (Signed)
Nurse in patient care will call back for report.

## 2014-12-12 NOTE — ED Notes (Signed)
Family at bedside. 

## 2014-12-12 NOTE — ED Notes (Signed)
MD at bedside. 

## 2014-12-12 NOTE — ED Notes (Signed)
Patient transported to X-ray 

## 2014-12-12 NOTE — ED Notes (Signed)
Patient is a cancer patient receiving chemotherapy. He has had nausea, vomiting and diarrhea since having chemotherapy on Thursday. Patient is prescribed zofran and compazine but has not been able to keep it down to be effective. He also complains of abdominal pain associated with the constant vomiting. Patient has colon cancer. He has not been able to eat or drink since early Thursday.

## 2014-12-13 ENCOUNTER — Other Ambulatory Visit: Payer: Self-pay | Admitting: Hematology

## 2014-12-13 DIAGNOSIS — R109 Unspecified abdominal pain: Secondary | ICD-10-CM

## 2014-12-13 DIAGNOSIS — D509 Iron deficiency anemia, unspecified: Secondary | ICD-10-CM

## 2014-12-13 DIAGNOSIS — R112 Nausea with vomiting, unspecified: Secondary | ICD-10-CM

## 2014-12-13 DIAGNOSIS — C182 Malignant neoplasm of ascending colon: Principal | ICD-10-CM

## 2014-12-13 LAB — COMPREHENSIVE METABOLIC PANEL
ALBUMIN: 2.9 g/dL — AB (ref 3.5–5.2)
ALK PHOS: 76 U/L (ref 39–117)
ALT: 71 U/L — AB (ref 0–53)
AST: 30 U/L (ref 0–37)
Anion gap: 6 (ref 5–15)
BILIRUBIN TOTAL: 0.6 mg/dL (ref 0.3–1.2)
BUN: 13 mg/dL (ref 6–23)
CALCIUM: 8.6 mg/dL (ref 8.4–10.5)
CO2: 27 mmol/L (ref 19–32)
CREATININE: 0.86 mg/dL (ref 0.50–1.35)
Chloride: 104 mmol/L (ref 96–112)
GFR calc Af Amer: >90 mL/min (ref >90)
GLUCOSE: 101 mg/dL — AB (ref 70–99)
POTASSIUM: 3.7 mmol/L (ref 3.5–5.1)
SODIUM: 137 mmol/L (ref 135–145)
Total Protein: 6.4 g/dL (ref 6.0–8.3)

## 2014-12-13 LAB — CBC
HEMATOCRIT: 34.9 % — AB (ref 39.0–52.0)
HEMOGLOBIN: 10.6 g/dL — AB (ref 13.0–17.0)
MCH: 23.9 pg — ABNORMAL LOW (ref 26.0–34.0)
MCHC: 30.4 g/dL (ref 30.0–36.0)
MCV: 78.8 fL (ref 78.0–100.0)
PLATELETS: 297 10*3/uL (ref 150–400)
RBC: 4.43 MIL/uL (ref 4.22–5.81)
RDW: 17.1 % — ABNORMAL HIGH (ref 11.5–15.5)
WBC: 5.2 10*3/uL (ref 4.0–10.5)

## 2014-12-13 NOTE — Progress Notes (Addendum)
David Conrad   DOB:09/21/1970   XB#:[id number]   CBU#:[id number]  Subjective: David Conrad is my partner Dr. Antonieta Pert pt and I'm covering for him. Patient was admitted for worsening abdominal pain and nausea. His last chemotherapy was on 4/19, and he developed significant nausea and vomiting after chemotherapy, and decided to come in.   Objective:  Filed Vitals:   12/13/14 1422  BP: 118/74  Pulse: 63  Temp: 98.6 F (37 C)  Resp: 16    Body mass index is 33.28 kg/(m^2).  Intake/Output Summary (Last 24 hours) at 12/13/14 1724 Last data filed at 12/13/14 1400  Gross per 24 hour  Intake 3050.83 ml  Output      0 ml  Net 3050.83 ml     Sclerae unicteric  Oropharynx clear  No peripheral adenopathy  Lungs clear -- no rales or rhonchi  Heart regular rate and rhythm  Abdomen soft, mild diffuse tenderness.   MSK no focal spinal tenderness, no peripheral edema   CBG (last 3)  No results for input(s): GLUCAP in the last 72 hours.   Labs:  Lab Results  Component Value Date   WBC 5.2 12/13/2014   HGB 10.6* 12/13/2014   HCT 34.9* 12/13/2014   MCV 78.8 12/13/2014   PLT 297 12/13/2014   NEUTROABS 4.4 12/12/2014    @LASTCHEMISTRY @  Urine Studies No results for input(s): UHGB, CRYS in the last 72 hours.  Invalid input(s): UACOL, UAPR, USPG, UPH, UTP, UGL, UKET, UBIL, UNIT, UROB, ULEU, UEPI, UWBC, URBC, UBAC, CAST, [place], [place]  Basic Metabolic Panel:  Recent Labs Lab 12/08/14 0912 12/12/14 0911 12/13/14 0540  NA 141 137 137  K 3.4 3.4* 3.7  CL 100 104 104  CO2 30 23 27   GLUCOSE 108 136* 101*  BUN 10 19 13   CREATININE 1.0 0.87 0.86  CALCIUM 9.9 9.3 8.6   GFR Estimated Creatinine Clearance: 122.5 mL/min (by C-G formula based on Cr of 0.86). Liver Function Tests:  Recent Labs Lab 12/08/14 0912 12/12/14 0911 12/13/14 0540  AST 18 56* 30  ALT 15 92* 71*  ALKPHOS 85* 93 76  BILITOT 0.90 0.7 0.6  PROT 8.5* 7.7 6.4  ALBUMIN  --  3.6 2.9*    Recent  Labs Lab 12/12/14 0911  LIPASE 22   No results for input(s): AMMONIA in the last 168 hours. Coagulation profile No results for input(s): INR, PROTIME in the last 168 hours.  CBC:  Recent Labs Lab 12/08/14 0912 12/12/14 0911 12/13/14 0540  WBC 6.1 5.8 5.2  NEUTROABS 2.9 4.4  --   HGB 11.9* 11.9* 10.6*  HCT 38.0* 37.9* 34.9*  MCV 79* 77.5* 78.8  PLT 371 366 297   Cardiac Enzymes: No results for input(s): CKTOTAL, CKMB, CKMBINDEX, TROPONINI in the last 168 hours. BNP: Invalid input(s): POCBNP CBG: No results for input(s): GLUCAP in the last 168 hours. D-Dimer No results for input(s): DDIMER in the last 72 hours. Hgb A1c No results for input(s): HGBA1C in the last 72 hours. Lipid Profile No results for input(s): CHOL, HDL, LDLCALC, TRIG, CHOLHDL, LDLDIRECT in the last 72 hours. Thyroid function studies No results for input(s): TSH, T4TOTAL, T3FREE, THYROIDAB in the last 72 hours.  Invalid input(s): FREET3 Anemia work up No results for input(s): VITAMINB12, FOLATE, FERRITIN, TIBC, IRON, RETICCTPCT in the last 72 hours. Microbiology Recent Results (from the past 240 hour(s))  Clostridium Difficile by PCR     Status: None   Collection Time: 12/12/14 10:12 PM  Result Value  Ref Range Status   C difficile by pcr NEGATIVE NEGATIVE Final      Studies:  Ct Abdomen Pelvis Wo Contrast  12/13/2014   CLINICAL DATA:  Current history of metastatic colon cancer, admitted earlier same date with nausea, vomiting and diarrhea which began after chemotherapy 2 days ago. CT abdomen and pelvis earlier same date showed increase in metastatic burden. Repeat CT requested to evaluate progression of contrast through the GI tract to exclude obstruction.  EXAM: CT ABDOMEN AND PELVIS WITHOUT CONTRAST 2316 hr  TECHNIQUE: Multidetector CT imaging of the abdomen and pelvis was performed following the standard protocol without IV contrast.  COMPARISON:  Enhanced CT abdomen and pelvis performed earlier  same date 1252 hr, 10/07/2014 and earlier.  FINDINGS: The ingested oral contrast has progressed throughout the small bowel and is present throughout the colon from the ileocolic anastomosis to the rectum. There is no evidence of small bowel or colonic obstruction. Scattered diverticula are present involving the distal descending colon and the sigmoid colon without evidence of acute diverticulitis.  The contrast filled urinary bladder is unremarkable.  IMPRESSION: 1. No evidence of small bowel or colonic obstruction. The oral contrast admitted for the CT approximately 10-1/2 hr earlier has progressed throughout the small bowel and is present in the colon. 2. Please see the report of the enhanced CT abdomen and pelvis performed earlier same date for details of abdominal metastatic disease.   Electronically Signed   By: Evangeline Dakin M.D.   On: 12/13/2014 07:34   Ct Abdomen Pelvis W Contrast  12/12/2014   CLINICAL DATA:  Colon cancer. Nausea and vomiting. Vomiting and diarrhea with onset of symptoms on Thursday. Chemotherapy on Thursday. Intractable vomiting.  EXAM: CT ABDOMEN AND PELVIS WITH CONTRAST  TECHNIQUE: Multidetector CT imaging of the abdomen and pelvis was performed using the standard protocol following bolus administration of intravenous contrast.  CONTRAST:  61mL OMNIPAQUE IOHEXOL 300 MG/ML SOLN, 128mL OMNIPAQUE IOHEXOL 300 MG/ML SOLN  COMPARISON:  10/07/2014.  FINDINGS: Musculoskeletal: RIGHT sacroiliac joint ankylosis. No aggressive osseous lesion. Benign bone island in the RIGHT ischium.  Lung Bases: Mild dependent atelectasis and/or scarring. Little interval change. No airspace consolidation in this patient undergoing chemotherapy.  Liver: Subcapsular RIGHT hepatic lobe metastases with probable small mucinous metastasis in the lateral RIGHT hepatic lobe. Tiny low-density lesions in the RIGHT hepatic dome are nonspecific. Overall, the appearance of the liver is unchanged compared 04/09/2014. No  new metastatic disease.  Spleen:  Normal.  Gallbladder:  Normal.  Common bile duct:  Normal.  Pancreas:  Normal.  Adrenal glands:  Normal bilaterally.  Kidneys: Normal enhancement. Delayed excretion of contrast from the kidneys is normal. Both ureters are within normal limits.  Stomach: Proximal stomach is collapsed. No mural thickening or mural mass lesion.  Small bowel: Duodenum appears normal. Mild distension of small bowel at the duodenal jejunal junction without obstruction. Distal small bowel appears collapsed and there is no obstruction leading to the ileocolonic anastomosis.  Colon: RIGHT hemicolectomy. Anastomotic staple line is present in the central abdomen (image 37 series 2). The distal colon is mostly collapsed. No mass lesion.  Pelvic Genitourinary:  Urinary bladder appears normal.  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 Compared to the prior exam, these now demonstrates small locules of gas, which may represent either fistulization with small bowel or necrosis. Abscess is difficult to completely exclude based on the presence of gas which was not on the  prior CT 10/07/2014.  Unchanged rectovesical implant. Small implants in the LEFT pericolic gutter near the splenic flexure appears slightly larger than on the prior exam of 10/07/2014. In aggregate, these 2 nodules measures 17 mm x 12 mm.  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  Contrast is present in the small bowel but has not yet reached the residual colon. This makes assessment of fistula difficult. A reasonable option would be to return the patient for CT scanning in 12 hours to see if any oral contrast is present in the metastatic lesion that would indicate fistulization. Given the advanced disease, this decision should be made  based on whether it will affect clinical management.  Vasculature: Pulmonary vessels at the lung bases appear within normal limits. No acute vascular abnormality.  Body Wall: Metastatic implants are present along the surgical incision in the midline. The largest of these has increased from February 2016, measuring 20 mm x 35 mm (image 41 series 2). Unchanged fat containing RIGHT inguinal hernia.  IMPRESSION: 1. Negative for bowel obstruction. Minimal increase in mucinous colon cancer metastatic burden in the peritoneum, largest in the RIGHT hemicolectomy bed. 2. In the largest peritoneal lesion in the RIGHT abdomen, locules of gas have developed within the tumor. The differential considerations are fistulization with small bowel or tumor necrosis with pockets of gas. Although abscess is a possibility, the relative lack of change in the configuration compared to the prior exam from February makes an abscess less likely. 3. Stable liver metastases.   Electronically Signed   By: Dereck Ligas M.D.   On: 12/12/2014 13:45   Dg Abd Acute W/chest  12/12/2014   CLINICAL DATA:  Abdominal pain, nausea, vomiting, fever for 3 days. Colon cancer.  EXAM: DG ABDOMEN ACUTE W/ 1V CHEST  COMPARISON:  11/02/2014  FINDINGS: Very low lung volumes with bibasilar atelectasis. Stable right jugular Port-A-Cath. Prominent cardiac silhouette likely due to low volumes. No pneumothorax.  No free intraperitoneal gas. Nonspecific right upper quadrant air-fluid levels. There is 1 distended small bowel loop in the left upper quadrant. Postoperative changes with bowel staples are noted  IMPRESSION: There is 1 dilated small bowel loop associated with air-fluid levels. Developing partial small bowel obstruction is not excluded.  No free intraperitoneal gas  Bibasilar atelectasis.   Electronically Signed   By: Marybelle Killings M.D.   On: 12/12/2014 10:15    Assessment: 44 y.o. with metastatic colon cancer, on chemotherapy  Plan:  -Dr. Marin Olp  has recommended him to consider debulking and HIPEC at [facility], he was seen by Dr. Crisoforo Oxford, but patient has been refusing the surgery, or basically mentally not ready for surgery.  -He was seen by general surgeon Dr. Redmond Pulling yesterday, who recommended him to be transferred to [facility] for consider surgery -I also encouraged him to consider surgery, he is thinking about it. -CT with oral cont did not reveal significant leakage of contrast -continue supportive care and symptom management -Dr. Marin Olp will return tomorrow and discuss with him further.   Truitt Merle, MD 12/13/2014  5:24 PM

## 2014-12-13 NOTE — Progress Notes (Signed)
Utilization review completed.  

## 2014-12-13 NOTE — Progress Notes (Signed)
Patient and family have requested that on-call MD for [facility] be notified that patient has concerns with Dr. Redmond Pulling and his care.  Dr. Lucia Gaskins on call and paged.  Dr. Lucia Gaskins arrived to unit and notified of patient's concern.  Christen Bame RN

## 2014-12-13 NOTE — Progress Notes (Signed)
[facility] PROGRESS NOTE  David Conrad ZSW:[id number] DOB: 05-25-1971 DOA: 12/12/2014  PCP: No PCP Per Patient  Brief HPI: 44 year old male with a history of metastatic colon cancer on chemotherapy, presented with nausea, vomiting, abdominal pain. Last chemotherapy was on April 19. CT scan showed possible tumor necrosis. He was admitted for further management  Past medical history:  Past Medical History  Diagnosis Date  . Anemia   . Cancer 10/13 and 2015    colon ca  . History of colon resection     Consultants: Gen. surgery, oncology  Procedures: None  Antibiotics: None  Subjective: Patient feels a little better this morning. Continues to have nausea but no vomiting. No diarrhea. Abdominal pain is about the same. Very upset that everybody is talking to him about going to [facility] for surgery.  Objective: Vital Signs  Filed Vitals:   12/12/14 1451 12/12/14 1759 12/12/14 2120 12/13/14 0615  BP: 126/89  118/72 112/78  Pulse: 67  67 59  Temp: 98 F (36.7 C)  97.7 F (36.5 C) 98.1 F (36.7 C)  TempSrc: Oral  Oral Oral  Resp: 17  17 16   Height:  5\' 7"  (1.702 m)    Weight:  96.4 kg (212 lb 8.4 oz)    SpO2: 100%  97% 100%    Intake/Output Summary (Last 24 hours) at 12/13/14 0912 Last data filed at 12/13/14 0600  Gross per 24 hour  Intake 4250.83 ml  Output      0 ml  Net 4250.83 ml   Filed Weights   12/12/14 1759  Weight: 96.4 kg (212 lb 8.4 oz)    General appearance: alert, cooperative, appears stated age, no distress and Anxious Head: Normocephalic, without obvious abnormality, atraumatic Resp: clear to auscultation bilaterally Cardio: regular rate and rhythm, S1, S2 normal, no murmur, click, rub or gallop GI: Soft. Tender diffusely without any rebound, rigidity or guarding. No masses or organomegaly. Bowel sounds present. Extremities: extremities normal, atraumatic, no cyanosis or edema Neurologic: Alert and oriented 3. No focal neurological  deficits.  Lab Results:  Basic Metabolic Panel:  Recent Labs Lab 12/08/14 0912 12/12/14 0911 12/13/14 0540  NA 141 137 137  K 3.4 3.4* 3.7  CL 100 104 104  CO2 30 23 27   GLUCOSE 108 136* 101*  BUN 10 19 13   CREATININE 1.0 0.87 0.86  CALCIUM 9.9 9.3 8.6   Liver Function Tests:  Recent Labs Lab 12/08/14 0912 12/12/14 0911 12/13/14 0540  AST 18 56* 30  ALT 15 92* 71*  ALKPHOS 85* 93 76  BILITOT 0.90 0.7 0.6  PROT 8.5* 7.7 6.4  ALBUMIN  --  3.6 2.9*    Recent Labs Lab 12/12/14 0911  LIPASE 22   CBC:  Recent Labs Lab 12/08/14 0912 12/12/14 0911 12/13/14 0540  WBC 6.1 5.8 5.2  NEUTROABS 2.9 4.4  --   HGB 11.9* 11.9* 10.6*  HCT 38.0* 37.9* 34.9*  MCV 79* 77.5* 78.8  PLT 371 366 297    Recent Results (from the past 240 hour(s))  Clostridium Difficile by PCR     Status: None   Collection Time: 12/12/14 10:12 PM  Result Value Ref Range Status   C difficile by pcr NEGATIVE NEGATIVE Final      Studies/Results: Ct Abdomen Pelvis Wo Contrast  12/13/2014   CLINICAL DATA:  Current history of metastatic colon cancer, admitted earlier same date with nausea, vomiting and diarrhea which began after chemotherapy 2 days ago. CT abdomen and pelvis  earlier same date showed increase in metastatic burden. Repeat CT requested to evaluate progression of contrast through the GI tract to exclude obstruction.  EXAM: CT ABDOMEN AND PELVIS WITHOUT CONTRAST 2316 hr  TECHNIQUE: Multidetector CT imaging of the abdomen and pelvis was performed following the standard protocol without IV contrast.  COMPARISON:  Enhanced CT abdomen and pelvis performed earlier same date 1252 hr, 10/07/2014 and earlier.  FINDINGS: The ingested oral contrast has progressed throughout the small bowel and is present throughout the colon from the ileocolic anastomosis to the rectum. There is no evidence of small bowel or colonic obstruction. Scattered diverticula are present involving the distal descending colon  and the sigmoid colon without evidence of acute diverticulitis.  The contrast filled urinary bladder is unremarkable.  IMPRESSION: 1. No evidence of small bowel or colonic obstruction. The oral contrast admitted for the CT approximately 10-1/2 hr earlier has progressed throughout the small bowel and is present in the colon. 2. Please see the report of the enhanced CT abdomen and pelvis performed earlier same date for details of abdominal metastatic disease.   Electronically Signed   By: Evangeline Dakin M.D.   On: 12/13/2014 07:34   Ct Abdomen Pelvis W Contrast  12/12/2014   CLINICAL DATA:  Colon cancer. Nausea and vomiting. Vomiting and diarrhea with onset of symptoms on Thursday. Chemotherapy on Thursday. Intractable vomiting.  EXAM: CT ABDOMEN AND PELVIS WITH CONTRAST  TECHNIQUE: Multidetector CT imaging of the abdomen and pelvis was performed using the standard protocol following bolus administration of intravenous contrast.  CONTRAST:  65mL OMNIPAQUE IOHEXOL 300 MG/ML SOLN, 152mL OMNIPAQUE IOHEXOL 300 MG/ML SOLN  COMPARISON:  10/07/2014.  FINDINGS: Musculoskeletal: RIGHT sacroiliac joint ankylosis. No aggressive osseous lesion. Benign bone island in the RIGHT ischium.  Lung Bases: Mild dependent atelectasis and/or scarring. Little interval change. No airspace consolidation in this patient undergoing chemotherapy.  Liver: Subcapsular RIGHT hepatic lobe metastases with probable small mucinous metastasis in the lateral RIGHT hepatic lobe. Tiny low-density lesions in the RIGHT hepatic dome are nonspecific. Overall, the appearance of the liver is unchanged compared 04/09/2014. No new metastatic disease.  Spleen:  Normal.  Gallbladder:  Normal.  Common bile duct:  Normal.  Pancreas:  Normal.  Adrenal glands:  Normal bilaterally.  Kidneys: Normal enhancement. Delayed excretion of contrast from the kidneys is normal. Both ureters are within normal limits.  Stomach: Proximal stomach is collapsed. No mural thickening  or mural mass lesion.  Small bowel: Duodenum appears normal. Mild distension of small bowel at the duodenal jejunal junction without obstruction. Distal small bowel appears collapsed and there is no obstruction leading to the ileocolonic anastomosis.  Colon: RIGHT hemicolectomy. Anastomotic staple line is present in the central abdomen (image 37 series 2). The distal colon is mostly collapsed. No mass lesion.  Pelvic Genitourinary:  Urinary bladder appears normal.  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 Compared to the prior exam, these now demonstrates small locules of gas, which may represent either fistulization with small bowel or necrosis. Abscess is difficult to completely exclude based on the presence of gas which was not on the prior CT 10/07/2014.  Unchanged rectovesical implant. Small implants in the LEFT pericolic gutter near the splenic flexure appears slightly larger than on the prior exam of 10/07/2014. In aggregate, these 2 nodules measures 17 mm x 12 mm.  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  Contrast is present in the small bowel but has not yet reached the residual colon. This makes assessment of fistula difficult. A reasonable option would be to return the patient for CT scanning in 12 hours to see if any oral contrast is present in the metastatic lesion that would indicate fistulization. Given the advanced disease, this decision should be made based on whether it will affect clinical management.  Vasculature: Pulmonary vessels at the lung bases appear within normal limits. No acute vascular abnormality.  Body Wall: Metastatic implants are present along the surgical incision in the midline. The largest of these has increased from February 2016, measuring 20 mm x 35 mm (image 41  series 2). Unchanged fat containing RIGHT inguinal hernia.  IMPRESSION: 1. Negative for bowel obstruction. Minimal increase in mucinous colon cancer metastatic burden in the peritoneum, largest in the RIGHT hemicolectomy bed. 2. In the largest peritoneal lesion in the RIGHT abdomen, locules of gas have developed within the tumor. The differential considerations are fistulization with small bowel or tumor necrosis with pockets of gas. Although abscess is a possibility, the relative lack of change in the configuration compared to the prior exam from February makes an abscess less likely. 3. Stable liver metastases.   Electronically Signed   By: Dereck Ligas M.D.   On: 12/12/2014 13:45   Dg Abd Acute W/chest  12/12/2014   CLINICAL DATA:  Abdominal pain, nausea, vomiting, fever for 3 days. Colon cancer.  EXAM: DG ABDOMEN ACUTE W/ 1V CHEST  COMPARISON:  11/02/2014  FINDINGS: Very low lung volumes with bibasilar atelectasis. Stable right jugular Port-A-Cath. Prominent cardiac silhouette likely due to low volumes. No pneumothorax.  No free intraperitoneal gas. Nonspecific right upper quadrant air-fluid levels. There is 1 distended small bowel loop in the left upper quadrant. Postoperative changes with bowel staples are noted  IMPRESSION: There is 1 dilated small bowel loop associated with air-fluid levels. Developing partial small bowel obstruction is not excluded.  No free intraperitoneal gas  Bibasilar atelectasis.   Electronically Signed   By: Marybelle Killings M.D.   On: 12/12/2014 10:15    Medications:  Scheduled: . escitalopram  20 mg Oral Daily  . morphine  30 mg Oral BID  . vitamin B-6  250 mg Oral Daily   Continuous: . 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 No small bowel obstruction was noted but there was evidence of possible tumor necrosis. General surgery was consulted and their input is appreciated. No indication for urgent intervention. Patient previously has declined surgery at [facility]. His symptoms have improved. Clear liquids. CT scan was repeated to see if contrast went through and indeed no obstruction was noted. His symptoms could have been triggered by chemotherapy.  Acute diarrhea C. difficile negative. No further blood in the stool. Keep him hydrated. Monitor for hematochezia. Check hemoglobins regularly.  Metastatic colon cancer on chemotherapy Last chemotherapy was on April 19. Patient has significant tumor burden in his abdomen. Plan was for the  patient to go to [facility] to consider surgery and intraperitoneal chemotherapy. Apparently surgery was scheduled, but the patient canceled it as he was not certain. I will let his oncologist discuss this issue further with the patient. CT scan shows evidence for tumor necrosis.  Hypokalemia Repleted.  Microcytic anemia Hemoglobin close to baseline. Did drop some due to dilution. Continue to monitor. Transfuse as needed, though no indication currently.  DVT Prophylaxis: Only on SCDs due to mention of blood in stool.    Code Status: Full code  Family Communication: Discussed with the patient  Disposition Plan: Await oncology input. Await symptomatic improvement. Start mobilizing.    LOS: 1 day   [facility] Pager [phone] 12/13/2014, 9:12 AM  If 7PM-7AM, please contact night-coverage at [email or web address], password [facility]

## 2014-12-13 NOTE — Progress Notes (Signed)
PT Cancellation Note  Patient Details Name: David Conrad MRN: [id number] DOB: 12/25/1970   Cancelled Treatment:    Reason Eval/Treat Not Completed: PT screened, no needs identified, will sign off (patient reports  that he is up ad lib, no PT needs. RN confirms. )   Claretha Cooper 12/13/2014, 1:15 PM Tresa Endo PT [phone]

## 2014-12-13 NOTE — Progress Notes (Signed)
General Surgery Note  LOS: 1 day  POD -     Assessment/Plan: 1.  Advanced recurrent colon cancer  Extensive intra-abdominal mets, liver mets, no evidence of obstruction or extravasation - on CT scan 12/12/2014  Followed by Dr. Marin Olp and has been seen at [facility] by Dr. Marcha Solders for cytoreductive surgery.  So far he has been reluctant to pursue this course.  I agree with Dr. Dois Davenport assessment.  There is little that we can offer him surgically here.   He is actually feeling better now with nausea under better control.  2.  Laparoscopic right hemicolectomy - 06/19/2012 - A. Ramirez 3.  Depression 4.  DVT prophylaxis - he should probably be on chemopreventive tx with his cancer diagnosis   Principal Problem:   Nausea vomiting and diarrhea Active Problems:   Colon cancer, ascending, pT3, pN2a (6/18 LN) , pMX    Hypokalemia   Abdominal pain   Bowel obstruction   Metastasis from colon cancer  Subjective:  Alert.  No specific complaint.   In room by self. Objective:   Filed Vitals:   12/13/14 0615  BP: 112/78  Pulse: 59  Temp: 98.1 F (36.7 C)  Resp: 16     Intake/Output from previous day:  04/23 0701 - 04/24 0700 In: 4250.8 [P.O.:30; I.V.:3220.8; IV Piggyback:1000] Out: -   Intake/Output this shift:      Physical Exam:   General: WN AA M who is alert and oriented.    HEENT: Normal. Pupils equal. .   Lungs: Clear   Abdomen: Soft.  No localized tenderness.  Has BS.   Lab Results:    Recent Labs  12/12/14 0911 12/13/14 0540  WBC 5.8 5.2  HGB 11.9* 10.6*  HCT 37.9* 34.9*  PLT 366 297    BMET   Recent Labs  12/12/14 0911 12/13/14 0540  NA 137 137  K 3.4* 3.7  CL 104 104  CO2 23 27  GLUCOSE 136* 101*  BUN 19 13  CREATININE 0.87 0.86  CALCIUM 9.3 8.6    PT/INR  No results for input(s): LABPROT, INR in the last 72 hours.  ABG  No results for input(s): PHART, HCO3 in the last 72 hours.  Invalid input(s): PCO2, PO2   Studies/Results:  Ct Abdomen  Pelvis Wo Contrast  12/13/2014   CLINICAL DATA:  Current history of metastatic colon cancer, admitted earlier same date with nausea, vomiting and diarrhea which began after chemotherapy 2 days ago. CT abdomen and pelvis earlier same date showed increase in metastatic burden. Repeat CT requested to evaluate progression of contrast through the GI tract to exclude obstruction.  EXAM: CT ABDOMEN AND PELVIS WITHOUT CONTRAST 2316 hr  TECHNIQUE: Multidetector CT imaging of the abdomen and pelvis was performed following the standard protocol without IV contrast.  COMPARISON:  Enhanced CT abdomen and pelvis performed earlier same date 1252 hr, 10/07/2014 and earlier.  FINDINGS: The ingested oral contrast has progressed throughout the small bowel and is present throughout the colon from the ileocolic anastomosis to the rectum. There is no evidence of small bowel or colonic obstruction. Scattered diverticula are present involving the distal descending colon and the sigmoid colon without evidence of acute diverticulitis.  The contrast filled urinary bladder is unremarkable.  IMPRESSION: 1. No evidence of small bowel or colonic obstruction. The oral contrast admitted for the CT approximately 10-1/2 hr earlier has progressed throughout the small bowel and is present in the colon. 2. Please see the report of the enhanced  CT abdomen and pelvis performed earlier same date for details of abdominal metastatic disease.   Electronically Signed   By: Evangeline Dakin M.D.   On: 12/13/2014 07:34   Ct Abdomen Pelvis W Contrast  12/12/2014   CLINICAL DATA:  Colon cancer. Nausea and vomiting. Vomiting and diarrhea with onset of symptoms on Thursday. Chemotherapy on Thursday. Intractable vomiting.  EXAM: CT ABDOMEN AND PELVIS WITH CONTRAST  TECHNIQUE: Multidetector CT imaging of the abdomen and pelvis was performed using the standard protocol following bolus administration of intravenous contrast.  CONTRAST:  5mL OMNIPAQUE IOHEXOL 300  MG/ML SOLN, 1104mL OMNIPAQUE IOHEXOL 300 MG/ML SOLN  COMPARISON:  10/07/2014.  FINDINGS: Musculoskeletal: RIGHT sacroiliac joint ankylosis. No aggressive osseous lesion. Benign bone island in the RIGHT ischium.  Lung Bases: Mild dependent atelectasis and/or scarring. Little interval change. No airspace consolidation in this patient undergoing chemotherapy.  Liver: Subcapsular RIGHT hepatic lobe metastases with probable small mucinous metastasis in the lateral RIGHT hepatic lobe. Tiny low-density lesions in the RIGHT hepatic dome are nonspecific. Overall, the appearance of the liver is unchanged compared 04/09/2014. No new metastatic disease.  Spleen:  Normal.  Gallbladder:  Normal.  Common bile duct:  Normal.  Pancreas:  Normal.  Adrenal glands:  Normal bilaterally.  Kidneys: Normal enhancement. Delayed excretion of contrast from the kidneys is normal. Both ureters are within normal limits.  Stomach: Proximal stomach is collapsed. No mural thickening or mural mass lesion.  Small bowel: Duodenum appears normal. Mild distension of small bowel at the duodenal jejunal junction without obstruction. Distal small bowel appears collapsed and there is no obstruction leading to the ileocolonic anastomosis.  Colon: RIGHT hemicolectomy. Anastomotic staple line is present in the central abdomen (image 37 series 2). The distal colon is mostly collapsed. No mass lesion.  Pelvic Genitourinary:  Urinary bladder appears normal.  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 Compared to the prior exam, these now demonstrates small locules of gas, which may represent either fistulization with small bowel or necrosis. Abscess is difficult to completely exclude based on the presence of gas which was not on the prior CT 10/07/2014.  Unchanged rectovesical implant. Small implants in the LEFT pericolic gutter near the splenic flexure appears slightly larger than on  the prior exam of 10/07/2014. In aggregate, these 2 nodules measures 17 mm x 12 mm.  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  Contrast is present in the small bowel but has not yet reached the residual colon. This makes assessment of fistula difficult. A reasonable option would be to return the patient for CT scanning in 12 hours to see if any oral contrast is present in the metastatic lesion that would indicate fistulization. Given the advanced disease, this decision should be made based on whether it will affect clinical management.  Vasculature: Pulmonary vessels at the lung bases appear within normal limits. No acute vascular abnormality.  Body Wall: Metastatic implants are present along the surgical incision in the midline. The largest of these has increased from February 2016, measuring 20 mm x 35 mm (image 41 series 2). Unchanged fat containing RIGHT inguinal hernia.  IMPRESSION: 1. Negative for bowel obstruction. Minimal increase in mucinous colon cancer metastatic burden in the peritoneum, largest in the RIGHT hemicolectomy bed. 2. In the largest peritoneal lesion in the RIGHT abdomen, locules of  gas have developed within the tumor. The differential considerations are fistulization with small bowel or tumor necrosis with pockets of gas. Although abscess is a possibility, the relative lack of change in the configuration compared to the prior exam from February makes an abscess less likely. 3. Stable liver metastases.   Electronically Signed   By: Dereck Ligas M.D.   On: 12/12/2014 13:45   Dg Abd Acute W/chest  12/12/2014   CLINICAL DATA:  Abdominal pain, nausea, vomiting, fever for 3 days. Colon cancer.  EXAM: DG ABDOMEN ACUTE W/ 1V CHEST  COMPARISON:  11/02/2014  FINDINGS: Very low lung volumes with bibasilar atelectasis. Stable right jugular Port-A-Cath. Prominent  cardiac silhouette likely due to low volumes. No pneumothorax.  No free intraperitoneal gas. Nonspecific right upper quadrant air-fluid levels. There is 1 distended small bowel loop in the left upper quadrant. Postoperative changes with bowel staples are noted  IMPRESSION: There is 1 dilated small bowel loop associated with air-fluid levels. Developing partial small bowel obstruction is not excluded.  No free intraperitoneal gas  Bibasilar atelectasis.   Electronically Signed   By: Marybelle Killings M.D.   On: 12/12/2014 10:15     Anti-infectives:   Anti-infectives    None      Alphonsa Overall, MD, FACS Pager: [phone] Surgery Office: [phone] 12/13/2014

## 2014-12-14 ENCOUNTER — Inpatient Hospital Stay (HOSPITAL_COMMUNITY): Payer: Medicare Other

## 2014-12-14 DIAGNOSIS — R197 Diarrhea, unspecified: Secondary | ICD-10-CM

## 2014-12-14 LAB — BASIC METABOLIC PANEL
Anion gap: 8 (ref 5–15)
BUN: 10 mg/dL (ref 6–23)
CO2: 27 mmol/L (ref 19–32)
Calcium: 8.7 mg/dL (ref 8.4–10.5)
Chloride: 99 mmol/L (ref 96–112)
Creatinine, Ser: 0.95 mg/dL (ref 0.50–1.35)
GFR calc Af Amer: >90 mL/min (ref >90)
GFR calc non Af Amer: >90 mL/min (ref >90)
Glucose, Bld: 104 mg/dL — ABNORMAL HIGH (ref 70–99)
Potassium: 3.3 mmol/L — ABNORMAL LOW (ref 3.5–5.1)
Sodium: 134 mmol/L — ABNORMAL LOW (ref 135–145)

## 2014-12-14 LAB — CBC
HCT: 36.2 % — ABNORMAL LOW (ref 39.0–52.0)
Hemoglobin: 11.3 g/dL — ABNORMAL LOW (ref 13.0–17.0)
MCH: 24.5 pg — ABNORMAL LOW (ref 26.0–34.0)
MCHC: 31.2 g/dL (ref 30.0–36.0)
MCV: 78.5 fL (ref 78.0–100.0)
Platelets: 264 10*3/uL (ref 150–400)
RBC: 4.61 MIL/uL (ref 4.22–5.81)
RDW: 16.7 % — ABNORMAL HIGH (ref 11.5–15.5)
WBC: 3.6 10*3/uL — ABNORMAL LOW (ref 4.0–10.5)

## 2014-12-14 MED ORDER — DRONABINOL 2.5 MG PO CAPS
5.0000 mg | ORAL_CAPSULE | Freq: Two times a day (BID) | ORAL | Status: DC
  Administered 2014-12-14 – 2014-12-15 (×2): 5 mg via ORAL
  Filled 2014-12-14 (×5): qty 2

## 2014-12-14 MED ORDER — LIP MEDEX EX OINT
TOPICAL_OINTMENT | CUTANEOUS | Status: AC
  Administered 2014-12-14: 16:00:00
  Filled 2014-12-14: qty 7

## 2014-12-14 MED ORDER — PROMETHAZINE HCL 25 MG/ML IJ SOLN
12.5000 mg | Freq: Four times a day (QID) | INTRAMUSCULAR | Status: DC | PRN
  Administered 2014-12-14 – 2014-12-15 (×4): 12.5 mg via INTRAVENOUS
  Filled 2014-12-14 (×4): qty 1

## 2014-12-14 MED ORDER — POTASSIUM CHLORIDE 10 MEQ/100ML IV SOLN
10.0000 meq | INTRAVENOUS | Status: AC
Start: 2014-12-14 — End: 2014-12-14
  Administered 2014-12-14 (×4): 10 meq via INTRAVENOUS
  Filled 2014-12-14 (×4): qty 100

## 2014-12-14 NOTE — Progress Notes (Signed)
Patient ID: David Conrad, male   DOB: 01-10-1971, 44 y.o.   MRN: [id number]     [address] [place], [place] [address]    Phone: [phone] FAX: [phone]     Subjective: Denies nausea or vomiting.  Passing flatus.  Had a BM.  Some abdominal pain. AXR reviewed, contrast in colon.    Objective:  Vital signs:  Filed Vitals:   12/13/14 0615 12/13/14 1422 12/13/14 2219 12/14/14 0559  BP: 112/78 118/74 112/62 103/65  Pulse: 59 63 64 65  Temp: 98.1 F (36.7 C) 98.6 F (37 C) 98.2 F (36.8 C) 98.2 F (36.8 C)  TempSrc: Oral Oral Oral Oral  Resp: 16 16 16 15   Height:      Weight:      SpO2: 100% 97% 98% 99%    Last BM Date: 12/12/14  Intake/Output   Yesterday:  04/24 0701 - 04/25 0700 In: 5643 [P.O.:840; I.V.:2400] Out: -  This shift: I/O last 3 completed shifts: In: 3295 [P.O.:840; I.V.:3600] Out: -     Physical Exam: General: Pt awake/alert/oriented x4 in no acute distress Abdomen: Soft.  Nondistended. Mildly tender mid to low abdomen.  No evidence of peritonitis.  No incarcerated hernias.  Problem List:   Principal Problem:   Nausea vomiting and diarrhea Active Problems:   Colon cancer, ascending, pT3, pN2a (6/18 LN) , pMX    Hypokalemia   Abdominal pain   Bowel obstruction   Metastasis from colon cancer    Results:   Labs: Results for orders placed or performed during the hospital encounter of 12/12/14 (from the past 48 hour(s))  Urinalysis, Routine w reflex microscopic     Status: Abnormal   Collection Time: 12/12/14  1:11 PM  Result Value Ref Range   Color, Urine YELLOW YELLOW   APPearance CLEAR CLEAR   Specific Gravity, Urine >1.030 (H) 1.005 - 1.030   pH 6.0 5.0 - 8.0   Glucose, UA NEGATIVE NEGATIVE mg/dL   Hgb urine dipstick NEGATIVE NEGATIVE   Bilirubin Urine NEGATIVE NEGATIVE   Ketones, ur 15 (A) NEGATIVE mg/dL   Protein, ur NEGATIVE NEGATIVE mg/dL   Urobilinogen, UA 0.2 0.0 - 1.0 mg/dL   Nitrite NEGATIVE NEGATIVE   Leukocytes, UA NEGATIVE NEGATIVE    Comment: MICROSCOPIC NOT DONE ON URINES WITH NEGATIVE PROTEIN, BLOOD, LEUKOCYTES, NITRITE, OR GLUCOSE <1000 mg/dL.  Clostridium Difficile by PCR     Status: None   Collection Time: 12/12/14 10:12 PM  Result Value Ref Range   C difficile by pcr NEGATIVE NEGATIVE  Comprehensive metabolic panel     Status: Abnormal   Collection Time: 12/13/14  5:40 AM  Result Value Ref Range   Sodium 137 135 - 145 mmol/L   Potassium 3.7 3.5 - 5.1 mmol/L   Chloride 104 96 - 112 mmol/L   CO2 27 19 - 32 mmol/L   Glucose, Bld 101 (H) 70 - 99 mg/dL   BUN 13 6 - 23 mg/dL   Creatinine, Ser 0.86 0.50 - 1.35 mg/dL   Calcium 8.6 8.4 - 10.5 mg/dL   Total Protein 6.4 6.0 - 8.3 g/dL   Albumin 2.9 (L) 3.5 - 5.2 g/dL   AST 30 0 - 37 U/L   ALT 71 (H) 0 - 53 U/L   Alkaline Phosphatase 76 39 - 117 U/L   Total Bilirubin 0.6 0.3 - 1.2 mg/dL   GFR calc non Af Amer >90 >  90 mL/min   GFR calc Af Amer >90 >90 mL/min    Comment: (NOTE) The eGFR has been calculated using the CKD EPI equation. This calculation has not been validated in all clinical situations. eGFR's persistently <90 mL/min signify possible Chronic Kidney Disease.    Anion gap 6 5 - 15  CBC     Status: Abnormal   Collection Time: 12/13/14  5:40 AM  Result Value Ref Range   WBC 5.2 4.0 - 10.5 K/uL   RBC 4.43 4.22 - 5.81 MIL/uL   Hemoglobin 10.6 (L) 13.0 - 17.0 g/dL   HCT 34.9 (L) 39.0 - 52.0 %   MCV 78.8 78.0 - 100.0 fL   MCH 23.9 (L) 26.0 - 34.0 pg   MCHC 30.4 30.0 - 36.0 g/dL   RDW 17.1 (H) 11.5 - 15.5 %   Platelets 297 150 - 400 K/uL  CBC     Status: Abnormal   Collection Time: 12/14/14  4:08 AM  Result Value Ref Range   WBC 3.6 (L) 4.0 - 10.5 K/uL   RBC 4.61 4.22 - 5.81 MIL/uL   Hemoglobin 11.3 (L) 13.0 - 17.0 g/dL   HCT 36.2 (L) 39.0 - 52.0 %   MCV 78.5 78.0 - 100.0 fL   MCH 24.5 (L) 26.0 - 34.0 pg   MCHC 31.2 30.0 - 36.0 g/dL   RDW  16.7 (H) 11.5 - 15.5 %   Platelets 264 150 - 400 K/uL  Basic metabolic panel     Status: Abnormal   Collection Time: 12/14/14  4:08 AM  Result Value Ref Range   Sodium 134 (L) 135 - 145 mmol/L   Potassium 3.3 (L) 3.5 - 5.1 mmol/L   Chloride 99 96 - 112 mmol/L   CO2 27 19 - 32 mmol/L   Glucose, Bld 104 (H) 70 - 99 mg/dL   BUN 10 6 - 23 mg/dL   Creatinine, Ser 0.95 0.50 - 1.35 mg/dL   Calcium 8.7 8.4 - 10.5 mg/dL   GFR calc non Af Amer >90 >90 mL/min   GFR calc Af Amer >90 >90 mL/min    Comment: (NOTE) The eGFR has been calculated using the CKD EPI equation. This calculation has not been validated in all clinical situations. eGFR's persistently <90 mL/min signify possible Chronic Kidney Disease.    Anion gap 8 5 - 15    Imaging / Studies: Ct Abdomen Pelvis Wo Contrast  12/13/2014   CLINICAL DATA:  Current history of metastatic colon cancer, admitted earlier same date with nausea, vomiting and diarrhea which began after chemotherapy 2 days ago. CT abdomen and pelvis earlier same date showed increase in metastatic burden. Repeat CT requested to evaluate progression of contrast through the GI tract to exclude obstruction.  EXAM: CT ABDOMEN AND PELVIS WITHOUT CONTRAST 2316 hr  TECHNIQUE: Multidetector CT imaging of the abdomen and pelvis was performed following the standard protocol without IV contrast.  COMPARISON:  Enhanced CT abdomen and pelvis performed earlier same date 1252 hr, 10/07/2014 and earlier.  FINDINGS: The ingested oral contrast has progressed throughout the small bowel and is present throughout the colon from the ileocolic anastomosis to the rectum. There is no evidence of small bowel or colonic obstruction. Scattered diverticula are present involving the distal descending colon and the sigmoid colon without evidence of acute diverticulitis.  The contrast filled urinary bladder is unremarkable.  IMPRESSION: 1. No evidence of small bowel or colonic obstruction. The oral contrast  admitted for the CT approximately 10-1/2 hr  earlier has progressed throughout the small bowel and is present in the colon. 2. Please see the report of the enhanced CT abdomen and pelvis performed earlier same date for details of abdominal metastatic disease.   Electronically Signed   By: Evangeline Dakin M.D.   On: 12/13/2014 07:34   Ct Abdomen Pelvis W Contrast  12/12/2014   CLINICAL DATA:  Colon cancer. Nausea and vomiting. Vomiting and diarrhea with onset of symptoms on Thursday. Chemotherapy on Thursday. Intractable vomiting.  EXAM: CT ABDOMEN AND PELVIS WITH CONTRAST  TECHNIQUE: Multidetector CT imaging of the abdomen and pelvis was performed using the standard protocol following bolus administration of intravenous contrast.  CONTRAST:  49m OMNIPAQUE IOHEXOL 300 MG/ML SOLN, 1089mOMNIPAQUE IOHEXOL 300 MG/ML SOLN  COMPARISON:  10/07/2014.  FINDINGS: Musculoskeletal: RIGHT sacroiliac joint ankylosis. No aggressive osseous lesion. Benign bone island in the RIGHT ischium.  Lung Bases: Mild dependent atelectasis and/or scarring. Little interval change. No airspace consolidation in this patient undergoing chemotherapy.  Liver: Subcapsular RIGHT hepatic lobe metastases with probable small mucinous metastasis in the lateral RIGHT hepatic lobe. Tiny low-density lesions in the RIGHT hepatic dome are nonspecific. Overall, the appearance of the liver is unchanged compared 04/09/2014. No new metastatic disease.  Spleen:  Normal.  Gallbladder:  Normal.  Common bile duct:  Normal.  Pancreas:  Normal.  Adrenal glands:  Normal bilaterally.  Kidneys: Normal enhancement. Delayed excretion of contrast from the kidneys is normal. Both ureters are within normal limits.  Stomach: Proximal stomach is collapsed. No mural thickening or mural mass lesion.  Small bowel: Duodenum appears normal. Mild distension of small bowel at the duodenal jejunal junction without obstruction. Distal small bowel appears collapsed and there is no  obstruction leading to the ileocolonic anastomosis.  Colon: RIGHT hemicolectomy. Anastomotic staple line is present in the central abdomen (image 37 series 2). The distal colon is mostly collapsed. No mass lesion.  Pelvic Genitourinary:  Urinary bladder appears normal.  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 Compared to the prior exam, these now demonstrates small locules of gas, which may represent either fistulization with small bowel or necrosis. Abscess is difficult to completely exclude based on the presence of gas which was not on the prior CT 10/07/2014.  Unchanged rectovesical implant. Small implants in the LEFT pericolic gutter near the splenic flexure appears slightly larger than on the prior exam of 10/07/2014. In aggregate, these 2 nodules measures 17 mm x 12 mm.  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  Contrast is present in the small bowel but has not yet reached the residual colon. This makes assessment of fistula difficult. A reasonable option would be to return the patient for CT scanning in 12 hours to see if any oral contrast is present in the metastatic lesion that would indicate fistulization. Given the advanced disease, this decision should be made based on whether it will affect clinical management.  Vasculature: Pulmonary vessels at the lung bases appear within normal limits. No acute vascular abnormality.  Body Wall: Metastatic implants are present along the surgical incision in the midline. The largest of these has increased from February 2016, measuring 20 mm x 35 mm (image 41 series 2). Unchanged fat containing RIGHT inguinal hernia.  IMPRESSION: 1. Negative for bowel obstruction. Minimal increase in mucinous colon cancer metastatic burden in  the peritoneum, largest in the  RIGHT hemicolectomy bed. 2. In the largest peritoneal lesion in the RIGHT abdomen, locules of gas have developed within the tumor. The differential considerations are fistulization with small bowel or tumor necrosis with pockets of gas. Although abscess is a possibility, the relative lack of change in the configuration compared to the prior exam from February makes an abscess less likely. 3. Stable liver metastases.   Electronically Signed   By: Dereck Ligas M.D.   On: 12/12/2014 13:45   Dg Abd 2 Views  12/14/2014   CLINICAL DATA:  History of colon cancer. Status post right hemicolectomy. Nausea and vomiting. Evaluate for bowel obstruction.  EXAM: ABDOMEN - 2 VIEW  COMPARISON:  None.  FINDINGS: The enteric contrast material from 4/ 23/16 has progressed to the descending colon and rectum. Gaseous distension of the sigmoid colon is noted. No free air. No dilated loops of small bowel noted.  IMPRESSION: 1. Continued antegrade progression of enteric contrast material up to the level of the rectum without evidence for bowel obstruction.   Electronically Signed   By: Kerby Moors M.D.   On: 12/14/2014 08:40    Medications / Allergies:  Scheduled Meds: . dronabinol  5 mg Oral BID AC  . escitalopram  20 mg Oral Daily  . morphine  30 mg Oral BID  . potassium chloride  10 mEq Intravenous Q1 Hr x 4  . vitamin B-6  250 mg Oral Daily   Continuous Infusions: . 0.9 % NaCl with KCl 20 mEq / L 75 mL/hr at 12/14/14 0838   PRN Meds:.acetaminophen **OR** acetaminophen, HYDROmorphone (DILAUDID) injection, LORazepam, ondansetron **OR** ondansetron (ZOFRAN) IV  Antibiotics: Anti-infectives    None        Assessment/Plan Metastatic colon cancer Nausea and vomiting  AXR with contrast in the colon. I discussed this with the patient.  He is having bowel movements.  Received zofran earlier today, but denies symptoms.  May advance diet as tolerated.  Further management per primary team and oncology.  Please  call [facility] for further assistance.     Erby Pian, [facility] Surgery Pager [phone])   12/14/2014 10:38 AM

## 2014-12-14 NOTE — Progress Notes (Signed)
OT Cancellation Note  Patient Details Name: David Conrad MRN: [id number] DOB: 1970-11-16   Cancelled Treatment:    Reason Eval/Treat Not Completed: OT screened, no needs identified, will sign off. Spoke with pt and he states he is getting up to the bathroom without difficulty and doesn't have any ADL concerns at present. He states he has to go slowly but is able to perform. Will sign off per pt request.  Jules Schick  [phone] 12/14/2014, 9:52 AM

## 2014-12-14 NOTE — Progress Notes (Signed)
[facility] PROGRESS NOTE  David Conrad CBS:[id number] DOB: [birth year] DOA: 12/12/2014  PCP: No PCP Per Patient  Brief HPI: 44 year old male with a history of metastatic colon cancer on chemotherapy, presented with nausea, vomiting, abdominal pain. Last chemotherapy was on April 19. CT scan showed possible tumor necrosis. He was admitted for further management  Past medical history:  Past Medical History  Diagnosis Date  . Anemia   . Cancer 10/13 and 2015    colon ca  . History of colon resection     Consultants: Gen. surgery, oncology  Procedures: None  Antibiotics: None  Subjective: Patient feels nauseous this morning. Abdominal pain is about the same. No vomiting since yesterday. Tolerated his clear liquids.   Objective: Vital Signs  Filed Vitals:   12/13/14 0615 12/13/14 1422 12/13/14 2219 12/14/14 0559  BP: 112/78 118/74 112/62 103/65  Pulse: 59 63 64 65  Temp: 98.1 F (36.7 C) 98.6 F (37 C) 98.2 F (36.8 C) 98.2 F (36.8 C)  TempSrc: Oral Oral Oral Oral  Resp: 16 16 16 15   Height:      Weight:      SpO2: 100% 97% 98% 99%    Intake/Output Summary (Last 24 hours) at 12/14/14 0801 Last data filed at 12/14/14 0600  Gross per 24 hour  Intake   3240 ml  Output      0 ml  Net   3240 ml   Filed Weights   12/12/14 1759  Weight: 96.4 kg (212 lb 8.4 oz)    General appearance: alert, cooperative, appears stated age, no distress and Anxious Resp: clear to auscultation bilaterally Cardio: regular rate and rhythm, S1, S2 normal, no murmur, click, rub or gallop GI: Soft. Mildly Tender diffusely without any rebound, rigidity or guarding. No masses or organomegaly. Bowel sounds present. Extremities: extremities normal, atraumatic, no cyanosis or edema Neurologic: Alert and oriented 3. No focal neurological deficits.  Lab Results:  Basic Metabolic Panel:  Recent Labs Lab 12/08/14 0912 12/12/14 0911 12/13/14 0540 12/14/14 0408  NA 141 137  137 134*  K 3.4 3.4* 3.7 3.3*  CL 100 104 104 99  CO2 30 23 27 27   GLUCOSE 108 136* 101* 104*  BUN 10 19 13 10   CREATININE 1.0 0.87 0.86 0.95  CALCIUM 9.9 9.3 8.6 8.7   Liver Function Tests:  Recent Labs Lab 12/08/14 0912 12/12/14 0911 12/13/14 0540  AST 18 56* 30  ALT 15 92* 71*  ALKPHOS 85* 93 76  BILITOT 0.90 0.7 0.6  PROT 8.5* 7.7 6.4  ALBUMIN  --  3.6 2.9*    Recent Labs Lab 12/12/14 0911  LIPASE 22   CBC:  Recent Labs Lab 12/08/14 0912 12/12/14 0911 12/13/14 0540 12/14/14 0408  WBC 6.1 5.8 5.2 3.6*  NEUTROABS 2.9 4.4  --   --   HGB 11.9* 11.9* 10.6* 11.3*  HCT 38.0* 37.9* 34.9* 36.2*  MCV 79* 77.5* 78.8 78.5  PLT 371 366 297 264    Recent Results (from the past 240 hour(s))  Clostridium Difficile by PCR     Status: None   Collection Time: 12/12/14 10:12 PM  Result Value Ref Range Status   C difficile by pcr NEGATIVE NEGATIVE Final      Studies/Results: Ct Abdomen Pelvis Wo Contrast  12/13/2014   CLINICAL DATA:  Current history of metastatic colon cancer, admitted earlier same date with nausea, vomiting and diarrhea which began after chemotherapy 2 days ago. CT abdomen and pelvis earlier same  date showed increase in metastatic burden. Repeat CT requested to evaluate progression of contrast through the GI tract to exclude obstruction.  EXAM: CT ABDOMEN AND PELVIS WITHOUT CONTRAST 2316 hr  TECHNIQUE: Multidetector CT imaging of the abdomen and pelvis was performed following the standard protocol without IV contrast.  COMPARISON:  Enhanced CT abdomen and pelvis performed earlier same date 1252 hr, 10/07/2014 and earlier.  FINDINGS: The ingested oral contrast has progressed throughout the small bowel and is present throughout the colon from the ileocolic anastomosis to the rectum. There is no evidence of small bowel or colonic obstruction. Scattered diverticula are present involving the distal descending colon and the sigmoid colon without evidence of acute  diverticulitis.  The contrast filled urinary bladder is unremarkable.  IMPRESSION: 1. No evidence of small bowel or colonic obstruction. The oral contrast admitted for the CT approximately 10-1/2 hr earlier has progressed throughout the small bowel and is present in the colon. 2. Please see the report of the enhanced CT abdomen and pelvis performed earlier same date for details of abdominal metastatic disease.   Electronically Signed   By: Evangeline Dakin M.D.   On: 12/13/2014 07:34   Ct Abdomen Pelvis W Contrast  12/12/2014   CLINICAL DATA:  Colon cancer. Nausea and vomiting. Vomiting and diarrhea with onset of symptoms on Thursday. Chemotherapy on Thursday. Intractable vomiting.  EXAM: CT ABDOMEN AND PELVIS WITH CONTRAST  TECHNIQUE: Multidetector CT imaging of the abdomen and pelvis was performed using the standard protocol following bolus administration of intravenous contrast.  CONTRAST:  26mL OMNIPAQUE IOHEXOL 300 MG/ML SOLN, 115mL OMNIPAQUE IOHEXOL 300 MG/ML SOLN  COMPARISON:  10/07/2014.  FINDINGS: Musculoskeletal: RIGHT sacroiliac joint ankylosis. No aggressive osseous lesion. Benign bone island in the RIGHT ischium.  Lung Bases: Mild dependent atelectasis and/or scarring. Little interval change. No airspace consolidation in this patient undergoing chemotherapy.  Liver: Subcapsular RIGHT hepatic lobe metastases with probable small mucinous metastasis in the lateral RIGHT hepatic lobe. Tiny low-density lesions in the RIGHT hepatic dome are nonspecific. Overall, the appearance of the liver is unchanged compared 04/09/2014. No new metastatic disease.  Spleen:  Normal.  Gallbladder:  Normal.  Common bile duct:  Normal.  Pancreas:  Normal.  Adrenal glands:  Normal bilaterally.  Kidneys: Normal enhancement. Delayed excretion of contrast from the kidneys is normal. Both ureters are within normal limits.  Stomach: Proximal stomach is collapsed. No mural thickening or mural mass lesion.  Small bowel: Duodenum  appears normal. Mild distension of small bowel at the duodenal jejunal junction without obstruction. Distal small bowel appears collapsed and there is no obstruction leading to the ileocolonic anastomosis.  Colon: RIGHT hemicolectomy. Anastomotic staple line is present in the central abdomen (image 37 series 2). The distal colon is mostly collapsed. No mass lesion.  Pelvic Genitourinary:  Urinary bladder appears normal.  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 Compared to the prior exam, these now demonstrates small locules of gas, which may represent either fistulization with small bowel or necrosis. Abscess is difficult to completely exclude based on the presence of gas which was not on the prior CT 10/07/2014.  Unchanged rectovesical implant. Small implants in the LEFT pericolic gutter near the splenic flexure appears slightly larger than on the prior exam of 10/07/2014. In aggregate, these 2 nodules measures 17 mm x 12 mm.  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  Contrast is present in the small bowel but has not yet reached the residual colon. This makes assessment of fistula difficult. A reasonable option would be to return the patient for CT scanning in 12 hours to see if any oral contrast is present in the metastatic lesion that would indicate fistulization. Given the advanced disease, this decision should be made based on whether it will affect clinical management.  Vasculature: Pulmonary vessels at the lung bases appear within normal limits. No acute vascular abnormality.  Body Wall: Metastatic implants are present along the surgical incision in the midline. The largest of these has increased from February 2016, measuring 20 mm x 35 mm (image 41 series 2). Unchanged fat containing RIGHT  inguinal hernia.  IMPRESSION: 1. Negative for bowel obstruction. Minimal increase in mucinous colon cancer metastatic burden in the peritoneum, largest in the RIGHT hemicolectomy bed. 2. In the largest peritoneal lesion in the RIGHT abdomen, locules of gas have developed within the tumor. The differential considerations are fistulization with small bowel or tumor necrosis with pockets of gas. Although abscess is a possibility, the relative lack of change in the configuration compared to the prior exam from February makes an abscess less likely. 3. Stable liver metastases.   Electronically Signed   By: Dereck Ligas M.D.   On: 12/12/2014 13:45   Dg Abd Acute W/chest  12/12/2014   CLINICAL DATA:  Abdominal pain, nausea, vomiting, fever for 3 days. Colon cancer.  EXAM: DG ABDOMEN ACUTE W/ 1V CHEST  COMPARISON:  11/02/2014  FINDINGS: Very low lung volumes with bibasilar atelectasis. Stable right jugular Port-A-Cath. Prominent cardiac silhouette likely due to low volumes. No pneumothorax.  No free intraperitoneal gas. Nonspecific right upper quadrant air-fluid levels. There is 1 distended small bowel loop in the left upper quadrant. Postoperative changes with bowel staples are noted  IMPRESSION: There is 1 dilated small bowel loop associated with air-fluid levels. Developing partial small bowel obstruction is not excluded.  No free intraperitoneal gas  Bibasilar atelectasis.   Electronically Signed   By: Marybelle Killings M.D.   On: 12/12/2014 10:15    Medications:  Scheduled: . dronabinol  5 mg Oral BID AC  . escitalopram  20 mg Oral Daily  . morphine  30 mg Oral BID  . potassium chloride  10 mEq Intravenous Q1 Hr x 4  . vitamin B-6  250 mg Oral Daily   Continuous: . 0.9 % NaCl with KCl 20 mEq / L 100 mL/hr at 12/13/14 0400   MGQ:[id number] **OR** acetaminophen, HYDROmorphone (DILAUDID) injection, LORazepam, ondansetron **OR** ondansetron (ZOFRAN) IV  Assessment/Plan:  Principal Problem:   Nausea  vomiting and diarrhea Active Problems:   Colon cancer, ascending, pT3, pN2a (6/18 LN) , pMX    Hypokalemia   Abdominal pain   Bowel obstruction   Metastasis from colon cancer    Nausea, vomiting and abdominal pain CT scan report as above. No small bowel obstruction was noted but there was evidence of possible tumor necrosis. General surgery was consulted and their input is appreciated. No indication for urgent intervention. Patient previously has declined surgery at [facility]. He remained stable. Has tolerated clear liquids. We will advance to full liquids. CT scan was repeated to see if contrast went through and indeed no obstruction was noted. X-ray was done this morning as well, which also shows contrast going through to the colon. His symptoms could have been triggered by chemotherapy.  Acute diarrhea  C. difficile negative. No further blood in the stool. Keep him hydrated. Monitor for hematochezia. Check hemoglobins regularly.  Metastatic colon cancer on chemotherapy Last chemotherapy was on April 19. Patient has significant tumor burden in his abdomen. Plan was for the patient to go to [facility] to consider surgery and intraperitoneal chemotherapy. Apparently surgery was scheduled, but the patient canceled it as he was not certain. Oncologist is following. Started on Marinol.  Hypokalemia Will be repleted.  Microcytic anemia Hemoglobin close to baseline. Did drop some due to dilution. Continue to monitor. Transfuse as needed, though no indication currently.  History of depression Continue Lexapro.  DVT Prophylaxis: Only on SCDs due to mention of blood in stool.    Code Status: Full code  Family Communication: Discussed with the patient  Disposition Plan: Improving slowly. Advance diet today. Oncology following.    LOS: 2 days   [facility] Pager [phone] 12/14/2014, 8:01 AM  If 7PM-7AM, please contact night-coverage at [email or web address],  password [facility]

## 2014-12-14 NOTE — Progress Notes (Signed)
I appreciate the help of everybody involved. This is been an incredibly difficult situation. My note from April 20 was very prophetic and that I thought that you would be back in the hospital with a bowel obstruction. It is hard to say if he does have a bowel obstruction. I would repeat his abdominal x-ray today.  It's probable that the nausea and vomiting could have come from the chemotherapy. Typically when he got typically causes diarrhea. However, it is certainly conceivable that it may be the source for the nausea and vomiting.  I understand that there was an issue with one of the surgeons. I am sure that he meant well and was just trying to help Mr. Vanwey understand the limitations that the surgeons have in town here. I, myself, have always felt that aggressive debulking and intraperitoneal chemotherapy would be the best way to treat him. However, Mr. Sweetin just is not mentally ready to undertake that. I fear that the "window of opportunity" is going to close on that procedure.  I suppose that he may have had food poisoning. This nausea and vomiting seem to happen fairly quickly after chemotherapy. If we are to treat him again, I will have to make sure that he gets atropine and also a different antibiotic regimen.  He actually looks pretty good. I'm pretty impressed with how he feels. He is on clear liquids. May be that we'll be increased a little bit.  All of his vital signs look stable. Temperature 98.2. Pulse 65. Blood pressure 103/65. His abdomen is not distended. Bowel sounds are decreased. There may be some slight decrease to palpation in the lower quadrants. There is no palpable abdominal mass. He has no palpable hepatosplenomegaly. Extremities show no clubbing, cyanosis or edema. Lungs are clear. Cardiac exam regular rate and rhythm.  His labs don't look all that bad.  We will see what the abdominal x-ray looks like today. We will see how he does if his diet is increased.  I think  there is some Marinol may help him.  He is on Celexa. Again there is a major component of depression that he is trying to deal with.  Pete E.  Ephesians 3:20

## 2014-12-15 ENCOUNTER — Inpatient Hospital Stay (HOSPITAL_COMMUNITY): Payer: Medicare Other

## 2014-12-15 LAB — COMPREHENSIVE METABOLIC PANEL
ALBUMIN: 3 g/dL — AB (ref 3.5–5.2)
ALT: 46 U/L (ref 0–53)
ANION GAP: 8 (ref 5–15)
AST: 17 U/L (ref 0–37)
Alkaline Phosphatase: 82 U/L (ref 39–117)
BILIRUBIN TOTAL: 0.7 mg/dL (ref 0.3–1.2)
BUN: 8 mg/dL (ref 6–23)
CO2: 25 mmol/L (ref 19–32)
Calcium: 8.7 mg/dL (ref 8.4–10.5)
Chloride: 103 mmol/L (ref 96–112)
Creatinine, Ser: 0.83 mg/dL (ref 0.50–1.35)
GFR calc Af Amer: >90 mL/min (ref >90)
GLUCOSE: 105 mg/dL — AB (ref 70–99)
Potassium: 4.1 mmol/L (ref 3.5–5.1)
SODIUM: 136 mmol/L (ref 135–145)
Total Protein: 6.5 g/dL (ref 6.0–8.3)

## 2014-12-15 LAB — CBC
HCT: 35.6 % — ABNORMAL LOW (ref 39.0–52.0)
Hemoglobin: 11.1 g/dL — ABNORMAL LOW (ref 13.0–17.0)
MCH: 24.2 pg — ABNORMAL LOW (ref 26.0–34.0)
MCHC: 31.2 g/dL (ref 30.0–36.0)
MCV: 77.7 fL — ABNORMAL LOW (ref 78.0–100.0)
Platelets: 240 10*3/uL (ref 150–400)
RBC: 4.58 MIL/uL (ref 4.22–5.81)
RDW: 16.7 % — ABNORMAL HIGH (ref 11.5–15.5)
WBC: 3.2 10*3/uL — ABNORMAL LOW (ref 4.0–10.5)

## 2014-12-15 NOTE — Progress Notes (Signed)
Mr. Turpen is doing okay. He had an abdominal film yesterday. It did not show any obvious obstruction. I will repeat one today.  His nausea and vomiting seem to have resolved. He is taking full liquids. Maybe his diet can be increased a little bit more.  He has not had a problems with diarrhea.   he is out of bed a little bit. He is ambulating.  He's had no bleeding. He's had no leg swelling.  His IV fluids might be able to be cut back a little bit.  There is no abdominal distention.  On his physical exam, all of his vital signs are stable. Temperature 97.8. Blood pressure 113/63. Pulse is 64. Head and neck exam shows no ocular or oral lesions. There are no palpable cervical or supraclavicular lymph nodes. Lungs are clear. Cardiac exam regular rate and rhythm. Abdomen is soft. There is no obvious distention. Bowel sounds are little bit decreased but present. There is no guarding or rebound tenderness. Extremities shows no clubbing, cyanosis or edema.  His labs look okay. White cell count is down a little bit from the chemotherapy.  Again, hopefully his diet can be increased.  I will think that if all is good today, he might be able to go home tomorrow.  Stormy Card 3:23

## 2014-12-15 NOTE — Progress Notes (Addendum)
[facility] PROGRESS NOTE  David Conrad DUK:[id number] DOB: 04-Aug-1971 DOA: 12/12/2014  Brief HPI: 44 year old male with a history of metastatic colon cancer on chemotherapy, presented with nausea, vomiting, abdominal pain. Last chemotherapy was on April 19. CT scan showed possible tumor necrosis. He was admitted for further management. He was seen by general surgery and by oncology. There was no evidence for obstruction. He started improving. Diet is being advanced.  Past medical history:  Past Medical History  Diagnosis Date  . Anemia   . Cancer 10/13 and 2015    colon ca  . History of colon resection     Consultants: Gen. surgery, oncology (Dr. Marin Olp)  Procedures: None  Antibiotics: None  Subjective: Patient feels better. Tolerated his full liquids yesterday. Slightly nauseous this morning, but denies any vomiting. Ready to advance his diet. Ambulating.  Objective: Vital Signs  Filed Vitals:   12/14/14 0559 12/14/14 1430 12/14/14 2129 12/15/14 0450  BP: 103/65 118/73 101/60 113/63  Pulse: 65 61 71 64  Temp: 98.2 F (36.8 C) 98.2 F (36.8 C) 98.5 F (36.9 C) 97.8 F (36.6 C)  TempSrc: Oral Oral Oral Oral  Resp: 15 16 16 16   Height:      Weight:      SpO2: 99% 99% 99% 98%    Intake/Output Summary (Last 24 hours) at 12/15/14 0848 Last data filed at 12/15/14 0300  Gross per 24 hour  Intake 2040.83 ml  Output    800 ml  Net 1240.83 ml   Filed Weights   12/12/14 1759  Weight: 96.4 kg (212 lb 8.4 oz)    General appearance: alert, cooperative, appears stated age, no distress and Anxious Resp: clear to auscultation bilaterally Cardio: regular rate and rhythm, S1, S2 normal, no murmur, click, rub or gallop GI: Soft. Remains Mildly Tender diffusely without any rebound, rigidity or guarding. No masses or organomegaly. Bowel sounds present. Extremities: extremities normal, atraumatic, no cyanosis or edema Neurologic: Alert and oriented 3. No focal  neurological deficits.  Lab Results:  Basic Metabolic Panel:  Recent Labs Lab 12/08/14 0912 12/12/14 0911 12/13/14 0540 12/14/14 0408 12/15/14 0438  NA 141 137 137 134* 136  K 3.4 3.4* 3.7 3.3* 4.1  CL 100 104 104 99 103  CO2 30 23 27 27 25   GLUCOSE 108 136* 101* 104* 105*  BUN 10 19 13 10 8   CREATININE 1.0 0.87 0.86 0.95 0.83  CALCIUM 9.9 9.3 8.6 8.7 8.7   Liver Function Tests:  Recent Labs Lab 12/08/14 0912 12/12/14 0911 12/13/14 0540 12/15/14 0438  AST 18 56* 30 17  ALT 15 92* 71* 46  ALKPHOS 85* 93 76 82  BILITOT 0.90 0.7 0.6 0.7  PROT 8.5* 7.7 6.4 6.5  ALBUMIN  --  3.6 2.9* 3.0*    Recent Labs Lab 12/12/14 0911  LIPASE 22   CBC:  Recent Labs Lab 12/08/14 0912 12/12/14 0911 12/13/14 0540 12/14/14 0408 12/15/14 0438  WBC 6.1 5.8 5.2 3.6* 3.2*  NEUTROABS 2.9 4.4  --   --   --   HGB 11.9* 11.9* 10.6* 11.3* 11.1*  HCT 38.0* 37.9* 34.9* 36.2* 35.6*  MCV 79* 77.5* 78.8 78.5 77.7*  PLT 371 366 297 264 240    Recent Results (from the past 240 hour(s))  Clostridium Difficile by PCR     Status: None   Collection Time: 12/12/14 10:12 PM  Result Value Ref Range Status   C difficile by pcr NEGATIVE NEGATIVE Final  Studies/Results: Dg Abd 2 Views  01-09-2015   CLINICAL DATA:  History of colon cancer. Status post right hemicolectomy. Nausea and vomiting. Evaluate for bowel obstruction.  EXAM: ABDOMEN - 2 VIEW  COMPARISON:  None.  FINDINGS: The enteric contrast material from 4/ 23/16 has progressed to the descending colon and rectum. Gaseous distension of the sigmoid colon is noted. No free air. No dilated loops of small bowel noted.  IMPRESSION: 1. Continued antegrade progression of enteric contrast material up to the level of the rectum without evidence for bowel obstruction.   Electronically Signed   By: Kerby Moors M.D.   On: 01-09-2015 08:40    Medications:  Scheduled: . dronabinol  5 mg Oral BID AC  . escitalopram  20 mg Oral Daily  .  morphine  30 mg Oral BID  . vitamin B-6  250 mg Oral Daily   Continuous: . 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 Tolerated liquid diet. Diet advanced to soft today. CT scan report as above. No small bowel obstruction was noted but there was evidence of possible tumor necrosis. General surgery was consulted and their input is appreciated. No indication for urgent intervention. Patient previously has declined surgery at [facility] CT scan was repeated to see if contrast went through and indeed no obstruction was noted. Abdominal x-rays also did not show any obstruction. His symptoms could have been triggered by chemotherapy.  Acute diarrhea Appears to have resolved. C. difficile negative. No further blood in the stool. Keep him hydrated. No blood in stool seen on reported. Hemoglobin is stable.  Metastatic colon cancer on chemotherapy Last chemotherapy was on April 19. Patient has significant tumor burden in his abdomen. Plan was for the patient to go to [facility] to consider surgery and intraperitoneal chemotherapy. Apparently surgery was scheduled, but the patient canceled it as he was not certain. Oncologist is following. Continue Marinol.  Hypokalemia Repleted  Microcytic anemia Hemoglobin close to baseline. Did drop some due to dilution, but now stable. Continue to monitor. Transfuse as needed, though no indication currently.  History of depression Continue Lexapro.  DVT Prophylaxis: Only on SCDs due to mention of blood  in stool.    Code Status: Full code  Family Communication: Discussed with the patient  Disposition Plan: Improving slowly. Advance diet today. Oncology following. Anticipate discharge tomorrow if he tolerates a diet. Cut back on IV fluids. Continue to mobilize.    LOS: 3 days   [facility] Pager [phone] 12/15/2014, 8:48 AM  If 7PM-7AM, please contact night-coverage at [email or web address], password [facility]

## 2014-12-16 ENCOUNTER — Inpatient Hospital Stay (HOSPITAL_COMMUNITY): Payer: Medicare Other

## 2014-12-16 DIAGNOSIS — K567 Ileus, unspecified: Secondary | ICD-10-CM

## 2014-12-16 LAB — BASIC METABOLIC PANEL
ANION GAP: 13 (ref 5–15)
BUN: 10 mg/dL (ref 6–23)
CALCIUM: 9.1 mg/dL (ref 8.4–10.5)
CO2: 25 mmol/L (ref 19–32)
CREATININE: 0.92 mg/dL (ref 0.50–1.35)
Chloride: 101 mmol/L (ref 96–112)
GFR calc Af Amer: >90 mL/min (ref >90)
GLUCOSE: 111 mg/dL — AB (ref 70–99)
Potassium: 4 mmol/L (ref 3.5–5.1)
Sodium: 139 mmol/L (ref 135–145)

## 2014-12-16 LAB — CBC
HCT: 35.6 % — ABNORMAL LOW (ref 39.0–52.0)
Hemoglobin: 11 g/dL — ABNORMAL LOW (ref 13.0–17.0)
MCH: 24.2 pg — ABNORMAL LOW (ref 26.0–34.0)
MCHC: 30.9 g/dL (ref 30.0–36.0)
MCV: 78.4 fL (ref 78.0–100.0)
PLATELETS: 254 10*3/uL (ref 150–400)
RBC: 4.54 MIL/uL (ref 4.22–5.81)
RDW: 17.1 % — ABNORMAL HIGH (ref 11.5–15.5)
WBC: 3.5 10*3/uL — AB (ref 4.0–10.5)

## 2014-12-16 NOTE — Progress Notes (Addendum)
[facility] PROGRESS NOTE  David Conrad BWG:[id number] DOB: April 03, 1971 DOA: 12/12/2014  Brief HPI: 44 year old male with a history of metastatic colon cancer on chemotherapy, presented with nausea, vomiting, abdominal pain. Last chemotherapy was on April 19. CT scan showed possible tumor necrosis. He was admitted for further management. He was seen by general surgery and by oncology. There was no evidence for obstruction. He started improving. Diet is being advanced.  Past medical history:  Past Medical History  Diagnosis Date  . Anemia   . Cancer 10/13 and 2015    colon ca  . History of colon resection     Consultants: Gen. surgery, oncology (Dr. Marin Olp)  Procedures: None  Antibiotics: None  Subjective: Patient feels better. Noted on soft diet yesterday . Reports mild nausea, but denies any vomiting. Had small loose bowel movement overnight. Ambulating.  Objective: Vital Signs  Filed Vitals:   12/15/14 1410 12/15/14 2126 12/16/14 0531 12/16/14 1406  BP: 98/65 108/69 111/67 119/70  Pulse: 66 79 68 67  Temp: 98.1 F (36.7 C) 98.4 F (36.9 C) 98 F (36.7 C) 97.9 F (36.6 C)  TempSrc: Oral Oral Oral Oral  Resp: 16 16 16 18   Height:      Weight:      SpO2: 98% 98% 100% 100%    Intake/Output Summary (Last 24 hours) at 12/16/14 1806 Last data filed at 12/16/14 1400  Gross per 24 hour  Intake   1320 ml  Output    850 ml  Net    470 ml   Filed Weights   12/12/14 1759  Weight: 96.4 kg (212 lb 8.4 oz)    General appearance: alert, cooperative, appears stated age, no distress and Anxious Resp: clear to auscultation bilaterally Cardio: regular rate and rhythm, S1, S2 normal, no murmur, click, rub or gallop GI: Soft. Remains Mildly Tender diffusely without any rebound, rigidity or guarding. No masses or organomegaly. Bowel sounds present. Extremities: extremities normal, atraumatic, no cyanosis or edema Neurologic: Alert and oriented 3. No focal  neurological deficits.  Lab Results:  Basic Metabolic Panel:  Recent Labs Lab 12/12/14 0911 12/13/14 0540 12/14/14 0408 12/15/14 0438 12/16/14 0505  NA 137 137 134* 136 139  K 3.4* 3.7 3.3* 4.1 4.0  CL 104 104 99 103 101  CO2 23 27 27 25 25   GLUCOSE 136* 101* 104* 105* 111*  BUN 19 13 10 8 10   CREATININE 0.87 0.86 0.95 0.83 0.92  CALCIUM 9.3 8.6 8.7 8.7 9.1   Liver Function Tests:  Recent Labs Lab 12/12/14 0911 12/13/14 0540 12/15/14 0438  AST 56* 30 17  ALT 92* 71* 46  ALKPHOS 93 76 82  BILITOT 0.7 0.6 0.7  PROT 7.7 6.4 6.5  ALBUMIN 3.6 2.9* 3.0*    Recent Labs Lab 12/12/14 0911  LIPASE 22   CBC:  Recent Labs Lab 12/12/14 0911 12/13/14 0540 12/14/14 0408 12/15/14 0438 12/16/14 0505  WBC 5.8 5.2 3.6* 3.2* 3.5*  NEUTROABS 4.4  --   --   --   --   HGB 11.9* 10.6* 11.3* 11.1* 11.0*  HCT 37.9* 34.9* 36.2* 35.6* 35.6*  MCV 77.5* 78.8 78.5 77.7* 78.4  PLT 366 297 264 240 254    Recent Results (from the past 240 hour(s))  Clostridium Difficile by PCR     Status: None   Collection Time: 12/12/14 10:12 PM  Result Value Ref Range Status   C difficile by pcr NEGATIVE NEGATIVE Final  Studies/Results: Dg Abd 2 Views  Dec 22, 2014   CLINICAL DATA:  Follow-up ileus  EXAM: ABDOMEN - 2 VIEW  COMPARISON:  12/15/2014  FINDINGS: Nonobstructive bowel gas pattern.  Mildly prominent but nondilated loops of small bowel in the central abdomen, suggesting small bowel ileus.  Mild residual contrast in the rectum.  No evidence of free air under the diaphragm on the upright view.  Visualized osseous structures are within normal limits.  IMPRESSION: Possible adynamic small bowel ileus.  No evidence of small bowel obstruction or free air.   Electronically Signed   By: Julian Hy M.D.   On: 12/22/14 10:21   Dg Abd 2 Views  12/15/2014   CLINICAL DATA:  Vomiting of, abdominal pain, history of colon cancer, history of previous bowel obstruction  EXAM: ABDOMEN - 2 VIEW   COMPARISON:  Abdominal series of December 14, 2014  FINDINGS: Supine and upright abdominal films reveal small air-fluid levels within the colon. There is a small amount of distended small bowel projecting over the lower lumbar spine in the midline. There is a small amount of contrast remaining in the left colon and rectum. There is no free extraluminal gas demonstrated.  IMPRESSION: Findings consistent with small and large bowel ileus. There is no evidence of perforation. There has been further distal migration of colonic contrast.   Electronically Signed   By: David  Martinique M.D.   On: 12/15/2014 13:22    Medications:  Scheduled: . dronabinol  5 mg Oral BID AC  . escitalopram  20 mg Oral Daily  . morphine  30 mg Oral BID  . vitamin B-6  250 mg Oral Daily   Continuous: . 0.9 % NaCl with KCl 20 mEq / L 30 mL/hr at 12/15/14 1006   ZOX:[id number] **OR** acetaminophen, HYDROmorphone (DILAUDID) injection, LORazepam, ondansetron **OR** ondansetron (ZOFRAN) IV, promethazine  Assessment/Plan:  Principal Problem:   Nausea vomiting and diarrhea Active Problems:   Colon cancer, ascending, pT3, pN2a (6/18 LN) , pMX    Hypokalemia   Abdominal pain   Bowel obstruction   Metastasis from colon cancer    Nausea, vomiting and abdominal pain He seems to be improving. Tolerating soft diet. Still with poor appetite. CT scan report as above. No small bowel obstruction was noted but there was evidence of possible tumor necrosis. General surgery was consulted and their input is appreciated. No indication for urgent intervention. Patient previously has declined surgery at [facility]. CT scan was repeated to see if contrast went through and indeed no obstruction was noted. Abdominal x-rays also did not show any obstruction. His symptoms could have been triggered by chemotherapy.  Acute diarrhea Appears to have resolved. C. difficile negative. No further blood in the stool. Keep him hydrated. No blood in stool seen  on reported. Hemoglobin is stable.  Metastatic colon cancer on chemotherapy Last chemotherapy was on April 19. Patient has significant tumor burden in his abdomen. Plan was for the patient to go to [facility] to consider surgery and intraperitoneal chemotherapy. Apparently surgery was scheduled, but the patient canceled it as he was not certain. Oncologist is following. Continue Marinol.  Hypokalemia Repleted  Microcytic anemia Hemoglobin close to baseline. Did drop some due to dilution, but now stable. Continue to monitor. Transfuse as needed, though no indication currently.  History of depression Continue Lexapro.  DVT Prophylaxis: Only on SCDs due to mention of blood in stool.    Code Status: Full code  Family Communication: Discussed with the patient  Disposition  Plan: Anticipate discharge tomorrow to home  LOS: 4 days   [facility], Layton Tappan MD  [facility] Pager [phone] 12/16/2014, 6:06 PM  If 7PM-7AM, please contact night-coverage at [email or web address], password [facility]

## 2014-12-16 NOTE — Care Management Note (Signed)
    Page 1 of 1   12/16/2014     12:09:22 PM CARE MANAGEMENT NOTE 12/16/2014  Patient:  David Conrad, David Conrad   Account Number:  [id number]  Date Initiated:  12/16/2014  Documentation initiated by:  Sunday Spillers  Subjective/Objective Assessment:   44 yo male admitted with bowel obstruction. PTA lived at home.     Action/Plan:   Discharge planning   Anticipated DC Date:  12/16/2014   Anticipated DC Plan:  [place]  CM consult      Choice offered to / List presented to:             Status of service:  Completed, signed off Medicare Important Message given?  YES (If response is "NO", the following Medicare IM given date fields will be blank) Date Medicare IM given:  12/16/2014 Medicare IM given by:  [facility] Date Additional Medicare IM given:   Additional Medicare IM given by:    Discharge Disposition:  HOME/SELF CARE  Per UR Regulation:  Reviewed for med. necessity/level of care/duration of stay  If discussed at [place] of Stay Meetings, dates discussed:    Comments:  12-16-14 Sunday Spillers RN CM Advancing diet, plan for OP f/u

## 2014-12-16 NOTE — Progress Notes (Signed)
Mr. Jipson did not do as well yesterday. His abdominal x-ray seemed to show an ileus. This would be very unusual. I can see this happening with him likely having peritoneal carcinomatosis. This can sometimes manifest as a pseudoobstruction type process. Again no way to really know this would be surgery.  We will see what his x-ray looks like today.  He is not complaining much in the way of abdominal pain. He is on MS Contin.  He is on clear liquids. He's a little concerned and anxious about trying to increase this.  He's had no problems with diarrhea.  He is out of bed.  He's had no fever.  There has been no bleeding.  His physical exam is pretty much unremarkable from yesterday and unchanged from yesterday. Blood pressure 111/67. Temperature 90.8. His abdomen is soft. It really is not distended. His bowel sounds are decreased but present. He has no obvious abdominal mass. He has no obvious fluid wave. There is no palpable hepatosplenomegaly extremities shows no clubbing, cyanosis or edema.  Again, we will have to see what his abdominal film looks like today.  I really appreciate all the gray care that he is getting up on [address]. I know everybody is trying their best. He still has some psychological issues that he is trying to work through in trying to deal with this situation.  Pete E.  Phillipians 4:13

## 2014-12-17 ENCOUNTER — Inpatient Hospital Stay (HOSPITAL_COMMUNITY): Payer: Medicare Other

## 2014-12-17 DIAGNOSIS — R1111 Vomiting without nausea: Secondary | ICD-10-CM

## 2014-12-17 LAB — CBC
HEMATOCRIT: 38.5 % — AB (ref 39.0–52.0)
HEMOGLOBIN: 12 g/dL — AB (ref 13.0–17.0)
MCH: 24.3 pg — ABNORMAL LOW (ref 26.0–34.0)
MCHC: 31.2 g/dL (ref 30.0–36.0)
MCV: 77.9 fL — AB (ref 78.0–100.0)
Platelets: 267 10*3/uL (ref 150–400)
RBC: 4.94 MIL/uL (ref 4.22–5.81)
RDW: 17.3 % — ABNORMAL HIGH (ref 11.5–15.5)
WBC: 4.3 10*3/uL (ref 4.0–10.5)

## 2014-12-17 LAB — COMPREHENSIVE METABOLIC PANEL
ALT: 38 U/L (ref 0–53)
AST: 20 U/L (ref 0–37)
Albumin: 3.6 g/dL (ref 3.5–5.2)
Alkaline Phosphatase: 89 U/L (ref 39–117)
Anion gap: 9 (ref 5–15)
BUN: 8 mg/dL (ref 6–23)
CALCIUM: 8.8 mg/dL (ref 8.4–10.5)
CHLORIDE: 101 mmol/L (ref 96–112)
CO2: 24 mmol/L (ref 19–32)
Creatinine, Ser: 0.99 mg/dL (ref 0.50–1.35)
GLUCOSE: 111 mg/dL — AB (ref 70–99)
POTASSIUM: 3.4 mmol/L — AB (ref 3.5–5.1)
Sodium: 134 mmol/L — ABNORMAL LOW (ref 135–145)
TOTAL PROTEIN: 7.6 g/dL (ref 6.0–8.3)
Total Bilirubin: 0.7 mg/dL (ref 0.3–1.2)

## 2014-12-17 MED ORDER — METOCLOPRAMIDE HCL 5 MG/ML IJ SOLN
5.0000 mg | Freq: Four times a day (QID) | INTRAMUSCULAR | Status: DC
  Administered 2014-12-17 – 2014-12-18 (×3): 5 mg via INTRAVENOUS
  Filled 2014-12-17: qty 1
  Filled 2014-12-17 (×3): qty 2

## 2014-12-17 MED ORDER — HEPARIN SODIUM (PORCINE) 5000 UNIT/ML IJ SOLN
5000.0000 [IU] | Freq: Three times a day (TID) | INTRAMUSCULAR | Status: DC
  Administered 2014-12-17 – 2014-12-18 (×3): 5000 [IU] via SUBCUTANEOUS
  Filled 2014-12-17 (×6): qty 1

## 2014-12-17 MED ORDER — SENNOSIDES-DOCUSATE SODIUM 8.6-50 MG PO TABS
1.0000 | ORAL_TABLET | Freq: Two times a day (BID) | ORAL | Status: DC
  Administered 2014-12-17 – 2014-12-18 (×2): 1 via ORAL
  Filled 2014-12-17 (×3): qty 1

## 2014-12-17 MED ORDER — POLYETHYLENE GLYCOL 3350 17 G PO PACK
17.0000 g | PACK | Freq: Two times a day (BID) | ORAL | Status: DC
  Administered 2014-12-17 – 2014-12-18 (×3): 17 g via ORAL
  Filled 2014-12-17 (×4): qty 1

## 2014-12-17 NOTE — Progress Notes (Signed)
Tonight pt was reclined in bed when I arrived, with lights and tv on. He remembered me from our visit last night and said he had a better day today with visits from his wife and mother. Pt said he may be going home tomorrow. He said he had not thought about the surgery today; he just needed a break. Mr. Stansbery wanted prayer tonight and afterwards he was tearful. When asked about the tears he said they were tears of joy b/c he knew everything will be all right no matter what. Pt seems more accepting tonight of his upcoming decision but, by his own admission, was not ready to make a decision. He thanked me for coming by and for the prayer. Ernest Haber Chaplain

## 2014-12-17 NOTE — Progress Notes (Signed)
[facility] PROGRESS NOTE  [name] LDJ:[id number] DOB: [birth year] DOA: 12/12/2014  Brief HPI: 44 year old male with a history of metastatic colon cancer on chemotherapy, presented with nausea, vomiting, abdominal pain. Last chemotherapy was on April 19. CT scan showed possible tumor necrosis. He was admitted for further management. He was seen by general surgery and by oncology. There was no evidence for obstruction. He started improving. Diet is being advanced.  Past medical history:  Past Medical History  Diagnosis Date  . Anemia   . Cancer 10/13 and 2015    colon ca  . History of colon resection     Consultants: Gen. surgery, oncology (Dr. Marin Olp)  Procedures: None  Antibiotics: None  Subjective: Patient feels better. Tolerating soft diet . Reports mild nausea, but denies any vomiting. Had solid bowel movement today, Ambulating.  Objective: Vital Signs  Filed Vitals:   12/16/14 1406 12/16/14 2153 12/17/14 0630 12/17/14 1323  BP: 119/70 108/65 102/61 116/73  Pulse: 67 66 60 72  Temp: 97.9 F (36.6 C) 98 F (36.7 C) 97.7 F (36.5 C) 97.5 F (36.4 C)  TempSrc: Oral Oral Oral Oral  Resp: 18 18 16 16   Height:      Weight:      SpO2: 100% 100% 99% 99%    Intake/Output Summary (Last 24 hours) at 12/17/14 1405 Last data filed at 12/17/14 1322  Gross per 24 hour  Intake   1320 ml  Output    900 ml  Net    420 ml   Filed Weights   12/12/14 1759  Weight: 96.4 kg (212 lb 8.4 oz)    General appearance: alert, cooperative, appears stated age, no distress and Anxious Resp: clear to auscultation bilaterally Cardio: regular rate and rhythm, S1, S2 normal, no murmur, click, rub or gallop GI: Soft. Remains Mildly Tender diffusely without any rebound, rigidity or guarding. No masses or organomegaly. Bowel sounds present. Extremities: extremities normal, atraumatic, no cyanosis or edema Neurologic: Alert and oriented 3. No focal neurological  deficits.  Lab Results:  Basic Metabolic Panel:  Recent Labs Lab 12/13/14 0540 12/14/14 0408 12/15/14 0438 12/16/14 0505 12/17/14 0814  NA 137 134* 136 139 134*  K 3.7 3.3* 4.1 4.0 3.4*  CL 104 99 103 101 101  CO2 27 27 25 25 24   GLUCOSE 101* 104* 105* 111* 111*  BUN 13 10 8 10 8   CREATININE 0.86 0.95 0.83 0.92 0.99  CALCIUM 8.6 8.7 8.7 9.1 8.8   Liver Function Tests:  Recent Labs Lab 12/12/14 0911 12/13/14 0540 12/15/14 0438 12/17/14 0814  AST 56* 30 17 20   ALT 92* 71* 46 38  ALKPHOS 93 76 82 89  BILITOT 0.7 0.6 0.7 0.7  PROT 7.7 6.4 6.5 7.6  ALBUMIN 3.6 2.9* 3.0* 3.6    Recent Labs Lab 12/12/14 0911  LIPASE 22   CBC:  Recent Labs Lab 12/12/14 0911 12/13/14 0540 12/14/14 0408 12/15/14 0438 12/16/14 0505 12/17/14 0814  WBC 5.8 5.2 3.6* 3.2* 3.5* 4.3  NEUTROABS 4.4  --   --   --   --   --   HGB 11.9* 10.6* 11.3* 11.1* 11.0* 12.0*  HCT 37.9* 34.9* 36.2* 35.6* 35.6* 38.5*  MCV 77.5* 78.8 78.5 77.7* 78.4 77.9*  PLT 366 297 264 240 254 267    Recent Results (from the past 240 hour(s))  Clostridium Difficile by PCR     Status: None   Collection Time: 12/12/14 10:12 PM  Result Value Ref Range  Status   C difficile by pcr NEGATIVE NEGATIVE Final      Studies/Results: Dg Abd 2 Views  12-25-14   CLINICAL DATA:  Ileus. Abdominal bloating and intermittent cramping. Peritoneal carcinomatosis.  EXAM: ABDOMEN - 2 VIEW  COMPARISON:  12/16/2014, 12/16/2014 and 12/15/2014 and 12/14/2014  FINDINGS: There are multiple air-fluid levels in nondistended loops of large and small bowel. There is increased air in the bowel since the prior exam.  No free air.  Scarring at the lung bases.  IMPRESSION: Increased air-fluid levels and air in nondistended loops of large and small bowel. Findings are consistent with ileus.   Electronically Signed   By: Lorriane Shire M.D.   On: 2014/12/25 10:04   Dg Abd 2 Views  12/16/2014   CLINICAL DATA:  Follow-up ileus  EXAM: ABDOMEN - 2  VIEW  COMPARISON:  12/15/2014  FINDINGS: Nonobstructive bowel gas pattern.  Mildly prominent but nondilated loops of small bowel in the central abdomen, suggesting small bowel ileus.  Mild residual contrast in the rectum.  No evidence of free air under the diaphragm on the upright view.  Visualized osseous structures are within normal limits.  IMPRESSION: Possible adynamic small bowel ileus.  No evidence of small bowel obstruction or free air.   Electronically Signed   By: Julian Hy M.D.   On: 12/16/2014 10:21    Medications:  Scheduled: . dronabinol  5 mg Oral BID AC  . escitalopram  20 mg Oral Daily  . morphine  30 mg Oral BID  . polyethylene glycol  17 g Oral BID  . vitamin B-6  250 mg Oral Daily   Continuous: . 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 Tolerating soft diet. 8 some breakfast today , No small bowel obstruction was noted but there was evidence of possible tumor necrosis. General surgery was consulted and their input is appreciated. No indication for urgent intervention. Patient previously has declined surgery at [facility]. CT scan was repeated to see if contrast went through and indeed no obstruction was noted. Abdominal x-rays also did not show any obstruction. But showing ileus. Will start on Reglan.  Acute diarrhea Appears to have resolved. C. difficile negative. No further blood in the stool. Keep him hydrated. No blood in stool seen on reported. Hemoglobin is stable.  Metastatic colon cancer on chemotherapy Last  chemotherapy was on April 19. Patient has significant tumor burden in his abdomen. Plan was for the patient to go to [facility] to consider surgery and intraperitoneal chemotherapy. Apparently surgery was scheduled, but the patient canceled it as he was not certain. Oncologist is following. Continue Marinol.  Hypokalemia Repleted  Microcytic anemia Hemoglobin close to baseline. Did drop some due to dilution, but now stable. Continue to monitor. Transfuse as needed, though no indication currently.  History of depression Continue Lexapro.  DVT Prophylaxis: heparin  Code Status: Full code  Family Communication: Discussed with the patient  Disposition Plan: discharge home once cleared by Oncology.   Time spent: 25 minutes  LOS: 5 days  [facility], David Cajuste MD  [facility] Pager [phone] 12/17/2014, 2:05 PM  If 7PM-7AM, please contact night-coverage at [email or web address], password [facility]

## 2014-12-17 NOTE — Progress Notes (Signed)
Pt was lying in bed, w/o lights or tv on and mostly stared into space during most of our visit. He was tearful throughout the visit. He expressed how he was tired of being sick; he has been going through this for 3 years; he said the dr says he needs surgery. He said he was told that if he didn't do something they will have to shove a tube down his throat. Pt spoke of how being told that upset him. He also said he had expressed his concerns to staff. I apologized to pt as well.  He expressed concerns of the long recovery if he has surgery. His greatest concern is that his mind was not there yet and said he knows he needs surgery, but he said my mind has to be with it. Mr. Boedecker shared his history of origin; his mom was on drugs as he grew up (after she recovered from a near fatal gunshot wound he said her life has changed and they have a good relationship); he said he is presently on presently on anti-depressants; and he has a 74 year old son (out of 3) that has a disease that affects him developmentally and he has the mind of a 44 year old. Mr. Keesling also said his wife suffers from bi-polar and he expressed concerns for not taking care of [place] family while here. Pt continued to cry as he said he just wishes people (namely his family and dr) understood that he knows he needs the surgery but just needs time for his mind to get there. I listened to pt's concerns and provided encourage and comforting words where I felt it would be helpful. Pt wanted prayer and said his mom has also been praying for him in the room. After prayer the pt thanked me and for the first time made eye contact. We visit a few more minutes.  Ernest Haber Chaplain   12/16/14 2000  Clinical Encounter Type  Visited With Patient

## 2014-12-17 NOTE — Progress Notes (Signed)
Mr. Chavira had a bad day yesterday. He had breakfast but after that, he really did not eat. He has a lot of nausea. He does not want to eat for fear of throwing up.  He had his abdominal x-ray yesterday. This showed a persistent ileus. I will check an x-ray on him today. If he still has an ileus, then I may want to try some Reglan to see this helps.  He was able to talk to a chaplain yesterday. This made him feel a little bit better.  He's not complaining much in the way of abdominal pain. He does have some discomfort.  He had a bowel movement last night.  He's had no fever. There's been no bleeding. He's had no shortness of breath. He's had no cough. There's been no leg swelling.  His vital signs were all stable. Blood pressure 102/61. Temperature 97.7. Pulse is 60. Head and neck exam shows no ocular or oral lesions. He has no adenopathy in the neck. Lungs are clear. Cardiac exam regular rate and rhythm. Abdomen is soft. Bowel sounds are minimal. He has some slight tenderness over on the right side to palpation. He has no obvious abdominal mass. Extremities shows no clubbing, cyanosis or edema.  I just don't think he is ready to go home today. I do still think he is mentally capable of handling his physical issues at home right now. We will see what the x-ray shows.  It is possible that he just may not be able to eat much. If he has a persistent ileus, I will put him on some Reglan. It is possible that he may have carcinomatosis that could be disrupting his intestinal movement.  I realize this is a very difficult problem for him. I just think that the only way that he is going be helpful is with aggressive surgery. He just mentally is not able to come to that decision himself.  I appreciate everybody's help.   Lum Keas  Psalm 27:14

## 2014-12-17 NOTE — Progress Notes (Signed)
   12/17/14 1200  Clinical Encounter Type  Visited With Patient and family together  Visit Type Initial;Follow-up  Referral From Chaplain;Nurse  Spiritual Encounters  Spiritual Needs Emotional;Grief support;Prayer  Stress Factors  Patient Stress Factors None identified  Family Stress Factors None identified  Advance Directives (For Healthcare)  Does patient have an advance directive? No  Would patient like information on creating an advanced directive? No - patient declined information   A visit to the patient and family was during mealtime. Patient appeared to be somewhat confused and wife says that she was grateful for the visit. I will follow up with this patient.

## 2014-12-18 DIAGNOSIS — F329 Major depressive disorder, single episode, unspecified: Secondary | ICD-10-CM

## 2014-12-18 DIAGNOSIS — F0631 Mood disorder due to known physiological condition with depressive features: Secondary | ICD-10-CM | POA: Diagnosis present

## 2014-12-18 LAB — COMPREHENSIVE METABOLIC PANEL
ALT: 32 U/L (ref 0–53)
AST: 16 U/L (ref 0–37)
Albumin: 3.3 g/dL — ABNORMAL LOW (ref 3.5–5.2)
Alkaline Phosphatase: 79 U/L (ref 39–117)
Anion gap: 9 (ref 5–15)
BILIRUBIN TOTAL: 0.6 mg/dL (ref 0.3–1.2)
BUN: 10 mg/dL (ref 6–23)
CALCIUM: 9 mg/dL (ref 8.4–10.5)
CO2: 25 mmol/L (ref 19–32)
Chloride: 104 mmol/L (ref 96–112)
Creatinine, Ser: 0.9 mg/dL (ref 0.50–1.35)
GFR calc non Af Amer: >90 mL/min (ref >90)
GLUCOSE: 108 mg/dL — AB (ref 70–99)
Potassium: 3.7 mmol/L (ref 3.5–5.1)
SODIUM: 138 mmol/L (ref 135–145)
TOTAL PROTEIN: 6.8 g/dL (ref 6.0–8.3)

## 2014-12-18 LAB — CBC
HCT: 35.7 % — ABNORMAL LOW (ref 39.0–52.0)
Hemoglobin: 10.8 g/dL — ABNORMAL LOW (ref 13.0–17.0)
MCH: 23.4 pg — ABNORMAL LOW (ref 26.0–34.0)
MCHC: 30.3 g/dL (ref 30.0–36.0)
MCV: 77.4 fL — ABNORMAL LOW (ref 78.0–100.0)
Platelets: 235 10*3/uL (ref 150–400)
RBC: 4.61 MIL/uL (ref 4.22–5.81)
RDW: 17.3 % — AB (ref 11.5–15.5)
WBC: 4.1 10*3/uL (ref 4.0–10.5)

## 2014-12-18 MED ORDER — DULOXETINE HCL 30 MG PO CPEP: 30.0000 mg | ORAL_CAPSULE | Freq: Every day | ORAL | Status: AC

## 2014-12-18 MED ORDER — DRONABINOL 5 MG PO CAPS: 5.0000 mg | ORAL_CAPSULE | Freq: Two times a day (BID) | ORAL | Status: AC

## 2014-12-18 MED ORDER — METOCLOPRAMIDE HCL 10 MG PO TABS: 10.0000 mg | ORAL_TABLET | Freq: Three times a day (TID) | ORAL | Status: AC

## 2014-12-18 MED ORDER — CLONAZEPAM 0.5 MG PO TABS: 0.5000 mg | ORAL_TABLET | Freq: Three times a day (TID) | ORAL | Status: AC | PRN

## 2014-12-18 MED ORDER — METOCLOPRAMIDE HCL 10 MG PO TABS
10.0000 mg | ORAL_TABLET | Freq: Three times a day (TID) | ORAL | Status: DC
  Administered 2014-12-18 (×2): 10 mg via ORAL
  Filled 2014-12-18 (×2): qty 1

## 2014-12-18 NOTE — Progress Notes (Signed)
[name] feels good this morning. He wants to go home. He slept well last night.  His abdominal film look like the ileus might be a little bit worse. He seems to be with very few symptoms. He is on Reglan. I think this will be a good idea for him.  He had a bowel movement yesterday.  He is not hurting. He's had no fever. There's been no bleeding.  On his physical exam, all his vital signs are stable. Temperature 98.1. Blood pressure 126/64. Pulse is 81. His abdomen is soft. Bowel sounds still decreased. There is no guarding or rebound tenderness. No obvious abdominal masses noted. Lungs are clear. Cardiac exam regular rate and rhythm.  His labs look okay. Potassium is 3.7.  Again, he wants to go home. I see no problems with him going home. I think this would be good for him mentally.  I need to make sure that he is on Reglan at home. I switched his IV Reglan to oral Reglan.  I am on-call this weekend. I told him that if he has any problems, that he can always give me a call.  I am just glad to see that his frame of mind is better.  I appreciate all the outstanding and compassionate care that he has received up on [address].  Pete E.  1 Corinthians 15:58

## 2014-12-18 NOTE — Consult Note (Signed)
[place] [facility] Consult   Reason for Consult:  Depression and anxiety Referring Physician:  DR. Waldron Labs Patient Identification: David Conrad MRN:  [id number] Principal Diagnosis: Other depression due to general medical condition Diagnosis:   Patient Active Problem List   Diagnosis Date Noted  . Other depression due to general medical condition [F32.9] 12/18/2014  . Metastasis from colon cancer [C79.9, C18.9] 12/12/2014  . Nausea vomiting and diarrhea [R11.2, R19.7] 12/12/2014  . Bowel obstruction [K56.60] 11/04/2014  . Cancer of colon [C18.9] 11/04/2014  . Abdominal pain in male [R10.9]   . Iron deficiency anemia due to chronic blood loss [D50.0]   . SBO (small bowel obstruction) [K56.69]   . Abdominal pain [R10.9] 10/08/2014  . 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 Hypokalemia [E87.6] 06/23/2012  . Incidental lung nodule, less than or equal to 27m, RLL [R91.1] 06/23/2012  . Pharyngitis [J02.9] 06/22/2012  . Colon cancer, ascending, pT3, pN2a (6/18 LN) , pMX  [C18.2] 06/15/2012  . Anemia [D64.9] 06/15/2012  . Dyspnea on exertion [R06.09] 06/15/2012  . Tobacco abuse [Z72.0] 06/15/2012    Total Time spent with patient: 45 minutes  Subjective:   David LEONETTIis a 44y.o. male patient admitted with nausea, vomiting and depression.  HPI:  David BUZBYis a 44y.o. 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 Psychiatric consultation and evaluation requested for medication management of depression. Patient reported he has been suffering with chronic medical condition and depression, sad, unhappy, dysphoric, loss of energy, loss of motivation, isolated, withdrawn, poor appetite and sleep. Patient also reported that he is feeling anxious and hand shaking during this evaluation. Patient also states that he does not feel secure secondary to his condition not getting better. Patient has no outpatient  [facility]iatric services [facility] Lex[facility] 20 mg for more than 3 months ago. Patient reportedly continued to endorse symptoms of depression at this time. Patient is willing to change his medication for better control of the depression and anxiety. Patient lives with his wife and has 31young children and used to work as a cBiomedical scientistabout 3 years ago. Patient stated his extended family members does not care about him. Patient repeatedly and adamantly denies suicidal ideation, homicidal ideation, intention and plans. He has no evidence of psychotic symptoms.  Medical history: Patient has a history of metastatic colon cancer on chemotherapy and he had a surgery in 2013/14. Patient last received his chemotherapy on April 19. His symptoms started on Thursday, about 2 days afterwards. He mentions nausea and vomiting along with abdominal pain and diarrhea. He's had the numerous episodes of emesis with yellowish and greenish fluid. Denies any blood in the emesis. He had a few episodes of loose stool this morning, some of which had small amount of blood. Abdominal pain is mostly in the lower abdomen which is mainly due to his cancer. The pain got worse on Thursday once he started vomiting. The pain is a sharp pain 6 out of 10 in intensity without radiation. He denies any fever. He has had chills. He lost about 14 pounds in the last 2 weeks. Has had decreased activity level. No syncopal episodes. Hasn't had any vomiting since he has been in the emergency department. Abdominal x-ray raised the possibility of a small bowel obstruction  HPI Elements:   Location:  Depression and anxiety. Quality:  Poor. Severity:  Helpless, worthless, isolated and withdrawn and  dysphoric. Timing:  I wants file  better and normal but I am not. Duration:  One month. Context:  Chronic medical condition with disability.  Past Medical History:  Past Medical History  Diagnosis Date  .  Anemia   . Cancer 10/13 and 2015    colon ca  . History of colon resection     Past Surgical History  Procedure Laterality Date  . Colonoscopy  06/17/2012    Procedure: COLONOSCOPY;  Surgeon: Juanita Craver, MD;  Location: [facility] ENDOSCOPY;  Service: Endoscopy;  Laterality: N/A;  . Colon resection  06/19/2012    Procedure: COLON RESECTION LAPAROSCOPIC;  Surgeon: Ralene Ok, MD;  Location: [facility];  Service: General;  Laterality: Right;  . Colostomy revision  06/19/2012    Procedure: COLON RESECTION RIGHT;  Surgeon: Ralene Ok, MD;  Location: [facility];  Service: General;  Laterality: Right;   Family History:  Family History  Problem Relation Age of Onset  . Alcohol abuse Father   . Colon cancer Maternal Grandfather    Social History:  History  Alcohol Use No     History  Drug Use No    History   Social History  . Marital Status: Married    Spouse Name: N/A  . Number of Children: N/A  . Years of Education: N/A   Social History Main Topics  . Smoking status: Former Smoker -- 0.50 packs/day for 15 years    Types: Cigarettes    Start date: 10/01/1997    Quit date: 07/31/2013  . Smokeless tobacco: Never Used     Comment: quit 2014   . Alcohol Use: No  . Drug Use: No  . Sexual Activity: Yes   Other Topics Concern  . None   Social History Narrative   Additional Social History: Lives with his wife and 3 children and not working any longer.                          Allergies:   Allergies  Allergen Reactions  . Morphine Other (See Comments)    Other reaction(s): Dizziness (intolerance)    Labs:  Results for orders placed or performed during the hospital encounter of 12/12/14 (from the past 48 hour(s))  CBC     Status: Abnormal   Collection Time: 12/17/14  8:14 AM  Result Value Ref Range   WBC 4.3 4.0 - 10.5 K/uL   RBC 4.94 4.22 - 5.81 MIL/uL   Hemoglobin 12.0 (L) 13.0 - 17.0 g/dL   HCT 38.5 (L) 39.0 - 52.0 %   MCV 77.9 (L) 78.0 - 100.0 fL   MCH 24.3  (L) 26.0 - 34.0 pg   MCHC 31.2 30.0 - 36.0 g/dL   RDW 17.3 (H) 11.5 - 15.5 %   Platelets 267 150 - 400 K/uL  Comprehensive metabolic panel     Status: Abnormal   Collection Time: 12/17/14  8:14 AM  Result Value Ref Range   Sodium 134 (L) 135 - 145 mmol/L   Potassium 3.4 (L) 3.5 - 5.1 mmol/L   Chloride 101 96 - 112 mmol/L   CO2 24 19 - 32 mmol/L   Glucose, Bld 111 (H) 70 - 99 mg/dL   BUN 8 6 - 23 mg/dL   Creatinine, Ser 0.99 0.50 - 1.35 mg/dL   Calcium 8.8 8.4 - 10.5 mg/dL   Total Protein 7.6 6.0 - 8.3 g/dL   Albumin 3.6 3.5 - 5.2 g/dL   AST 20 0 -  37 U/L   ALT 38 0 - 53 U/L   Alkaline Phosphatase 89 39 - 117 U/L   Total Bilirubin 0.7 0.3 - 1.2 mg/dL   GFR calc non Af Amer >90 >90 mL/min   GFR calc Af Amer >90 >90 mL/min    Comment: (NOTE) The eGFR has been calculated using the CKD EPI equation. This calculation has not been validated in all clinical situations. eGFR's persistently <90 mL/min signify possible Chronic Kidney Disease.    Anion gap 9 5 - 15  CBC     Status: Abnormal   Collection Time: 12/18/14  4:13 AM  Result Value Ref Range   WBC 4.1 4.0 - 10.5 K/uL   RBC 4.61 4.22 - 5.81 MIL/uL   Hemoglobin 10.8 (L) 13.0 - 17.0 g/dL   HCT 35.7 (L) 39.0 - 52.0 %   MCV 77.4 (L) 78.0 - 100.0 fL   MCH 23.4 (L) 26.0 - 34.0 pg   MCHC 30.3 30.0 - 36.0 g/dL   RDW 17.3 (H) 11.5 - 15.5 %   Platelets 235 150 - 400 K/uL  Comprehensive metabolic panel     Status: Abnormal   Collection Time: 12/18/14  4:13 AM  Result Value Ref Range   Sodium 138 135 - 145 mmol/L   Potassium 3.7 3.5 - 5.1 mmol/L   Chloride 104 96 - 112 mmol/L   CO2 25 19 - 32 mmol/L   Glucose, Bld 108 (H) 70 - 99 mg/dL   BUN 10 6 - 23 mg/dL   Creatinine, Ser 0.90 0.50 - 1.35 mg/dL   Calcium 9.0 8.4 - 10.5 mg/dL   Total Protein 6.8 6.0 - 8.3 g/dL   Albumin 3.3 (L) 3.5 - 5.2 g/dL   AST 16 0 - 37 U/L   ALT 32 0 - 53 U/L   Alkaline Phosphatase 79 39 - 117 U/L   Total Bilirubin 0.6 0.3 - 1.2 mg/dL   GFR calc non  Af Amer >90 >90 mL/min   GFR calc Af Amer >90 >90 mL/min    Comment: (NOTE) The eGFR has been calculated using the CKD EPI equation. This calculation has not been validated in all clinical situations. eGFR's persistently <90 mL/min signify possible Chronic Kidney Disease.    Anion gap 9 5 - 15    Vitals: Blood pressure 126/64, pulse 81, temperature 98.1 F (36.7 C), temperature source Oral, resp. rate 16, height 5' 7"  (1.702 m), weight 96.4 kg (212 lb 8.4 oz), SpO2 100 %.  Risk to Self: Is patient at risk for suicide?: No Risk to Others:   Prior Inpatient Therapy:   Prior Outpatient Therapy:    Current Facility-Administered Medications  Medication Dose Route Frequency Provider Last Rate Last Dose  . 0.9 % NaCl with KCl 20 mEq/ L  infusion   Intravenous Continuous Bonnielee Haff, MD 30 mL/hr at 12/18/14 0600    . acetaminophen (TYLENOL) tablet 650 mg  650 mg Oral Q6H PRN Bonnielee Haff, MD       Or  . acetaminophen (TYLENOL) suppository 650 mg  650 mg Rectal Q6H PRN Bonnielee Haff, MD      . dronabinol (MARINOL) capsule 5 mg  5 mg Oral BID AC Volanda Napoleon, MD   5 mg at 12/15/14 1708  . escitalopram (LEXAPRO) tablet 20 mg  20 mg Oral Daily Bonnielee Haff, MD   20 mg at 12/17/14 1024  . heparin injection 5,000 Units  5,000 Units Subcutaneous 3 times per day Albertine Patricia,  MD   5,000 Units at 12/18/14 0538  . HYDROmorphone (DILAUDID) injection 1 mg  1 mg Intravenous Q3H PRN Bonnielee Haff, MD   1 mg at 12/15/14 2020  . LORazepam (ATIVAN) injection 0.5 mg  0.5 mg Intravenous Q6H PRN Bonnielee Haff, MD   0.5 mg at 12/13/14 2038  . metoCLOPramide (REGLAN) tablet 10 mg  10 mg Oral TID AC & HS Volanda Napoleon, MD   10 mg at 12/18/14 0900  . morphine (MS CONTIN) 12 hr tablet 30 mg  30 mg Oral BID Bonnielee Haff, MD   30 mg at 12/17/14 2137  . ondansetron (ZOFRAN) tablet 4 mg  4 mg Oral Q6H PRN Bonnielee Haff, MD       Or  . ondansetron (ZOFRAN) injection 4 mg  4 mg Intravenous Q6H PRN  Bonnielee Haff, MD   4 mg at 12/16/14 1650  . polyethylene glycol (MIRALAX / GLYCOLAX) packet 17 g  17 g Oral BID Albertine Patricia, MD   17 g at 12/17/14 2137  . promethazine (PHENERGAN) injection 12.5 mg  12.5 mg Intravenous Q6H PRN Bonnielee Haff, MD   12.5 mg at 12/15/14 2020  . pyridOXINE (VITAMIN B-6) tablet 250 mg  250 mg Oral Daily Bonnielee Haff, MD   250 mg at 12/17/14 1023  . senna-docusate (Senokot-S) tablet 1 tablet  1 tablet Oral BID Albertine Patricia, MD   1 tablet at 12/17/14 1507   Facility-Administered Medications Ordered in Other Encounters  Medication Dose Route Frequency Provider Last Rate Last Dose  . dextrose 5 % solution   Intravenous Once Volanda Napoleon, MD      . sodium chloride 0.9 % injection 10 mL  10 mL Intracatheter PRN Volanda Napoleon, MD   10 mL at 11/02/14 1430    Musculoskeletal: Strength & Muscle Tone: decreased Gait & Station: unable to stand Patient leans: N/A  Psychiatric Specialty Exam: Physical Exam as per history and physical   ROS  History obtained from the patient General: positive for - fatigue Psychological: positive for - anxiety, depression and dysphoria Ophthalmic: negative ENT: negative Allergy and Immunology: negative Hematological and Lymphatic: negative Endocrine : negative Respiratory: no cough, shortness of breath, or wheezing Cardiovascular : no chest pain or dyspnea on exertion Gastrointestinal: Nausea, vomiting's and abdominal pain medication diarrhea and constipation Genito-Urinary: no dysuria, trouble voiding, or hematuria Musculoskeletal: negative Neurological: no TIA or stroke symptoms Dermatological: negative  Blood pressure 126/64, pulse 81, temperature 98.1 F (36.7 C), temperature source Oral, resp. rate 16, height 5' 7"  (1.702 m), weight 96.4 kg (212 lb 8.4 oz), SpO2 100 %.Body mass index is 33.28 kg/(m^2).  General Appearance: Guarded  Eye Contact::  Good  Speech:  Clear and Coherent and Slow  Volume:   Decreased  Mood:  Dysphoric  Affect:  Depressed and Tearful  Thought Process:  Coherent, Goal Directed and Intact  Orientation:  Full (Time, Place, and Person)  Thought Content:  Rumination  Suicidal Thoughts:  No  Homicidal Thoughts:  No  Memory:  Immediate;   Fair Recent;   Fair  Judgement:  Fair  Insight:  Fair  Psychomotor Activity:  Decreased  Concentration:  Fair  Recall:  Good  Fund of Knowledge:Good  Language: Good  Akathisia:  Negative  Handed:  Right  AIMS (if indicated):     Assets:  Communication Skills Desire for Improvement Financial Resources/Insurance Housing Intimacy Leisure Time Resilience Social Support  ADL's:  Impaired  Cognition: WNL  Sleep:  Medical Decision Making: Review of Psycho-Social Stressors (1), Review or order clinical lab tests (1), Established Problem, Worsening (2), Review of Medication Regimen & Side Effects (2) and Review of New Medication or Change in Dosage (2)  Treatment Plan Summary: Daily contact with patient to assess and evaluate symptoms and progress in treatment and Medication management  Plan:  Patient has been suffering with significant symptoms of depression and anxiety secondary to metastatic colon cancer and status post chemotherapy and history of colon surgery. Patient was not responding to his current antidepressant medication Lexapro which was started more than 3 months ago. Patient is willing to give a trial of new medication. Case discussed with Dr. Waldron Labs and provided guidelines for treating depression secondary to general medical condition. Discussed possibility of outpatient hospice care if eligible.  Depression: Provide Cymbalta 30 mg daily for better control of her depression and discontinue Lexapro which is not helpful  Anxiety: We provide clonazepam 0.5 mg 3 times a day to control symptoms of anxiety  Insomnia: Patient will continue his current sleeping medication which is helpful to him  Patient does  not meet criteria for psychiatric inpatient admission. Supportive therapy provided about ongoing stressors.   Appreciate psychiatric consultation and follow up as clinically required Please contact [phone] or [phone] if needs further assistance  Disposition: Patient will be referred to the outpatient psychiatric services and medically stable. Patient does not meet criteria for acute psychiatric hospitalization and has no safety concerns.   Noemi Ishmael,JANARDHAHA R. 12/18/2014 10:03 AM

## 2014-12-18 NOTE — Progress Notes (Signed)
Nurse reviewed discharge instructions with pt.  Pt verbalized understanding of discharge instructions, follow up appointments and new medications.  No concerns at time of discharge.  Prescriptions given at time of discharge.

## 2014-12-18 NOTE — Discharge Summary (Signed)
David Conrad, 44 y.o., DOB [birth year], MRN [id number]. Admission date: 12/12/2014 Discharge Date 12/18/2014 Primary MD No PCP Per Patient Admitting Physician Bonnielee Haff, MD   Oncology please follow-up on: - Check CBC, BMP during next visit.  Admission Diagnosis  Colon cancer [C18.9] Partial bowel obstruction [K56.69] Nausea and vomiting [R11.2] Abdominal pain [R10.9] Metastasis from colon cancer [C79.9, C18.9] Nausea and vomiting, vomiting of unspecified type [R11.2]  Discharge Diagnosis   Principal Problem:   Other depression due to general medical condition Active Problems:   Colon cancer, ascending, pT3, pN2a (6/18 LN) , pMX    Hypokalemia   Abdominal pain   Bowel obstruction   Metastasis from colon cancer   Nausea vomiting and diarrhea    Past Medical History  Diagnosis Date  . Anemia   . Cancer 10/13 and 2015    colon ca  . History of colon resection     Past Surgical History  Procedure Laterality Date  . Colonoscopy  06/17/2012    Procedure: COLONOSCOPY;  Surgeon: Juanita Craver, MD;  Location: [facility] ENDOSCOPY;  Service: Endoscopy;  Laterality: N/A;  . Colon resection  06/19/2012    Procedure: COLON RESECTION LAPAROSCOPIC;  Surgeon: Ralene Ok, MD;  Location: [facility];  Service: General;  Laterality: Right;  . Colostomy revision  06/19/2012    Procedure: COLON RESECTION RIGHT;  Surgeon: Ralene Ok, MD;  Location: [facility];  Service: General;  Laterality: Right;     Hospital Course See H&P, Labs, Consult and Test reports for all details in brief, patient was admitted for **  Principal Problem:   Other depression due to general medical condition Active Problems:   Colon cancer, ascending, pT3, pN2a (6/18 LN) , pMX    Hypokalemia   Abdominal pain   Bowel obstruction   Metastasis from colon cancer   Nausea vomiting and diarrhea  44 year old male with a history of metastatic colon cancer on chemotherapy, presented with nausea, vomiting, abdominal pain.  Last chemotherapy was on April 19. CT scan showed possible tumor necrosis. He was admitted for further management. He was seen by general surgery and by oncology. There was no evidence for obstruction. He started improving. Diet is being advanced, has good bowel movement, but remains to be having poor appetite , agent has depression, so psychiatric was consulted as well , his x-ray was significant for ileus, no obstruction , patient was tolerating oral intake and with bowel movement for the last 48 hours .  Nausea, vomiting and abdominal pain He seems to be improving. Tolerating soft diet. With good bowel movement for the last 2 days,, No small bowel obstruction was noted but there was evidence of possible tumor necrosis. General surgery was consulted and their input is appreciated. No indication for urgent intervention.CT scan was repeated to see if contrast went through and indeed no obstruction was noted. Abdominal x-rays also did not show any obstruction. But showing ileus, but patient clinically able to tolerate oral intake, with good bowel movement, will be discharged on Reglan .   Diarrhea - resolved. C. difficile negative. No further blood in the stool.Hemoglobin is stable.  Metastatic colon cancer on chemotherapy Last chemotherapy was on April 19. Patient has significant tumor burden in his abdomen. Plan was for the patient to go to [facility] to consider surgery and intraperitoneal chemotherapy.Management as per oncology team.  Hypokalemia Repleted  Microcytic anemia Hemoglobin close to baseline. Did drop some due to dilution, but now stable. Continue to monitor.   History  of depression. patient appears to be significantly depressed during hospital stay,  psychiatric were consulted , stopped Lexapro as no improvement on Lexapro , noted on Cymbalta and Klonopin .   Consults   Gen. surgery  Oncology  Psychiatrically  Significant Tests:  See full reports for all details     Ct Abdomen Pelvis Wo Contrast  12/13/2014   CLINICAL DATA:  Current history of metastatic colon cancer, admitted earlier same date with nausea, vomiting and diarrhea which began after chemotherapy 2 days ago. CT abdomen and pelvis earlier same date showed increase in metastatic burden. Repeat CT requested to evaluate progression of contrast through the GI tract to exclude obstruction.  EXAM: CT ABDOMEN AND PELVIS WITHOUT CONTRAST 2316 hr  TECHNIQUE: Multidetector CT imaging of the abdomen and pelvis was performed following the standard protocol without IV contrast.  COMPARISON:  Enhanced CT abdomen and pelvis performed earlier same date 1252 hr, 10/07/2014 and earlier.  FINDINGS: The ingested oral contrast has progressed throughout the small bowel and is present throughout the colon from the ileocolic anastomosis to the rectum. There is no evidence of small bowel or colonic obstruction. Scattered diverticula are present involving the distal descending colon and the sigmoid colon without evidence of acute diverticulitis.  The contrast filled urinary bladder is unremarkable.  IMPRESSION: 1. No evidence of small bowel or colonic obstruction. The oral contrast admitted for the CT approximately 10-1/2 hr earlier has progressed throughout the small bowel and is present in the colon. 2. Please see the report of the enhanced CT abdomen and pelvis performed earlier same date for details of abdominal metastatic disease.   Electronically Signed   By: Evangeline Dakin M.D.   On: 12/13/2014 07:34   Ct Abdomen Pelvis W Contrast  12/12/2014   CLINICAL DATA:  Colon cancer. Nausea and vomiting. Vomiting and diarrhea with onset of symptoms on Thursday. Chemotherapy on Thursday. Intractable vomiting.  EXAM: CT ABDOMEN AND PELVIS WITH CONTRAST  TECHNIQUE: Multidetector CT imaging of the abdomen and pelvis was performed using the standard protocol following bolus administration of intravenous contrast.  CONTRAST:  63mL OMNIPAQUE  IOHEXOL 300 MG/ML SOLN, 166mL OMNIPAQUE IOHEXOL 300 MG/ML SOLN  COMPARISON:  10/07/2014.  FINDINGS: Musculoskeletal: RIGHT sacroiliac joint ankylosis. No aggressive osseous lesion. Benign bone island in the RIGHT ischium.  Lung Bases: Mild dependent atelectasis and/or scarring. Little interval change. No airspace consolidation in this patient undergoing chemotherapy.  Liver: Subcapsular RIGHT hepatic lobe metastases with probable small mucinous metastasis in the lateral RIGHT hepatic lobe. Tiny low-density lesions in the RIGHT hepatic dome are nonspecific. Overall, the appearance of the liver is unchanged compared 04/09/2014. No new metastatic disease.  Spleen:  Normal.  Gallbladder:  Normal.  Common bile duct:  Normal.  Pancreas:  Normal.  Adrenal glands:  Normal bilaterally.  Kidneys: Normal enhancement. Delayed excretion of contrast from the kidneys is normal. Both ureters are within normal limits.  Stomach: Proximal stomach is collapsed. No mural thickening or mural mass lesion.  Small bowel: Duodenum appears normal. Mild distension of small bowel at the duodenal jejunal junction without obstruction. Distal small bowel appears collapsed and there is no obstruction leading to the ileocolonic anastomosis.  Colon: RIGHT hemicolectomy. Anastomotic staple line is present in the central abdomen (image 37 series 2). The distal colon is mostly collapsed. No mass lesion.  Pelvic Genitourinary:  Urinary bladder appears normal.  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 Compared to the prior  exam, these now demonstrates small locules of gas, which may represent either fistulization with small bowel or necrosis. Abscess is difficult to completely exclude based on the presence of gas which was not on the prior CT 10/07/2014.  Unchanged rectovesical implant. Small implants in the LEFT pericolic gutter near the splenic flexure appears slightly  larger than on the prior exam of 10/07/2014. In aggregate, these 2 nodules measures 17 mm x 12 mm.  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  Contrast is present in the small bowel but has not yet reached the residual colon. This makes assessment of fistula difficult. A reasonable option would be to return the patient for CT scanning in 12 hours to see if any oral contrast is present in the metastatic lesion that would indicate fistulization. Given the advanced disease, this decision should be made based on whether it will affect clinical management.  Vasculature: Pulmonary vessels at the lung bases appear within normal limits. No acute vascular abnormality.  Body Wall: Metastatic implants are present along the surgical incision in the midline. The largest of these has increased from February 2016, measuring 20 mm x 35 mm (image 41 series 2). Unchanged fat containing RIGHT inguinal hernia.  IMPRESSION: 1. Negative for bowel obstruction. Minimal increase in mucinous colon cancer metastatic burden in the peritoneum, largest in the RIGHT hemicolectomy bed. 2. In the largest peritoneal lesion in the RIGHT abdomen, locules of gas have developed within the tumor. The differential considerations are fistulization with small bowel or tumor necrosis with pockets of gas. Although abscess is a possibility, the relative lack of change in the configuration compared to the prior exam from February makes an abscess less likely. 3. Stable liver metastases.   Electronically Signed   By: Dereck Ligas M.D.   On: 12/12/2014 13:45   Dg Abd 2 Views  12/17/2014   CLINICAL DATA:  Ileus. Abdominal bloating and intermittent cramping. Peritoneal carcinomatosis.  EXAM: ABDOMEN - 2 VIEW  COMPARISON:  12/16/2014, 12/16/2014 and 12/15/2014 and 12/14/2014  FINDINGS: There are multiple air-fluid levels in  nondistended loops of large and small bowel. There is increased air in the bowel since the prior exam.  No free air.  Scarring at the lung bases.  IMPRESSION: Increased air-fluid levels and air in nondistended loops of large and small bowel. Findings are consistent with ileus.   Electronically Signed   By: Lorriane Shire M.D.   On: 12/17/2014 10:04   Dg Abd 2 Views  12/16/2014   CLINICAL DATA:  Follow-up ileus  EXAM: ABDOMEN - 2 VIEW  COMPARISON:  12/15/2014  FINDINGS: Nonobstructive bowel gas pattern.  Mildly prominent but nondilated loops of small bowel in the central abdomen, suggesting small bowel ileus.  Mild residual contrast in the rectum.  No evidence of free air under the diaphragm on the upright view.  Visualized osseous structures are within normal limits.  IMPRESSION: Possible adynamic small bowel ileus.  No evidence of small bowel obstruction or free air.   Electronically Signed   By: Julian Hy M.D.   On: 12/16/2014 10:21   Dg Abd 2 Views  12/15/2014   CLINICAL DATA:  Vomiting of, abdominal pain, history of colon cancer, history of previous bowel obstruction  EXAM: ABDOMEN - 2 VIEW  COMPARISON:  Abdominal series of December 14, 2014  FINDINGS: Supine and upright abdominal films reveal small air-fluid  levels within the colon. There is a small amount of distended small bowel projecting over the lower lumbar spine in the midline. There is a small amount of contrast remaining in the left colon and rectum. There is no free extraluminal gas demonstrated.  IMPRESSION: Findings consistent with small and large bowel ileus. There is no evidence of perforation. There has been further distal migration of colonic contrast.   Electronically Signed   By: David  Martinique M.D.   On: 12/15/2014 13:22   Dg Abd 2 Views  12/14/2014   CLINICAL DATA:  History of colon cancer. Status post right hemicolectomy. Nausea and vomiting. Evaluate for bowel obstruction.  EXAM: ABDOMEN - 2 VIEW  COMPARISON:  None.  FINDINGS:  The enteric contrast material from 4/ 23/16 has progressed to the descending colon and rectum. Gaseous distension of the sigmoid colon is noted. No free air. No dilated loops of small bowel noted.  IMPRESSION: 1. Continued antegrade progression of enteric contrast material up to the level of the rectum without evidence for bowel obstruction.   Electronically Signed   By: Kerby Moors M.D.   On: 12/14/2014 08:40   Dg Abd Acute W/chest  12/12/2014   CLINICAL DATA:  Abdominal pain, nausea, vomiting, fever for 3 days. Colon cancer.  EXAM: DG ABDOMEN ACUTE W/ 1V CHEST  COMPARISON:  11/02/2014  FINDINGS: Very low lung volumes with bibasilar atelectasis. Stable right jugular Port-A-Cath. Prominent cardiac silhouette likely due to low volumes. No pneumothorax.  No free intraperitoneal gas. Nonspecific right upper quadrant air-fluid levels. There is 1 distended small bowel loop in the left upper quadrant. Postoperative changes with bowel staples are noted  IMPRESSION: There is 1 dilated small bowel loop associated with air-fluid levels. Developing partial small bowel obstruction is not excluded.  No free intraperitoneal gas  Bibasilar atelectasis.   Electronically Signed   By: Marybelle Killings M.D.   On: 12/12/2014 10:15     Today   Subjective:   Marven Veley today has no headache,no chest pain,no new weakness tingling or numbness, feels  Better today, had bowel movement this a.m., was able to eat good amount of his breakfast.  Objective:   Blood pressure 126/64, pulse 81, temperature 98.1 F (36.7 C), temperature source Oral, resp. rate 16, height 5\' 7"  (1.702 m), weight 96.4 kg (212 lb 8.4 oz), SpO2 100 %.  Intake/Output Summary (Last 24 hours) at 12/18/14 1314 Last data filed at 12/18/14 1215  Gross per 24 hour  Intake 1987.5 ml  Output   1001 ml  Net  986.5 ml    Exam Awake Alert, Oriented *3, No new F.N deficits, [place].AT,PERRAL Supple Neck,No JVD, No cervical lymphadenopathy appriciated.   Symmetrical Chest wall movement, Good air movement bilaterally, CTAB RRR,No Gallops,Rubs or new Murmurs, No Parasternal Heave +ve B.Sounds, Abd Soft, Non tender, No organomegaly appriciated, No rebound -guarding or rigidity. No Cyanosis, Clubbing or edema, No new Rash or bruise  Data Review   Cultures -   CBC w Diff: Lab Results  Component Value Date   WBC 4.1 12/18/2014   WBC 6.1 12/08/2014   HGB 10.8* 12/18/2014   HGB 11.9* 12/08/2014   HCT 35.7* 12/18/2014   HCT 38.0* 12/08/2014   PLT 235 12/18/2014   PLT 371 12/08/2014   LYMPHOPCT 22 12/12/2014   LYMPHOPCT 34.0 12/08/2014   MONOPCT 1* 12/12/2014   MONOPCT 14.9* 12/08/2014   EOSPCT 0 12/12/2014   EOSPCT 2.9 12/08/2014   BASOPCT 0 12/12/2014   BASOPCT 0.2  12/08/2014   CMP: Lab Results  Component Value Date   NA 138 12/18/2014   NA 141 12/08/2014   K 3.7 12/18/2014   K 3.4 12/08/2014   CL 104 12/18/2014   CL 100 12/08/2014   CO2 25 12/18/2014   CO2 30 12/08/2014   BUN 10 12/18/2014   BUN 10 12/08/2014   CREATININE 0.90 12/18/2014   CREATININE 1.0 12/08/2014   PROT 6.8 12/18/2014   PROT 8.5* 12/08/2014   ALBUMIN 3.3* 12/18/2014   BILITOT 0.6 12/18/2014   BILITOT 0.90 12/08/2014   ALKPHOS 79 12/18/2014   ALKPHOS 85* 12/08/2014   AST 16 12/18/2014   AST 18 12/08/2014   ALT 32 12/18/2014   ALT 15 12/08/2014  .  Micro Results Recent Results (from the past 240 hour(s))  Clostridium Difficile by PCR     Status: None   Collection Time: 12/12/14 10:12 PM  Result Value Ref Range Status   C difficile by pcr NEGATIVE NEGATIVE Final     Discharge Instructions      Follow-up Information    Call Volanda Napoleon, MD.   Specialty:  Oncology   Why:  Posthospitalization follow-up   Contact information:   [place], [place] [id number] [phone]       Discharge Medications     Medication List    STOP taking these medications        ALPRAZolam 1 MG tablet  Commonly known as:   XANAX     escitalopram 20 MG tablet  Commonly known as:  LEXAPRO     prochlorperazine 10 MG tablet  Commonly known as:  COMPAZINE      TAKE these medications        clonazePAM 0.5 MG tablet  Commonly known as:  KLONOPIN  Take 1 tablet (0.5 mg total) by mouth 3 (three) times daily as needed for anxiety.     dexamethasone 4 MG tablet  Commonly known as:  DECADRON  Take 4 mg by mouth 2 (two) times daily with a meal. Start on the day after chemo for 3 days     dronabinol 5 MG capsule  Commonly known as:  MARINOL  Take 1 capsule (5 mg total) by mouth 2 (two) times daily before lunch and supper.     DULoxetine 30 MG capsule  Commonly known as:  CYMBALTA  Take 1 capsule (30 mg total) by mouth daily.     lactulose 10 GM/15ML solution  Commonly known as:  CHRONULAC  Take 30 mLs (20 g total) by mouth daily as needed for mild constipation.     lidocaine-prilocaine cream  Commonly known as:  EMLA  Apply topically as needed.     loperamide 2 MG tablet  Commonly known as:  IMODIUM A-D  Take 2 at onset of diarrhea, then 1 every 2hrs until 12hr without a BM. May take 2 tab every 4hrs at bedtime. If diarrhea recurs repeat.     LORazepam 0.5 MG tablet  Commonly known as:  ATIVAN  Take 0.5 mg by mouth at bedtime.     metoCLOPramide 10 MG tablet  Commonly known as:  REGLAN  Take 1 tablet (10 mg total) by mouth 4 (four) times daily -  before meals and at bedtime.     morphine 30 MG 12 hr tablet  Commonly known as:  MS CONTIN  Take 30 mg by mouth 2 (two) times daily.     oxyCODONE 5 MG immediate release tablet  Commonly known as:  Oxy IR/ROXICODONE  Take 5 mg by mouth every 4 (four) hours as needed. for pain     polyethylene glycol packet  Commonly known as:  MIRALAX / GLYCOLAX  Take 17 g by mouth daily.     PRESCRIPTION MEDICATION  Chemo - [facility]     vitamin B-6 250 MG tablet  Take 1 tablet (250 mg total) by mouth daily.     ZOFRAN 8 MG tablet  Generic drug:  ondansetron   Take by mouth every 8 (eight) hours as needed for nausea or vomiting.         Total Time in preparing paper work, data evaluation and todays exam - 35 minutes  ELGERGAWY, DAWOOD M.D on 12/18/2014 at 1:14 PM  [place]  [phone]

## 2014-12-18 NOTE — Discharge Instructions (Signed)
Follow with Dr. Marin Olp in 7 days   Get CBC, CMP, 2 view Chest X ray checked  by Primary MD next visit.    Activity: As tolerated with Full fall precautions use walker/cane & assistance as needed   Disposition Home    Diet: Heart Healthy, soft, with feeding assistance and aspiration precautions.  For Heart failure patients - Check your Weight same time everyday, if you gain over 2 pounds, or you develop in leg swelling, experience more shortness of breath or chest pain, call your Primary MD immediately. Follow Cardiac Low Salt Diet and 1.5 lit/day fluid restriction.   On your next visit with your primary care physician please Get Medicines reviewed and adjusted.   Please request your Prim.MD to go over all Hospital Tests and Procedure/Radiological results at the follow up, please get all Hospital records sent to your Prim MD by signing hospital release before you go home.   If you experience worsening of your admission symptoms, develop shortness of breath, life threatening emergency, suicidal or homicidal thoughts you must seek medical attention immediately by calling 911 or calling your MD immediately  if symptoms less severe.  You Must read complete instructions/literature along with all the possible adverse reactions/side effects for all the Medicines you take and that have been prescribed to you. Take any new Medicines after you have completely understood and accpet all the possible adverse reactions/side effects.   Do not drive, operating heavy machinery, perform activities at heights, swimming or participation in water activities or provide baby sitting services if your were admitted for syncope or siezures until you have seen by Primary MD or a Neurologist and advised to do so again.  Do not drive when taking Pain medications.    Do not take more than prescribed Pain, Sleep and Anxiety Medications  Special Instructions: If you have smoked or chewed Tobacco  in the last 2 yrs  please stop smoking, stop any regular Alcohol  and or any Recreational drug use.  Wear Seat belts while driving.   Please note  You were cared for by a hospitalist during your hospital stay. If you have any questions about your discharge medications or the care you received while you were in the hospital after you are discharged, you can call the unit and asked to speak with the hospitalist on call if the hospitalist that took care of you is not available. Once you are discharged, your primary care physician will handle any further medical issues. Please note that NO REFILLS for any discharge medications will be authorized once you are discharged, as it is imperative that you return to your primary care physician (or establish a relationship with a primary care physician if you do not have one) for your aftercare needs so that they can reassess your need for medications and monitor your lab values.

## 2014-12-21 ENCOUNTER — Emergency Department (HOSPITAL_COMMUNITY): Payer: Medicare Other

## 2014-12-21 ENCOUNTER — Encounter (HOSPITAL_COMMUNITY): Payer: Self-pay | Admitting: Emergency Medicine

## 2014-12-21 ENCOUNTER — Inpatient Hospital Stay (HOSPITAL_COMMUNITY)
Admission: EM | Admit: 2014-12-21 | Discharge: 2014-12-24 | DRG: 389 | Disposition: A | Discharge status: Home / Self Care | Payer: Medicare Other | Attending: Family Medicine | Admitting: Family Medicine

## 2014-12-21 DIAGNOSIS — C786 Secondary malignant neoplasm of retroperitoneum and peritoneum: Secondary | ICD-10-CM | POA: Diagnosis not present

## 2014-12-21 DIAGNOSIS — K56600 Partial intestinal obstruction, unspecified as to cause: Secondary | ICD-10-CM | POA: Diagnosis present

## 2014-12-21 DIAGNOSIS — D509 Iron deficiency anemia, unspecified: Secondary | ICD-10-CM | POA: Diagnosis present

## 2014-12-21 DIAGNOSIS — R109 Unspecified abdominal pain: Secondary | ICD-10-CM | POA: Diagnosis present

## 2014-12-21 DIAGNOSIS — K5669 Other intestinal obstruction: Secondary | ICD-10-CM

## 2014-12-21 DIAGNOSIS — E860 Dehydration: Secondary | ICD-10-CM | POA: Diagnosis present

## 2014-12-21 DIAGNOSIS — K56609 Unspecified intestinal obstruction, unspecified as to partial versus complete obstruction: Secondary | ICD-10-CM

## 2014-12-21 DIAGNOSIS — K567 Ileus, unspecified: Secondary | ICD-10-CM | POA: Diagnosis present

## 2014-12-21 DIAGNOSIS — D649 Anemia, unspecified: Secondary | ICD-10-CM | POA: Diagnosis not present

## 2014-12-21 DIAGNOSIS — C772 Secondary and unspecified malignant neoplasm of intra-abdominal lymph nodes: Secondary | ICD-10-CM

## 2014-12-21 DIAGNOSIS — C189 Malignant neoplasm of colon, unspecified: Secondary | ICD-10-CM | POA: Diagnosis present

## 2014-12-21 DIAGNOSIS — E876 Hypokalemia: Secondary | ICD-10-CM | POA: Diagnosis present

## 2014-12-21 HISTORY — DX: Malignant neoplasm of colon, unspecified: C18.9

## 2014-12-21 LAB — CBC WITH DIFFERENTIAL/PLATELET
Basophils Absolute: 0 10*3/uL (ref 0.0–0.1)
Basophils Relative: 0 % (ref 0–1)
Eosinophils Absolute: 0.2 10*3/uL (ref 0.0–0.7)
Eosinophils Relative: 4 % (ref 0–5)
HCT: 33.9 % — ABNORMAL LOW (ref 39.0–52.0)
Hemoglobin: 10.6 g/dL — ABNORMAL LOW (ref 13.0–17.0)
Lymphocytes Relative: 27 % (ref 12–46)
Lymphs Abs: 1.4 10*3/uL (ref 0.7–4.0)
MCH: 24.3 pg — ABNORMAL LOW (ref 26.0–34.0)
MCHC: 31.3 g/dL (ref 30.0–36.0)
MCV: 77.6 fL — AB (ref 78.0–100.0)
MONOS PCT: 10 % (ref 3–12)
Monocytes Absolute: 0.5 10*3/uL (ref 0.1–1.0)
NEUTROS ABS: 3.1 10*3/uL (ref 1.7–7.7)
Neutrophils Relative %: 59 % (ref 43–77)
Platelets: 268 10*3/uL (ref 150–400)
RBC: 4.37 MIL/uL (ref 4.22–5.81)
RDW: 17.3 % — ABNORMAL HIGH (ref 11.5–15.5)
WBC: 5.3 10*3/uL (ref 4.0–10.5)

## 2014-12-21 LAB — COMPREHENSIVE METABOLIC PANEL
ALBUMIN: 3.3 g/dL — AB (ref 3.5–5.0)
ALK PHOS: 76 U/L (ref 38–126)
ALT: 22 U/L (ref 17–63)
ANION GAP: 6 (ref 5–15)
AST: 13 U/L — AB (ref 15–41)
BILIRUBIN TOTAL: 0.2 mg/dL — AB (ref 0.3–1.2)
BUN: 9 mg/dL (ref 6–20)
CO2: 22 mmol/L (ref 22–32)
CREATININE: 0.79 mg/dL (ref 0.61–1.24)
Calcium: 8.4 mg/dL — ABNORMAL LOW (ref 8.9–10.3)
Chloride: 108 mmol/L (ref 101–111)
GFR calc Af Amer: >60 mL/min (ref >60)
GFR calc non Af Amer: >60 mL/min (ref >60)
Glucose, Bld: 140 mg/dL — ABNORMAL HIGH (ref 70–99)
Potassium: 3.2 mmol/L — ABNORMAL LOW (ref 3.5–5.1)
Sodium: 136 mmol/L (ref 135–145)
Total Protein: 6.5 g/dL (ref 6.5–8.1)

## 2014-12-21 LAB — URINALYSIS, ROUTINE W REFLEX MICROSCOPIC
BILIRUBIN URINE: NEGATIVE
Glucose, UA: NEGATIVE mg/dL
HGB URINE DIPSTICK: NEGATIVE
Ketones, ur: NEGATIVE mg/dL
LEUKOCYTES UA: NEGATIVE
Nitrite: NEGATIVE
Protein, ur: NEGATIVE mg/dL
SPECIFIC GRAVITY, URINE: 1.025 (ref 1.005–1.030)
UROBILINOGEN UA: 0.2 mg/dL (ref 0.0–1.0)
pH: 5.5 (ref 5.0–8.0)

## 2014-12-21 LAB — LIPASE, BLOOD: LIPASE: 29 U/L (ref 22–51)

## 2014-12-21 LAB — MAGNESIUM: MAGNESIUM: 1.7 mg/dL (ref 1.7–2.4)

## 2014-12-21 MED ORDER — METOCLOPRAMIDE HCL 5 MG/ML IJ SOLN
10.0000 mg | Freq: Three times a day (TID) | INTRAMUSCULAR | Status: DC
  Administered 2014-12-21 – 2014-12-22 (×3): 10 mg via INTRAVENOUS
  Filled 2014-12-21 (×4): qty 2

## 2014-12-21 MED ORDER — SODIUM CHLORIDE 0.9 % IJ SOLN
3.0000 mL | Freq: Two times a day (BID) | INTRAMUSCULAR | Status: DC
  Administered 2014-12-21 – 2014-12-24 (×4): 3 mL via INTRAVENOUS

## 2014-12-21 MED ORDER — ACETAMINOPHEN 650 MG RE SUPP: 650.0000 mg | Freq: Four times a day (QID) | RECTAL | Status: DC | PRN

## 2014-12-21 MED ORDER — DULOXETINE HCL 30 MG PO CPEP
30.0000 mg | ORAL_CAPSULE | Freq: Every day | ORAL | Status: DC
  Administered 2014-12-21 – 2014-12-24 (×4): 30 mg via ORAL
  Filled 2014-12-21 (×5): qty 1

## 2014-12-21 MED ORDER — BISACODYL 10 MG RE SUPP: 10.0000 mg | Freq: Every day | RECTAL | Status: DC | PRN

## 2014-12-21 MED ORDER — ENOXAPARIN SODIUM 40 MG/0.4ML ~~LOC~~ SOLN
40.0000 mg | SUBCUTANEOUS | Status: DC
  Administered 2014-12-21 – 2014-12-22 (×2): 40 mg via SUBCUTANEOUS
  Filled 2014-12-21 (×2): qty 0.4

## 2014-12-21 MED ORDER — ESCITALOPRAM OXALATE 20 MG PO TABS
20.0000 mg | ORAL_TABLET | Freq: Every day | ORAL | Status: DC
  Administered 2014-12-21 – 2014-12-24 (×4): 20 mg via ORAL
  Filled 2014-12-21: qty 2
  Filled 2014-12-21 (×3): qty 1

## 2014-12-21 MED ORDER — LEVALBUTEROL HCL 0.63 MG/3ML IN NEBU: 0.6300 mg | INHALATION_SOLUTION | Freq: Four times a day (QID) | RESPIRATORY_TRACT | Status: DC | PRN

## 2014-12-21 MED ORDER — HYDROMORPHONE HCL 1 MG/ML IJ SOLN
1.0000 mg | Freq: Once | INTRAMUSCULAR | Status: AC
  Administered 2014-12-21: 1 mg via INTRAVENOUS
  Filled 2014-12-21: qty 1

## 2014-12-21 MED ORDER — DOCUSATE SODIUM 100 MG PO CAPS
100.0000 mg | ORAL_CAPSULE | Freq: Two times a day (BID) | ORAL | Status: DC
  Administered 2014-12-21 – 2014-12-22 (×3): 100 mg via ORAL
  Filled 2014-12-21 (×4): qty 1

## 2014-12-21 MED ORDER — ALUM & MAG HYDROXIDE-SIMETH 200-200-20 MG/5ML PO SUSP: 30.0000 mL | Freq: Four times a day (QID) | ORAL | Status: DC | PRN

## 2014-12-21 MED ORDER — ONDANSETRON HCL 4 MG/2ML IJ SOLN
4.0000 mg | Freq: Once | INTRAMUSCULAR | Status: AC
  Administered 2014-12-21: 4 mg via INTRAVENOUS
  Filled 2014-12-21: qty 2

## 2014-12-21 MED ORDER — SODIUM CHLORIDE 0.9 % IV SOLN: INTRAVENOUS | Status: DC

## 2014-12-21 MED ORDER — POTASSIUM CHLORIDE IN NACL 40-0.9 MEQ/L-% IV SOLN
INTRAVENOUS | Status: DC
  Administered 2014-12-21 – 2014-12-22 (×2): 100 mL/h via INTRAVENOUS
  Administered 2014-12-23: 50 mL/h via INTRAVENOUS
  Administered 2014-12-23: 100 mL/h via INTRAVENOUS
  Filled 2014-12-21 (×6): qty 1000

## 2014-12-21 MED ORDER — ONDANSETRON HCL 4 MG/2ML IJ SOLN
4.0000 mg | Freq: Four times a day (QID) | INTRAMUSCULAR | Status: DC | PRN
  Administered 2014-12-22 – 2014-12-23 (×3): 4 mg via INTRAVENOUS
  Filled 2014-12-21 (×3): qty 2

## 2014-12-21 MED ORDER — ONDANSETRON HCL 4 MG PO TABS
4.0000 mg | ORAL_TABLET | Freq: Four times a day (QID) | ORAL | Status: DC | PRN
  Administered 2014-12-23: 4 mg via ORAL
  Filled 2014-12-21: qty 1

## 2014-12-21 MED ORDER — ACETAMINOPHEN 325 MG PO TABS
650.0000 mg | ORAL_TABLET | Freq: Four times a day (QID) | ORAL | Status: DC | PRN
  Administered 2014-12-22: 650 mg via ORAL
  Filled 2014-12-21: qty 2

## 2014-12-21 MED ORDER — CLONAZEPAM 0.5 MG PO TABS
0.5000 mg | ORAL_TABLET | Freq: Three times a day (TID) | ORAL | Status: DC | PRN
  Administered 2014-12-21: 0.5 mg via ORAL
  Filled 2014-12-21: qty 1

## 2014-12-21 NOTE — H&P (Signed)
[facility] History and Physical  David Conrad OJJ:[id number] DOB: Jan 29, 1971 DOA: 12/21/2014  Referring physician:   PCP: PROVIDER NOT IN SYSTEM   Chief Complaint:  Abdominal pain   HPI:  44 y.o. 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 He last received his chemotherapy on April 19. Patient was admitted from 12/12/2014,Discharge  12/18/2014 for ileus versus partial small bowel obstruction. Repeat CT scan showed that oral contrast had progressed, surgery was consulted but he was treated conservatively and nonsurgically. He actually improved after being initiated on Reglan and was sent home on by mouth Reglan. After his discharge on 4/29 the patient had recurrence of his symptoms with generalized abdominal pain and vomiting. Patient is on high-dose narcotics at home. Patient has been off of his anticoagulation 1 week. Patient found to have a potassium of 3.2, hemoglobin stable at 10.6, abdominal KUB concerning for early small bowel obstruction. Patient was seen by   oncology, general surgery, psychiatry during his last admission. Oncology recommended aggressive debulking and intraperitoneal chemotherapy , however the patient has been hesitant to consider this as a treatment option. Marland Kitchen   He lost about 14 pounds in the last 2 weeks. Has had decreased activity level.      Review of Systems - History obtained from the patient General ROS: positive for - fatigue Psychological ROS: positive for - anxiety Ophthalmic ROS: negative ENT ROS: negative Allergy and Immunology ROS: negative Hematological and Lymphatic ROS: negative Endocrine ROS: negative Respiratory ROS: no cough, shortness of breath, or wheezing Cardiovascular ROS: no chest pain or dyspnea on exertion Gastrointestinal ROS: as in hpi Genito-Urinary ROS: no dysuria, trouble voiding, or hematuria Musculoskeletal ROS: negative Neurological ROS: no TIA or stroke symptoms Dermatological  ROS: negative       Past Medical History  Diagnosis Date  . Cancer     colon  . Colon cancer      Past Surgical History  Procedure Laterality Date  . Abdominal surgery        Social History:  reports that he has never smoked. He has never used smokeless tobacco. He reports that he does not drink alcohol or use illicit drugs.    No Known Allergies  Family History  Problem Relation Age of Onset  . Colon cancer Maternal Grandfather         Prior to Admission medications   Medication Sig Start Date End Date Taking? Authorizing Provider  clonazePAM (KLONOPIN) 0.5 MG tablet Take 0.5 mg by mouth 3 (three) times daily as needed for anxiety.   Yes Historical Provider, MD  DULoxetine (CYMBALTA) 30 MG capsule Take 30 mg by mouth daily.   Yes Historical Provider, MD  escitalopram (LEXAPRO) 20 MG tablet Take 20 mg by mouth daily.   Yes Historical Provider, MD  metoCLOPramide (REGLAN) 10 MG tablet Take 10 mg by mouth 4 (four) times daily.   Yes Historical Provider, MD  morphine (MS CONTIN) 30 MG 12 hr tablet Take 30 mg by mouth every 12 (twelve) hours.   Yes Historical Provider, MD  oxyCODONE (OXY IR/ROXICODONE) 5 MG immediate release tablet Take 5 mg by mouth every 4 (four) hours as needed for severe pain.   Yes Historical Provider, MD     Physical Exam: Filed Vitals:   12/21/14 0615 12/21/14 0700 12/21/14 0736 12/21/14 0922  BP: 144/91 100/64 109/63 106/64  Pulse: 76 65 81 76  Temp: 97.5 F (36.4 C)     TempSrc: Oral  Resp: 22  18 16   Height: 5\' 7"  (1.702 m)     Weight: 90.719 kg (200 lb)     SpO2: 100% 98% 100% 99%    General appearance: alert, cooperative, appears stated age and moderate discomfort Head: Normocephalic, without obvious abnormality, atraumatic Eyes: conjunctivae/corneas clear. PERRL, EOM's intact.  Throat: lips, mucosa, and tongue normal; teeth and gums normal Neck: no adenopathy, no carotid bruit, no JVD, supple, symmetrical, trachea midline  and thyroid not enlarged, symmetric, no tenderness/mass/nodules Resp: clear to auscultation bilaterally Cardio: regular rate and rhythm, S1, S2 normal, no murmur, click, rub or gallop GI: Soft. Tender diffusely without any rebound, rigidity or guarding. No masses or organomegaly. Fullness is appreciated in the lower abdomen. Extremities: extremities normal, atraumatic, no cyanosis or edema Pulses: 2+ and symmetric Skin: Skin color, texture, turgor normal. No rashes or lesions Lymph nodes: Cervical, supraclavicular, and axillary nodes normal. Neurologic: Awake and alert. Oriented 3. No focal neurological deficits.      Labs on Admission:    Basic Metabolic Panel:  Recent Labs Lab 12/21/14 0633  NA 136  K 3.2*  CL 108  CO2 22  GLUCOSE 140*  BUN 9  CREATININE 0.79  CALCIUM 8.4*   Liver Function Tests:  Recent Labs Lab 12/21/14 0633  AST 13*  ALT 22  ALKPHOS 76  BILITOT 0.2*  PROT 6.5  ALBUMIN 3.3*    Recent Labs Lab 12/21/14 0633  LIPASE 29   No results for input(s): AMMONIA in the last 168 hours. CBC:  Recent Labs Lab 12/21/14 0633  WBC 5.3  NEUTROABS 3.1  HGB 10.6*  HCT 33.9*  MCV 77.6*  PLT 268   Cardiac Enzymes: No results for input(s): CKTOTAL, CKMB, CKMBINDEX, TROPONINI in the last 168 hours.  BNP (last 3 results) No results for input(s): BNP in the last 8760 hours.  ProBNP (last 3 results) No results for input(s): PROBNP in the last 8760 hours.     CBG: No results for input(s): GLUCAP in the last 168 hours.  Radiological Exams on Admission: Dg Abd Acute W/chest  12/21/2014   CLINICAL DATA:  Nausea and vomiting with abdominal distention  EXAM: DG ABDOMEN ACUTE W/ 1V CHEST  COMPARISON:  12/17/2014  FINDINGS: Cardiac shadow is within normal limits. Mild bibasilar atelectasis is seen. A right chest wall port is noted. No focal confluent infiltrate is seen.  Paucity of bowel gas is noted. A few air-fluid levels are noted in the right mid  abdomen similar to that seen on prior exam. Multiple fluid-filled loops of small bowel are noted. No free air is seen. No abnormal mass or abnormal calcifications are noted. Postsurgical changes are noted in right mid abdomen.  IMPRESSION: Multiple fluid-filled filled loops of small bowel with diffuse scattered air-fluid levels. These changes may represent an early small bowel obstruction. Correlation with the physical exam is recommended.   Electronically Signed   By: Inez Catalina M.D.   On: 12/21/2014 07:16    EKG: Independently reviewed. 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 Surgery consultation has been requested. Ileus cannot be ruled out given electrolyte abnormalities We'll keep him nothing by mouth for now. Pain control and antiemetics as needed. Hold off on NG tube unless he  Starts  vomiting, stool softener, trial of Dulcolax given the patient is on narcotics and may be constipated, hydration, treat underlying electrolyte abnormalities,  #2 Metastatic colon cancer on chemotherapy: Last chemotherapy was on April 19. Apparently, patient has significant tumor burden in his abdomen. Plan was for the patient to [facility] to [facility] surgery and intraperitoneal chemotherapy. Apparently surgery was scheduled, but the patient canceled it as he was not certain. I will let his oncologist discuss this issue further with the patient. Seen by Dr. Jonette Eva will hold off on repeat consultation this admission unless something changes, hemoglobin stable  #3 Hypokalemia: Will be repleted intravenously  #5 Microcytic anemia: Hemoglobin close to baseline, although he could be hemoconcentrated due to dehydration. Continue to monitor.  Code  Status:   full Family Communication: bedside Disposition Plan: admit   Time spent: 70 mins   [facility] Pager [phone]  If 7PM-7AM, please contact night-coverage [email or web address] Password [facility] 12/21/2014, 10:32 AM

## 2014-12-21 NOTE — ED Notes (Signed)
Bed: VG71 Expected date:  Expected time:  Means of arrival:  Comments: EMS abd pain, rectal cancer

## 2014-12-21 NOTE — ED Notes (Signed)
Pt transported to XRAY °

## 2014-12-21 NOTE — ED Notes (Addendum)
Pt from home via [facility] c/o generalized abdominal pain and vomiting. Pt has HX of stage IV Colon CA. He takes morphine at home today without relief. 18G Left AC IV. Enroute patient was given 23mcg fentanyl, 4mg  Zofran, and 892mL NS Bolus.  Per Wife patient has been off his Warfarin x 1 week. Pt abdomen appears distended. Hx of Bowel obstruction and was just D/C from here on Friday for same.

## 2014-12-21 NOTE — Care Management Note (Signed)
Case Management Note  Patient Details  Name: David Conrad MRN: [id number] Date of Birth: 01-Dec-1970  Subjective/Objective:       44 y/o m admitted w/ileus vs SBO. Readmit.             Action/Plan:From home.   Expected Discharge Date:   (UNKNOWN)               Expected Discharge Plan:  Home/Self Care  In-House Referral:     Discharge planning Services  CM Consult  Post Acute Care Choice:    Choice offered to:     DME Arranged:    DME Agency:     HH Arranged:    HH Agency:     Status of Service:  In process, will continue to follow  Medicare Important Message Given:    Date Medicare IM Given:    Medicare IM give by:    Date Additional Medicare IM Given:    Additional Medicare Important Message give by:     If discussed at [place] of Stay Meetings, dates discussed:    Additional Comments:  Dessa Phi, RN 12/21/2014, 2:56 PM

## 2014-12-21 NOTE — ED Notes (Signed)
Spoke to [employer] charge nurse pt can go at 13:05.Marland KitchenKLJ

## 2014-12-21 NOTE — ED Provider Notes (Signed)
CSN: [id number]     Arrival date & time 12/21/14  3244 History   First MD Initiated Contact with Patient 12/21/14 [phone]     Chief Complaint  Patient presents with  . Abdominal Pain  . Emesis  . Colon Cancer     (Consider location/radiation/quality/duration/timing/severity/associated sxs/prior Treatment) Patient is a 44 y.o. male presenting with abdominal pain. The history is provided by the patient and the spouse. No language interpreter was used.  Abdominal Pain Pain location:  Generalized Pain quality: aching, bloating and stabbing   Pain radiates to:  Does not radiate Pain severity:  Moderate Onset quality:  Gradual Duration:  1 day Progression:  Worsening Chronicity:  New Context: recent illness   Relieved by:  Nothing Worsened by:  Nothing tried Ineffective treatments:  None tried Associated symptoms: diarrhea and vomiting   Risk factors: recent hospitalization   Pt has colon cancer.  He is followed by Dr. Marin Olp.  Pt was just discharged Friday after a bowel blockage.   Past Medical History  Diagnosis Date  . Cancer     colon   Past Surgical History  Procedure Laterality Date  . Abdominal surgery     Family History  Problem Relation Age of Onset  . Colon cancer Maternal Grandfather    History  Substance Use Topics  . Smoking status: Never Smoker   . Smokeless tobacco: Never Used  . Alcohol Use: No    Review of Systems  Gastrointestinal: Positive for vomiting, abdominal pain and diarrhea.  All other systems reviewed and are negative.     Allergies  Review of patient's allergies indicates no known allergies.  Home Medications   Prior to Admission medications   Not on File   BP 144/91 mmHg  Pulse 76  Temp(Src) 97.5 F (36.4 C) (Oral)  Resp 22  Ht 5\' 7"  (1.702 m)  Wt 200 lb (90.719 kg)  BMI 31.32 kg/m2  SpO2 100% Physical Exam  Constitutional: He is oriented to person, place, and time. He appears well-developed and well-nourished.  HENT:   Head: Normocephalic and atraumatic.  Right Ear: External ear normal.  Left Ear: External ear normal.  Nose: Nose normal.  Mouth/Throat: Oropharynx is clear and moist.  Eyes: EOM are normal. Pupils are equal, round, and reactive to light.  Neck: Normal range of motion.  Cardiovascular: Normal rate and normal heart sounds.   Pulmonary/Chest: Effort normal and breath sounds normal.  Abdominal: He exhibits no distension. There is tenderness.  Musculoskeletal: Normal range of motion.  Neurological: He is alert and oriented to person, place, and time.  Psychiatric: He has a normal mood and affect.  Nursing note and vitals reviewed.   ED Course  Procedures (including critical care time) Labs Review Labs Reviewed - No data to display  Imaging Review No results found.   EKG Interpretation None      Results for orders placed or performed during the hospital encounter of 12/21/14  CBC with Differential/Platelet  Result Value Ref Range   WBC 5.3 4.0 - 10.5 K/uL   RBC 4.37 4.22 - 5.81 MIL/uL   Hemoglobin 10.6 (L) 13.0 - 17.0 g/dL   HCT 33.9 (L) 39.0 - 52.0 %   MCV 77.6 (L) 78.0 - 100.0 fL   MCH 24.3 (L) 26.0 - 34.0 pg   MCHC 31.3 30.0 - 36.0 g/dL   RDW 17.3 (H) 11.5 - 15.5 %   Platelets 268 150 - 400 K/uL   Neutrophils Relative % 59 43 - 77 %  Neutro Abs 3.1 1.7 - 7.7 K/uL   Lymphocytes Relative 27 12 - 46 %   Lymphs Abs 1.4 0.7 - 4.0 K/uL   Monocytes Relative 10 3 - 12 %   Monocytes Absolute 0.5 0.1 - 1.0 K/uL   Eosinophils Relative 4 0 - 5 %   Eosinophils Absolute 0.2 0.0 - 0.7 K/uL   Basophils Relative 0 0 - 1 %   Basophils Absolute 0.0 0.0 - 0.1 K/uL  Comprehensive metabolic panel  Result Value Ref Range   Sodium 136 135 - 145 mmol/L   Potassium 3.2 (L) 3.5 - 5.1 mmol/L   Chloride 108 101 - 111 mmol/L   CO2 22 22 - 32 mmol/L   Glucose, Bld 140 (H) 70 - 99 mg/dL   BUN 9 6 - 20 mg/dL   Creatinine, Ser 0.79 0.61 - 1.24 mg/dL   Calcium 8.4 (L) 8.9 - 10.3 mg/dL    Total Protein 6.5 6.5 - 8.1 g/dL   Albumin 3.3 (L) 3.5 - 5.0 g/dL   AST 13 (L) 15 - 41 U/L   ALT 22 17 - 63 U/L   Alkaline Phosphatase 76 38 - 126 U/L   Total Bilirubin 0.2 (L) 0.3 - 1.2 mg/dL   GFR calc non Af Amer >60 >60 mL/min   GFR calc Af Amer >60 >60 mL/min   Anion gap 6 5 - 15  Urinalysis, Routine w reflex microscopic  Result Value Ref Range   Color, Urine YELLOW YELLOW   APPearance CLOUDY (A) CLEAR   Specific Gravity, Urine 1.025 1.005 - 1.030   pH 5.5 5.0 - 8.0   Glucose, UA NEGATIVE NEGATIVE mg/dL   Hgb urine dipstick NEGATIVE NEGATIVE   Bilirubin Urine NEGATIVE NEGATIVE   Ketones, ur NEGATIVE NEGATIVE mg/dL   Protein, ur NEGATIVE NEGATIVE mg/dL   Urobilinogen, UA 0.2 0.0 - 1.0 mg/dL   Nitrite NEGATIVE NEGATIVE   Leukocytes, UA NEGATIVE NEGATIVE  Lipase, blood  Result Value Ref Range   Lipase 29 22 - 51 U/L   Dg Abd Acute W/chest  12/21/2014   CLINICAL DATA:  Nausea and vomiting with abdominal distention  EXAM: DG ABDOMEN ACUTE W/ 1V CHEST  COMPARISON:  12/17/2014  FINDINGS: Cardiac shadow is within normal limits. Mild bibasilar atelectasis is seen. A right chest wall port is noted. No focal confluent infiltrate is seen.  Paucity of bowel gas is noted. A few air-fluid levels are noted in the right mid abdomen similar to that seen on prior exam. Multiple fluid-filled loops of small bowel are noted. No free air is seen. No abnormal mass or abnormal calcifications are noted. Postsurgical changes are noted in right mid abdomen.  IMPRESSION: Multiple fluid-filled filled loops of small bowel with diffuse scattered air-fluid levels. These changes may represent an early small bowel obstruction. Correlation with the physical exam is recommended.   Electronically Signed   By: Inez Catalina M.D.   On: 12/21/2014 07:16     MDM  Pt feels better after pain medications.   I spoke to Dr. Allyson Sabal who will see.   Will jennings General Surgery will see for Surgery.   Final diagnoses:   Abdominal pain  Small bowel obstruction        Fransico Meadow, PA-C 12/21/14 [place], MD 12/23/14 [phone]

## 2014-12-21 NOTE — Progress Notes (Signed)
pcp is [facility] [place] [id number] [phone]

## 2014-12-21 NOTE — Progress Notes (Signed)
Report received from E. Pelletier, RN. I agree with previous RN's assessment. Will continue to monitor pt closely. Asaro, Atoya Andrew I  

## 2014-12-21 NOTE — Progress Notes (Signed)
UR completed 

## 2014-12-21 NOTE — Progress Notes (Signed)
12/21/14 1317 Spoke with Dr Reynaldo Minium.  Pt had ileus vs SBO during last admission 12/12/14 to 12/18/14. No f/u with pcp, telephone call for oncologist.  Returned with Dx partial SBO Determined to be second related/unpreventable- disease state

## 2014-12-21 NOTE — ED Notes (Signed)
Pt aware of the need for a urine sample. Urinal at bedside. 

## 2014-12-22 ENCOUNTER — Ambulatory Visit: Payer: Medicare Other | Admitting: Hematology & Oncology

## 2014-12-22 ENCOUNTER — Ambulatory Visit: Payer: Medicare Other

## 2014-12-22 ENCOUNTER — Other Ambulatory Visit: Payer: Medicare Other

## 2014-12-22 ENCOUNTER — Encounter (HOSPITAL_COMMUNITY): Payer: Self-pay | Admitting: Radiology

## 2014-12-22 ENCOUNTER — Inpatient Hospital Stay (HOSPITAL_COMMUNITY): Payer: Medicare Other

## 2014-12-22 DIAGNOSIS — R109 Unspecified abdominal pain: Secondary | ICD-10-CM

## 2014-12-22 DIAGNOSIS — D649 Anemia, unspecified: Secondary | ICD-10-CM

## 2014-12-22 DIAGNOSIS — C786 Secondary malignant neoplasm of retroperitoneum and peritoneum: Secondary | ICD-10-CM

## 2014-12-22 LAB — COMPREHENSIVE METABOLIC PANEL
ALT: 19 U/L (ref 17–63)
AST: 12 U/L — ABNORMAL LOW (ref 15–41)
Albumin: 3.1 g/dL — ABNORMAL LOW (ref 3.5–5.0)
Alkaline Phosphatase: 76 U/L (ref 38–126)
Anion gap: 9 (ref 5–15)
BUN: 6 mg/dL (ref 6–20)
CO2: 25 mmol/L (ref 22–32)
Calcium: 9.1 mg/dL (ref 8.9–10.3)
Chloride: 106 mmol/L (ref 101–111)
Creatinine, Ser: 0.83 mg/dL (ref 0.61–1.24)
GFR calc non Af Amer: >60 mL/min (ref >60)
GLUCOSE: 94 mg/dL (ref 70–99)
Potassium: 3.9 mmol/L (ref 3.5–5.1)
Sodium: 140 mmol/L (ref 135–145)
Total Bilirubin: 0.8 mg/dL (ref 0.3–1.2)
Total Protein: 6.4 g/dL — ABNORMAL LOW (ref 6.5–8.1)

## 2014-12-22 LAB — CBC
HCT: 34.4 % — ABNORMAL LOW (ref 39.0–52.0)
HEMOGLOBIN: 10.4 g/dL — AB (ref 13.0–17.0)
MCH: 23.6 pg — AB (ref 26.0–34.0)
MCHC: 30.2 g/dL (ref 30.0–36.0)
MCV: 78.2 fL (ref 78.0–100.0)
Platelets: 297 10*3/uL (ref 150–400)
RBC: 4.4 MIL/uL (ref 4.22–5.81)
RDW: 17.7 % — ABNORMAL HIGH (ref 11.5–15.5)
WBC: 4.4 10*3/uL (ref 4.0–10.5)

## 2014-12-22 LAB — PREALBUMIN: Prealbumin: 18.5 mg/dL (ref 18–38)

## 2014-12-22 MED ORDER — IOHEXOL 300 MG/ML  SOLN
100.0000 mL | Freq: Once | INTRAMUSCULAR | Status: AC | PRN
  Administered 2014-12-22: 100 mL via INTRAVENOUS

## 2014-12-22 MED ORDER — IOHEXOL 300 MG/ML  SOLN
25.0000 mL | INTRAMUSCULAR | Status: AC
  Administered 2014-12-22 (×2): 25 mL via ORAL

## 2014-12-22 MED ORDER — ZOLPIDEM TARTRATE 5 MG PO TABS
5.0000 mg | ORAL_TABLET | Freq: Once | ORAL | Status: AC
  Administered 2014-12-22: 5 mg via ORAL
  Filled 2014-12-22: qty 1

## 2014-12-22 MED ORDER — MAGNESIUM SULFATE 2 GM/50ML IV SOLN
2.0000 g | Freq: Once | INTRAVENOUS | Status: AC
  Administered 2014-12-22: 2 g via INTRAVENOUS
  Filled 2014-12-22: qty 50

## 2014-12-22 NOTE — Progress Notes (Signed)
Patient very upset this morning and crying.  Have called hospital chaplain to speak with patient.  Have spent some time trying to talk to patient. Will continue to monitor.

## 2014-12-22 NOTE — Consult Note (Signed)
Referral MD  Reason for Referral: Metastatic colon cancer with recurrent small bowel obstruction   Chief Complaint  Patient presents with  . Abdominal Pain  . Emesis  . Colon Cancer  : I started to have more pain in my abdomen.  HPI: David Conrad is well-known to me. He is a 44 year old African-American gentleman. He has metastatic colon cancer. He is, in my mind, fairly refractory to treatment. He has been seen at [facility] for the consideration of debulking surgery and intraperitoneal chemotherapy. He has refused to do this to date because he was not mentally ready to undertake this procedure.  He has been admitted several times now. He just was discharged about 3 days ago. He had another episode of partial small bowel obstruction or possibly ileus. He was discharged on Reglan.  He was doing pretty well. He was eating and having bowel movements. All of a sudden, he started having recurrence of the abdominal pain. He was subsequently readmitted. An abdominal X ray showed multiple fluid-filled loops of small bowel with some air-fluid levels. I suspect that he probably is developing a small bowel obstruction or possibly having a pseudo-obstruction from peritoneal carcinomatosis.  He actually looks pretty good. He is really not complaining much in the way of pain. He is nothing by mouth right now.  He's had no bleeding. He's had no fever. He's had no leg swelling.   Past Medical History  Diagnosis Date  . Cancer     colon  . Colon cancer   :  Past Surgical History  Procedure Laterality Date  . Abdominal surgery    :   Current facility-administered medications:  .  0.9 % NaCl with KCl 40 mEq / L  infusion, , Intravenous, Continuous, Reyne Dumas, MD, Last Rate: 100 mL/hr at 12/22/14 0557, 100 mL/hr at 12/22/14 0557 .  acetaminophen (TYLENOL) tablet 650 mg, 650 mg, Oral, Q6H PRN **OR** acetaminophen (TYLENOL) suppository 650 mg, 650 mg, Rectal, Q6H PRN, Reyne Dumas, MD .   alum & mag hydroxide-simeth (MAALOX/MYLANTA) 200-200-20 MG/5ML suspension 30 mL, 30 mL, Oral, Q6H PRN, Reyne Dumas, MD .  bisacodyl (DULCOLAX) suppository 10 mg, 10 mg, Rectal, Daily PRN, Reyne Dumas, MD .  clonazePAM (KLONOPIN) tablet 0.5 mg, 0.5 mg, Oral, TID PRN, Reyne Dumas, MD, 0.5 mg at 12/21/14 2118 .  docusate sodium (COLACE) capsule 100 mg, 100 mg, Oral, BID, Reyne Dumas, MD, 100 mg at 12/21/14 2118 .  DULoxetine (CYMBALTA) DR capsule 30 mg, 30 mg, Oral, Daily, Reyne Dumas, MD, 30 mg at 12/21/14 1245 .  enoxaparin (LOVENOX) injection 40 mg, 40 mg, Subcutaneous, Q24H, Reyne Dumas, MD, 40 mg at 12/21/14 1127 .  escitalopram (LEXAPRO) tablet 20 mg, 20 mg, Oral, Daily, Reyne Dumas, MD, 20 mg at 12/21/14 1245 .  levalbuterol (XOPENEX) nebulizer solution 0.63 mg, 0.63 mg, Nebulization, Q6H PRN, Reyne Dumas, MD .  ondansetron (ZOFRAN) tablet 4 mg, 4 mg, Oral, Q6H PRN **OR** ondansetron (ZOFRAN) injection 4 mg, 4 mg, Intravenous, Q6H PRN, Reyne Dumas, MD .  sodium chloride 0.9 % injection 3 mL, 3 mL, Intravenous, Q12H, Reyne Dumas, MD, 3 mL at 12/21/14 1037:  . docusate sodium  100 mg Oral BID  . DULoxetine  30 mg Oral Daily  . enoxaparin (LOVENOX) injection  40 mg Subcutaneous Q24H  . escitalopram  20 mg Oral Daily  . sodium chloride  3 mL Intravenous Q12H  :  No Known Allergies:  Family History  Problem Relation Age of Onset  .  Colon cancer Maternal Grandfather   :  History   Social History  . Marital Status: Married    Spouse Name: N/A  . Number of Children: N/A  . Years of Education: N/A   Occupational History  . Not on file.   Social History Main Topics  . Smoking status: Never Smoker   . Smokeless tobacco: Never Used  . Alcohol Use: No  . Drug Use: No  . Sexual Activity: Not on file   Other Topics Concern  . Not on file   Social History Narrative  :  Pertinent items are noted in HPI.  Exam: Patient Vitals for the past 24 hrs:  BP Temp Temp src  Pulse Resp SpO2  12/22/14 0339 117/72 mmHg 98 F (36.7 C) Oral 69 20 99 %  12/21/14 2126 120/76 mmHg 98.3 F (36.8 C) Oral 70 16 100 %  12/21/14 1317 128/90 mmHg 98.1 F (36.7 C) Oral 78 16 97 %  12/21/14 1246 113/75 mmHg - - 85 16 100 %  12/21/14 1200 109/77 mmHg - - 67 22 97 %  12/21/14 1130 119/81 mmHg - - 70 22 100 %  12/21/14 1037 116/72 mmHg 97.7 F (36.5 C) Oral 73 18 100 %  12/21/14 0922 106/64 mmHg - - 76 16 99 %  12/21/14 0736 109/63 mmHg - - 81 18 100 %    as above    Recent Labs  12/21/14 0633 12/22/14 0437  WBC 5.3 4.4  HGB 10.6* 10.4*  HCT 33.9* 34.4*  PLT 268 297    Recent Labs  12/21/14 0633 12/22/14 0437  NA 136 PENDING  K 3.2* PENDING  CL 108 PENDING  CO2 22 PENDING  GLUCOSE 140* 94  BUN 9 6  CREATININE 0.79 0.83  CALCIUM 8.4* PENDING    Blood smear review: None  Pathology: None     Assessment and Plan: 44 year old gentleman with metastatic colon cancer. He is on systemic chemotherapy although he really has not had much of this because of hospital admissions and questionable tolerability.  He now is ready to go to [facility] for consideration of debulking surgery. My concern is that he may not be candidate for this anymore. I think go away to know if this worsens her case was for him to be seen at [facility] by Dr.Shen.  I think a CT scan would help this admission.  I would stop his Reglan if there is any concern regarding obstruction.  I know that this is been incredibly hard on him mentally. His been hard on his family. We have tried to get him to understand the importance of having this aggressive surgery because this is probably the best way to give him some long-term quality of life. Again, I don't know if the "window of opportunity" has passed.  I would also check a prealbumin on him. This may be prognostic.  As always, we had a good prayer session. I would like to hope that if he gets out to [facility] that he will be able to have  surgery.  I appreciate everybody's help with him. I just think we are limited with how much we can do for him in town.  Harriette Ohara 32:27

## 2014-12-22 NOTE — Progress Notes (Signed)
[facility] PROGRESS NOTE   David Conrad PFX:[id number] DOB: 23-Nov-1970 DOA: 12/21/2014 PCP: PROVIDER NOT IN SYSTEM  HPI/Subjective: Pt is doing well this morning. He states he has had two bowel movements this morning and the last time he vomited was yesterday prior to coming to the ED. He is still complaining of mild abdominal pain.   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 Pt takes multiple narcotics at home and that could be the cause of the constipation/ileus. Was admitted on 4/23 and discharged on 4/29 for a partial bowel obstruction that resolved spontaneously. Abdominal xray in ED shows multiple fluid filled loops of small bowel with diffuse scattered air fluid levels.  Awaiting abdominal CT. Keep patient nothing by mouth, previous x-rays showed significant tumor burden, follow clinically.  Colon Carcinoma Metastatic to Mesenteric Region Pt is currently undergoing chemotherapy, last session on 4/19. These sessions have been inconsistent due to frequent hospital admissions.  Oncology has been consulted and seen patient. Recommended prealbumin level. Will continue to have them advise the care regarding his cancer.   Hypokalemia: Resolved, continue to monitor  Microcytic Anemia:  Hgb 10.4 today, continue to monitor.  Code Status: Full Code Family Communication: Plan discussed with the patient. Disposition Plan: Remains inpatient Diet: Diet NPO time specified Except for: Sips with Meds  Consultants:  Oncology  General Surgery  Procedures:  None  Antibiotics:  None    Objective: Filed Vitals:   12/22/14 0339  BP: 117/72  Pulse: 69  Temp: 98 F (36.7 C)  Resp: 20    Intake/Output Summary (Last 24 hours)  at 12/22/14 1110 Last data filed at 12/22/14 0700  Gross per 24 hour  Intake 1956.67 ml  Output      0 ml  Net 1956.67 ml   Filed Weights   12/21/14 0615  Weight: 90.719 kg (200 lb)    Exam: General: Alert and awake, oriented x3, not in any acute distress. HEENT: anicteric sclera, pupils reactive to light and accommodation, EOMI CVS: S1-S2 clear, no murmur rubs or gallops Chest: clear to auscultation bilaterally, no wheezing, rales or rhonchi Abdomen: soft nontender, nondistended, normal bowel sounds, no organomegaly Extremities: no cyanosis, clubbing or edema noted bilaterally Neuro: Cranial nerves II-XII intact, no focal neurological deficits  Data Reviewed: Basic Metabolic Panel:  Recent Labs Lab 12/21/14 0633 12/21/14 0635 12/22/14 0437  NA 136  --  140  K 3.2*  --  3.9  CL 108  --  106  CO2 22  --  25  GLUCOSE 140*  --  94  BUN 9  --  6  CREATININE 0.79  --  0.83  CALCIUM 8.4*  --  9.1  MG  --  1.7  --    Liver Function Tests:  Recent Labs Lab 12/21/14 0633 12/22/14 0437  AST 13* 12*  ALT 22 19  ALKPHOS 76 76  BILITOT 0.2* 0.8  PROT 6.5 6.4*  ALBUMIN 3.3* 3.1*    Recent Labs Lab 12/21/14 0633  LIPASE 29   No results for input(s): AMMONIA in the last 168 hours. CBC:  Recent Labs Lab 12/21/14 0633 12/22/14 0437  WBC 5.3 4.4  NEUTROABS 3.1  --   HGB 10.6* 10.4*  HCT 33.9* 34.4*  MCV 77.6* 78.2  PLT  268 297   Cardiac Enzymes: No results for input(s): CKTOTAL, CKMB, CKMBINDEX, TROPONINI in the last 168 hours. BNP (last 3 results) No results for input(s): BNP in the last 8760 hours.  ProBNP (last 3 results) No results for input(s): PROBNP in the last 8760 hours.  CBG: No results for input(s): GLUCAP in the last 168 hours.  Micro No results found for this or any previous visit (from the past 240 hour(s)).   Studies: Dg Abd Acute W/chest  12/21/2014   CLINICAL DATA:  Nausea and vomiting with abdominal distention  EXAM: DG ABDOMEN  ACUTE W/ 1V CHEST  COMPARISON:  12/17/2014  FINDINGS: Cardiac shadow is within normal limits. Mild bibasilar atelectasis is seen. A right chest wall port is noted. No focal confluent infiltrate is seen.  Paucity of bowel gas is noted. A few air-fluid levels are noted in the right mid abdomen similar to that seen on prior exam. Multiple fluid-filled loops of small bowel are noted. No free air is seen. No abnormal mass or abnormal calcifications are noted. Postsurgical changes are noted in right mid abdomen.  IMPRESSION: Multiple fluid-filled filled loops of small bowel with diffuse scattered air-fluid levels. These changes may represent an early small bowel obstruction. Correlation with the physical exam is recommended.   Electronically Signed   By: Inez Catalina M.D.   On: 12/21/2014 07:16    Scheduled Meds: . docusate sodium  100 mg Oral BID  . DULoxetine  30 mg Oral Daily  . enoxaparin (LOVENOX) injection  40 mg Subcutaneous Q24H  . escitalopram  20 mg Oral Daily  . sodium chloride  3 mL Intravenous Q12H   Continuous Infusions: . 0.9 % NaCl with KCl 40 mEq / L 100 mL/hr (12/22/14 0557)   Time spent: 35 minutes  David Hippo, PA-S  Verlee Monte, MD [facility] Pager [phone] If 7PM-7AM, please contact night-coverage at [email or web address], password [facility] 12/22/2014, 11:10 AM  LOS: 1 day       Addendum  Patient seen and examined, chart and data base reviewed.  I agree with the above assessment and plan.  For full details please see Mr. David Hippo, PA-S note.  I reviewed and amended the above note as appropriate.   Birdie Hopes, MD [facility] Pager: [phone] 12/22/2014, 2:18 PM

## 2014-12-22 NOTE — Progress Notes (Signed)
Patient stated that he had 'a good bowel movement'.  Will continue to monitor.

## 2014-12-23 ENCOUNTER — Inpatient Hospital Stay (HOSPITAL_COMMUNITY): Payer: Medicare Other

## 2014-12-23 DIAGNOSIS — C772 Secondary and unspecified malignant neoplasm of intra-abdominal lymph nodes: Secondary | ICD-10-CM

## 2014-12-23 DIAGNOSIS — D509 Iron deficiency anemia, unspecified: Secondary | ICD-10-CM

## 2014-12-23 DIAGNOSIS — C189 Malignant neoplasm of colon, unspecified: Secondary | ICD-10-CM

## 2014-12-23 LAB — BASIC METABOLIC PANEL
Anion gap: 9 (ref 5–15)
BUN: 6 mg/dL (ref 6–20)
CALCIUM: 9.2 mg/dL (ref 8.9–10.3)
CO2: 25 mmol/L (ref 22–32)
Chloride: 107 mmol/L (ref 101–111)
Creatinine, Ser: 0.95 mg/dL (ref 0.61–1.24)
GFR calc Af Amer: >60 mL/min (ref >60)
GLUCOSE: 96 mg/dL (ref 70–99)
Potassium: 4.4 mmol/L (ref 3.5–5.1)
Sodium: 141 mmol/L (ref 135–145)

## 2014-12-23 LAB — CBC
HCT: 36.8 % — ABNORMAL LOW (ref 39.0–52.0)
HEMOGLOBIN: 10.9 g/dL — AB (ref 13.0–17.0)
MCH: 23.3 pg — ABNORMAL LOW (ref 26.0–34.0)
MCHC: 29.6 g/dL — ABNORMAL LOW (ref 30.0–36.0)
MCV: 78.8 fL (ref 78.0–100.0)
PLATELETS: 321 10*3/uL (ref 150–400)
RBC: 4.67 MIL/uL (ref 4.22–5.81)
RDW: 17.8 % — ABNORMAL HIGH (ref 11.5–15.5)
WBC: 4.6 10*3/uL (ref 4.0–10.5)

## 2014-12-23 LAB — MAGNESIUM: Magnesium: 2.2 mg/dL (ref 1.7–2.4)

## 2014-12-23 MED ORDER — ZOLPIDEM TARTRATE 5 MG PO TABS
5.0000 mg | ORAL_TABLET | Freq: Every evening | ORAL | Status: DC | PRN
  Administered 2014-12-23: 5 mg via ORAL
  Filled 2014-12-23: qty 1

## 2014-12-23 MED ORDER — ZOLPIDEM TARTRATE 5 MG PO TABS: 5.0000 mg | ORAL_TABLET | Freq: Once | ORAL | Status: DC

## 2014-12-23 NOTE — Progress Notes (Signed)
Chaplain first visited patient on 12/16/14. Patient was discharged, but had to be readmitted. Patient's mental state was not good, because of the diagnosis that he had received. Chaplain was called to visit patient. Patient was very tearful and Chaplain provided emotional support and prayer to patient on yesterday.

## 2014-12-23 NOTE — Progress Notes (Signed)
[name] feels better this morning. He had a couple bowel movements yesterday. He's not complaining much in way of abdominal pain.  He did have his CAT scan done yesterday. For some reason, the results are not yet back.  His labs look okay.  He is out of bed. He is ambulating. He wants to try to eat. Reasserted Trima some full liquids.  On his physical exam, all of his vital signs stable. His temperature 98. Lead pressure 113/79. Pulse is 80. His head and neck exam shows no ocular or oral lesions. There are no palpable cervical or supraclavicular lymph nodes. Lungs are clear. Cardiac exam regular rate and rhythm with no murmurs, rubs or bruits. Abdomen is soft. Bowel sounds are actually pretty good today. There is no guarding or rebound tenderness. He has no obvious abdominal mass. There is no fluid wave. Extremities shows no clubbing, cyanosis or edema.  I did speak with one of the surgeons out of [facility]. [name] main surgeon, Dr. Crisoforo Oxford, is out of town this week. However, the surgeon that I spoke to, Dr. Carlis Abbott, says that they would be more and having to get him out there and see him and see about surgical intervention and intraperitoneal chemotherapy for his recurrent colon cancer.  I think that if [name] tolerates full liquids today, is going to the bathroom, and ambulating, then tomorrow he should be oh to be discharged. I told him that once he is discharged, to make an appointment to see the surgeons at [facility].  I'm just glad that he is feeling better. He sees being a better frame of mind. We had a very good prayer session!!  David E.  Psalm 27:1

## 2014-12-23 NOTE — Progress Notes (Addendum)
  PROGRESS NOTE  DAVIONE LENKER QRF:[id number] DOB: 05-16-1971 DOA: 12/21/2014 PCP: PROVIDER NOT IN SYSTEM [facility]  Dr. Marin Olp  Summary: 21yom with metastatic colon cancer admitted for partial SBO vs ileus.  Assessment/Plan: 1. Partial SBO. Resolved. Associated nausea and vomiting have resolved. 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  Narcotics could contribute. 2. Metastatic colon cancer patient has significant tumor burden in his abdomen. Plan to go to [facility] to consider surgery and intraperitoneal chemotherapy. 3. Microcytic anemia, stable. 4. D/c 12/18/2014 ileus versus partial SBO   Overall improved with clinical resolution of partial SBO.  Advance diet. If no recurrent symptoms, anticipate discharge 5/5.  No Reglan.   Code Status: full code DVT prophylaxis: SCDs Family Communication:  Disposition Plan: home  Murray Hodgkins, MD  [facility]  Pager [phone] If 7PM-7AM, please contact night-coverage at [email or web address], password [facility] 12/23/2014, 6:08 PM  LOS: 2 days   Consultants:  Oncology   Procedures:    Antibiotics:    HPI/Subjective: Feeling better. No abdominal pain. No nausea or vomiting. Passing gas. Bowels moving. Tolerating diet.  Objective: Filed Vitals:   12/22/14 1325 12/22/14 2100 12/23/14 0528 12/23/14 1419  BP: 124/84 110/73 113/79 119/83  Pulse: 84 82 80 69  Temp: 98.2 F (36.8 C) 98.9 F (37.2 C) 98 F (36.7 C) 98.2 F (36.8 C)  TempSrc: Oral Oral Oral Oral  Resp: 20 20 20 20   Height:      Weight:      SpO2: 100% 99% 99% 100%    Intake/Output Summary (Last 24 hours) at 12/23/14 1808 Last data filed at 12/23/14 1500  Gross per 24 hour  Intake 901.67 ml  Output      0 ml  Net 901.67 ml     Filed Weights   12/21/14 0615  Weight: 90.719 kg (200 lb)    Exam:     Afebrile, vital signs stable. No hypoxia. General:  Appears calm and  comfortable Cardiovascular: RRR, no m/r/g. No LE edema. Respiratory: CTA bilaterally, no w/r/r. Normal respiratory effort. Abdomen: soft, ntnd, positive bowel sounds Psychiatric: grossly normal mood and affect, speech fluent and appropriate Neurologic: grossly non-focal.  New data reviewed:  Basic metabolic panel unremarkable  Hemoglobin stable 10.9.  Pertinent data since admission:  CT abdomen and pelvis, no acute changes. No bowel obstruction.   Scheduled Meds: . DULoxetine  30 mg Oral Daily  . escitalopram  20 mg Oral Daily  . sodium chloride  3 mL Intravenous Q12H   Continuous Infusions: . 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

## 2014-12-23 NOTE — Progress Notes (Signed)
Patient requested Ambien again tonight.  PCP was notified.

## 2014-12-23 NOTE — Progress Notes (Signed)
   12/23/14 0900  Clinical Encounter Type  Visited With Patient and family together;Patient  Visit Type Initial;Follow-up;Spiritual support;Social support  Referral From Nurse  Spiritual Encounters  Spiritual Needs Emotional;Prayer;Grief support

## 2014-12-24 ENCOUNTER — Encounter: Payer: Self-pay | Admitting: Nurse Practitioner

## 2014-12-24 MED ORDER — ONDANSETRON HCL 4 MG PO TABS: 4.0000 mg | ORAL_TABLET | Freq: Three times a day (TID) | ORAL | Status: AC | PRN

## 2014-12-24 NOTE — Discharge Summary (Addendum)
Physician Discharge Summary  David Conrad TOI:[id number] DOB: May 29, 1971 DOA: 12/21/2014  PCP: PROVIDER NOT IN SYSTEM  Admit date: 12/21/2014 Discharge date: 12/24/2014  Recommendations for Outpatient Follow-up:  1. Follow-up with [facility] for consideration of surgery. The patient states he will coordinate this. 2. Follow-up microcytic anemia.   Follow-up Information    Schedule an appointment as soon as possible for a visit with [facility] .   Why:  This is the primary care provider (pcp) assigned per [facility] access.  If this is not the preferred pcp contact DSS at [phone]   Contact information:    family practice  [place] [address] [phone]     Discharge Diagnoses:  1. Partial SBO 2. Metastatic colon cancer 3. Microcytic anemia  Discharge Condition: improved Disposition: home  Diet recommendation: regular  Filed Weights   12/21/14 0615  Weight: 90.719 kg (200 lb)    History of present illness:  43yom with metastatic colon cancer admitted for partial SBO vs ileus.  Hospital Course:  David Conrad improved with supportive care and had spontaneously resolution of symptoms. Hospitalization was uncomplicated. Followed by oncologist as an inpatient who will assist with outpatient follow-up. Individual issues as below.  1. Partial SBO. Resolved. 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  2. Metastatic colon cancer patient has significant tumor burden in his abdomen. Plans to go to [facility] to consider surgery and intraperitoneal chemotherapy. 3. Microcytic anemia, stable.  Consultants:  Oncology  Procedures: none  Discharge Instructions  Discharge Instructions    Activity as tolerated - No restrictions    Complete by:  As directed      Diet general    Complete by:  As directed      Discharge instructions    Complete by:  As directed   Call your physician or  seek immediate medical attention for pain, vomiting, no bowel movement or gas or worsening of condition.          Current Discharge Medication List    START taking these medications   Details  ondansetron (ZOFRAN) 4 MG tablet Take 1 tablet (4 mg total) by mouth every 8 (eight) hours as needed for nausea or vomiting. Qty: 30 tablet, Refills: 0      CONTINUE these medications which have NOT CHANGED   Details  clonazePAM (KLONOPIN) 0.5 MG tablet Take 0.5 mg by mouth 3 (three) times daily as needed for anxiety.    DULoxetine (CYMBALTA) 30 MG capsule Take 30 mg by mouth daily.    escitalopram (LEXAPRO) 20 MG tablet Take 20 mg by mouth daily.    morphine (MS CONTIN) 30 MG 12 hr tablet Take 30 mg by mouth every 12 (twelve) hours.    oxyCODONE (OXY IR/ROXICODONE) 5 MG immediate release tablet Take 5 mg by mouth every 4 (four) hours as needed for severe pain.      STOP taking these medications     metoCLOPramide (REGLAN) 10 MG tablet        No Known Allergies  The results of significant diagnostics from this hospitalization (including imaging, microbiology, ancillary and laboratory) are listed below for reference.    Significant Diagnostic Studies: Dg Abd 1 View  12/23/2014   CLINICAL DATA:  Small bowel obstruction. No abdominal pain at this time.  EXAM: ABDOMEN - 1 VIEW  COMPARISON:  12/22/2014  FINDINGS: Nonobstructive bowel gas pattern. No supine evidence of free air. No organomegaly or  suspicious calcification. No acute bony abnormality.  IMPRESSION: No evidence of bowel obstruction currently.   Electronically Signed   By: Rolm Baptise M.D.   On: 12/23/2014 08:59   Ct Abdomen Pelvis W Contrast  12/23/2014   CLINICAL DATA:  Colon cancer diagnosed October 2013 with recurrence in 2015. History of partial colon resection. Chemotherapy completed. Diffuse abdominal pain.  EXAM: CT ABDOMEN AND PELVIS WITH CONTRAST  TECHNIQUE: Multidetector CT imaging of the abdomen and pelvis was performed  using the standard protocol following bolus administration of intravenous contrast.  CONTRAST:  17mL OMNIPAQUE IOHEXOL 300 MG/ML  SOLN  COMPARISON:  Abdominal pelvic CT 12/12/2014.  FINDINGS: Interpretation of this examination was delayed as it was not merged with prior studies. This patient has 4 different medical record numbers per my review of [employer]. Merging of the separate accounts was necessary so that accurate comparison with prior studies could be made.  Lower chest: Linear atelectasis at both lung bases. No significant pleural or pericardial effusion. Central line is in place.  Hepatobiliary: There is a stable small cyst inferiorly in the right hepatic lobe measuring 1.3 cm on image 28, unchanged from older remote studies. There are no definite intrinsic hepatic metastases. However, there is a sub capsular metastasis measuring 2.8 cm on image 27 which is not significantly changed. No evidence of gallstones, gallbladder wall thickening or biliary dilatation.  Pancreas: Unremarkable. No pancreatic ductal dilatation or surrounding inflammatory changes.  Spleen: Normal in size without focal abnormality.  Adrenals/Urinary Tract: Both adrenal glands appear normal.The kidneys appear normal without evidence of urinary tract calculus, suspicious lesion or hydronephrosis. No bladder abnormalities are seen.  Stomach/Bowel: Status post right hemicolectomy. No evidence of bowel obstruction or bowel wall thickening.Again demonstrated is multifocal peritoneal carcinomatosis with multiple peritoneal implants. There are large components within the right abdomen measuring 9.1 x 5.8 cm on image 45 and 7.7 x 5.2 cm on image 58 (previously 9.2 x 5.7 cm and 7.6 x 6.0 cm). Within the left mid abdomen, there is an 8.1 x 5.4 cm lesion on image 52. Multiple other smaller implants are noted, including a 2.9 cm lesion near the umbilicus on image 44. There is a 2.5 cm lesion between the bladder and rectum on image 81. No  generalized ascites. Some of the larger implants demonstrate low-density suggesting partial necrosis.  Vascular/Lymphatic: In addition to the peritoneal implants, there are few mildly enlarged central mesenteric lymph nodes which appear similar. There is no retroperitoneal lymphadenopathy. No significant vascular findings are present.  Reproductive: The prostate gland and seminal vesicles appear normal. There is a probable small right-sided scrotal hydrocele.  Other: Small umbilical hernia congenitally fat.  Musculoskeletal: No acute or significant osseous findings. No osseous metastases identified.  IMPRESSION: 1. No significant change in extensive peritoneal carcinomatosis compared with recent prior examinations. There is subcapsular hepatic lesion and disease within the midline abdominal incision. 2. No evidence of bowel obstruction or abscess. 3. No acute findings.   Electronically Signed   By: Richardean Sale M.D.   On: 12/23/2014 18:38   Dg Abd Acute W/chest  12/21/2014   CLINICAL DATA:  Nausea and vomiting with abdominal distention  EXAM: DG ABDOMEN ACUTE W/ 1V CHEST  COMPARISON:  12/17/2014  FINDINGS: Cardiac shadow is within normal limits. Mild bibasilar atelectasis is seen. A right chest wall port is noted. No focal confluent infiltrate is seen.  Paucity of bowel gas is noted. A few air-fluid levels are noted in the right mid abdomen similar  to that seen on prior exam. Multiple fluid-filled loops of small bowel are noted. No free air is seen. No abnormal mass or abnormal calcifications are noted. Postsurgical changes are noted in right mid abdomen.  IMPRESSION: Multiple fluid-filled filled loops of small bowel with diffuse scattered air-fluid levels. These changes may represent an early small bowel obstruction. Correlation with the physical exam is recommended.   Electronically Signed   By: Inez Catalina M.D.   On: 12/21/2014 07:16    Labs: Basic Metabolic Panel:  Recent Labs Lab 12/21/14 1062  12/21/14 0635 12/22/14 0437 12/23/14 0510  NA 136  --  140 141  K 3.2*  --  3.9 4.4  CL 108  --  106 107  CO2 22  --  25 25  GLUCOSE 140*  --  94 96  BUN 9  --  6 6  CREATININE 0.79  --  0.83 0.95  CALCIUM 8.4*  --  9.1 9.2  MG  --  1.7  --  2.2   Liver Function Tests:  Recent Labs Lab 12/21/14 0633 12/22/14 0437  AST 13* 12*  ALT 22 19  ALKPHOS 76 76  BILITOT 0.2* 0.8  PROT 6.5 6.4*  ALBUMIN 3.3* 3.1*    Recent Labs Lab 12/21/14 0633  LIPASE 29   CBC:  Recent Labs Lab 12/21/14 0633 12/22/14 0437 12/23/14 0510  WBC 5.3 4.4 4.6  NEUTROABS 3.1  --   --   HGB 10.6* 10.4* 10.9*  HCT 33.9* 34.4* 36.8*  MCV 77.6* 78.2 78.8  PLT 268 297 321    Principal Problem:   Partial small bowel obstruction Active Problems:   Abdominal pain   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

## 2014-12-24 NOTE — Progress Notes (Signed)
David Conrad did well yesterday. He had some full liquids. He had no nausea vomiting. He had no abdominal pain. He went to the bathroom. He was ambulating.  He thinks he will be able to go home today.  I looked at his CT scan. I do not see anything that looked different. There is no obstruction.  He would like some Zofran at home. I will make sure that this is on his medication list when he is discharged by the hospitalist.  He's had no bleeding. He's had no cough. He's had no leg swelling.  All of his vital signs look stable. Blood pressure 110/72. Temperature 98.2. His abdomen is soft. He has decent bowel sounds. There is no guarding or rebound tenderness. He has no abdominal mass. There is no fluid wave. Lungs are clear. Cardiac exam regular rate and rhythm.  If he does go home today, I told him to make sure he calls [facility] so that they can get him out there for surgery. When I talked to one of the surgeons, they said that they could see him and be able to coordinate surgery. I will also call [facility] no that he is being discharged today.  He seems to be in much better frame of mind. Hopefully, he will have debulking surgery which, in my mind, is the best way to try to help him.  Frederich Cha 1:12

## 2014-12-24 NOTE — Progress Notes (Signed)
  PROGRESS NOTE  David Conrad MVV:[id number] DOB: [birth year] DOA: 12/21/2014 PCP: PROVIDER NOT IN SYSTEM [facility]  Dr. Marin Olp  Summary: 72yom with metastatic colon cancer admitted for partial SBO vs ileus.  Assessment/Plan: 1. Partial SBO. Resolved. 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   2. Metastatic colon cancer patient has significant tumor burden in his abdomen. Plans to go to [facility] to consider surgery and intraperitoneal chemotherapy. 3. Microcytic anemia, stable. 4. D/c 12/18/2014 ileus versus partial SBO   Improved, PSBO clinically resolved.   Zofran Rx on discharge.  F/u with [facility] so that they can get him out there for surgery  Murray Hodgkins, MD  [facility]  Pager [phone] If 7PM-7AM, please contact night-coverage at [email or web address], password [facility] 12/24/2014, 10:37 AM  LOS: 3 days   Consultants:  Oncology   Procedures:    Antibiotics:    HPI/Subjective: Feeling fine, tolerating diet, no pain, no n/v. Ready to go home.  Objective: Filed Vitals:   12/23/14 0528 12/23/14 1419 12/23/14 2100 12/24/14 0541  BP: 113/79 119/83 115/70 110/72  Pulse: 80 69 71 67  Temp: 98 F (36.7 C) 98.2 F (36.8 C) 98.2 F (36.8 C) 98.2 F (36.8 C)  TempSrc: Oral Oral Oral Oral  Resp: 20 20 16 16   Height:      Weight:      SpO2: 99% 100% 99% 100%    Intake/Output Summary (Last 24 hours) at 12/24/14 1037 Last data filed at 12/24/14 0945  Gross per 24 hour  Intake    620 ml  Output      0 ml  Net    620 ml     Filed Weights   12/21/14 0615  Weight: 90.719 kg (200 lb)    Exam:     Afebrile, VSS. No hypoxia. General:  Appears calm and comfortable Cardiovascular: RRR, no m/r/g.  Respiratory: CTA bilaterally, no w/r/r. Normal respiratory effort. Abdomen: soft, ntnd, normal bowel sounds Psychiatric: grossly normal mood and affect, speech fluent and appropriate  New data  reviewed:  No new data  Pertinent data since admission:  CT abdomen and pelvis, no acute changes. No bowel obstruction.   Scheduled Meds: . DULoxetine  30 mg Oral Daily  . escitalopram  20 mg Oral Daily  . 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

## 2014-12-24 NOTE — Care Management Note (Signed)
Case Management Note  Patient Details  Name: DEVONN GIAMPIETRO MRN: [id number] Date of Birth: 1970/10/30  Subjective/Objective:                    Action/Plan:  D/c home no needs or orders.    Expected Discharge Date:   (UNKNOWN)               Expected Discharge Plan:  Home/Self Care  In-House Referral:     Discharge planning Services  CM Consult  Post Acute Care Choice:    Choice offered to:     DME Arranged:    DME Agency:     HH Arranged:    [place] Agency:     Status of Service:  Completed, signed off  Medicare Important Message Given:    Date Medicare IM Given:  12/24/14 Medicare IM give by:  Dessa Phi Date Additional Medicare IM Given:    Additional Medicare Important Message give by:     If discussed at [place] of Stay Meetings, dates discussed:    Additional Comments:  Dessa Phi, RN 12/24/2014, 12:58 PM

## 2014-12-24 NOTE — Progress Notes (Signed)
Per Dr. Marin Olp spoke with Dr.Shen's nurse at [facility]. Informed her to notify Dr. Crisoforo Oxford that pt was being discharged from [facility] hospital. She will notify Dr. Lyda Jester partner as he is out of town.

## 2015-01-03 ENCOUNTER — Encounter (HOSPITAL_COMMUNITY): Payer: Self-pay

## 2015-01-03 ENCOUNTER — Emergency Department (HOSPITAL_COMMUNITY): Payer: Medicare Other

## 2015-01-03 ENCOUNTER — Emergency Department (HOSPITAL_COMMUNITY)
Admission: EM | Admit: 2015-01-03 | Discharge: 2015-01-03 | Disposition: A | Discharge status: Home / Self Care | Payer: Medicare Other | Attending: Emergency Medicine | Admitting: Emergency Medicine

## 2015-01-03 DIAGNOSIS — R112 Nausea with vomiting, unspecified: Secondary | ICD-10-CM | POA: Diagnosis not present

## 2015-01-03 DIAGNOSIS — Z79899 Other long term (current) drug therapy: Secondary | ICD-10-CM | POA: Insufficient documentation

## 2015-01-03 DIAGNOSIS — R197 Diarrhea, unspecified: Secondary | ICD-10-CM | POA: Insufficient documentation

## 2015-01-03 DIAGNOSIS — Z85038 Personal history of other malignant neoplasm of large intestine: Secondary | ICD-10-CM | POA: Insufficient documentation

## 2015-01-03 DIAGNOSIS — R109 Unspecified abdominal pain: Secondary | ICD-10-CM

## 2015-01-03 DIAGNOSIS — R1084 Generalized abdominal pain: Secondary | ICD-10-CM | POA: Diagnosis present

## 2015-01-03 DIAGNOSIS — Z9889 Other specified postprocedural states: Secondary | ICD-10-CM | POA: Insufficient documentation

## 2015-01-03 LAB — COMPREHENSIVE METABOLIC PANEL
ALBUMIN: 3.7 g/dL (ref 3.5–5.0)
ALT: 15 U/L — ABNORMAL LOW (ref 17–63)
AST: 22 U/L (ref 15–41)
Alkaline Phosphatase: 82 U/L (ref 38–126)
Anion gap: 14 (ref 5–15)
BILIRUBIN TOTAL: 0.4 mg/dL (ref 0.3–1.2)
BUN: 8 mg/dL (ref 6–20)
CALCIUM: 9.4 mg/dL (ref 8.9–10.3)
CO2: 25 mmol/L (ref 22–32)
Chloride: 102 mmol/L (ref 101–111)
Creatinine, Ser: 0.94 mg/dL (ref 0.61–1.24)
GFR calc Af Amer: >60 mL/min (ref >60)
GFR calc non Af Amer: >60 mL/min (ref >60)
Glucose, Bld: 131 mg/dL — ABNORMAL HIGH (ref 65–99)
POTASSIUM: 3.6 mmol/L (ref 3.5–5.1)
SODIUM: 141 mmol/L (ref 135–145)
Total Protein: 7.4 g/dL (ref 6.5–8.1)

## 2015-01-03 LAB — URINALYSIS, ROUTINE W REFLEX MICROSCOPIC
Bilirubin Urine: NEGATIVE
Glucose, UA: NEGATIVE mg/dL
Hgb urine dipstick: NEGATIVE
Ketones, ur: NEGATIVE mg/dL
LEUKOCYTES UA: NEGATIVE
NITRITE: NEGATIVE
PH: 7 (ref 5.0–8.0)
PROTEIN: NEGATIVE mg/dL
Specific Gravity, Urine: 1.017 (ref 1.005–1.030)
Urobilinogen, UA: 0.2 mg/dL (ref 0.0–1.0)

## 2015-01-03 LAB — CBC WITH DIFFERENTIAL/PLATELET
BASOS PCT: 0 % (ref 0–1)
Basophils Absolute: 0 10*3/uL (ref 0.0–0.1)
EOS PCT: 2 % (ref 0–5)
Eosinophils Absolute: 0.1 10*3/uL (ref 0.0–0.7)
HCT: 38.5 % — ABNORMAL LOW (ref 39.0–52.0)
HEMOGLOBIN: 12 g/dL — AB (ref 13.0–17.0)
LYMPHS PCT: 15 % (ref 12–46)
Lymphs Abs: 1.2 10*3/uL (ref 0.7–4.0)
MCH: 24.4 pg — ABNORMAL LOW (ref 26.0–34.0)
MCHC: 31.2 g/dL (ref 30.0–36.0)
MCV: 78.3 fL (ref 78.0–100.0)
MONO ABS: 1 10*3/uL (ref 0.1–1.0)
MONOS PCT: 12 % (ref 3–12)
Neutro Abs: 5.7 10*3/uL (ref 1.7–7.7)
Neutrophils Relative %: 71 % (ref 43–77)
Platelets: 379 10*3/uL (ref 150–400)
RBC: 4.92 MIL/uL (ref 4.22–5.81)
RDW: 18.1 % — ABNORMAL HIGH (ref 11.5–15.5)
WBC: 8 10*3/uL (ref 4.0–10.5)

## 2015-01-03 LAB — LIPASE, BLOOD: LIPASE: 19 U/L — AB (ref 22–51)

## 2015-01-03 MED ORDER — HYDROMORPHONE HCL 1 MG/ML IJ SOLN
1.0000 mg | Freq: Once | INTRAMUSCULAR | Status: AC
  Administered 2015-01-03: 1 mg via INTRAVENOUS
  Filled 2015-01-03: qty 1

## 2015-01-03 MED ORDER — OXYCODONE-ACETAMINOPHEN 5-325 MG PO TABS: 1.0000 | ORAL_TABLET | Freq: Four times a day (QID) | ORAL | Status: AC | PRN

## 2015-01-03 MED ORDER — ONDANSETRON HCL 4 MG/2ML IJ SOLN
4.0000 mg | Freq: Once | INTRAMUSCULAR | Status: AC
  Administered 2015-01-03: 4 mg via INTRAVENOUS
  Filled 2015-01-03: qty 2

## 2015-01-03 MED ORDER — SODIUM CHLORIDE 0.9 % IV BOLUS (SEPSIS)
1000.0000 mL | INTRAVENOUS | Status: AC
  Administered 2015-01-03 (×2): 1000 mL via INTRAVENOUS

## 2015-01-03 NOTE — ED Notes (Signed)
Pt aware of urine sample needed.  Unable to void at this time

## 2015-01-03 NOTE — ED Notes (Signed)
He c/o generalized abd. Pain since this morning. He states he has stage IV colon cancer; and that his oncologist has ceased his chemotherapy treatments.  He states he had a sm. B.m. This morning.

## 2015-01-03 NOTE — ED Provider Notes (Signed)
CSN: [id number]     Arrival date & time 01/03/15  1130 History   First MD Initiated Contact with Patient 01/03/15 1133     Chief Complaint  Patient presents with  . Abdominal Pain  . Colon Cancer     (Consider location/radiation/quality/duration/timing/severity/associated sxs/prior Treatment) Patient is a 44 y.o. male presenting with abdominal pain. The history is provided by the patient.  Abdominal Pain Pain location:  Generalized Pain quality: aching   Pain radiates to:  Does not radiate Pain severity:  Moderate Onset quality:  Gradual Timing:  Constant Progression:  Worsening Chronicity:  Recurrent Context comment:  Colon ca Relieved by:  Nothing Worsened by:  Nothing tried Ineffective treatments:  None tried Associated symptoms: diarrhea, nausea and vomiting   Associated symptoms: no chest pain, no cough, no dysuria, no fever, no hematuria and no shortness of breath     Past Medical History  Diagnosis Date  . Cancer     colon  . Colon cancer    Past Surgical History  Procedure Laterality Date  . Abdominal surgery     Family History  Problem Relation Age of Onset  . Colon cancer Maternal Grandfather    History  Substance Use Topics  . Smoking status: Never Smoker   . Smokeless tobacco: Never Used  . Alcohol Use: No    Review of Systems  Constitutional: Negative for fever.  HENT: Negative for drooling and rhinorrhea.   Eyes: Negative for pain.  Respiratory: Negative for cough and shortness of breath.   Cardiovascular: Negative for chest pain and leg swelling.  Gastrointestinal: Positive for nausea, vomiting, abdominal pain and diarrhea.  Genitourinary: Negative for dysuria and hematuria.  Musculoskeletal: Negative for gait problem and neck pain.  Skin: Negative for color change.  Neurological: Negative for numbness and headaches.  Hematological: Negative for adenopathy.  Psychiatric/Behavioral: Negative for behavioral problems.  All other systems  reviewed and are negative.     Allergies  Review of patient's allergies indicates no known allergies.  Home Medications   Prior to Admission medications   Medication Sig Start Date End Date Taking? Authorizing Provider  clonazePAM (KLONOPIN) 0.5 MG tablet Take 0.5 mg by mouth 3 (three) times daily as needed for anxiety.    Historical Provider, MD  DULoxetine (CYMBALTA) 30 MG capsule Take 30 mg by mouth daily.    Historical Provider, MD  escitalopram (LEXAPRO) 20 MG tablet Take 20 mg by mouth daily.    Historical Provider, MD  morphine (MS CONTIN) 30 MG 12 hr tablet Take 30 mg by mouth every 12 (twelve) hours.    Historical Provider, MD  ondansetron (ZOFRAN) 4 MG tablet Take 1 tablet (4 mg total) by mouth every 8 (eight) hours as needed for nausea or vomiting. 12/24/14   Samuella Cota, MD  oxyCODONE (OXY IR/ROXICODONE) 5 MG immediate release tablet Take 5 mg by mouth every 4 (four) hours as needed for severe pain.    Historical Provider, MD   BP 124/88 mmHg  Pulse 83  Temp(Src) 97.8 F (36.6 C) (Oral)  Resp 24  SpO2 100% Physical Exam  Constitutional: He is oriented to person, place, and time. He appears well-developed and well-nourished. He appears distressed.  HENT:  Head: Normocephalic and atraumatic.  Right Ear: External ear normal.  Left Ear: External ear normal.  Nose: Nose normal.  Mouth/Throat: Oropharynx is clear and moist. No oropharyngeal exudate.  Eyes: Conjunctivae and EOM are normal. Pupils are equal, round, and reactive to light.  Neck:  Normal range of motion. Neck supple.  Cardiovascular: Normal rate, regular rhythm, normal heart sounds and intact distal pulses.  Exam reveals no gallop and no friction rub.   No murmur heard. Pulmonary/Chest: Effort normal and breath sounds normal. No respiratory distress. He has no wheezes.  Abdominal: Soft. Bowel sounds are normal. He exhibits no distension (mild). There is tenderness (diffuse mild ttp). There is no rebound and  no guarding.  Musculoskeletal: Normal range of motion. He exhibits no edema or tenderness.  Neurological: He is alert and oriented to person, place, and time.  Skin: Skin is warm and dry.  Psychiatric: He has a normal mood and affect. His behavior is normal.  Nursing note and vitals reviewed.   ED Course  Procedures (including critical care time) Labs Review Labs Reviewed  CBC WITH DIFFERENTIAL/PLATELET - Abnormal; Notable for the following:    Hemoglobin 12.0 (*)    HCT 38.5 (*)    MCH 24.4 (*)    RDW 18.1 (*)    All other components within normal limits  COMPREHENSIVE METABOLIC PANEL - Abnormal; Notable for the following:    Glucose, Bld 131 (*)    ALT 15 (*)    All other components within normal limits  LIPASE, BLOOD - Abnormal; Notable for the following:    Lipase 19 (*)    All other components within normal limits  URINALYSIS, ROUTINE W REFLEX MICROSCOPIC    Imaging Review Dg Abd 2 Views  01/03/2015   CLINICAL DATA:  Severe abdominal pain and distention with nausea and vomiting. History of colon cancer and peritoneal carcinomatosis.  EXAM: ABDOMEN - 2 VIEW  COMPARISON:  12/23/2014 and prior studies  FINDINGS: A mildly distended gas and fluid-filled portion of mid colon noted.  No other dilated bowel loops are identified.  There is no evidence of pneumoperitoneum.  No suspicious calcifications are identified.  IMPRESSION: Nonspecific nonobstructive bowel gas pattern. No evidence of pneumoperitoneum.   Electronically Signed   By: Margarette Canada M.D.   On: 01/03/2015 13:17     EKG Interpretation None      MDM   Final diagnoses:  Abdominal pain  Nausea and vomiting, vomiting of unspecified type    12:14 PM 44 y.o. male with history of metastatic colon cancer and recent admissions for partial small bowel junction versus ileus who presents with diffuse abdominal pain which began around 1 AM this morning. He has also had nausea, vomiting, and 2 episodes of diarrhea. He states  that he had otherwise been stooling normally. Vital signs unremarkable here. Oncology notes show that he was fairly refractory to treatment. He states that he has an appointment at [facility] on the 20th of this month for second opinion. He denies any fevers. Vital signs unremarkable here. We'll get symptom control, labs, and plain film.  3:45 PM: I interpreted/reviewed the labs and/or imaging which were non-contributory.  Sx controlled in the ED. I discussed options including admission. He preferred to try going home on po pain medications. I think this is reasonable given non-contrib workup thus far. Abd remains benign.  I have discussed the diagnosis/risks/treatment options with the patient and believe the pt to be eligible for discharge home to follow-up with his pcp. We also discussed returning to the ED immediately if new or worsening sx occur. We discussed the sx which are most concerning (e.g., worsening pain, fever, intractable vomiting, worsening diarrhea) that necessitate immediate return. Medications administered to the patient during their visit and any new prescriptions provided  to the patient are listed below.  Medications given during this visit Medications  HYDROmorphone (DILAUDID) injection 1 mg (not administered)  HYDROmorphone (DILAUDID) injection 1 mg (1 mg Intravenous Given 01/03/15 1212)  ondansetron (ZOFRAN) injection 4 mg (4 mg Intravenous Given 01/03/15 1212)  sodium chloride 0.9 % bolus 1,000 mL (1,000 mLs Intravenous New Bag/Given 01/03/15 1353)  HYDROmorphone (DILAUDID) injection 1 mg (1 mg Intravenous Given 01/03/15 1318)  HYDROmorphone (DILAUDID) injection 1 mg (1 mg Intravenous Given 01/03/15 1448)    New Prescriptions   OXYCODONE-ACETAMINOPHEN (PERCOCET) 5-325 MG PER TABLET    Take 1-2 tablets by mouth every 6 (six) hours as needed for moderate pain.     Pamella Pert, MD 01/03/15 [phone]

## 2015-01-03 NOTE — ED Notes (Signed)
Labs drawn

## 2015-01-03 NOTE — ED Notes (Signed)
Normal saline fluids started

## 2015-01-03 NOTE — ED Notes (Signed)
Bed: [id number] Expected date:  Expected time:  Means of arrival:  Comments: Ems

## 2015-01-11 ENCOUNTER — Emergency Department (HOSPITAL_COMMUNITY): Payer: Medicare Other

## 2015-01-11 ENCOUNTER — Encounter (HOSPITAL_COMMUNITY): Payer: Self-pay | Admitting: *Deleted

## 2015-01-11 ENCOUNTER — Inpatient Hospital Stay (HOSPITAL_COMMUNITY)
Admission: EM | Admit: 2015-01-11 | Discharge: 2015-01-13 | DRG: 375 | Disposition: A | Discharge status: Home / Self Care | Payer: Medicare Other | Attending: Internal Medicine | Admitting: Internal Medicine

## 2015-01-11 DIAGNOSIS — C801 Malignant (primary) neoplasm, unspecified: Secondary | ICD-10-CM

## 2015-01-11 DIAGNOSIS — Z933 Colostomy status: Secondary | ICD-10-CM

## 2015-01-11 DIAGNOSIS — R1032 Left lower quadrant pain: Secondary | ICD-10-CM | POA: Diagnosis present

## 2015-01-11 DIAGNOSIS — R109 Unspecified abdominal pain: Secondary | ICD-10-CM | POA: Diagnosis not present

## 2015-01-11 DIAGNOSIS — Z9049 Acquired absence of other specified parts of digestive tract: Secondary | ICD-10-CM | POA: Diagnosis present

## 2015-01-11 DIAGNOSIS — Z79891 Long term (current) use of opiate analgesic: Secondary | ICD-10-CM | POA: Diagnosis not present

## 2015-01-11 DIAGNOSIS — Z885 Allergy status to narcotic agent status: Secondary | ICD-10-CM | POA: Diagnosis not present

## 2015-01-11 DIAGNOSIS — Z800 Family history of malignant neoplasm of digestive organs: Secondary | ICD-10-CM

## 2015-01-11 DIAGNOSIS — C19 Malignant neoplasm of rectosigmoid junction: Secondary | ICD-10-CM | POA: Diagnosis not present

## 2015-01-11 DIAGNOSIS — K5900 Constipation, unspecified: Secondary | ICD-10-CM | POA: Diagnosis present

## 2015-01-11 DIAGNOSIS — R110 Nausea: Secondary | ICD-10-CM | POA: Diagnosis not present

## 2015-01-11 DIAGNOSIS — Z87891 Personal history of nicotine dependence: Secondary | ICD-10-CM | POA: Diagnosis not present

## 2015-01-11 DIAGNOSIS — Z9221 Personal history of antineoplastic chemotherapy: Secondary | ICD-10-CM | POA: Diagnosis not present

## 2015-01-11 DIAGNOSIS — Z79899 Other long term (current) drug therapy: Secondary | ICD-10-CM | POA: Diagnosis not present

## 2015-01-11 DIAGNOSIS — C189 Malignant neoplasm of colon, unspecified: Secondary | ICD-10-CM

## 2015-01-11 DIAGNOSIS — C182 Malignant neoplasm of ascending colon: Secondary | ICD-10-CM | POA: Diagnosis not present

## 2015-01-11 DIAGNOSIS — K769 Liver disease, unspecified: Secondary | ICD-10-CM | POA: Diagnosis not present

## 2015-01-11 DIAGNOSIS — K56609 Unspecified intestinal obstruction, unspecified as to partial versus complete obstruction: Secondary | ICD-10-CM

## 2015-01-11 DIAGNOSIS — E876 Hypokalemia: Secondary | ICD-10-CM | POA: Diagnosis present

## 2015-01-11 DIAGNOSIS — Z7952 Long term (current) use of systemic steroids: Secondary | ICD-10-CM | POA: Diagnosis not present

## 2015-01-11 DIAGNOSIS — D649 Anemia, unspecified: Secondary | ICD-10-CM | POA: Diagnosis present

## 2015-01-11 DIAGNOSIS — C786 Secondary malignant neoplasm of retroperitoneum and peritoneum: Secondary | ICD-10-CM | POA: Diagnosis present

## 2015-01-11 DIAGNOSIS — C799 Secondary malignant neoplasm of unspecified site: Secondary | ICD-10-CM

## 2015-01-11 LAB — CBC WITH DIFFERENTIAL/PLATELET
BASOS ABS: 0 10*3/uL (ref 0.0–0.1)
Basophils Relative: 0 % (ref 0–1)
EOS PCT: 3 % (ref 0–5)
Eosinophils Absolute: 0.2 10*3/uL (ref 0.0–0.7)
HCT: 37.6 % — ABNORMAL LOW (ref 39.0–52.0)
Hemoglobin: 11.5 g/dL — ABNORMAL LOW (ref 13.0–17.0)
Lymphocytes Relative: 22 % (ref 12–46)
Lymphs Abs: 1.6 10*3/uL (ref 0.7–4.0)
MCH: 24.1 pg — ABNORMAL LOW (ref 26.0–34.0)
MCHC: 30.6 g/dL (ref 30.0–36.0)
MCV: 78.8 fL (ref 78.0–100.0)
MONO ABS: 1 10*3/uL (ref 0.1–1.0)
MONOS PCT: 13 % — AB (ref 3–12)
NEUTROS PCT: 62 % (ref 43–77)
Neutro Abs: 4.7 10*3/uL (ref 1.7–7.7)
Platelets: 337 10*3/uL (ref 150–400)
RBC: 4.77 MIL/uL (ref 4.22–5.81)
RDW: 18.4 % — ABNORMAL HIGH (ref 11.5–15.5)
WBC: 7.5 10*3/uL (ref 4.0–10.5)

## 2015-01-11 LAB — COMPREHENSIVE METABOLIC PANEL
ALK PHOS: 76 U/L (ref 38–126)
ALT: 10 U/L — ABNORMAL LOW (ref 17–63)
ANION GAP: 11 (ref 5–15)
AST: 11 U/L — AB (ref 15–41)
Albumin: 3.2 g/dL — ABNORMAL LOW (ref 3.5–5.0)
BILIRUBIN TOTAL: 0.7 mg/dL (ref 0.3–1.2)
BUN: 10 mg/dL (ref 6–20)
CO2: 28 mmol/L (ref 22–32)
CREATININE: 0.75 mg/dL (ref 0.61–1.24)
Calcium: 9.2 mg/dL (ref 8.9–10.3)
Chloride: 102 mmol/L (ref 101–111)
GFR calc Af Amer: >60 mL/min (ref >60)
GFR calc non Af Amer: >60 mL/min (ref >60)
GLUCOSE: 106 mg/dL — AB (ref 65–99)
Potassium: 3.4 mmol/L — ABNORMAL LOW (ref 3.5–5.1)
Sodium: 141 mmol/L (ref 135–145)
TOTAL PROTEIN: 7.1 g/dL (ref 6.5–8.1)

## 2015-01-11 LAB — URINALYSIS, ROUTINE W REFLEX MICROSCOPIC
GLUCOSE, UA: NEGATIVE mg/dL
Hgb urine dipstick: NEGATIVE
Ketones, ur: 40 mg/dL — AB
Leukocytes, UA: NEGATIVE
NITRITE: NEGATIVE
PH: 6 (ref 5.0–8.0)
PROTEIN: NEGATIVE mg/dL
SPECIFIC GRAVITY, URINE: 1.024 (ref 1.005–1.030)
UROBILINOGEN UA: 1 mg/dL (ref 0.0–1.0)

## 2015-01-11 LAB — LIPASE, BLOOD: Lipase: 12 U/L — ABNORMAL LOW (ref 22–51)

## 2015-01-11 MED ORDER — POTASSIUM CHLORIDE CRYS ER 20 MEQ PO TBCR
40.0000 meq | EXTENDED_RELEASE_TABLET | Freq: Once | ORAL | Status: AC
  Administered 2015-01-11: 40 meq via ORAL
  Filled 2015-01-11: qty 2

## 2015-01-11 MED ORDER — ALPRAZOLAM 0.5 MG PO TABS: 0.5000 mg | ORAL_TABLET | Freq: Three times a day (TID) | ORAL | Status: DC | PRN

## 2015-01-11 MED ORDER — ACETAMINOPHEN 325 MG PO TABS: 650.0000 mg | ORAL_TABLET | Freq: Four times a day (QID) | ORAL | Status: DC | PRN

## 2015-01-11 MED ORDER — LACTULOSE 10 GM/15ML PO SOLN: 20.0000 g | Freq: Every day | ORAL | Status: DC | PRN

## 2015-01-11 MED ORDER — HYDROMORPHONE HCL 1 MG/ML IJ SOLN
1.0000 mg | Freq: Once | INTRAMUSCULAR | Status: AC
  Administered 2015-01-11: 1 mg via INTRAVENOUS
  Filled 2015-01-11: qty 1

## 2015-01-11 MED ORDER — DRONABINOL 5 MG PO CAPS
5.0000 mg | ORAL_CAPSULE | Freq: Two times a day (BID) | ORAL | Status: DC
  Administered 2015-01-11 – 2015-01-13 (×3): 5 mg via ORAL
  Filled 2015-01-11 (×3): qty 1

## 2015-01-11 MED ORDER — POLYETHYLENE GLYCOL 3350 17 G PO PACK
17.0000 g | PACK | Freq: Every day | ORAL | Status: DC
  Administered 2015-01-11: 17 g via ORAL
  Filled 2015-01-11 (×2): qty 1

## 2015-01-11 MED ORDER — IOHEXOL 300 MG/ML  SOLN
50.0000 mL | Freq: Once | INTRAMUSCULAR | Status: AC | PRN
  Administered 2015-01-11: 50 mL via ORAL

## 2015-01-11 MED ORDER — DULOXETINE HCL 30 MG PO CPEP
30.0000 mg | ORAL_CAPSULE | Freq: Every day | ORAL | Status: DC
  Administered 2015-01-11 – 2015-01-13 (×3): 30 mg via ORAL
  Filled 2015-01-11 (×3): qty 1

## 2015-01-11 MED ORDER — ONDANSETRON HCL 4 MG/2ML IJ SOLN
4.0000 mg | Freq: Three times a day (TID) | INTRAMUSCULAR | Status: AC | PRN
  Administered 2015-01-11: 4 mg via INTRAVENOUS
  Filled 2015-01-11: qty 2

## 2015-01-11 MED ORDER — SODIUM CHLORIDE 0.9 % IV SOLN
INTRAVENOUS | Status: DC
  Administered 2015-01-11 – 2015-01-12 (×2): via INTRAVENOUS

## 2015-01-11 MED ORDER — ACETAMINOPHEN 650 MG RE SUPP: 650.0000 mg | Freq: Four times a day (QID) | RECTAL | Status: DC | PRN

## 2015-01-11 MED ORDER — SODIUM CHLORIDE 0.9 % IV BOLUS (SEPSIS)
1000.0000 mL | Freq: Once | INTRAVENOUS | Status: AC
  Administered 2015-01-11: 1000 mL via INTRAVENOUS

## 2015-01-11 MED ORDER — HYDROMORPHONE HCL 2 MG/ML IJ SOLN
2.0000 mg | INTRAMUSCULAR | Status: AC | PRN
  Administered 2015-01-11 (×2): 2 mg via INTRAVENOUS
  Filled 2015-01-11 (×2): qty 1

## 2015-01-11 MED ORDER — ENOXAPARIN SODIUM 40 MG/0.4ML ~~LOC~~ SOLN
40.0000 mg | SUBCUTANEOUS | Status: DC
  Administered 2015-01-11 – 2015-01-12 (×2): 40 mg via SUBCUTANEOUS
  Filled 2015-01-11 (×3): qty 0.4

## 2015-01-11 MED ORDER — MORPHINE SULFATE ER 30 MG PO TBCR
30.0000 mg | EXTENDED_RELEASE_TABLET | Freq: Two times a day (BID) | ORAL | Status: DC
  Administered 2015-01-11 – 2015-01-12 (×3): 30 mg via ORAL
  Filled 2015-01-11 (×3): qty 1

## 2015-01-11 MED ORDER — IOHEXOL 300 MG/ML  SOLN
100.0000 mL | Freq: Once | INTRAMUSCULAR | Status: AC | PRN
  Administered 2015-01-11: 100 mL via INTRAVENOUS

## 2015-01-11 MED ORDER — ESCITALOPRAM OXALATE 20 MG PO TABS
20.0000 mg | ORAL_TABLET | Freq: Every day | ORAL | Status: DC
  Administered 2015-01-11 – 2015-01-13 (×3): 20 mg via ORAL
  Filled 2015-01-11 (×3): qty 1

## 2015-01-11 MED ORDER — CLONAZEPAM 0.5 MG PO TABS: 0.5000 mg | ORAL_TABLET | Freq: Three times a day (TID) | ORAL | Status: DC | PRN

## 2015-01-11 MED ORDER — HYDROMORPHONE HCL 1 MG/ML IJ SOLN
0.5000 mg | INTRAMUSCULAR | Status: DC | PRN
  Administered 2015-01-11: 0.5 mg via INTRAVENOUS
  Filled 2015-01-11: qty 1

## 2015-01-11 MED ORDER — SODIUM CHLORIDE 0.9 % IV SOLN
INTRAVENOUS | Status: DC
  Administered 2015-01-11: 15:00:00 via INTRAVENOUS

## 2015-01-11 NOTE — ED Notes (Signed)
Patient is unable to urinate at this time, fluids are being give so I will come back in 30

## 2015-01-11 NOTE — Progress Notes (Signed)
Pcp is [facility] [place], [place] [id number] [phone] EPIC updated

## 2015-01-11 NOTE — ED Notes (Signed)
Patient presents to the ED from home where he lives with his family with complaints of LLQ pain for 2 days.  Patient states laying flat or in a recumbent position and movement makes pain worse and standing up straight makes pain better.  Patient states pain is so severe it prevents him from sleeping.  Patient rates pain 10/10.  On exam, patients lung sounds are clear in all lobes.  Heart sounds WNL S1/S2, no rub, murmur or gallop. +3 radial and pedal pulses.  No pre-tibial and pedal edema.  Patient's abdomen is soft and tender to palpation.  Bowel sounds hyperactive.  CNIII intact, EOMI.  Skin warm and dry.

## 2015-01-11 NOTE — H&P (Signed)
PCP:   PROVIDER NOT IN SYSTEM   Chief Complaint:  Abdominal pain  HPI: 44 year old male who   has a past medical history of Anemia; Cancer (10/13 and 2015); and History of colon resection. *Today presents to the hospital with chief complaint of "coating abdominal pain. Patient says that he was started on oxycodone which causes constipation though he was taking MiraLAX and lactulose had a bowel movement yesterday but started having abdominal pain which she rates 10/10 in intensity and has improved after he received Dilaudid in the ED. CT scan of the abdomen was done which shows peritoneal carcinomatosis. Patient is followed by Dr. Marin Olp from [place] who had recommended the patient to go to [facility] for surgery. Patient does not want to go to [facility] and says he wants to get treatment over here. Patient denies nausea vomiting or diarrhea. No chest pain or shortness of breath. No fever no dysuria urgency frequency of urination.  Allergies:   Allergies  Allergen Reactions  . Morphine Other (See Comments)    Other reaction(s): Dizziness (intolerance)      Past Medical History  Diagnosis Date  . Anemia   . Cancer 10/13 and 2015    colon ca  . History of colon resection     Past Surgical History  Procedure Laterality Date  . Colonoscopy  06/17/2012    Procedure: COLONOSCOPY;  Surgeon: Juanita Craver, MD;  Location[facility] ENDOSCOPY;  Service: Endoscopy;  Laterality: N/A;  . Colon resection  06/19/2012    Procedure: COLON RESECTION LAPAROSCOPIC;  Surgeon: Ralene Ok, MD;  Location: [place];  Service: General;  Laterality: Right;  . Colostomy revision  06/19/2012    Procedure: COLON RESECTION RIGHT;  Surgeon: Ralene Ok, MD;  Location: [place];  Service: General;  Laterality: Right;    Prior to Admission medications   Medication Sig Start Date End Date Taking? Authorizing Provider  clonazePAM (KLONOPIN) 0.5 MG tablet Take 1 tablet (0.5 mg total) by mouth 3 (three) times  daily as needed for anxiety. 12/18/14  Yes Albertine Patricia, MD  dexamethasone (DECADRON) 4 MG tablet Take 4 mg by mouth 2 (two) times daily with a meal. Start on the day after chemo for 3 days   Yes Historical Provider, MD  DM-Doxylamine-Acetaminophen 15-6.25-325 MG/15ML LIQD Take 30 mLs by mouth every other day as needed (for cold).   Yes Historical Provider, MD  dronabinol (MARINOL) 5 MG capsule Take 1 capsule (5 mg total) by mouth 2 (two) times daily before lunch and supper. 12/18/14  Yes Albertine Patricia, MD  DULoxetine (CYMBALTA) 30 MG capsule Take 1 capsule (30 mg total) by mouth daily. 12/18/14  Yes Silver Huguenin Elgergawy, MD  escitalopram (LEXAPRO) 20 MG tablet Take 20 mg by mouth daily. 01/06/15  Yes Historical Provider, MD  lidocaine-prilocaine (EMLA) cream Apply topically as needed. Patient taking differently: Apply 1 application topically as needed.  01/21/14  Yes Volanda Napoleon, MD  morphine (MS CONTIN) 30 MG 12 hr tablet Take 30 mg by mouth 2 (two) times daily. 11/20/14  Yes Historical Provider, MD  ondansetron (ZOFRAN) 8 MG tablet Take by mouth every 8 (eight) hours as needed for nausea or vomiting.   Yes Historical Provider, MD  oxyCODONE (OXY IR/ROXICODONE) 5 MG immediate release tablet Take 5 mg by mouth every 4 (four) hours as needed. for pain 11/20/14  Yes Historical Provider, MD  oxyCODONE-acetaminophen (PERCOCET/ROXICET) 5-325 MG per tablet Take 1 tablet by mouth every 6 (six) hours as  needed for severe pain.   Yes Historical Provider, MD  polyethylene glycol (MIRALAX / GLYCOLAX) packet Take 17 g by mouth daily. 10/13/14  Yes Charlynne Cousins, MD  [place]   Yes Historical Provider, MD  Pyridoxine HCl (VITAMIN B-6) 250 MG tablet Take 1 tablet (250 mg total) by mouth daily. 09/23/14  Yes Volanda Napoleon, MD  lactulose (CHRONULAC) 10 GM/15ML solution Take 30 mLs (20 g total) by mouth daily as needed for mild constipation. Patient not taking: Reported on  01/11/2015 10/13/14   Charlynne Cousins, MD  loperamide (IMODIUM A-D) 2 MG tablet Take 2 at onset of diarrhea, then 1 every 2hrs until 12hr without a BM. May take 2 tab every 4hrs at bedtime. If diarrhea recurs repeat. Patient not taking: Reported on 01/11/2015 11/02/14   Volanda Napoleon, MD  LORazepam (ATIVAN) 0.5 MG tablet Take 0.5 mg by mouth at bedtime.    Historical Provider, MD  metoCLOPramide (REGLAN) 10 MG tablet Take 1 tablet (10 mg total) by mouth 4 (four) times daily -  before meals and at bedtime. Patient not taking: Reported on 01/11/2015 12/18/14   Albertine Patricia, MD    Social History:  reports that he quit smoking about 17 months ago. His smoking use included Cigarettes. He started smoking about 17 years ago. He has a 7.5 pack-year smoking history. He has never used smokeless tobacco. He reports that he does not drink alcohol or use illicit drugs.  Family History  Problem Relation Age of Onset  . Alcohol abuse Father   . Colon cancer Maternal Grandfather      All the positives are listed in BOLD  Review of Systems:  HEENT: Headache, blurred vision, runny nose, sore throat Neck: Hypothyroidism, hyperthyroidism,,lymphadenopathy Chest : Shortness of breath, history of COPD, Asthma Heart : Chest pain, history of coronary arterey disease GI:  Nausea, vomiting, diarrhea, constipation, GERD GU: Dysuria, urgency, frequency of urination, hematuria Neuro: Stroke, seizures, syncope Psych: Depression, anxiety, hallucinations   Physical Exam: Blood pressure 130/92, pulse 86, temperature 97.7 F (36.5 C), temperature source Oral, resp. rate 18, SpO2 100 %. Constitutional:   Patient is a well-developed and well-nourished male in no acute distress and cooperative with exam. Head: Normocephalic and atraumatic Mouth: Mucus membranes moist Eyes: PERRL, EOMI, conjunctivae normal Neck: Supple, No Thyromegaly Cardiovascular: RRR, S1 normal, S2 normal Pulmonary/Chest: CTAB, no  wheezes, rales, or rhonchi Abdominal: Soft, Tenderness in LLQ on palpation, non-distended, bowel sounds are normal, no masses, organomegaly, or guarding present.  Neurological: A&O x3, Strength is normal and symmetric bilaterally, cranial nerve II-XII are grossly intact, no focal motor deficit, sensory intact to light touch bilaterally.  Extremities : No Cyanosis, Clubbing or Edema  Labs on Admission:  Basic Metabolic Panel:  Recent Labs Lab 01/11/15 1118  NA 141  K 3.4*  CL 102  CO2 28  GLUCOSE 106*  BUN 10  CREATININE 0.75  CALCIUM 9.2   Liver Function Tests:  Recent Labs Lab 01/11/15 1118  AST 11*  ALT 10*  ALKPHOS 76  BILITOT 0.7  PROT 7.1  ALBUMIN 3.2*    Recent Labs Lab 01/11/15 1118  LIPASE 12*   No results for input(s): AMMONIA in the last 168 hours. CBC:  Recent Labs Lab 01/11/15 1118  WBC 7.5  NEUTROABS 4.7  HGB 11.5*  HCT 37.6*  MCV 78.8  PLT 337   Radiological Exams on Admission: Ct Abdomen Pelvis W Contrast  01/11/2015  CLINICAL DATA:  Acute left lower quadrant abdominal pain. Current history of colon cancer.  EXAM: CT ABDOMEN AND PELVIS WITH CONTRAST  TECHNIQUE: Multidetector CT imaging of the abdomen and pelvis was performed using the standard protocol following bolus administration of intravenous contrast.  CONTRAST:  26mL OMNIPAQUE IOHEXOL 300 MG/ML SOLN, 169mL OMNIPAQUE IOHEXOL 300 MG/ML SOLN  COMPARISON:  None available currently.  FINDINGS: Visualized lung bases appear normal. No significant osseous abnormality is noted.  No gallstones are noted. The spleen and pancreas appear normal. 1.6 cm cyst is noted in posterior segment of right hepatic lobe. 28 x 16 mm subcapsular abnormality is noted peripherally in right hepatic lobe with Hounsfield measurement of 40 concerning for metastatic disease. Adrenal glands and kidneys appear normal. No hydronephrosis or renal obstruction is noted. Multiple complex masses are noted throughout the abdomen  consistent with peritoneal carcinomatosis. The largest lesion measures 8.3 x 8.1 x 6.9 cm in the right side of the abdomen another lesion measuring 9.1 x 8.1 x 5.1 cm is seen in the left lower quadrant of the abdomen. Urinary bladder is decompressed. Mild fat containing right inguinal hernia is noted. There is no evidence of bowel obstruction.  IMPRESSION: Multiple large masses are within peritoneal space consistent with peritoneal carcinomatosis.  2.8 x 1.6 subcapsular abnormality is seen peripherally in right hepatic lobe concerning for metastatic disease is well.   Electronically Signed   By: Marijo Conception, M.D.   On: 01/11/2015 13:36      Assessment/Plan Active Problems:   Cancer of colon   Cancer of peritoneum/retroperitoneum, secondary   Abdominal pain, left lower quadrant  Abdominal pain Patient presenting with abdominal pain, CT scan of the abdomen pelvis shows peritoneal carcinomatosis. Will admit the patient for pain control. Start Dilaudid 0.5 May grams every 4 hours when necessary. Zofran when necessary for nausea vomiting.  Peritoneal carcinomatosis/colon cancer Patient is followed by oncology as outpatient. I called and discussed with patient's oncologist Dr. Burney Gauze who will follow the patient in a.m.  Constipation Continue MiraLAX and lactulose when necessary.  Hypokalemia Replace potassium and check BMP in a.m.  DVT prophylaxis Lovenox  Code status: Full code  Family discussion: Admission, patients condition and plan of care including tests being ordered have been discussed with the patient and his mother at bedside who indicate understanding and agree with the plan and Code Status.   Time Spent on Admission: 55 minutes  [facility] Pager: [phone] 01/11/2015, 3:18 PM  If 7PM-7AM, please contact night-coverage  [email or web address]  Password [email or web address]

## 2015-01-11 NOTE — ED Notes (Signed)
Patient comes from home where he lives with his family with c/o LLQ pain x2 days.  Patient is currently undergoing treatment for colon cancer and has Percocet at home.  He has taken that for the LLQ pain with no relief.  Patient's last chemo was in April 2016.  Patient denies N/V/D and fever.  Patient c/o constipation and states his last BM was yesterday (5/22).

## 2015-01-11 NOTE — ED Provider Notes (Signed)
CSN: [id number]     Arrival date & time 01/11/15  1037 History   First MD Initiated Contact with Patient 01/11/15 1044     Chief Complaint  Patient presents with  . Abdominal Pain     (Consider location/radiation/quality/duration/timing/severity/associated sxs/prior Treatment) HPI Comments: 44 year old male with history of tobacco abuse, stage IV colon cancer, small bowel obstruction presents with worsening left lower quadrant abdominal pain for the past 24 hours. No vomiting, no blood in the stools, passing gas. This is different than his normal right-sided abdominal pain related to his cancer. Pain is severe. Nothing is improved his pain tried oral pain meds at home. No focal radiation.  Patient is a 44 y.o. male presenting with abdominal pain. The history is provided by the patient.  Abdominal Pain Associated symptoms: nausea   Associated symptoms: no chest pain, no chills, no dysuria, no fever, no shortness of breath and no vomiting     Past Medical History  Diagnosis Date  . Anemia   . Cancer 10/13 and 2015    colon ca  . History of colon resection    Past Surgical History  Procedure Laterality Date  . Colonoscopy  06/17/2012    Procedure: COLONOSCOPY;  Surgeon: Juanita Craver, MD;  Location: [facility] ENDOSCOPY;  Service: Endoscopy;  Laterality: N/A;  . Colon resection  06/19/2012    Procedure: COLON RESECTION LAPAROSCOPIC;  Surgeon: Ralene Ok, MD;  Location: [place];  Service: General;  Laterality: Right;  . Colostomy revision  06/19/2012    Procedure: COLON RESECTION RIGHT;  Surgeon: Ralene Ok, MD;  Location: [place];  Service: General;  Laterality: Right;   Family History  Problem Relation Age of Onset  . Alcohol abuse Father   . Colon cancer Maternal Grandfather    History  Substance Use Topics  . Smoking status: Former Smoker -- 0.50 packs/day for 15 years    Types: Cigarettes    Start date: 10/01/1997    Quit date: 07/31/2013  . Smokeless tobacco: Never Used    Comment: quit 2014   . Alcohol Use: No    Review of Systems  Constitutional: Negative for fever and chills.  HENT: Negative for congestion.   Eyes: Negative for visual disturbance.  Respiratory: Negative for shortness of breath.   Cardiovascular: Negative for chest pain.  Gastrointestinal: Positive for nausea and abdominal pain. Negative for vomiting.  Genitourinary: Negative for dysuria and flank pain.  Musculoskeletal: Negative for back pain, neck pain and neck stiffness.  Skin: Negative for rash.  Neurological: Negative for light-headedness and headaches.      Allergies  Morphine  Home Medications   Prior to Admission medications   Medication Sig Start Date End Date Taking? Authorizing Provider  clonazePAM (KLONOPIN) 0.5 MG tablet Take 1 tablet (0.5 mg total) by mouth 3 (three) times daily as needed for anxiety. 12/18/14  Yes Albertine Patricia, MD  dexamethasone (DECADRON) 4 MG tablet Take 4 mg by mouth 2 (two) times daily with a meal. Start on the day after chemo for 3 days   Yes Historical Provider, MD  DM-Doxylamine-Acetaminophen 15-6.25-325 MG/15ML LIQD Take 30 mLs by mouth every other day as needed (for cold).   Yes Historical Provider, MD  dronabinol (MARINOL) 5 MG capsule Take 1 capsule (5 mg total) by mouth 2 (two) times daily before lunch and supper. 12/18/14  Yes Albertine Patricia, MD  DULoxetine (CYMBALTA) 30 MG capsule Take 1 capsule (30 mg total) by mouth daily. 12/18/14  Yes Dawood S  Elgergawy, MD  escitalopram (LEXAPRO) 20 MG tablet Take 20 mg by mouth daily. 01/06/15  Yes Historical Provider, MD  lidocaine-prilocaine (EMLA) cream Apply topically as needed. Patient taking differently: Apply 1 application topically as needed.  01/21/14  Yes Volanda Napoleon, MD  morphine (MS CONTIN) 30 MG 12 hr tablet Take 30 mg by mouth 2 (two) times daily. 11/20/14  Yes Historical Provider, MD  ondansetron (ZOFRAN) 8 MG tablet Take by mouth every 8 (eight) hours as needed for nausea or  vomiting.   Yes Historical Provider, MD  oxyCODONE (OXY IR/ROXICODONE) 5 MG immediate release tablet Take 5 mg by mouth every 4 (four) hours as needed. for pain 11/20/14  Yes Historical Provider, MD  oxyCODONE-acetaminophen (PERCOCET/ROXICET) 5-325 MG per tablet Take 1 tablet by mouth every 6 (six) hours as needed for severe pain.   Yes Historical Provider, MD  polyethylene glycol (MIRALAX / GLYCOLAX) packet Take 17 g by mouth daily. 10/13/14  Yes Charlynne Cousins, MD  [place]   Yes Historical Provider, MD  Pyridoxine HCl (VITAMIN B-6) 250 MG tablet Take 1 tablet (250 mg total) by mouth daily. 09/23/14  Yes Volanda Napoleon, MD  lactulose (CHRONULAC) 10 GM/15ML solution Take 30 mLs (20 g total) by mouth daily as needed for mild constipation. Patient not taking: Reported on 01/11/2015 10/13/14   Charlynne Cousins, MD  loperamide (IMODIUM A-D) 2 MG tablet Take 2 at onset of diarrhea, then 1 every 2hrs until 12hr without a BM. May take 2 tab every 4hrs at bedtime. If diarrhea recurs repeat. Patient not taking: Reported on 01/11/2015 11/02/14   Volanda Napoleon, MD  LORazepam (ATIVAN) 0.5 MG tablet Take 0.5 mg by mouth at bedtime.    Historical Provider, MD  metoCLOPramide (REGLAN) 10 MG tablet Take 1 tablet (10 mg total) by mouth 4 (four) times daily -  before meals and at bedtime. Patient not taking: Reported on 01/11/2015 12/18/14   Silver Huguenin Elgergawy, MD   BP 130/92 mmHg  Pulse 86  Temp(Src) 97.7 F (36.5 C) (Oral)  Resp 18  SpO2 100% Physical Exam  Constitutional: He is oriented to person, place, and time. He appears well-developed and well-nourished.  HENT:  Head: Normocephalic and atraumatic.  Mild dry mucous membranes  Eyes: Conjunctivae are normal. Right eye exhibits no discharge. Left eye exhibits no discharge.  Neck: Normal range of motion. Neck supple. No tracheal deviation present.  Cardiovascular: Normal rate and regular rhythm.   Pulmonary/Chest: Effort  normal and breath sounds normal.  Abdominal: Soft. He exhibits no distension. There is tenderness (moderate tenderness and fullness left lower abdomen). There is no guarding.  Musculoskeletal: He exhibits no edema.  Neurological: He is alert and oriented to person, place, and time.  Skin: Skin is warm. No rash noted.  Psychiatric: He has a normal mood and affect.  Nursing note and vitals reviewed.   ED Course  Procedures (including critical care time) Labs Review Labs Reviewed  CBC WITH DIFFERENTIAL/PLATELET - Abnormal; Notable for the following:    Hemoglobin 11.5 (*)    HCT 37.6 (*)    MCH 24.1 (*)    RDW 18.4 (*)    Monocytes Relative 13 (*)    All other components within normal limits  COMPREHENSIVE METABOLIC PANEL - Abnormal; Notable for the following:    Potassium 3.4 (*)    Glucose, Bld 106 (*)    Albumin 3.2 (*)    AST 11 (*)  ALT 10 (*)    All other components within normal limits  LIPASE, BLOOD - Abnormal; Notable for the following:    Lipase 12 (*)    All other components within normal limits  URINALYSIS, ROUTINE W REFLEX MICROSCOPIC - Abnormal; Notable for the following:    Bilirubin Urine SMALL (*)    Ketones, ur 40 (*)    All other components within normal limits    Imaging Review Ct Abdomen Pelvis W Contrast  01/11/2015   CLINICAL DATA:  Acute left lower quadrant abdominal pain. Current history of colon cancer.  EXAM: CT ABDOMEN AND PELVIS WITH CONTRAST  TECHNIQUE: Multidetector CT imaging of the abdomen and pelvis was performed using the standard protocol following bolus administration of intravenous contrast.  CONTRAST:  18mL OMNIPAQUE IOHEXOL 300 MG/ML SOLN, 164mL OMNIPAQUE IOHEXOL 300 MG/ML SOLN  COMPARISON:  None available currently.  FINDINGS: Visualized lung bases appear normal. No significant osseous abnormality is noted.  No gallstones are noted. The spleen and pancreas appear normal. 1.6 cm cyst is noted in posterior segment of right hepatic lobe. 28 x  16 mm subcapsular abnormality is noted peripherally in right hepatic lobe with Hounsfield measurement of 40 concerning for metastatic disease. Adrenal glands and kidneys appear normal. No hydronephrosis or renal obstruction is noted. Multiple complex masses are noted throughout the abdomen consistent with peritoneal carcinomatosis. The largest lesion measures 8.3 x 8.1 x 6.9 cm in the right side of the abdomen another lesion measuring 9.1 x 8.1 x 5.1 cm is seen in the left lower quadrant of the abdomen. Urinary bladder is decompressed. Mild fat containing right inguinal hernia is noted. There is no evidence of bowel obstruction.  IMPRESSION: Multiple large masses are within peritoneal space consistent with peritoneal carcinomatosis.  2.8 x 1.6 subcapsular abnormality is seen peripherally in right hepatic lobe concerning for metastatic disease is well.   Electronically Signed   By: Marijo Conception, M.D.   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 Plan for pain meds blood work and CT scan for further evaluation.  Patient requiring multiple pain meds IV in the ER. CT scan shows peritoneal carcinomatosis and worsening metastasis. Plan for admission for pain control and further evaluation spoke tried hospitalist.  The patients results and plan were reviewed and discussed.   Any x-rays performed were personally reviewed by myself.   Differential diagnosis were considered with the presenting HPI.  Medications  HYDROmorphone (DILAUDID) injection 1 mg (not administered)  HYDROmorphone (DILAUDID) injection 0.5 mg (not administered)  ondansetron (ZOFRAN) injection 4 mg (not administered)  0.9 %  sodium chloride infusion  (not administered)  HYDROmorphone (DILAUDID) injection 1 mg (1 mg Intravenous Given 01/11/15 1122)  sodium chloride 0.9 % bolus 1,000 mL (1,000 mLs Intravenous New Bag/Given 01/11/15 1120)  iohexol (OMNIPAQUE) 300 MG/ML solution 50 mL (50 mLs Oral Contrast Given 01/11/15 1235)  HYDROmorphone (DILAUDID) injection 1 mg (1 mg Intravenous Given 01/11/15 1210)  iohexol (OMNIPAQUE) 300 MG/ML solution 100 mL (100 mLs Intravenous Contrast Given 01/11/15 1238)    Filed Vitals:   01/11/15 1051 01/11/15 1327  BP: 128/84 130/92  Pulse: 104 86  Temp: 97.7 F (36.5 C)   TempSrc: Oral   Resp: 20  18  SpO2: 99% 100%    Final diagnoses:  Carcinomatosis peritonei  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       Elnora Morrison, MD 01/11/15 1420

## 2015-01-12 DIAGNOSIS — C182 Malignant neoplasm of ascending colon: Secondary | ICD-10-CM

## 2015-01-12 DIAGNOSIS — K769 Liver disease, unspecified: Secondary | ICD-10-CM

## 2015-01-12 DIAGNOSIS — R110 Nausea: Secondary | ICD-10-CM

## 2015-01-12 DIAGNOSIS — R109 Unspecified abdominal pain: Secondary | ICD-10-CM

## 2015-01-12 LAB — CBC
HEMATOCRIT: 35 % — AB (ref 39.0–52.0)
Hemoglobin: 10.3 g/dL — ABNORMAL LOW (ref 13.0–17.0)
MCH: 23.2 pg — ABNORMAL LOW (ref 26.0–34.0)
MCHC: 29.4 g/dL — ABNORMAL LOW (ref 30.0–36.0)
MCV: 78.8 fL (ref 78.0–100.0)
PLATELETS: 296 10*3/uL (ref 150–400)
RBC: 4.44 MIL/uL (ref 4.22–5.81)
RDW: 18 % — AB (ref 11.5–15.5)
WBC: 6.8 10*3/uL (ref 4.0–10.5)

## 2015-01-12 LAB — COMPREHENSIVE METABOLIC PANEL
ALK PHOS: 71 U/L (ref 38–126)
ALT: 10 U/L — ABNORMAL LOW (ref 17–63)
AST: 10 U/L — ABNORMAL LOW (ref 15–41)
Albumin: 2.9 g/dL — ABNORMAL LOW (ref 3.5–5.0)
Anion gap: 8 (ref 5–15)
BUN: 7 mg/dL (ref 6–20)
CALCIUM: 8.7 mg/dL — AB (ref 8.9–10.3)
CHLORIDE: 101 mmol/L (ref 101–111)
CO2: 29 mmol/L (ref 22–32)
CREATININE: 0.73 mg/dL (ref 0.61–1.24)
GFR calc non Af Amer: >60 mL/min (ref >60)
Glucose, Bld: 108 mg/dL — ABNORMAL HIGH (ref 65–99)
Potassium: 3.9 mmol/L (ref 3.5–5.1)
Sodium: 138 mmol/L (ref 135–145)
Total Bilirubin: 0.4 mg/dL (ref 0.3–1.2)
Total Protein: 6.4 g/dL — ABNORMAL LOW (ref 6.5–8.1)

## 2015-01-12 MED ORDER — SENNA 8.6 MG PO TABS
2.0000 | ORAL_TABLET | Freq: Every day | ORAL | Status: DC
  Administered 2015-01-13: 17.2 mg via ORAL
  Filled 2015-01-12: qty 2

## 2015-01-12 MED ORDER — ONDANSETRON HCL 4 MG/2ML IJ SOLN
4.0000 mg | Freq: Four times a day (QID) | INTRAMUSCULAR | Status: DC | PRN
  Administered 2015-01-12: 4 mg via INTRAVENOUS
  Filled 2015-01-12: qty 2

## 2015-01-12 MED ORDER — PROCHLORPERAZINE EDISYLATE 5 MG/ML IJ SOLN
10.0000 mg | Freq: Four times a day (QID) | INTRAMUSCULAR | Status: DC | PRN
  Filled 2015-01-12: qty 2

## 2015-01-12 MED ORDER — ONDANSETRON HCL 4 MG/2ML IJ SOLN
4.0000 mg | Freq: Three times a day (TID) | INTRAMUSCULAR | Status: DC | PRN
  Administered 2015-01-12: 4 mg via INTRAVENOUS
  Filled 2015-01-12: qty 2

## 2015-01-12 MED ORDER — HYDROMORPHONE HCL 2 MG/ML IJ SOLN
2.0000 mg | INTRAMUSCULAR | Status: DC | PRN
  Administered 2015-01-12 – 2015-01-13 (×2): 2 mg via INTRAVENOUS
  Filled 2015-01-12 (×2): qty 1

## 2015-01-12 MED ORDER — SODIUM CHLORIDE 0.9 % IV SOLN
8.0000 mg | Freq: Four times a day (QID) | INTRAVENOUS | Status: DC | PRN
  Filled 2015-01-12: qty 4

## 2015-01-12 MED ORDER — SODIUM CHLORIDE 0.9 % IV SOLN
INTRAVENOUS | Status: AC
  Administered 2015-01-12 – 2015-01-13 (×2): via INTRAVENOUS

## 2015-01-12 MED ORDER — PROMETHAZINE HCL 25 MG/ML IJ SOLN
12.5000 mg | Freq: Four times a day (QID) | INTRAMUSCULAR | Status: DC | PRN
  Administered 2015-01-12: 12.5 mg via INTRAVENOUS
  Filled 2015-01-12: qty 1

## 2015-01-12 MED ORDER — POLYETHYLENE GLYCOL 3350 17 G PO PACK
17.0000 g | PACK | Freq: Two times a day (BID) | ORAL | Status: DC
  Administered 2015-01-12 – 2015-01-13 (×3): 17 g via ORAL
  Filled 2015-01-12 (×3): qty 1

## 2015-01-12 NOTE — Consult Note (Signed)
Referral MD  Reason for Referral: Metastatic colorectal cancer   Chief Complaint  Patient presents with  . Abdominal Pain  : My abdomen hurts and I have had nausea.  HPI: Mr. Gover is well-known to me. He is a 44 year old African-American gentleman with metastatic colon cancer. He has basically refractory disease. We have been trying to get him to [facility] for debulking surgery and HIPEC . Unfortunately, he has not been able to make it over there because of transportation issues and family difficulties.  We have tried salvage chemotherapy. He has not responded. He has had toxicity.  He now is admitted again with abdominal pain. There is no obvious obstruction on CT scan. He does appear to have a new liver lesion.  He was eating well last night when I saw him.  This morning he is having some nausea. He slept well.  We had a very long talk about his situation. I told him that surgery that he needs his only done at specialized centers. That, surgery is not done in town. Even of the surgeons here are very good, they just don't have the experience that [facility] has with this kind of procedure.   I told him that I really cannot think of any further chemotherapy that would have a decent response rate. He has not yet been on Stivarga. This is an oral agent. This could be some that could be tried. I think a response rate of less than 10% probably could be expected.  I have told him that surgery is going to be the best, and in my opinion, the only way that he is going to be able to have any meaningful quality of life. Thankfully, he has not yet obstructed and the issues that he has seems to be more pain related.  He needs to be on something for his bowels to make sure that he has chronic bowel movements and some diarrhea.  He has not had fever. He has not had shortness of breath. He has not had bleeding.  His performance status is still quite good. I would say that his performance status  is ECOG 1.   Past Medical History  Diagnosis Date  . Anemia   . Cancer 10/13 and 2015    colon ca  . History of colon resection   :  Past Surgical History  Procedure Laterality Date  . Colonoscopy  06/17/2012    Procedure: COLONOSCOPY;  Surgeon: Juanita Craver, MD;  Location: [facility] ENDOSCOPY;  Service: Endoscopy;  Laterality: N/A;  . Colon resection  06/19/2012    Procedure: COLON RESECTION LAPAROSCOPIC;  Surgeon: Ralene Ok, MD;  Location: [place];  Service: General;  Laterality: Right;  . Colostomy revision  06/19/2012    Procedure: COLON RESECTION RIGHT;  Surgeon: Ralene Ok, MD;  Location: [place];  Service: General;  Laterality: Right;  :   Current facility-administered medications:  .  0.9 %  sodium chloride infusion, , Intravenous, Continuous, Oswald Hillock, MD, Last Rate: 100 mL/hr at 01/12/15 0153 .  acetaminophen (TYLENOL) tablet 650 mg, 650 mg, Oral, Q6H PRN **OR** acetaminophen (TYLENOL) suppository 650 mg, 650 mg, Rectal, Q6H PRN, Oswald Hillock, MD .  ALPRAZolam Duanne Moron) tablet 0.5 mg, 0.5 mg, Oral, TID PRN, Oswald Hillock, MD .  dronabinol (MARINOL) capsule 5 mg, 5 mg, Oral, BID AC, Oswald Hillock, MD, 5 mg at 01/11/15 1751 .  DULoxetine (CYMBALTA) DR capsule 30 mg, 30 mg, Oral, Daily, Oswald Hillock, MD, 30  mg at 01/11/15 1751 .  enoxaparin (LOVENOX) injection 40 mg, 40 mg, Subcutaneous, Q24H, Oswald Hillock, MD, 40 mg at 01/11/15 2208 .  escitalopram (LEXAPRO) tablet 20 mg, 20 mg, Oral, Daily, Oswald Hillock, MD, 20 mg at 01/11/15 1751 .  morphine (MS CONTIN) 12 hr tablet 30 mg, 30 mg, Oral, BID, Oswald Hillock, MD, 30 mg at 01/11/15 2208 .  polyethylene glycol (MIRALAX / GLYCOLAX) packet 17 g, 17 g, Oral, Daily, Oswald Hillock, MD, 17 g at 01/11/15 1754  Facility-Administered Medications Ordered in Other Encounters:  .  dextrose 5 % solution, , Intravenous, Once, Volanda Napoleon, MD .  sodium chloride 0.9 % injection 10 mL, 10 mL, Intracatheter, PRN, Volanda Napoleon, MD, 10 mL at  11/02/14 1430:  . dronabinol  5 mg Oral BID AC  . DULoxetine  30 mg Oral Daily  . enoxaparin (LOVENOX) injection  40 mg Subcutaneous Q24H  . escitalopram  20 mg Oral Daily  . morphine  30 mg Oral BID  . polyethylene glycol  17 g Oral Daily  :  Allergies  Allergen Reactions  . Morphine Other (See Comments)    Other reaction(s): Dizziness (intolerance)  :  Family History  Problem Relation Age of Onset  . Alcohol abuse Father   . Colon cancer Maternal Grandfather   :  History   Social History  . Marital Status: Married    Spouse Name: N/A  . Number of Children: N/A  . Years of Education: N/A   Occupational History  . Not on file.   Social History Main Topics  . Smoking status: Former Smoker -- 0.50 packs/day for 15 years    Types: Cigarettes    Start date: 10/01/1997    Quit date: 07/31/2013  . Smokeless tobacco: Never Used     Comment: quit 2014   . Alcohol Use: No  . Drug Use: No  . Sexual Activity: Yes   Other Topics Concern  . Not on file   Social History Narrative  :  Pertinent items are noted in HPI.  Exam: Patient Vitals for the past 24 hrs:  BP Temp Temp src Pulse Resp SpO2 Height Weight  01/12/15 0539 (!) 121/93 mmHg 98.6 F (37 C) Oral 76 18 97 % - -  01/11/15 2054 (!) 130/92 mmHg 97.6 F (36.4 C) Oral 83 18 100 % - -  01/11/15 1545 134/84 mmHg 97.9 F (36.6 C) Oral 81 18 97 % 5\' 7"  (1.702 m) 198 lb 11.2 oz (90.13 kg)  01/11/15 1327 130/92 mmHg - - 86 18 100 % - -  01/11/15 1051 128/84 mmHg 97.7 F (36.5 C) Oral 104 20 99 % - -    as above. Abdomen is soft. His bowel sounds are slightly hyperactive. No abdominal masses noted. There is some fullness over on the right side. He has no fluid wave. There is no palpable hepatomegaly. Lungs are clear. Cardiac exam regular rate and rhythm. Extremities shows no clubbing, cyanosis or edema. Back exam no tenderness over the spine, ribs or hips. Skin exam shows slightly dry skin. Neurological exam is  nonfocal. Head and neck exam shows no adenopathy in the neck. He has no scleral icterus.    Recent Labs  01/11/15 1118 01/12/15 0419  WBC 7.5 6.8  HGB 11.5* 10.3*  HCT 37.6* 35.0*  PLT 337 296    Recent Labs  01/11/15 1118 01/12/15 0419  NA 141 138  K 3.4* 3.9  CL 102  101  CO2 28 29  GLUCOSE 106* 108*  BUN 10 7  CREATININE 0.75 0.73  CALCIUM 9.2 8.7*    Blood smear review: None  Pathology: None     Assessment and Plan: Mr. Ringenberg is a 44 year old gentleman with metastatic colon cancer. He has been through multiple lines of chemotherapy. He continues to progress. We have not treated him probably for 6 weeks or so now because of recurrent admissions. He has poor tolerance to most of the regimens that we have tried.  I will call [facility] today. I will speak with his surgeon there. I'll know if we could make a direct transfer to the surgical service at [facility]. Since Mr. Basaldua is having transportation issues, it would make sense if we can get him over to [facility] now for them to evaluate him for the possibility of surgery.  Mr. Nannini says that he is ready for surgery at [facility]. He has had this in the past and unfortunately has not been able to follow through because of various interruptions.  He needs to have constant bowel movements. I would make sure that he is on something that keeps him going.  His pain seems be doing okay for right now.  I'll have to see what kind of nausea medicines we gave him.  I really do feel bad for him. I know that he has a lot of issues that are going on in his life and he is trying to deal with all these.  I do appreciate the great care that he is getting from the hospitalist and the staff on [place].  Isaiah 41:10

## 2015-01-12 NOTE — Progress Notes (Signed)
PROGRESS NOTE    David Conrad OMV:[id number] DOB: 12-22-1970 DOA: 01/11/2015 PCP: PROVIDER NOT IN SYSTEM  HPI/Brief narrative 44 year old male with history of refractory metastatic colon cancer, followed by Dr. Marin Olp who has been trying to get him to [facility] for debulking surgery and HIPEC but unsuccessful thus far, has not responded to/develop toxicity to salvage chemotherapy presented to the [facility] ED on 5/23 with worsening abdominal pain. CT abdomen confirms peritoneal carcinomatosis. Oncology has seen and indicated that he has not been treated with chemotherapy for 6 weeks due to recurrent admissions, poor tolerance to most regimens. Dr. Marin Olp attempting to contact [facility] regarding transfer to surgical service which patient at this time is agreeable to.   Assessment/Plan:  Abdominal pain secondary to metastatic colon cancer - Refractory to several chemotherapy regimens - Oncology consultation appreciated: Attempting to transfer patient to [facility] surgical service for further evaluation and management. - Pain controlled at this time.  Hypokalemia - Replaced  Chronic anemia - Stable. Follow CBCs  Constipation - Related to poor oral intake and pain medications - Bowel regimen   DVT prophylaxis: Lovenox subcutaneously  Code Status: Full Family Communication: None at bedside Disposition Plan: Trying to transfer to [facility] surgical service for further evaluation and management.   Consultants:  Medical oncology  Procedures:  None  Antibiotics:  None   Subjective: States that pain is adequately controlled since hospitalization. Denies nausea or vomiting.  Objective: Filed Vitals:   01/11/15 1327 01/11/15 1545 01/11/15 2054 01/12/15 0539  BP: 130/92 134/84 130/92 121/93  Pulse: 86 81 83 76  Temp:  97.9 F (36.6 C) 97.6 F (36.4 C) 98.6 F (37 C)  TempSrc:  Oral Oral Oral  Resp: 18 18 18 18     Height:  5\' 7"  (1.702 m)    Weight:  90.13 kg (198 lb 11.2 oz)    SpO2: 100% 97% 100% 97%    Intake/Output Summary (Last 24 hours) at 01/12/15 1116 Last data filed at 01/12/15 0600  Gross per 24 hour  Intake   1410 ml  Output      0 ml  Net   1410 ml   Filed Weights   01/11/15 1545  Weight: 90.13 kg (198 lb 11.2 oz)     Exam:  General exam: Pleasant young male sitting up comfortably in bed. Respiratory system: Clear. No increased work of breathing. Cardiovascular system: S1 & S2 heard, RRR. No JVD, murmurs, gallops, clicks or pedal edema. Gastrointestinal system: Abdomen is nondistended, soft. Mild tenderness in the left quadrants without peritoneal signs. Normal bowel sounds heard. Central nervous system: Alert and oriented. No focal neurological deficits. Extremities: Symmetric 5 x 5 power.   Data Reviewed: Basic Metabolic Panel:  Recent Labs Lab 01/11/15 1118 01/12/15 0419  NA 141 138  K 3.4* 3.9  CL 102 101  CO2 28 29  GLUCOSE 106* 108*  BUN 10 7  CREATININE 0.75 0.73  CALCIUM 9.2 8.7*   Liver Function Tests:  Recent Labs Lab 01/11/15 1118 01/12/15 0419  AST 11* 10*  ALT 10* 10*  ALKPHOS 76 71  BILITOT 0.7 0.4  PROT 7.1 6.4*  ALBUMIN 3.2* 2.9*    Recent Labs Lab 01/11/15 1118  LIPASE 12*   No results for input(s): AMMONIA in the last 168 hours. CBC:  Recent Labs Lab 01/11/15 1118 01/12/15 0419  WBC 7.5 6.8  NEUTROABS 4.7  --   HGB 11.5* 10.3*  HCT 37.6* 35.0*  MCV  78.8 78.8  PLT 337 296   Cardiac Enzymes: No results for input(s): CKTOTAL, CKMB, CKMBINDEX, TROPONINI in the last 168 hours. BNP (last 3 results) No results for input(s): PROBNP in the last 8760 hours. CBG: No results for input(s): GLUCAP in the last 168 hours.  No results found for this or any previous visit (from the past 240 hour(s)).        Studies: Ct Abdomen Pelvis W Contrast  01/11/2015   CLINICAL DATA:  Acute left lower quadrant abdominal pain.  Current history of colon cancer.  EXAM: CT ABDOMEN AND PELVIS WITH CONTRAST  TECHNIQUE: Multidetector CT imaging of the abdomen and pelvis was performed using the standard protocol following bolus administration of intravenous contrast.  CONTRAST:  46mL OMNIPAQUE IOHEXOL 300 MG/ML SOLN, 159mL OMNIPAQUE IOHEXOL 300 MG/ML SOLN  COMPARISON:  None available currently.  FINDINGS: Visualized lung bases appear normal. No significant osseous abnormality is noted.  No gallstones are noted. The spleen and pancreas appear normal. 1.6 cm cyst is noted in posterior segment of right hepatic lobe. 28 x 16 mm subcapsular abnormality is noted peripherally in right hepatic lobe with Hounsfield measurement of 40 concerning for metastatic disease. Adrenal glands and kidneys appear normal. No hydronephrosis or renal obstruction is noted. Multiple complex masses are noted throughout the abdomen consistent with peritoneal carcinomatosis. The largest lesion measures 8.3 x 8.1 x 6.9 cm in the right side of the abdomen another lesion measuring 9.1 x 8.1 x 5.1 cm is seen in the left lower quadrant of the abdomen. Urinary bladder is decompressed. Mild fat containing right inguinal hernia is noted. There is no evidence of bowel obstruction.  IMPRESSION: Multiple large masses are within peritoneal space consistent with peritoneal carcinomatosis.  2.8 x 1.6 subcapsular abnormality is seen peripherally in right hepatic lobe concerning for metastatic disease is well.   Electronically Signed   By: Marijo Conception, M.D.   On: 01/11/2015 13:36        Scheduled Meds: . dronabinol  5 mg Oral BID AC  . DULoxetine  30 mg Oral Daily  . enoxaparin (LOVENOX) injection  40 mg Subcutaneous Q24H  . escitalopram  20 mg Oral Daily  . morphine  30 mg Oral BID  . polyethylene glycol  17 g Oral BID  . senna  2 tablet Oral Daily   Continuous Infusions: . sodium chloride 100 mL/hr at 01/12/15 0153    Active Problems:   Cancer of colon   Cancer of  peritoneum/retroperitoneum, secondary   Abdominal pain, left lower quadrant    Time spent: 25 minutes.    Vernell Leep, MD, FACP, FHM. [facility] Pager [phone]  If 7PM-7AM, please contact night-coverage [email or web address] Password [email or web address] 01/12/2015, 11:16 AM    LOS: 1 day

## 2015-01-13 ENCOUNTER — Inpatient Hospital Stay (HOSPITAL_COMMUNITY): Payer: Medicare Other

## 2015-01-13 DIAGNOSIS — C786 Secondary malignant neoplasm of retroperitoneum and peritoneum: Secondary | ICD-10-CM

## 2015-01-13 DIAGNOSIS — C189 Malignant neoplasm of colon, unspecified: Secondary | ICD-10-CM

## 2015-01-13 DIAGNOSIS — R1032 Left lower quadrant pain: Secondary | ICD-10-CM

## 2015-01-13 LAB — COMPREHENSIVE METABOLIC PANEL
ALT: 8 U/L — ABNORMAL LOW (ref 17–63)
AST: 11 U/L — AB (ref 15–41)
Albumin: 2.9 g/dL — ABNORMAL LOW (ref 3.5–5.0)
Alkaline Phosphatase: 72 U/L (ref 38–126)
Anion gap: 11 (ref 5–15)
BUN: 6 mg/dL (ref 6–20)
CHLORIDE: 100 mmol/L — AB (ref 101–111)
CO2: 28 mmol/L (ref 22–32)
Calcium: 8.9 mg/dL (ref 8.9–10.3)
Creatinine, Ser: 0.69 mg/dL (ref 0.61–1.24)
GFR calc Af Amer: >60 mL/min (ref >60)
GFR calc non Af Amer: >60 mL/min (ref >60)
Glucose, Bld: 106 mg/dL — ABNORMAL HIGH (ref 65–99)
POTASSIUM: 3.5 mmol/L (ref 3.5–5.1)
Sodium: 139 mmol/L (ref 135–145)
TOTAL PROTEIN: 6.9 g/dL (ref 6.5–8.1)
Total Bilirubin: 0.5 mg/dL (ref 0.3–1.2)

## 2015-01-13 LAB — CBC WITH DIFFERENTIAL/PLATELET
Basophils Absolute: 0 10*3/uL (ref 0.0–0.1)
Basophils Relative: 0 % (ref 0–1)
Eosinophils Absolute: 0.2 10*3/uL (ref 0.0–0.7)
Eosinophils Relative: 4 % (ref 0–5)
HCT: 34.1 % — ABNORMAL LOW (ref 39.0–52.0)
HEMOGLOBIN: 10.3 g/dL — AB (ref 13.0–17.0)
LYMPHS ABS: 1.2 10*3/uL (ref 0.7–4.0)
LYMPHS PCT: 22 % (ref 12–46)
MCH: 23.7 pg — ABNORMAL LOW (ref 26.0–34.0)
MCHC: 30.2 g/dL (ref 30.0–36.0)
MCV: 78.4 fL (ref 78.0–100.0)
Monocytes Absolute: 0.8 10*3/uL (ref 0.1–1.0)
Monocytes Relative: 14 % — ABNORMAL HIGH (ref 3–12)
NEUTROS PCT: 60 % (ref 43–77)
Neutro Abs: 3.4 10*3/uL (ref 1.7–7.7)
Platelets: 360 10*3/uL (ref 150–400)
RBC: 4.35 MIL/uL (ref 4.22–5.81)
RDW: 17.9 % — ABNORMAL HIGH (ref 11.5–15.5)
WBC: 5.6 10*3/uL (ref 4.0–10.5)

## 2015-01-13 MED ORDER — HYDROMORPHONE HCL 2 MG PO TABS
1.0000 mg | ORAL_TABLET | ORAL | Status: AC | PRN
Start: 2015-01-13 — End: ?

## 2015-01-13 MED ORDER — MORPHINE SULFATE ER 30 MG PO TBCR
60.0000 mg | EXTENDED_RELEASE_TABLET | Freq: Two times a day (BID) | ORAL | Status: DC
  Administered 2015-01-13: 60 mg via ORAL
  Filled 2015-01-13: qty 2

## 2015-01-13 MED ORDER — OXYCODONE-ACETAMINOPHEN 5-325 MG PO TABS: 1.0000 | ORAL_TABLET | Freq: Four times a day (QID) | ORAL | Status: DC | PRN

## 2015-01-13 MED ORDER — HYDROMORPHONE HCL 2 MG/ML IJ SOLN
4.0000 mg | INTRAMUSCULAR | Status: DC | PRN
Start: 2015-01-13 — End: 2015-01-13
  Administered 2015-01-13 (×2): 4 mg via INTRAVENOUS
  Filled 2015-01-13 (×2): qty 2

## 2015-01-13 MED ORDER — ONDANSETRON HCL 4 MG/2ML IJ SOLN: 4.0000 mg | Freq: Four times a day (QID) | INTRAMUSCULAR | Status: DC

## 2015-01-13 NOTE — Discharge Summary (Signed)
Physician Discharge Summary  David Conrad WUJ:[id number] DOB: 1970-11-12 DOA: 01/11/2015  PCP: PROVIDER NOT IN SYSTEM  Admit date: 01/11/2015 Discharge date: 01/13/2015  Time spent: 20 minutes  Recommendations for Outpatient Follow-up:  1. Pt to follow up with General Surgery at [facility] as scheduled 2. Pt to follow up with PCP in 1-2 weeks  Discharge Diagnoses:  Active Problems:   Cancer of colon   Cancer of peritoneum/retroperitoneum, secondary   Abdominal pain, left lower quadrant   Discharge Condition: Stable  Diet recommendation: Regular  Filed Weights   01/11/15 1545  Weight: 90.13 kg (198 lb 11.2 oz)    History of present illness:  Please see admit h and p from 5/23 for details. Briefly, pt presented with complaints of abd pain in the setting of known peritonal carcinomatosis/colon cancer. The patient was admitted for further work up.  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 Marin Olp and the patient is scheduled to follow up this week - Continue with pain regimen as tolerated - Patient again advised to follow up with surgeon as scheduled.  Hypokalemia - Replaced  Chronic anemia - Remained stable.  Constipation - Related to poor oral intake and pain medications - Bowel regimen as tolerated  Procedures:  none  Consultations:  Oncology  Discharge Exam: Filed Vitals:   01/12/15 0539 01/12/15 1336 01/12/15 2053 01/13/15 0501  BP: 121/93 141/89 126/81 120/78  Pulse: 76 87 87 78  Temp: 98.6 F (37 C) 97.5 F (36.4 C) 98.3 F (36.8 C) 97.6 F (36.4 C)  TempSrc: Oral Oral Oral Oral  Resp: 18 16 16 16   Height:      Weight:      SpO2: 97% 95% 92% 97%    General: Awake, in nad Cardiovascular: regular, s1, s2 Respiratory: normal resp effort, no  wheezing  Discharge Instructions     Medication List    STOP taking these medications        oxyCODONE 5 MG immediate release tablet  Commonly known as:  Oxy IR/ROXICODONE     oxyCODONE-acetaminophen 5-325 MG per tablet  Commonly known as:  PERCOCET/ROXICET     PRESCRIPTION MEDICATION      TAKE these medications        clonazePAM 0.5 MG tablet  Commonly known as:  KLONOPIN  Take 1 tablet (0.5 mg total) by mouth 3 (three) times daily as needed for anxiety.     dexamethasone 4 MG tablet  Commonly known as:  DECADRON  Take 4 mg by mouth 2 (two) times daily with a meal. Start on the day after chemo for 3 days     DM-Doxylamine-Acetaminophen 15-6.25-325 MG/15ML Liqd  Take 30 mLs by mouth every other day as needed (for cold).     dronabinol 5 MG capsule  Commonly known as:  MARINOL  Take 1 capsule (5 mg total) by mouth 2 (two) times daily before lunch and supper.     DULoxetine 30 MG capsule  Commonly known as:  CYMBALTA  Take 1 capsule (30 mg total) by mouth daily.     escitalopram 20 MG tablet  Commonly known as:  LEXAPRO  Take 20 mg by mouth daily.     HYDROmorphone 2 MG tablet  Commonly known as:  DILAUDID  Take 0.5 tablets (1 mg total) by mouth every 4 (four) hours as  needed for severe pain.     lactulose 10 GM/15ML solution  Commonly known as:  CHRONULAC  Take 30 mLs (20 g total) by mouth daily as needed for mild constipation.     lidocaine-prilocaine cream  Commonly known as:  EMLA  Apply topically as needed.     loperamide 2 MG tablet  Commonly known as:  IMODIUM A-D  Take 2 at onset of diarrhea, then 1 every 2hrs until 12hr without a BM. May take 2 tab every 4hrs at bedtime. If diarrhea recurs repeat.     LORazepam 0.5 MG tablet  Commonly known as:  ATIVAN  Take 0.5 mg by mouth at bedtime.     metoCLOPramide 10 MG tablet  Commonly known as:  REGLAN  Take 1 tablet (10 mg total) by mouth 4 (four) times daily -  before meals and at bedtime.      morphine 30 MG 12 hr tablet  Commonly known as:  MS CONTIN  Take 30 mg by mouth 2 (two) times daily.     polyethylene glycol packet  Commonly known as:  MIRALAX / GLYCOLAX  Take 17 g by mouth daily.     vitamin B-6 250 MG tablet  Take 1 tablet (250 mg total) by mouth daily.     ZOFRAN 8 MG tablet  Generic drug:  ondansetron  Take by mouth every 8 (eight) hours as needed for nausea or vomiting.       Allergies  Allergen Reactions  . Morphine Other (See Comments)    Other reaction(s): Dizziness (intolerance)   Follow-up Information    Follow up with Follow up with your PCP in 1-2 weeks.      Schedule an appointment as soon as possible for a visit with Volanda Napoleon, MD.   Specialty:  Oncology   Why:  Hospital follow up   Contact information:   [place], [address] [place] [address] [phone]       Follow up with Jyl Heinz, MD.   Specialty:  Surgery   Why:  Hospital follow up - on Friday 5/27 arrive at 12:30pm   Contact information:   [place] [address] [phone]        The results of significant diagnostics from this hospitalization (including imaging, microbiology, ancillary and laboratory) are listed below for reference.    Significant Diagnostic Studies: Ct Abdomen Pelvis W Contrast  01/11/2015   CLINICAL DATA:  Acute left lower quadrant abdominal pain. Current history of colon cancer.  EXAM: CT ABDOMEN AND PELVIS WITH CONTRAST  TECHNIQUE: Multidetector CT imaging of the abdomen and pelvis was performed using the standard protocol following bolus administration of intravenous contrast.  CONTRAST:  59mL OMNIPAQUE IOHEXOL 300 MG/ML SOLN, 163mL OMNIPAQUE IOHEXOL 300 MG/ML SOLN  COMPARISON:  None available currently.  FINDINGS: Visualized lung bases appear normal. No significant osseous abnormality is noted.  No gallstones are noted. The spleen and pancreas appear normal. 1.6 cm cyst is noted in  posterior segment of right hepatic lobe. 28 x 16 mm subcapsular abnormality is noted peripherally in right hepatic lobe with Hounsfield measurement of 40 concerning for metastatic disease. Adrenal glands and kidneys appear normal. No hydronephrosis or renal obstruction is noted. Multiple complex masses are noted throughout the abdomen consistent with peritoneal carcinomatosis. The largest lesion measures 8.3 x 8.1 x 6.9 cm in the right side of the abdomen another lesion measuring 9.1 x 8.1 x 5.1 cm is seen  in the left lower quadrant of the abdomen. Urinary bladder is decompressed. Mild fat containing right inguinal hernia is noted. There is no evidence of bowel obstruction.  IMPRESSION: Multiple large masses are within peritoneal space consistent with peritoneal carcinomatosis.  2.8 x 1.6 subcapsular abnormality is seen peripherally in right hepatic lobe concerning for metastatic disease is well.   Electronically Signed   By: Marijo Conception, M.D.   On: 01/11/2015 13:36   Dg Abd 2 Views  01/13/2015   CLINICAL DATA:  Left lower quadrant pain, suspected peritoneal carcinomatosis.  EXAM: ABDOMEN - 2 VIEW  COMPARISON:  CT scan of the abdomen and pelvis of Jan 11, 2015  FINDINGS: There is oral contrast within the transverse, descending, and rectosigmoid portions of the colon from the earlier CT scan. There are multiple small bowel air-fluid levels in the right mid abdomen. There is fluid and air within the stomach. There is bibasilar atelectasis of the lungs. No soft tissue calcifications within the abdomen are demonstrated.  IMPRESSION: 1. Findings consistent with partial mid to distal small bowel obstruction. Previously administered oral contrast has traversed the small bowel and is now in the normal calibered colon. Nasogastric suction may be useful if the patient is experiencing significant discomfort. 2. Bibasilar subsegmental atelectasis of the lungs.   Electronically Signed   By: David  Martinique M.D.   On:  01/13/2015 09:12   Dg Abd 2 Views  12/17/2014   CLINICAL DATA:  Ileus. Abdominal bloating and intermittent cramping. Peritoneal carcinomatosis.  EXAM: ABDOMEN - 2 VIEW  COMPARISON:  12/16/2014, 12/16/2014 and 12/15/2014 and 12/14/2014  FINDINGS: There are multiple air-fluid levels in nondistended loops of large and small bowel. There is increased air in the bowel since the prior exam.  No free air.  Scarring at the lung bases.  IMPRESSION: Increased air-fluid levels and air in nondistended loops of large and small bowel. Findings are consistent with ileus.   Electronically Signed   By: Lorriane Shire M.D.   On: 12/17/2014 10:04   Dg Abd 2 Views  12/16/2014   CLINICAL DATA:  Follow-up ileus  EXAM: ABDOMEN - 2 VIEW  COMPARISON:  12/15/2014  FINDINGS: Nonobstructive bowel gas pattern.  Mildly prominent but nondilated loops of small bowel in the central abdomen, suggesting small bowel ileus.  Mild residual contrast in the rectum.  No evidence of free air under the diaphragm on the upright view.  Visualized osseous structures are within normal limits.  IMPRESSION: Possible adynamic small bowel ileus.  No evidence of small bowel obstruction or free air.   Electronically Signed   By: Julian Hy M.D.   On: 12/16/2014 10:21   Dg Abd 2 Views  12/15/2014   CLINICAL DATA:  Vomiting of, abdominal pain, history of colon cancer, history of previous bowel obstruction  EXAM: ABDOMEN - 2 VIEW  COMPARISON:  Abdominal series of December 14, 2014  FINDINGS: Supine and upright abdominal films reveal small air-fluid levels within the colon. There is a small amount of distended small bowel projecting over the lower lumbar spine in the midline. There is a small amount of contrast remaining in the left colon and rectum. There is no free extraluminal gas demonstrated.  IMPRESSION: Findings consistent with small and large bowel ileus. There is no evidence of perforation. There has been further distal migration of colonic contrast.    Electronically Signed   By: David  Martinique M.D.   On: 12/15/2014 13:22    Microbiology: No results found for  this or any previous visit (from the past 240 hour(s)).   Labs: Basic Metabolic Panel:  Recent Labs Lab 01/11/15 1118 01/12/15 0419 01/13/15 0336  NA 141 138 139  K 3.4* 3.9 3.5  CL 102 101 100*  CO2 28 29 28   GLUCOSE 106* 108* 106*  BUN 10 7 6   CREATININE 0.75 0.73 0.69  CALCIUM 9.2 8.7* 8.9   Liver Function Tests:  Recent Labs Lab 01/11/15 1118 01/12/15 0419 01/13/15 0336  AST 11* 10* 11*  ALT 10* 10* 8*  ALKPHOS 76 71 72  BILITOT 0.7 0.4 0.5  PROT 7.1 6.4* 6.9  ALBUMIN 3.2* 2.9* 2.9*    Recent Labs Lab 01/11/15 1118  LIPASE 12*   No results for input(s): AMMONIA in the last 168 hours. CBC:  Recent Labs Lab 01/11/15 1118 01/12/15 0419 01/13/15 0336  WBC 7.5 6.8 5.6  NEUTROABS 4.7  --  3.4  HGB 11.5* 10.3* 10.3*  HCT 37.6* 35.0* 34.1*  MCV 78.8 78.8 78.4  PLT 337 296 360   Cardiac Enzymes: No results for input(s): CKTOTAL, CKMB, CKMBINDEX, TROPONINI in the last 168 hours. BNP: BNP (last 3 results) No results for input(s): BNP in the last 8760 hours.  ProBNP (last 3 results) No results for input(s): PROBNP in the last 8760 hours.  CBG: No results for input(s): GLUCAP in the last 168 hours.  Signed:  CHIU, Orpah Melter  [facility] 01/13/2015, 4:15 PM

## 2015-01-13 NOTE — Progress Notes (Signed)
David Conrad is having some more issues with abdominal pain. I think we will have to increase the MS Contin to 60 mg twice a day. I would increase his Dilaudid that he is getting IV to every 2 hours as needed. I will check another abdominal film on him.  I did speak with his surgeon at [facility]. His surgery will be scheduled for next Thursday. He wants to see David Conrad this Friday in his clinic. Hopefully, David Conrad will be home. If not, then we MUST get him transported out there for the appointment. This I think is essential. I really think that this is the last chance that he is going to have to get the surgery done.  Please make sure that a copy of his recent CT scan is put on a disc so David Conrad can take this out with him.  He is having a little more nausea. I will increase his Zofran to every 6 hour on a schedule.  His labs look okay. His potassium 3.5. Kidney function is okay. His albumin is 2.9.  On his exam, his vital signs are pretty stable. He is afebrile. Blood pressure 120/78. On his physical exam, he has some slight abdominal tenderness over in the left abdomen. He has good bowel sounds. There may be some slight distention. Lungs are clear. Cardiac exam regular rate and rhythm. Extremity shows no clubbing, cyanosis or edema.  I spoke to David Conrad at length this morning. I told him that the surgeons will still operate on him despite having the liver lesion. Again, I think this is his last chance for surgery.  Again, we have to make sure he gets to this appointment on Friday. The appointment is at 1:00. If he is still in the hospital, we MUST make arrangements to get him out there.  I do appreciate all the gray care that he is getting.  Pete E.

## 2015-01-14 ENCOUNTER — Emergency Department (HOSPITAL_COMMUNITY)
Admission: EM | Admit: 2015-01-14 | Discharge: 2015-01-15 | Disposition: A | Discharge status: Other Acute Inpatient Hospital | Payer: Medicare Other | Attending: Emergency Medicine | Admitting: Emergency Medicine

## 2015-01-14 ENCOUNTER — Encounter (HOSPITAL_COMMUNITY): Payer: Self-pay | Admitting: *Deleted

## 2015-01-14 DIAGNOSIS — K5900 Constipation, unspecified: Secondary | ICD-10-CM | POA: Diagnosis not present

## 2015-01-14 DIAGNOSIS — C786 Secondary malignant neoplasm of retroperitoneum and peritoneum: Secondary | ICD-10-CM | POA: Diagnosis not present

## 2015-01-14 DIAGNOSIS — Z79899 Other long term (current) drug therapy: Secondary | ICD-10-CM | POA: Diagnosis not present

## 2015-01-14 DIAGNOSIS — R1084 Generalized abdominal pain: Secondary | ICD-10-CM | POA: Diagnosis present

## 2015-01-14 DIAGNOSIS — Z862 Personal history of diseases of the blood and blood-forming organs and certain disorders involving the immune mechanism: Secondary | ICD-10-CM | POA: Diagnosis not present

## 2015-01-14 DIAGNOSIS — C189 Malignant neoplasm of colon, unspecified: Secondary | ICD-10-CM | POA: Diagnosis not present

## 2015-01-14 DIAGNOSIS — Z87891 Personal history of nicotine dependence: Secondary | ICD-10-CM | POA: Insufficient documentation

## 2015-01-14 DIAGNOSIS — R109 Unspecified abdominal pain: Secondary | ICD-10-CM

## 2015-01-14 MED ORDER — HYDROMORPHONE HCL 1 MG/ML IJ SOLN
1.0000 mg | Freq: Once | INTRAMUSCULAR | Status: AC
  Administered 2015-01-14: 1 mg via INTRAVENOUS
  Filled 2015-01-14: qty 1

## 2015-01-14 MED ORDER — ONDANSETRON HCL 4 MG/2ML IJ SOLN
4.0000 mg | Freq: Once | INTRAMUSCULAR | Status: AC
  Administered 2015-01-14: 4 mg via INTRAVENOUS
  Filled 2015-01-14: qty 2

## 2015-01-14 MED ORDER — SODIUM CHLORIDE 0.9 % IV BOLUS (SEPSIS)
1000.0000 mL | Freq: Once | INTRAVENOUS | Status: AC
  Administered 2015-01-14: 1000 mL via INTRAVENOUS

## 2015-01-14 NOTE — ED Provider Notes (Addendum)
CSN: [id number]     Arrival date & time 01/14/15  2234 History   First MD Initiated Contact with Patient 01/14/15 2309     Chief Complaint  Patient presents with  . Abdominal Pain  . 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 Pt was discharged from the hospital day with pain meds. Pt was never pain free when he left - but this morning he started having generalized pain again followed with emesis and loose BM. Pt has had 7 episodes of emesis, and were initially yellow and then black. Pt had 3 loose BM, with the last 2 being dark. No hx of GI bleed. Pt is not on any anticoagulant.    ROS 10 Systems reviewed and are negative for acute change except as noted in the HPI.     Patient is a 44 y.o. male presenting with abdominal pain and vomiting. The history is provided by the patient.  Abdominal Pain Associated symptoms: diarrhea, nausea and vomiting   Emesis Associated symptoms: abdominal pain and diarrhea     Past Medical History  Diagnosis Date  . Anemia   . Cancer 10/13 and 2015    colon ca  . History of colon resection    Past Surgical History  Procedure Laterality Date  . Colonoscopy  06/17/2012    Procedure: COLONOSCOPY;  Surgeon: Juanita Craver, MD;  Location: [facility] ENDOSCOPY;  Service: Endoscopy;  Laterality: N/A;  . Colon resection  06/19/2012    Procedure: COLON RESECTION LAPAROSCOPIC;  Surgeon: Ralene Ok, MD;  Location: [place];  Service: General;  Laterality: Right;  . Colostomy revision  06/19/2012    Procedure: COLON RESECTION RIGHT;  Surgeon: Ralene Ok, MD;  Location: [place];  Service: General;  Laterality: Right;   Family History  Problem Relation Age of Onset  . Alcohol abuse Father   . Colon cancer Maternal Grandfather    History  Substance Use Topics  . Smoking status:  Former Smoker -- 0.50 packs/day for 15 years    Types: Cigarettes    Start date: 10/01/1997    Quit date: 07/31/2013  . Smokeless tobacco: Never Used     Comment: quit 2014   . Alcohol Use: No    Review of Systems  Gastrointestinal: Positive for nausea, vomiting, abdominal pain and diarrhea.  All other systems reviewed and are negative.     Allergies  Morphine  Home Medications   Prior to Admission medications   Medication Sig Start Date End Date Taking? Authorizing Provider  clonazePAM (KLONOPIN) 0.5 MG tablet Take 1 tablet (0.5 mg total) by mouth 3 (three) times daily as needed for anxiety. 12/18/14  Yes Albertine Patricia, MD  dexamethasone (DECADRON) 4 MG tablet Take 4 mg by mouth 2 (two) times daily with a meal. Start on the day after chemo for 3 days   Yes Historical Provider, MD  DM-Doxylamine-Acetaminophen 15-6.25-325 MG/15ML LIQD Take 30 mLs by mouth every other day as needed (for cold).   Yes Historical Provider, MD  DULoxetine (CYMBALTA) 30 MG capsule Take 1 capsule (30 mg total) by mouth daily. 12/18/14  Yes Silver Huguenin Elgergawy, MD  escitalopram (LEXAPRO) 20 MG tablet Take 20 mg by mouth daily. 01/06/15  Yes Historical Provider, MD  HYDROmorphone (DILAUDID) 2 MG tablet Take 0.5 tablets (1 mg total) by mouth every 4 (four) hours as needed  for severe pain. 01/13/15  Yes Donne Hazel, MD  lidocaine-prilocaine (EMLA) cream Apply topically as needed. Patient taking differently: Apply 1 application topically as needed.  01/21/14  Yes Volanda Napoleon, MD  LORazepam (ATIVAN) 0.5 MG tablet Take 0.5 mg by mouth at bedtime.   Yes Historical Provider, MD  morphine (MS CONTIN) 30 MG 12 hr tablet Take 30 mg by mouth 2 (two) times daily. 11/20/14  Yes Historical Provider, MD  ondansetron (ZOFRAN) 8 MG tablet Take by mouth every 8 (eight) hours as needed for nausea or vomiting.   Yes Historical Provider, MD  oxyCODONE-acetaminophen (PERCOCET/ROXICET) 5-325 MG per tablet Take 1 tablet by mouth  every 6 (six) hours as needed (pain). for pain 01/03/15  Yes Historical Provider, MD  polyethylene glycol (MIRALAX / GLYCOLAX) packet Take 17 g by mouth daily. Patient taking differently: Take 17 g by mouth daily as needed for mild constipation (constipation).  10/13/14  Yes Charlynne Cousins, MD  Pyridoxine HCl (VITAMIN B-6) 250 MG tablet Take 1 tablet (250 mg total) by mouth daily. 09/23/14  Yes Volanda Napoleon, MD  dronabinol (MARINOL) 5 MG capsule Take 1 capsule (5 mg total) by mouth 2 (two) times daily before lunch and supper. Patient not taking: Reported on 01/14/2015 12/18/14   Silver Huguenin Elgergawy, MD  lactulose (CHRONULAC) 10 GM/15ML solution Take 30 mLs (20 g total) by mouth daily as needed for mild constipation. Patient not taking: Reported on 01/11/2015 10/13/14   Charlynne Cousins, MD  loperamide (IMODIUM A-D) 2 MG tablet Take 2 at onset of diarrhea, then 1 every 2hrs until 12hr without a BM. May take 2 tab every 4hrs at bedtime. If diarrhea recurs repeat. Patient not taking: Reported on 01/11/2015 11/02/14   Volanda Napoleon, MD  metoCLOPramide (REGLAN) 10 MG tablet Take 1 tablet (10 mg total) by mouth 4 (four) times daily -  before meals and at bedtime. Patient not taking: Reported on 01/11/2015 12/18/14   Silver Huguenin Elgergawy, MD   BP 137/89 mmHg  Pulse 86  Temp(Src) 98.2 F (36.8 C) (Oral)  Resp 20  SpO2 97% Physical Exam  Constitutional: He is oriented to person, place, and time. He appears well-developed.  HENT:  Head: Normocephalic and atraumatic.  Eyes: Conjunctivae and EOM are normal. Pupils are equal, round, and reactive to light.  Neck: Normal range of motion. Neck supple.  Cardiovascular: Normal rate and regular rhythm.   Pulmonary/Chest: Effort normal and breath sounds normal.  Abdominal: Soft. Bowel sounds are normal. He exhibits no distension. There is tenderness. There is guarding. There is no rebound.  Neurological: He is alert and oriented to person, place, and time.   Skin: Skin is warm.  Nursing note and vitals reviewed.   ED Course  Procedures (including critical care time) Labs Review Labs Reviewed  CBC WITH DIFFERENTIAL/PLATELET - Abnormal; Notable for the following:    Hemoglobin 11.2 (*)    HCT 36.6 (*)    MCH 23.9 (*)    RDW 18.0 (*)    Platelets 417 (*)    Monocytes Relative 13 (*)    All other components within normal limits  COMPREHENSIVE METABOLIC PANEL - Abnormal; Notable for the following:    Potassium 3.4 (*)    Chloride 99 (*)    Albumin 3.1 (*)    AST 10 (*)    ALT 10 (*)    Anion gap 16 (*)    All other components within normal limits  LIPASE, BLOOD -  Abnormal; Notable for the following:    Lipase 10 (*)    All other components within normal limits    Imaging Review Dg Abd 2 Views  01/13/2015   CLINICAL DATA:  Left lower quadrant pain, suspected peritoneal carcinomatosis.  EXAM: ABDOMEN - 2 VIEW  COMPARISON:  CT scan of the abdomen and pelvis of Jan 11, 2015  FINDINGS: There is oral contrast within the transverse, descending, and rectosigmoid portions of the colon from the earlier CT scan. There are multiple small bowel air-fluid levels in the right mid abdomen. There is fluid and air within the stomach. There is bibasilar atelectasis of the lungs. No soft tissue calcifications within the abdomen are demonstrated.  IMPRESSION: 1. Findings consistent with partial mid to distal small bowel obstruction. Previously administered oral contrast has traversed the small bowel and is now in the normal calibered colon. Nasogastric suction may be useful if the patient is experiencing significant discomfort. 2. Bibasilar subsegmental atelectasis of the lungs.   Electronically Signed   By: David  Martinique M.D.   On: 01/13/2015 09:12     EKG Interpretation None     @1 :10 am - Spoke with [facility] admission line. They will admit the patient and make appropriate consults.    MDM   Final diagnoses:  Constipation  Abdominal pain  Colon  cancer    Pt comes in with cc of nausea, emesis, abdominal pain. Pt has known hx of metastatic colon CA and has peritoneal abdominal carcinomatosis. Pt was just discharged from the hospital yday, and comes in with return of the pain. Notes reviewed, and it seems like our oncology team thinks patient will benefit from abd debulking, and they have contacted with [facility] due to complicated nature of the surgery.  Pt has no means for transportation to [place], and has an appointment tomorrow - and per our notes, he might be able to get the surgery done next week.  Pt agreeable to transfer to [facility] and getting their opinion. I dont see any benefit, besides pain control at [facility], and Dr. Humberto Seals made it clear in his note that pt has a narrow window to get the surgery done - as there are no significnat complications at this time.  With Hb higher than last admission, BUN normal - doubt upper GI bleed. No indication for repeat CT in the ER.   Varney Biles, MD 01/15/15 [place], MD 01/15/15 [id number]  Varney Biles, MD 01/15/15 3329

## 2015-01-14 NOTE — ED Notes (Signed)
Pt admits to n/v/d and generalized abd pain that began this morning - pt admits was recently seen for same - pt w/ hx of colon CA and has surgery scheduled at [facility] on Thursday, pt scheduled for intraabdominal hyperthermic chemotherapy. Pt reports today has noted emesis to be "dark and tarry" pt took zofran, dilaudid and oxycodone at home w/o relief. Pt administered 4mg  IVP zofran by EMS en route.

## 2015-01-15 DIAGNOSIS — Z79899 Other long term (current) drug therapy: Secondary | ICD-10-CM | POA: Diagnosis not present

## 2015-01-15 DIAGNOSIS — C786 Secondary malignant neoplasm of retroperitoneum and peritoneum: Secondary | ICD-10-CM | POA: Diagnosis not present

## 2015-01-15 DIAGNOSIS — C189 Malignant neoplasm of colon, unspecified: Secondary | ICD-10-CM | POA: Diagnosis not present

## 2015-01-15 DIAGNOSIS — Z862 Personal history of diseases of the blood and blood-forming organs and certain disorders involving the immune mechanism: Secondary | ICD-10-CM | POA: Diagnosis not present

## 2015-01-15 DIAGNOSIS — R1084 Generalized abdominal pain: Secondary | ICD-10-CM | POA: Diagnosis present

## 2015-01-15 DIAGNOSIS — K5900 Constipation, unspecified: Secondary | ICD-10-CM | POA: Diagnosis not present

## 2015-01-15 DIAGNOSIS — Z87891 Personal history of nicotine dependence: Secondary | ICD-10-CM | POA: Diagnosis not present

## 2015-01-15 LAB — CBC WITH DIFFERENTIAL/PLATELET
Basophils Absolute: 0 10*3/uL (ref 0.0–0.1)
Basophils Relative: 0 % (ref 0–1)
Eosinophils Absolute: 0.1 10*3/uL (ref 0.0–0.7)
Eosinophils Relative: 1 % (ref 0–5)
HCT: 36.6 % — ABNORMAL LOW (ref 39.0–52.0)
Hemoglobin: 11.2 g/dL — ABNORMAL LOW (ref 13.0–17.0)
LYMPHS ABS: 1.2 10*3/uL (ref 0.7–4.0)
Lymphocytes Relative: 16 % (ref 12–46)
MCH: 23.9 pg — ABNORMAL LOW (ref 26.0–34.0)
MCHC: 30.6 g/dL (ref 30.0–36.0)
MCV: 78 fL (ref 78.0–100.0)
MONOS PCT: 13 % — AB (ref 3–12)
Monocytes Absolute: 1 10*3/uL (ref 0.1–1.0)
Neutro Abs: 5.2 10*3/uL (ref 1.7–7.7)
Neutrophils Relative %: 70 % (ref 43–77)
Platelets: 417 10*3/uL — ABNORMAL HIGH (ref 150–400)
RBC: 4.69 MIL/uL (ref 4.22–5.81)
RDW: 18 % — ABNORMAL HIGH (ref 11.5–15.5)
WBC: 7.5 10*3/uL (ref 4.0–10.5)

## 2015-01-15 LAB — COMPREHENSIVE METABOLIC PANEL
ALBUMIN: 3.1 g/dL — AB (ref 3.5–5.0)
ALK PHOS: 76 U/L (ref 38–126)
ALT: 10 U/L — AB (ref 17–63)
ANION GAP: 16 — AB (ref 5–15)
AST: 10 U/L — ABNORMAL LOW (ref 15–41)
BUN: 8 mg/dL (ref 6–20)
CO2: 25 mmol/L (ref 22–32)
Calcium: 9 mg/dL (ref 8.9–10.3)
Chloride: 99 mmol/L — ABNORMAL LOW (ref 101–111)
Creatinine, Ser: 0.64 mg/dL (ref 0.61–1.24)
GFR calc Af Amer: >60 mL/min (ref >60)
GFR calc non Af Amer: >60 mL/min (ref >60)
GLUCOSE: 88 mg/dL (ref 65–99)
Potassium: 3.4 mmol/L — ABNORMAL LOW (ref 3.5–5.1)
Sodium: 140 mmol/L (ref 135–145)
Total Bilirubin: 1.2 mg/dL (ref 0.3–1.2)
Total Protein: 7.3 g/dL (ref 6.5–8.1)

## 2015-01-15 LAB — LIPASE, BLOOD: LIPASE: 10 U/L — AB (ref 22–51)

## 2015-01-15 MED ORDER — ONDANSETRON HCL 4 MG/2ML IJ SOLN
INTRAMUSCULAR | Status: AC
  Filled 2015-01-15: qty 2

## 2015-01-15 MED ORDER — ONDANSETRON HCL 4 MG/2ML IJ SOLN: 4.0000 mg | Freq: Once | INTRAMUSCULAR | Status: DC

## 2015-01-15 MED ORDER — SODIUM CHLORIDE 0.9 % IV BOLUS (SEPSIS)
1000.0000 mL | Freq: Once | INTRAVENOUS | Status: AC
Start: 2015-01-15 — End: 2015-01-15
  Administered 2015-01-15: 1000 mL via INTRAVENOUS

## 2015-01-15 MED ORDER — HYDROMORPHONE HCL 1 MG/ML IJ SOLN
1.0000 mg | Freq: Once | INTRAMUSCULAR | Status: AC
  Administered 2015-01-15: 1 mg via INTRAVENOUS
  Filled 2015-01-15: qty 1

## 2015-02-09 ENCOUNTER — Telehealth: Payer: Self-pay | Admitting: Emergency Medicine

## 2015-02-09 NOTE — Telephone Encounter (Signed)
Per Dr Marin Olp; patient admitted to Hospice with Dr Marin Olp at patient's attending and Hospice MD for symptom management.

## 2015-02-13 ENCOUNTER — Encounter (HOSPITAL_COMMUNITY): Payer: Self-pay | Admitting: Oncology

## 2015-02-13 ENCOUNTER — Observation Stay (HOSPITAL_COMMUNITY)
Admission: EM | Admit: 2015-02-13 | Discharge: 2015-02-19 | Disposition: E | Attending: Internal Medicine | Admitting: Internal Medicine

## 2015-02-13 DIAGNOSIS — R4182 Altered mental status, unspecified: Secondary | ICD-10-CM | POA: Diagnosis not present

## 2015-02-13 DIAGNOSIS — D649 Anemia, unspecified: Secondary | ICD-10-CM | POA: Insufficient documentation

## 2015-02-13 DIAGNOSIS — C801 Malignant (primary) neoplasm, unspecified: Secondary | ICD-10-CM

## 2015-02-13 DIAGNOSIS — C762 Malignant neoplasm of abdomen: Secondary | ICD-10-CM | POA: Diagnosis not present

## 2015-02-13 DIAGNOSIS — Z87891 Personal history of nicotine dependence: Secondary | ICD-10-CM | POA: Diagnosis not present

## 2015-02-13 DIAGNOSIS — R000 Tachycardia, unspecified: Secondary | ICD-10-CM | POA: Diagnosis not present

## 2015-02-13 DIAGNOSIS — R109 Unspecified abdominal pain: Secondary | ICD-10-CM | POA: Diagnosis present

## 2015-02-13 DIAGNOSIS — C786 Secondary malignant neoplasm of retroperitoneum and peritoneum: Secondary | ICD-10-CM | POA: Diagnosis not present

## 2015-02-13 DIAGNOSIS — C799 Secondary malignant neoplasm of unspecified site: Secondary | ICD-10-CM | POA: Diagnosis not present

## 2015-02-13 DIAGNOSIS — Z79899 Other long term (current) drug therapy: Secondary | ICD-10-CM | POA: Diagnosis not present

## 2015-02-13 DIAGNOSIS — Z515 Encounter for palliative care: Secondary | ICD-10-CM

## 2015-02-13 DIAGNOSIS — C182 Malignant neoplasm of ascending colon: Secondary | ICD-10-CM

## 2015-02-13 DIAGNOSIS — Z9889 Other specified postprocedural states: Secondary | ICD-10-CM | POA: Diagnosis not present

## 2015-02-13 DIAGNOSIS — C189 Malignant neoplasm of colon, unspecified: Principal | ICD-10-CM | POA: Insufficient documentation

## 2015-02-13 DIAGNOSIS — Z720 Tobacco use: Secondary | ICD-10-CM

## 2015-02-13 HISTORY — DX: Major depressive disorder, single episode, unspecified: F32.9

## 2015-02-13 HISTORY — DX: Depression, unspecified: F32.A

## 2015-02-13 MED ORDER — ACETAMINOPHEN 650 MG RE SUPP
650.0000 mg | Freq: Four times a day (QID) | RECTAL | Status: DC | PRN
Start: 2015-02-13 — End: 2015-02-14

## 2015-02-13 MED ORDER — SODIUM CHLORIDE 0.9 % IV SOLN
2.0000 mg/h | INTRAVENOUS | Status: DC
  Administered 2015-02-13 – 2015-02-14 (×3): 2 mg/h via INTRAVENOUS
  Filled 2015-02-13 (×3): qty 2.5

## 2015-02-13 MED ORDER — LORAZEPAM 2 MG/ML IJ SOLN
1.0000 mg | Freq: Once | INTRAMUSCULAR | Status: AC
  Administered 2015-02-13: 1 mg via INTRAVENOUS
  Filled 2015-02-13: qty 1

## 2015-02-13 MED ORDER — ACETAMINOPHEN 325 MG PO TABS: 650.0000 mg | ORAL_TABLET | Freq: Four times a day (QID) | ORAL | Status: DC | PRN

## 2015-02-13 MED ORDER — SODIUM CHLORIDE 0.9 % IV SOLN
INTRAVENOUS | Status: AC
  Administered 2015-02-13: 13:00:00 via INTRAVENOUS

## 2015-02-13 MED ORDER — HYDROMORPHONE HCL 1 MG/ML IJ SOLN
1.0000 mg | INTRAMUSCULAR | Status: DC | PRN
  Administered 2015-02-13: 1 mg via INTRAVENOUS
  Filled 2015-02-13: qty 1

## 2015-02-13 MED ORDER — SCOPOLAMINE 1 MG/3DAYS TD PT72
1.0000 | MEDICATED_PATCH | TRANSDERMAL | Status: DC
  Administered 2015-02-13: 1.5 mg via TRANSDERMAL
  Filled 2015-02-13: qty 1

## 2015-02-13 MED ORDER — SODIUM CHLORIDE 0.9 % IV SOLN
1.0000 mg/h | INTRAVENOUS | Status: DC
  Filled 2015-02-13: qty 2.5

## 2015-02-13 MED ORDER — HYDROMORPHONE HCL 1 MG/ML IJ SOLN
1.0000 mg | Freq: Once | INTRAMUSCULAR | Status: AC
  Administered 2015-02-13: 1 mg via INTRAVENOUS
  Filled 2015-02-13: qty 1

## 2015-02-13 MED ORDER — HALOPERIDOL LACTATE 5 MG/ML IJ SOLN: 2.0000 mg | Freq: Four times a day (QID) | INTRAMUSCULAR | Status: DC | PRN

## 2015-02-13 MED ORDER — LORAZEPAM 2 MG/ML IJ SOLN
1.0000 mg | INTRAMUSCULAR | Status: DC | PRN
  Administered 2015-02-13 – 2015-02-14 (×6): 1 mg via INTRAVENOUS
  Filled 2015-02-13 (×6): qty 1

## 2015-02-13 MED ORDER — HALOPERIDOL LACTATE 5 MG/ML IJ SOLN
2.0000 mg | Freq: Once | INTRAMUSCULAR | Status: AC
  Administered 2015-02-13: 2 mg via INTRAVENOUS
  Filled 2015-02-13: qty 1

## 2015-02-13 MED ORDER — HYDROMORPHONE BOLUS VIA INFUSION
1.0000 mg | INTRAVENOUS | Status: DC | PRN
  Administered 2015-02-13 – 2015-02-14 (×8): 1 mg via INTRAVENOUS
  Filled 2015-02-13 (×9): qty 1

## 2015-02-13 NOTE — ED Provider Notes (Signed)
CSN: [id number]     Arrival date & time 01/23/2015  0515 History   First MD Initiated Contact with Patient 02/09/2015 0524     Chief Complaint  Patient presents with  . Abdominal Pain     (Consider location/radiation/quality/duration/timing/severity/associated sxs/prior Treatment) HPI 44 year old male presents to the emergency department from home via EMS with reported altered mental status, worsening abdominal distention and pain.  Patient has history of metastatic colon cancer with peritoneal carcinomatosis.  He was discharged from [facility] to comfort care/hospice on June 22.  Patient has requested to be at home and not be can place, per conversation with hospice nurse.  Hospice nurse, has been out of the house twice tonight.  Patient is becoming more agitated.  He has less responsive than usual, per family.  He has low-grade fever here.  He is DO NOT RESUSCITATE and Comfort Care.  He is to receive Haldol, Ativan and is on a continuous Dilaudid pump.  Patient came to the emergency department.  His family felt that they could not care for him further at home.  He is calmly by his mother.  Wife has remained at home.  I spoke to her on the phone, and she is feeling overwhelmed with his care.  Level V caveat, patient cannot give history Past Medical History  Diagnosis Date  . Anemia   . Cancer 10/13 and 2015    colon ca  . History of colon resection    Past Surgical History  Procedure Laterality Date  . Colonoscopy  06/17/2012    Procedure: COLONOSCOPY;  Surgeon: Juanita Craver, MD;  Location: [facility] ENDOSCOPY;  Service: Endoscopy;  Laterality: N/A;  . Colon resection  06/19/2012    Procedure: COLON RESECTION LAPAROSCOPIC;  Surgeon: Ralene Ok, MD;  Location: [place];  Service: General;  Laterality: Right;  . Colostomy revision  06/19/2012    Procedure: COLON RESECTION RIGHT;  Surgeon: Ralene Ok, MD;  Location: [place];  Service: General;  Laterality: Right;   Family History  Problem  Relation Age of Onset  . Alcohol abuse Father   . Colon cancer Maternal Grandfather    History  Substance Use Topics  . Smoking status: Former Smoker -- 0.50 packs/day for 15 years    Types: Cigarettes    Start date: 10/01/1997    Quit date: 07/31/2013  . Smokeless tobacco: Never Used     Comment: quit 2014   . Alcohol Use: No    Review of Systems   Level V caveat, confusion Allergies  Morphine  Home Medications   Prior to Admission medications   Medication Sig Start Date End Date Taking? Authorizing Provider  clonazePAM (KLONOPIN) 0.5 MG tablet Take 1 tablet (0.5 mg total) by mouth 3 (three) times daily as needed for anxiety. 12/18/14   Silver Huguenin Elgergawy, MD  dexamethasone (DECADRON) 4 MG tablet Take 4 mg by mouth 2 (two) times daily with a meal. Start on the day after chemo for 3 days    Historical Provider, MD  DM-Doxylamine-Acetaminophen 15-6.25-325 MG/15ML LIQD Take 30 mLs by mouth every other day as needed (for cold).    Historical Provider, MD  dronabinol (MARINOL) 5 MG capsule Take 1 capsule (5 mg total) by mouth 2 (two) times daily before lunch and supper. Patient not taking: Reported on 01/14/2015 12/18/14   Silver Huguenin Elgergawy, MD  DULoxetine (CYMBALTA) 30 MG capsule Take 1 capsule (30 mg total) by mouth daily. 12/18/14   Albertine Patricia, MD  escitalopram [place])  20 MG tablet Take 20 mg by mouth daily. 01/06/15   Historical Provider, MD  HYDROmorphone (DILAUDID) 2 MG tablet Take 0.5 tablets (1 mg total) by mouth every 4 (four) hours as needed for severe pain. 01/13/15   Donne Hazel, MD  lactulose (CHRONULAC) 10 GM/15ML solution Take 30 mLs (20 g total) by mouth daily as needed for mild constipation. Patient not taking: Reported on 01/11/2015 10/13/14   Charlynne Cousins, MD  lidocaine-prilocaine (EMLA) cream Apply topically as needed. Patient taking differently: Apply 1 application topically as needed.  01/21/14   Volanda Napoleon, MD  loperamide (IMODIUM A-D) 2 MG  tablet Take 2 at onset of diarrhea, then 1 every 2hrs until 12hr without a BM. May take 2 tab every 4hrs at bedtime. If diarrhea recurs repeat. Patient not taking: Reported on 01/11/2015 11/02/14   Volanda Napoleon, MD  LORazepam (ATIVAN) 0.5 MG tablet Take 0.5 mg by mouth at bedtime.    Historical Provider, MD  metoCLOPramide (REGLAN) 10 MG tablet Take 1 tablet (10 mg total) by mouth 4 (four) times daily -  before meals and at bedtime. Patient not taking: Reported on 01/11/2015 12/18/14   Silver Huguenin Elgergawy, MD  morphine (MS CONTIN) 30 MG 12 hr tablet Take 30 mg by mouth 2 (two) times daily. 11/20/14   Historical Provider, MD  ondansetron (ZOFRAN) 8 MG tablet Take by mouth every 8 (eight) hours as needed for nausea or vomiting.    Historical Provider, MD  oxyCODONE-acetaminophen (PERCOCET/ROXICET) 5-325 MG per tablet Take 1 tablet by mouth every 6 (six) hours as needed (pain). for pain 01/03/15   Historical Provider, MD  polyethylene glycol (MIRALAX / GLYCOLAX) packet Take 17 g by mouth daily. Patient taking differently: Take 17 g by mouth daily as needed for mild constipation (constipation).  10/13/14   Charlynne Cousins, MD  Pyridoxine HCl (VITAMIN B-6) 250 MG tablet Take 1 tablet (250 mg total) by mouth daily. 09/23/14   Volanda Napoleon, MD   BP 142/98 mmHg  Pulse 146  Temp(Src) 100.3 F (37.9 C) (Rectal)  Resp 30  SpO2 97% Physical Exam  Constitutional: He appears well-developed and well-nourished. He appears distressed.  Patient appears uncomfortable, acutely ill, warmth to the touch.  He is agitated, moving frequently in the bed  HENT:  Head: Normocephalic and atraumatic.  Nose: Nose normal.  Mouth/Throat: Oropharynx is clear and moist.  Eyes: Conjunctivae and EOM are normal. Pupils are equal, round, and reactive to light.  Neck: Normal range of motion. Neck supple. No JVD present. No tracheal deviation present. No thyromegaly present.  Cardiovascular: Regular rhythm, normal heart sounds and  intact distal pulses.  Exam reveals no gallop and no friction rub.   No murmur heard. Tachycardia  Pulmonary/Chest: Effort normal and breath sounds normal. No stridor. No respiratory distress. He has no wheezes. He has no rales. He exhibits no tenderness.  Abdominal: Soft. Bowel sounds are normal. He exhibits distension. He exhibits no mass. There is no tenderness. There is no rebound and no guarding.  Patient has increased bowel sounds, significant abdominal distention  Musculoskeletal: Normal range of motion. He exhibits no edema or tenderness.  Lymphadenopathy:    He has no cervical adenopathy.  Neurological: He displays normal reflexes. He exhibits normal muscle tone. Coordination normal.  Patient is confused, cannot answer questions, not oriented  Skin: Skin is warm and dry. No rash noted. No erythema. No pallor.  Psychiatric: He has a normal mood and affect.  His behavior is normal. Judgment and thought content normal.  Nursing note and vitals reviewed.   ED Course  Procedures (including critical care time) Labs Review Labs Reviewed - No data to display  Imaging Review No results found.   EKG Interpretation None      MDM   Final diagnoses:  Metastasis from colon cancer  Patient on full hospice care    44 year old male with end-stage abdominal cancer.  It appears that his port became dislodged, and he has not been receiving pain medicine.  Care was discussed with on-call hospice nurse.  It is been the patient's wish to stay going to be able to social worker from hospice will be involved this morning to help with placement [facility].  We will continue aggressive pain and agitation management.    Linton Flemings, MD 02/13/2015 [phone]

## 2015-02-13 NOTE — Progress Notes (Signed)
Wasted 480 mg Dilaudid from the CAD pump with Nancy Marus

## 2015-02-13 NOTE — ED Notes (Addendum)
Resting quietly with eye closed. Easily arousable. Verbally responsive. Resp even and slightly labored. ABC's intact. Pt noted de-sating to 88% RA. Applied O2 at 2lpm via [place]. Pt used urinal and voided 375ml. Family at bedside.

## 2015-02-13 NOTE — ED Notes (Signed)
Per EMS pt on Hospice d/t colon CA.  Pt had decreased LOC, increased pain despite dilaudid pump into pt's PICC line.  Per EMS pt's abdomen is distended and pt is making very little sense.

## 2015-02-13 NOTE — ED Notes (Addendum)
Resting quietly with eye closed. Easily arousable. Verbally responsive. Resp even and slightly labored. ABC's intact. Family at bedside.

## 2015-02-13 NOTE — Progress Notes (Signed)
Wasted 480 mg Dilaudid from the CAD pump with Valentina Shaggy

## 2015-02-13 NOTE — ED Notes (Signed)
It appears that pt may have tugged at port causing it to not be properly accessed.  Deaccessed port needle present on admission and re accessed port again.  Upon entering the room this writer noticed that pt had pulled port needle out again.  Peripheral IV started, existing dilaudid gtt attached to peripheral IV and site wrapped is gauze to protect it.

## 2015-02-13 NOTE — ED Notes (Signed)
Resting quietly with eye closed. Easily arousable. Verbally responsive. Resp even and slightly labored. ABC's intact. Family at bedside.

## 2015-02-13 NOTE — ED Notes (Signed)
Bed: LO75 Expected date:  Expected time:  Means of arrival:  Comments: EMS 78M hospice patient

## 2015-02-13 NOTE — ED Notes (Addendum)
Pt reported having pain and attempting to climb out of bed. Pt repositioned. Dr. Vanita Panda aware with new order noted. Bed alarm applied to bed. Mother at bedside.

## 2015-02-13 NOTE — ED Provider Notes (Signed)
I discussed patient's case with our hospitalist colleagues as well as with the patient's palliative care team. There is no residential hospice placement available today, but there is tomorrow. The patient will be admitted to this facility with comfort care measures with expectation for placement at residential hospice tomorrow.  Carmin Muskrat, MD 02/18/2015 1113

## 2015-02-13 NOTE — Progress Notes (Signed)
Pt was asleep during visit; met pt's wife, mother, aunt, uncle, and siblings bedside. Pt's family is appropriately tearful; wife appeared most calm w/o tears but obviously sad and when I introduced myself she immediately asked for prayer. David Conrad and I had had extensive visits during his previous hospitalizations. He loved his family very much and today it was very apparent that they love him back. Chaplain and family gathered hand in hand bedside for prayer for which they were very grateful. Please page if any changes or if family needs additional assistance. [place] [phone]   02/03/2015 1400  Clinical Encounter Type  Visited With Family

## 2015-02-13 NOTE — H&P (Signed)
[facility] History and Physical  David Conrad OZH:[id number] DOB: [birth year] DOA: 02/10/2015  Referring physician: Emergency Department PCP: PROVIDER NOT IN SYSTEM  Specialists:   Chief Complaint: Abd pain, mental status change  HPI: David Conrad is a 44 y.o. male  With an unfortunate hx of metastatic colon cancer. Pt was recently admitted in 5/16 where he was strongly recommended to follow up with Surgery at [facility]. Per family, pt "didn't make it" to surgery and given the extent of his malignancy was referred to hospice/comfort measures only. Pt presents to the ED from home hospice with increased agitation and confusion. Family expresses concerns that they are now unable to care for patient. SW was consulted through ED and pt has been accepted to [facility], however, bed reportedly will not be available until 6/26. Hospitalist consulted for consideration for admission.  Review of Systems:  Unable to obtain as pt is agitated and confused  Past Medical History  Diagnosis Date  . Anemia   . Cancer 10/13 and 2015    colon ca  . History of colon resection    Past Surgical History  Procedure Laterality Date  . Colonoscopy  06/17/2012    Procedure: COLONOSCOPY;  Surgeon: Juanita Craver, MD;  Location: [facility] ENDOSCOPY;  Service: Endoscopy;  Laterality: N/A;  . Colon resection  06/19/2012    Procedure: COLON RESECTION LAPAROSCOPIC;  Surgeon: Ralene Ok, MD;  Location: [place];  Service: General;  Laterality: Right;  . Colostomy revision  06/19/2012    Procedure: COLON RESECTION RIGHT;  Surgeon: Ralene Ok, MD;  Location: [place];  Service: General;  Laterality: Right;   Social History:  reports that he quit smoking about 18 months ago. His smoking use included Cigarettes. He started smoking about 17 years ago. He has a 7.5 pack-year smoking history. He has never used smokeless tobacco. He reports that he does not drink alcohol or use illicit drugs.  where does patient  live--home, ALF, SNF? and with whom if at home?  Can patient participate in ADLs?  Allergies  Allergen Reactions  . Morphine Other (See Comments)    Other reaction(s): Dizziness (intolerance)    Family History  Problem Relation Age of Onset  . Alcohol abuse Father   . Colon cancer Maternal Grandfather     (be sure to complete)  Prior to Admission medications   Medication Sig Start Date End Date Taking? Authorizing Provider  dexamethasone (DECADRON) 10 MG/ML injection Inject 8 mg into the vein. 02/10/15  Yes Historical Provider, MD  escitalopram (LEXAPRO) 20 MG tablet Take 20 mg by mouth daily.   Yes Historical Provider, MD  fentaNYL (DURAGESIC - DOSED MCG/HR) 100 MCG/HR Place 1 patch onto the skin. 02/10/15  Yes Historical Provider, MD  haloperidol (HALDOL) 2 MG/ML solution Take 1 mg by mouth every 6 (six) hours.   Yes Historical Provider, MD  haloperidol lactate (HALDOL) 5 MG/ML injection Inject 0.5 mg into the vein. 02/10/15  Yes Historical Provider, MD  HYDROmorphone (DILAUDID) 2 MG tablet Take 0.5 tablets (1 mg total) by mouth every 4 (four) hours as needed for severe pain. 01/13/15  Yes Donne Hazel, MD  LORazepam (ATIVAN) 0.5 MG tablet Take 0.5-1 mg by mouth every 4 (four) hours as needed for anxiety.   Yes Historical Provider, MD  ondansetron (ZOFRAN ODT) 4 MG disintegrating tablet Take 4 mg by mouth. 02/10/15 02/17/15 Yes Historical Provider, MD  promethazine (PHENERGAN) 25 MG suppository Place 25 mg rectally every 6 (six) hours as  needed for nausea or vomiting.   Yes Historical Provider, MD  clonazePAM (KLONOPIN) 0.5 MG tablet Take 1 tablet (0.5 mg total) by mouth 3 (three) times daily as needed for anxiety. 12/18/14   Silver Huguenin Elgergawy, MD  clonazePAM (KLONOPIN) 0.5 MG tablet Take 0.5 mg by mouth.    Historical Provider, MD  dexamethasone (DECADRON) 4 MG tablet Take 4 mg by mouth 2 (two) times daily with a meal. Start on the day after chemo for 3 days    Historical Provider, MD   DM-Doxylamine-Acetaminophen 15-6.25-325 MG/15ML LIQD Take 30 mLs by mouth every other day as needed (for cold).    Historical Provider, MD  dronabinol (MARINOL) 5 MG capsule Take 1 capsule (5 mg total) by mouth 2 (two) times daily before lunch and supper. Patient not taking: Reported on 01/14/2015 12/18/14   Silver Huguenin Elgergawy, MD  DULoxetine (CYMBALTA) 30 MG capsule Take 1 capsule (30 mg total) by mouth daily. Patient not taking: Reported on 02/12/2015 12/18/14   Silver Huguenin Elgergawy, MD  lactulose (CHRONULAC) 10 GM/15ML solution Take 30 mLs (20 g total) by mouth daily as needed for mild constipation. Patient not taking: Reported on 01/11/2015 10/13/14   Charlynne Cousins, MD  lidocaine-prilocaine (EMLA) cream Apply topically as needed. Patient taking differently: Apply 1 application topically as needed.  01/21/14   Volanda Napoleon, MD  loperamide (IMODIUM A-D) 2 MG tablet Take 2 at onset of diarrhea, then 1 every 2hrs until 12hr without a BM. May take 2 tab every 4hrs at bedtime. If diarrhea recurs repeat. Patient not taking: Reported on 01/11/2015 11/02/14   Volanda Napoleon, MD  metoCLOPramide (REGLAN) 10 MG tablet Take 1 tablet (10 mg total) by mouth 4 (four) times daily -  before meals and at bedtime. Patient not taking: Reported on 01/11/2015 12/18/14   Silver Huguenin Elgergawy, MD  polyethylene glycol (MIRALAX / Floria Raveling) packet Take 17 g by mouth daily. Patient taking differently: Take 17 g by mouth daily as needed for mild constipation (constipation).  10/13/14   Charlynne Cousins, MD  Pyridoxine HCl (VITAMIN B-6) 250 MG tablet Take 1 tablet (250 mg total) by mouth daily. 09/23/14   Volanda Napoleon, MD   Physical Exam: Filed Vitals:   01/31/2015 6967 02/09/2015 0810 02/07/2015 0900 02/03/2015 1000  BP: 137/99 141/96 127/88 137/96  Pulse: 134 134 130 135  Temp:      TempSrc:      Resp: 34 17    SpO2: 92% 94% 92% 95%     General:  Awake, confused, agitated  Eyes: PERRL B  ENT: membranes moist,  dentition fair  Neck: trachea midline, neck supple  Cardiovascular: regular, s1, s2  Respiratory: normal resp effort, no wheezing  Abdomen: distended, midline scar noted  Skin: normal skin turgor, no abnormal skin lesions seen  Musculoskeletal: perfused, no clubbing  Psychiatric: mood/affect normal// no auditory/visual hallucinations  Neurologic: cn2-3 grossly intact strength/sensation intact  Labs on Admission:  Basic Metabolic Panel: No results for input(s): NA, K, CL, CO2, GLUCOSE, BUN, CREATININE, CALCIUM, MG, PHOS in the last 168 hours. Liver Function Tests: No results for input(s): AST, ALT, ALKPHOS, BILITOT, PROT, ALBUMIN in the last 168 hours. No results for input(s): LIPASE, AMYLASE in the last 168 hours. No results for input(s): AMMONIA in the last 168 hours. CBC: No results for input(s): WBC, NEUTROABS, HGB, HCT, MCV, PLT in the last 168 hours. Cardiac Enzymes: No results for input(s): CKTOTAL, CKMB, CKMBINDEX, TROPONINI in the last  168 hours.  BNP (last 3 results) No results for input(s): BNP in the last 8760 hours.  ProBNP (last 3 results) No results for input(s): PROBNP in the last 8760 hours.  CBG: No results for input(s): GLUCAP in the last 168 hours.  Radiological Exams on Admission: No results found.  Assessment/Plan Principal Problem:   Metastasis from colon cancer Active Problems:   Colon cancer, ascending, pT3, pN2a (6/18 LN) , pMX    Tobacco abuse   Abdominal pain   Cancer of peritoneum/retroperitoneum, secondary   Metastatic cancer   1. Metastatic colon cancer 1. Unfortunate case 2. Pt with comfort measures only and has been accepted to [facility]. However, bed not available until 6/26 3. Will cont with dilaudid gtt, titrate to comfort 4. Cont with PRN ativan for agitation 5. Admit to med-surg, obs until dispo to [facility] 2. Hx tobacco abuse 1. By hx 3. Abd pain  1. Secondary to malignancy 2. Per above, dilaudid gtt 4. DVT  prophylaxis 1. Comfort measures only 5. End of Life 1. Comfort measures only per family wishes 2. Code status confirmed DNR/DNI per family  Code Status: DNR/DNI/Comfort (must indicate code status--if unknown or must be presumed, indicate so) Family Communication: Pt in room, family at bedside (indicate person spoken with, if applicable, with phone number if by telephone) Disposition Plan: admit med-surg (indicate anticipated LOS)   [facility], [facility] Pager [phone]  If 7PM-7AM, please contact night-coverage [email or web address] Password [facility] 02/13/2015, 11:27 AM

## 2015-02-13 NOTE — ED Notes (Signed)
Resting quietly with eye closed. Easily arousable. Verbally responsive. Resp even and unlabored. ABC's intact. IV continues to infuse Dilaudid via CAD without difficulty. Bed alarm remains on bed. Family at bedside.

## 2015-02-14 MED ORDER — VITAMINS A & D EX OINT
TOPICAL_OINTMENT | CUTANEOUS | Status: AC
  Administered 2015-02-14: 5
  Filled 2015-02-14: qty 5

## 2015-02-19 NOTE — Discharge Summary (Signed)
Death Summary  David Conrad CZY:[id number] DOB: Dec 27, 1970 DOA: 02-21-2015  PCP: PROVIDER NOT IN SYSTEM PCP/Office notified: Summary forwarded to Dr. Marin Olp  Admit date: 02-21-2015 Date of Death: Feb 22, 2015  Final Diagnoses:  Principal Problem:   Metastasis from colon cancer Active Problems:   Colon cancer, ascending, pT3, pN2a (6/18 LN) , pMX    Tobacco abuse   Abdominal pain   Cancer of peritoneum/retroperitoneum, secondary   Metastatic cancer  History of present illness:  Please review H and P from 02/21/23. This was a very unfortunate case. Pt presented from home hospice with increased agitation and abdominal pain in the setting of terminal colon cancer. The patient was admitted for further management and discharge planning.  Hospital Course:  The patient was admitted to the floor with full comfort measures. He was initially accepted to [facility], however, bed was not available until Feb 22, 2023. The patient was continued with dilaudid gtt, titrated to patient comfort and with PRN ativan for agitation. The patient's overall condition deteriorated rapidly. The patient was later found without spontaneous respirations and no heart sounds and was pronounced at 1303 on 02-22-23. Family remained at bedside. Emotional and spiritual support was offered.  Time of death: 11-26-01  Signed:  Richrd Kuzniar K  [facility] 02/22/15, 1:34 PM

## 2015-02-19 NOTE — Progress Notes (Signed)
Nutrition Brief Note  Chart reviewed. Pt transitioning to comfort care.  No nutrition interventions warranted at this time.  Please consult as needed.   Clayton Bibles, MS, RD, LDN Pager: [phone] After Hours Pager: [phone]

## 2015-02-19 NOTE — Progress Notes (Signed)
RN called into room by family. Patient with no respirations, no heartbeat.  Death pronounced at 1303 with Clotilde Dieter, RN.

## 2015-02-19 NOTE — Progress Notes (Signed)
Wasted 5cc of dilaudid drip with Mardi Mainland, RN as witness.

## 2015-02-19 NOTE — Progress Notes (Signed)
David Conrad passed just before I was paged to his room. His wife, mother, children and a host of other relatives were bedside. Pt's mother was very distraught; I engaged in helping care for her and David Conrad who was also having waves of grief mixed with moments of supporting others through theirs. As the family was leaving I offered support to David Conrad who was intensely grieving to the point that she appeared to pass out while we were in the conference room. I immediately alerted nurses who promptly responded and escorted family w/pt to ED. Chaplain remained with remainder of family until they left pt's room. During the course my extended time with family, David Conrad wanted prayer. I also learned they are members of a local church and at least one minster in the family. Family was very grateful for staff and Chaplain support during their extremely difficult time. Chaplain Marlise Eves Holder   March 05, 2015 1400  Clinical Encounter Type  Visited With Family

## 2015-02-19 NOTE — Progress Notes (Signed)
David Conrad Room 1344-[facility] RN Visit-Stacie Delice Lesch RN, BSN  This is a related, covered admission to his [facility] diagnosis of colon CA.  Patient is a confirmed DNR.    Patient seen in room with family at bedside.  Patient was eligible to be admitted to [facility], however family and primary RN feel patient is too unstable to transfer at this time.  Patient has now been started on a Dilaudid gtt and is unresponsive.  Respirations shallow, and audible gurgle noted.   Threasa Beards, [facility] social worker aware that patient will not transfer.  Offered family emotional and spiritual support with prayer at bedside per request.  Left [facility] number to call with any additional needs.  Please call with any questions or concerns.  [facility] Liaison [phone]

## 2015-02-19 DEATH — deceased

## 2016-02-09 ENCOUNTER — Other Ambulatory Visit: Payer: Self-pay | Admitting: Nurse Practitioner

## 2016-05-15 IMAGING — CR DG ABDOMEN 2V
3 series · 3 of 3 positions shown · non-contrast
Comparison: None.

CLINICAL DATA: History of colon cancer. Status post right
hemicolectomy. Nausea and vomiting. Evaluate for bowel obstruction.

EXAM:
ABDOMEN - 2 VIEW

[w abdomen upright * (1 of 2)]
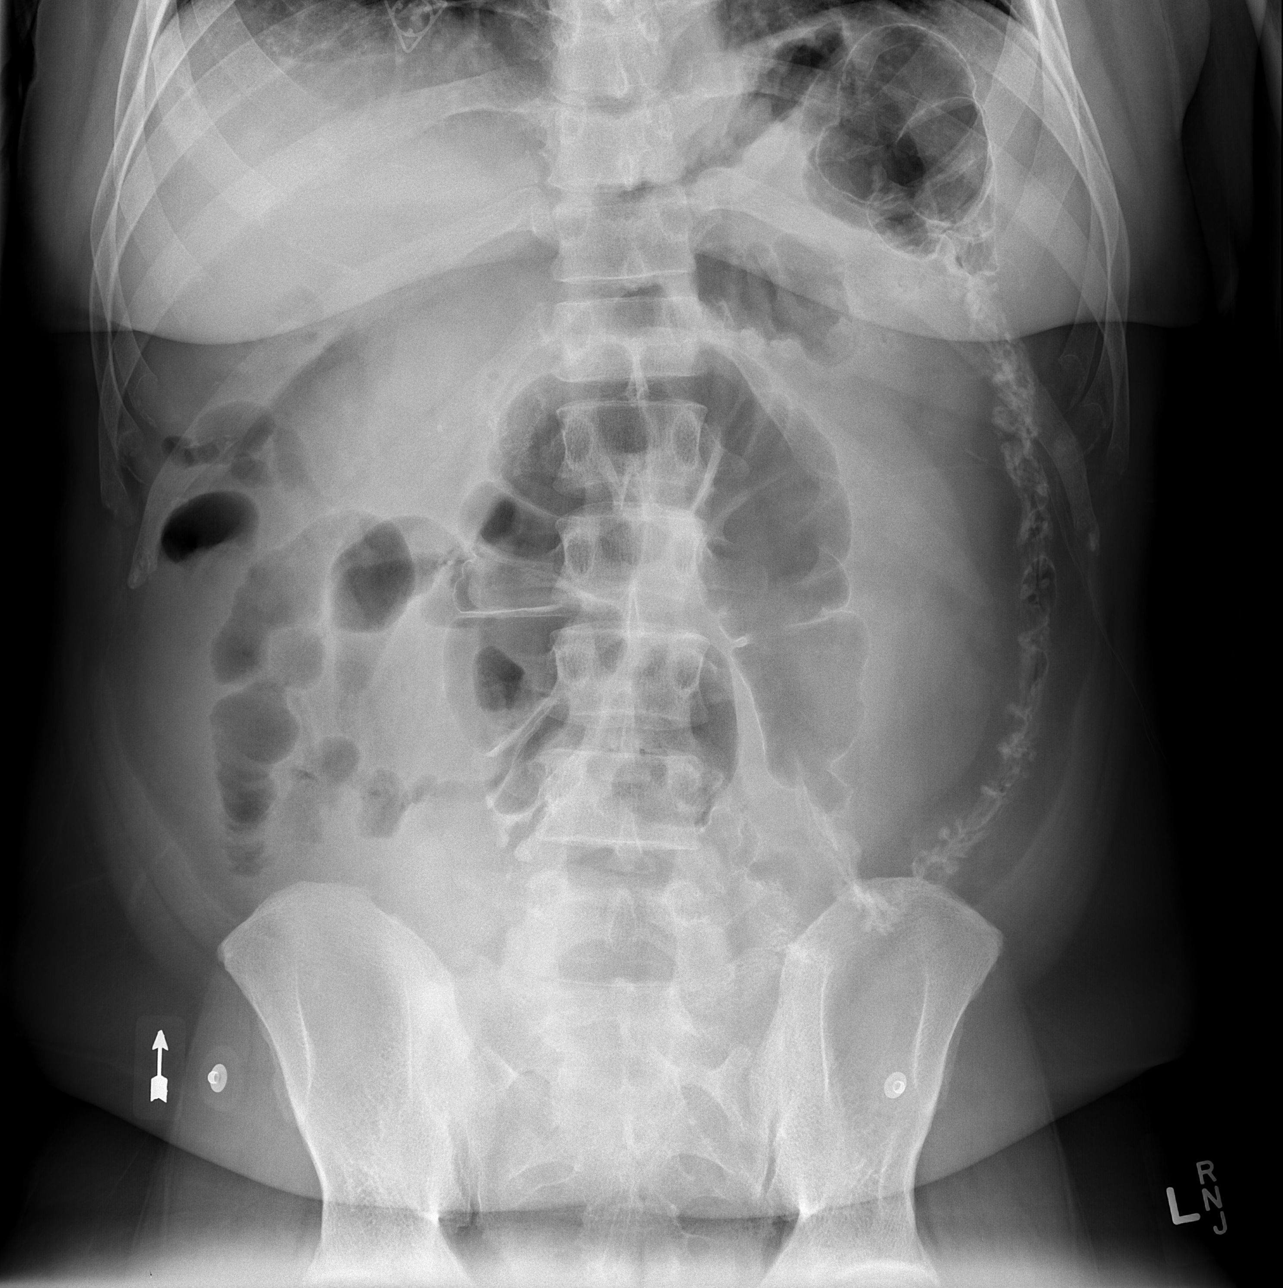

[w abdomen upright * (2 of 2)]
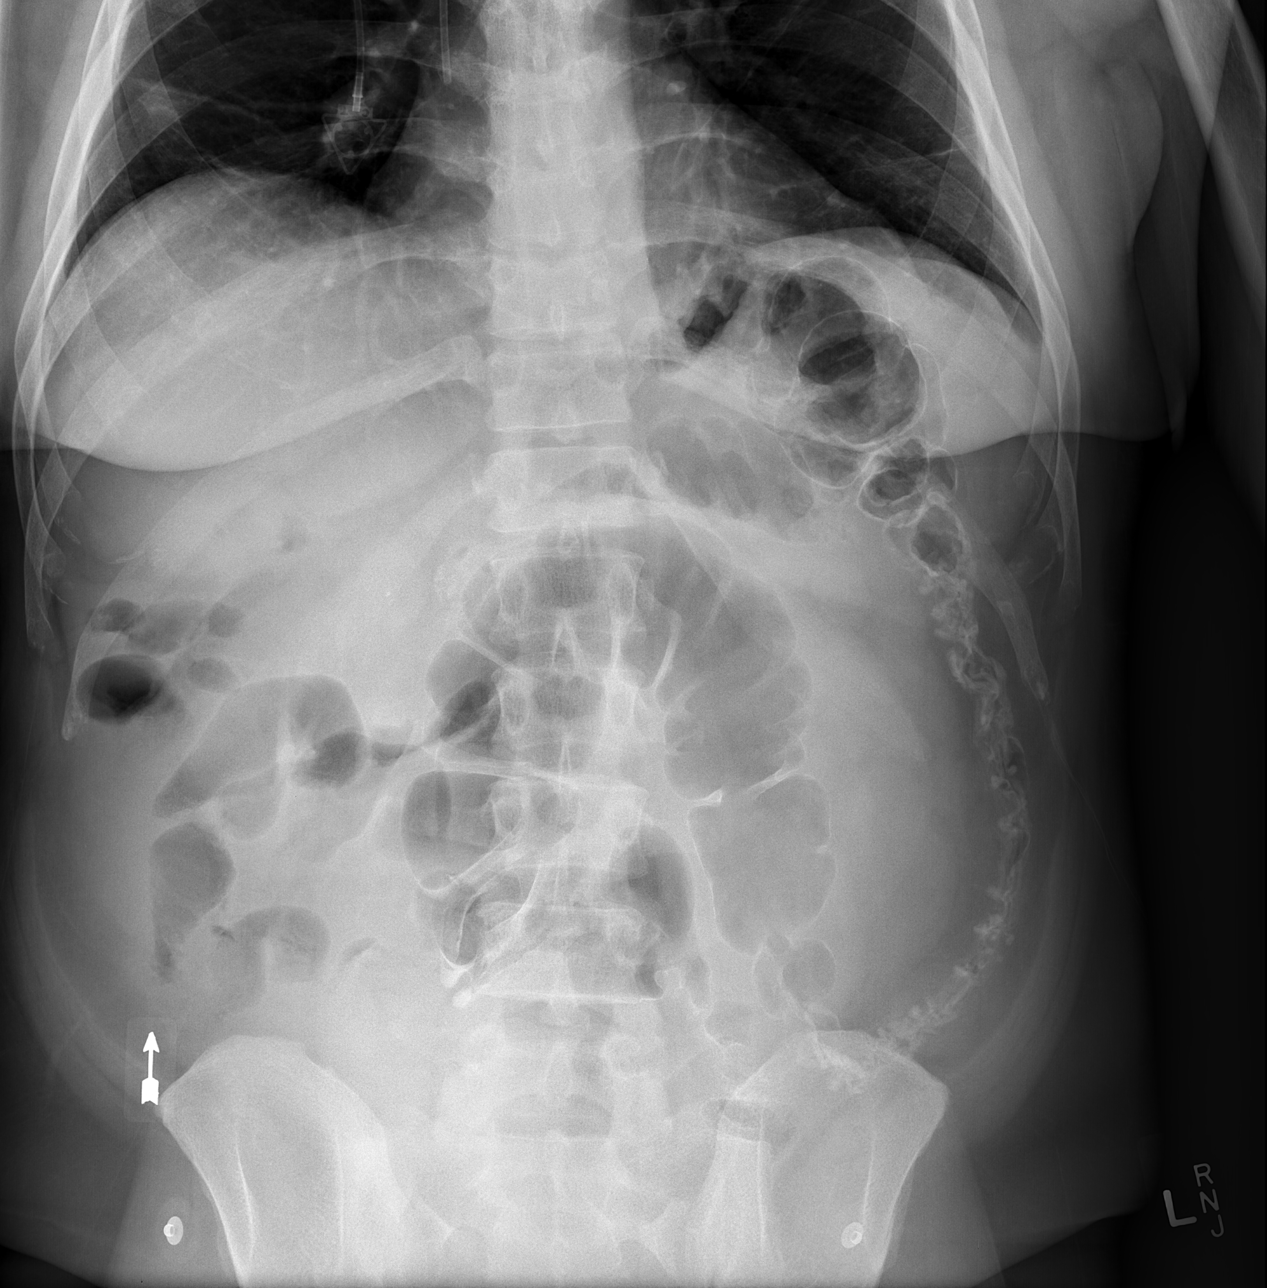

[t abdomen supine]
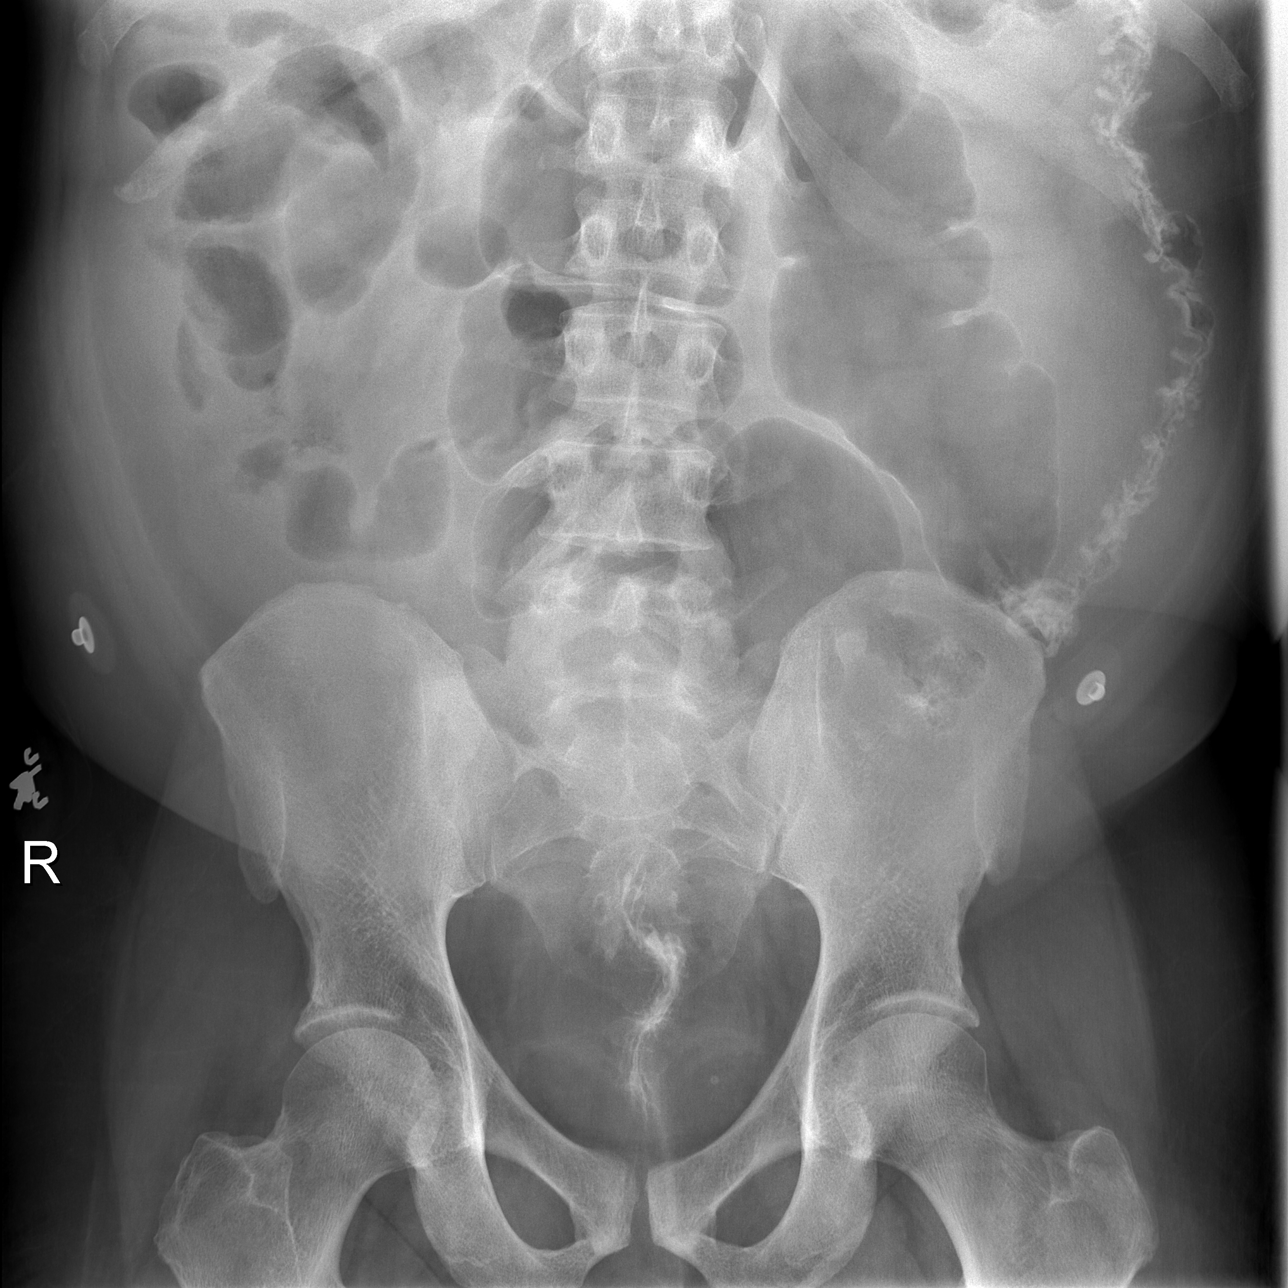

[3 of 3 positions shown; findings below may reference images not displayed]

FINDINGS: The enteric contrast material from [REDACTED] has progressed to the
descending colon and rectum. Gaseous distension of the sigmoid colon
is noted. No free air. No dilated loops of small bowel noted.
IMPRESSION: 1. Continued antegrade progression of enteric contrast material up
to the level of the rectum without evidence for bowel obstruction.
